# Patient Record
Sex: Female | Born: 1935 | Race: White | Hispanic: No | Smoking: Former smoker
Health system: Southern US, Community
[De-identification: ages and names within clinical notes are randomized; demographics above are authoritative.]

## PROBLEM LIST (undated history)

## (undated) DIAGNOSIS — C801 Malignant (primary) neoplasm, unspecified: Secondary | ICD-10-CM

## (undated) DIAGNOSIS — M1990 Unspecified osteoarthritis, unspecified site: Secondary | ICD-10-CM

## (undated) DIAGNOSIS — K219 Gastro-esophageal reflux disease without esophagitis: Secondary | ICD-10-CM

## (undated) DIAGNOSIS — J449 Chronic obstructive pulmonary disease, unspecified: Secondary | ICD-10-CM

## (undated) DIAGNOSIS — T4145XA Adverse effect of unspecified anesthetic, initial encounter: Secondary | ICD-10-CM

## (undated) DIAGNOSIS — D649 Anemia, unspecified: Secondary | ICD-10-CM

## (undated) DIAGNOSIS — N390 Urinary tract infection, site not specified: Secondary | ICD-10-CM

## (undated) DIAGNOSIS — R51 Headache: Secondary | ICD-10-CM

## (undated) DIAGNOSIS — G709 Myoneural disorder, unspecified: Secondary | ICD-10-CM

## (undated) DIAGNOSIS — IMO0001 Reserved for inherently not codable concepts without codable children: Secondary | ICD-10-CM

## (undated) DIAGNOSIS — F329 Major depressive disorder, single episode, unspecified: Secondary | ICD-10-CM

## (undated) DIAGNOSIS — E049 Nontoxic goiter, unspecified: Secondary | ICD-10-CM

## (undated) DIAGNOSIS — K529 Noninfective gastroenteritis and colitis, unspecified: Secondary | ICD-10-CM

## (undated) DIAGNOSIS — G629 Polyneuropathy, unspecified: Secondary | ICD-10-CM

## (undated) DIAGNOSIS — F32A Depression, unspecified: Secondary | ICD-10-CM

## (undated) DIAGNOSIS — Z9289 Personal history of other medical treatment: Secondary | ICD-10-CM

## (undated) DIAGNOSIS — I10 Essential (primary) hypertension: Secondary | ICD-10-CM

## (undated) DIAGNOSIS — T8859XA Other complications of anesthesia, initial encounter: Secondary | ICD-10-CM

## (undated) HISTORY — PX: JOINT REPLACEMENT: SHX530

## (undated) HISTORY — PX: OTHER SURGICAL HISTORY: SHX169

## (undated) HISTORY — DX: Malignant (primary) neoplasm, unspecified: C80.1

## (undated) HISTORY — PX: APPENDECTOMY: SHX54

## (undated) HISTORY — PX: BLADDER SURGERY: SHX569

## (undated) HISTORY — PX: ABDOMINAL HYSTERECTOMY: SHX81

## (undated) HISTORY — PX: TOTAL KNEE ARTHROPLASTY: SHX125

---

## 1999-01-31 ENCOUNTER — Encounter: Payer: Self-pay | Admitting: Urology

## 1999-01-31 ENCOUNTER — Encounter: Admission: RE | Admit: 1999-01-31 | Discharge: 1999-01-31 | Payer: Self-pay | Admitting: Urology

## 1999-02-04 ENCOUNTER — Encounter: Admission: RE | Admit: 1999-02-04 | Discharge: 1999-02-04 | Payer: Self-pay | Admitting: Urology

## 1999-02-04 ENCOUNTER — Encounter: Payer: Self-pay | Admitting: Urology

## 1999-04-14 ENCOUNTER — Encounter: Payer: Self-pay | Admitting: Urology

## 1999-04-14 ENCOUNTER — Emergency Department (HOSPITAL_COMMUNITY): Admission: EM | Admit: 1999-04-14 | Discharge: 1999-04-14 | Payer: Self-pay | Admitting: Internal Medicine

## 1999-04-14 ENCOUNTER — Encounter: Payer: Self-pay | Admitting: Internal Medicine

## 1999-04-16 ENCOUNTER — Ambulatory Visit (HOSPITAL_COMMUNITY): Admission: RE | Admit: 1999-04-16 | Discharge: 1999-04-16 | Payer: Self-pay | Admitting: Urology

## 1999-04-16 ENCOUNTER — Encounter: Payer: Self-pay | Admitting: Urology

## 1999-04-18 ENCOUNTER — Encounter (INDEPENDENT_AMBULATORY_CARE_PROVIDER_SITE_OTHER): Payer: Self-pay

## 1999-04-18 ENCOUNTER — Observation Stay (HOSPITAL_COMMUNITY): Admission: RE | Admit: 1999-04-18 | Discharge: 1999-04-20 | Payer: Self-pay | Admitting: Urology

## 1999-05-02 ENCOUNTER — Encounter: Payer: Self-pay | Admitting: Urology

## 1999-05-02 ENCOUNTER — Encounter: Admission: RE | Admit: 1999-05-02 | Discharge: 1999-05-02 | Payer: Self-pay | Admitting: Urology

## 2000-03-13 ENCOUNTER — Ambulatory Visit (HOSPITAL_BASED_OUTPATIENT_CLINIC_OR_DEPARTMENT_OTHER): Admission: RE | Admit: 2000-03-13 | Discharge: 2000-03-13 | Payer: Self-pay | Admitting: Plastic Surgery

## 2000-06-12 ENCOUNTER — Encounter (INDEPENDENT_AMBULATORY_CARE_PROVIDER_SITE_OTHER): Payer: Self-pay | Admitting: *Deleted

## 2000-06-12 ENCOUNTER — Ambulatory Visit (HOSPITAL_BASED_OUTPATIENT_CLINIC_OR_DEPARTMENT_OTHER): Admission: RE | Admit: 2000-06-12 | Discharge: 2000-06-12 | Payer: Self-pay | Admitting: Plastic Surgery

## 2000-11-02 ENCOUNTER — Encounter: Payer: Self-pay | Admitting: Internal Medicine

## 2000-11-02 ENCOUNTER — Encounter: Admission: RE | Admit: 2000-11-02 | Discharge: 2000-11-02 | Payer: Self-pay | Admitting: Internal Medicine

## 2000-11-15 ENCOUNTER — Ambulatory Visit (HOSPITAL_COMMUNITY): Admission: RE | Admit: 2000-11-15 | Discharge: 2000-11-15 | Payer: Self-pay | Admitting: Gastroenterology

## 2000-11-15 ENCOUNTER — Encounter: Payer: Self-pay | Admitting: Gastroenterology

## 2001-03-03 ENCOUNTER — Inpatient Hospital Stay (HOSPITAL_COMMUNITY): Admission: EM | Admit: 2001-03-03 | Discharge: 2001-03-08 | Payer: Self-pay | Admitting: Emergency Medicine

## 2001-05-28 ENCOUNTER — Encounter: Payer: Self-pay | Admitting: Orthopedic Surgery

## 2001-05-28 ENCOUNTER — Encounter: Admission: RE | Admit: 2001-05-28 | Discharge: 2001-05-28 | Payer: Self-pay | Admitting: Orthopedic Surgery

## 2001-07-05 ENCOUNTER — Inpatient Hospital Stay (HOSPITAL_COMMUNITY): Admission: EM | Admit: 2001-07-05 | Discharge: 2001-07-09 | Payer: Self-pay

## 2001-07-22 ENCOUNTER — Encounter: Admission: RE | Admit: 2001-07-22 | Discharge: 2001-07-22 | Payer: Self-pay | Admitting: General Surgery

## 2001-07-22 ENCOUNTER — Encounter: Payer: Self-pay | Admitting: General Surgery

## 2001-07-22 ENCOUNTER — Encounter: Payer: Self-pay | Admitting: Internal Medicine

## 2001-07-22 ENCOUNTER — Encounter: Admission: RE | Admit: 2001-07-22 | Discharge: 2001-07-22 | Payer: Self-pay | Admitting: Internal Medicine

## 2001-08-09 ENCOUNTER — Encounter: Payer: Self-pay | Admitting: Orthopedic Surgery

## 2001-08-09 ENCOUNTER — Encounter: Admission: RE | Admit: 2001-08-09 | Discharge: 2001-08-09 | Payer: Self-pay | Admitting: Orthopedic Surgery

## 2001-11-05 ENCOUNTER — Ambulatory Visit (HOSPITAL_COMMUNITY): Admission: RE | Admit: 2001-11-05 | Discharge: 2001-11-05 | Payer: Self-pay | Admitting: Internal Medicine

## 2001-11-05 ENCOUNTER — Encounter: Payer: Self-pay | Admitting: Internal Medicine

## 2001-11-22 ENCOUNTER — Encounter: Payer: Self-pay | Admitting: Orthopedic Surgery

## 2001-11-22 ENCOUNTER — Encounter: Admission: RE | Admit: 2001-11-22 | Discharge: 2001-11-22 | Payer: Self-pay | Admitting: Orthopedic Surgery

## 2002-02-14 ENCOUNTER — Encounter: Admission: RE | Admit: 2002-02-14 | Discharge: 2002-02-14 | Payer: Self-pay | Admitting: Orthopedic Surgery

## 2002-02-14 ENCOUNTER — Encounter: Payer: Self-pay | Admitting: Orthopedic Surgery

## 2002-03-10 ENCOUNTER — Encounter: Payer: Self-pay | Admitting: Gastroenterology

## 2002-03-10 ENCOUNTER — Encounter: Admission: RE | Admit: 2002-03-10 | Discharge: 2002-03-10 | Payer: Self-pay | Admitting: Gastroenterology

## 2002-03-12 ENCOUNTER — Ambulatory Visit (HOSPITAL_COMMUNITY): Admission: RE | Admit: 2002-03-12 | Discharge: 2002-03-12 | Payer: Self-pay | Admitting: Gastroenterology

## 2002-03-14 ENCOUNTER — Encounter: Payer: Self-pay | Admitting: Gastroenterology

## 2002-03-14 ENCOUNTER — Encounter: Admission: RE | Admit: 2002-03-14 | Discharge: 2002-03-14 | Payer: Self-pay | Admitting: Gastroenterology

## 2002-04-01 ENCOUNTER — Encounter: Payer: Self-pay | Admitting: Orthopedic Surgery

## 2002-04-01 ENCOUNTER — Encounter: Admission: RE | Admit: 2002-04-01 | Discharge: 2002-04-01 | Payer: Self-pay | Admitting: Orthopedic Surgery

## 2002-04-08 ENCOUNTER — Encounter (INDEPENDENT_AMBULATORY_CARE_PROVIDER_SITE_OTHER): Payer: Self-pay | Admitting: *Deleted

## 2002-04-08 ENCOUNTER — Inpatient Hospital Stay (HOSPITAL_COMMUNITY): Admission: RE | Admit: 2002-04-08 | Discharge: 2002-04-18 | Payer: Self-pay | Admitting: General Surgery

## 2002-07-11 ENCOUNTER — Encounter: Payer: Self-pay | Admitting: Orthopedic Surgery

## 2002-07-11 ENCOUNTER — Encounter: Admission: RE | Admit: 2002-07-11 | Discharge: 2002-07-11 | Payer: Self-pay | Admitting: Orthopedic Surgery

## 2002-07-24 ENCOUNTER — Other Ambulatory Visit: Admission: RE | Admit: 2002-07-24 | Discharge: 2002-07-24 | Payer: Self-pay | Admitting: Obstetrics and Gynecology

## 2002-10-15 ENCOUNTER — Encounter: Admission: RE | Admit: 2002-10-15 | Discharge: 2002-10-15 | Payer: Self-pay | Admitting: Orthopedic Surgery

## 2002-10-15 ENCOUNTER — Encounter: Payer: Self-pay | Admitting: Orthopedic Surgery

## 2003-02-10 ENCOUNTER — Encounter: Admission: RE | Admit: 2003-02-10 | Discharge: 2003-02-10 | Payer: Self-pay | Admitting: Internal Medicine

## 2003-02-25 ENCOUNTER — Inpatient Hospital Stay (HOSPITAL_COMMUNITY): Admission: RE | Admit: 2003-02-25 | Discharge: 2003-02-28 | Payer: Self-pay | Admitting: Orthopedic Surgery

## 2003-02-28 ENCOUNTER — Inpatient Hospital Stay (HOSPITAL_COMMUNITY)
Admission: RE | Admit: 2003-02-28 | Discharge: 2003-03-06 | Payer: Self-pay | Admitting: Physical Medicine & Rehabilitation

## 2003-08-12 ENCOUNTER — Encounter: Admission: RE | Admit: 2003-08-12 | Discharge: 2003-08-12 | Payer: Self-pay | Admitting: Internal Medicine

## 2003-09-04 ENCOUNTER — Other Ambulatory Visit: Admission: RE | Admit: 2003-09-04 | Discharge: 2003-09-04 | Payer: Self-pay | Admitting: Obstetrics and Gynecology

## 2003-09-29 ENCOUNTER — Ambulatory Visit (HOSPITAL_COMMUNITY): Admission: RE | Admit: 2003-09-29 | Discharge: 2003-09-29 | Payer: Self-pay | Admitting: Plastic Surgery

## 2003-09-29 ENCOUNTER — Encounter (INDEPENDENT_AMBULATORY_CARE_PROVIDER_SITE_OTHER): Payer: Self-pay | Admitting: Plastic Surgery

## 2003-09-29 ENCOUNTER — Ambulatory Visit (HOSPITAL_BASED_OUTPATIENT_CLINIC_OR_DEPARTMENT_OTHER): Admission: RE | Admit: 2003-09-29 | Discharge: 2003-09-29 | Payer: Self-pay | Admitting: Plastic Surgery

## 2003-10-16 ENCOUNTER — Encounter: Admission: RE | Admit: 2003-10-16 | Discharge: 2003-10-16 | Payer: Self-pay | Admitting: Internal Medicine

## 2003-11-11 ENCOUNTER — Ambulatory Visit: Payer: Self-pay | Admitting: Orthopedic Surgery

## 2003-11-11 ENCOUNTER — Inpatient Hospital Stay (HOSPITAL_COMMUNITY): Admission: RE | Admit: 2003-11-11 | Discharge: 2003-11-14 | Payer: Self-pay | Admitting: Orthopedic Surgery

## 2003-11-14 ENCOUNTER — Inpatient Hospital Stay (HOSPITAL_COMMUNITY): Admission: RE | Admit: 2003-11-14 | Discharge: 2003-11-19 | Payer: Self-pay | Admitting: Orthopedic Surgery

## 2003-11-14 ENCOUNTER — Ambulatory Visit: Payer: Self-pay | Admitting: Physical Medicine & Rehabilitation

## 2004-02-28 DIAGNOSIS — Z9289 Personal history of other medical treatment: Secondary | ICD-10-CM

## 2004-02-28 HISTORY — DX: Personal history of other medical treatment: Z92.89

## 2004-02-28 HISTORY — PX: COLON SURGERY: SHX602

## 2004-03-07 ENCOUNTER — Encounter: Admission: RE | Admit: 2004-03-07 | Discharge: 2004-03-07 | Payer: Self-pay | Admitting: Obstetrics and Gynecology

## 2004-09-14 ENCOUNTER — Other Ambulatory Visit: Admission: RE | Admit: 2004-09-14 | Discharge: 2004-09-14 | Payer: Self-pay | Admitting: Obstetrics and Gynecology

## 2005-03-31 IMAGING — CT CT PELVIS W/ CM
1 of 3 series · 14 of 32 positions shown, 19 images · IV contrast (g+ & 100 mlomni 300)
Comparison: none

CLINICAL DATA: Elevated LFTs.  History of total colectomy and hysterectomy.  New diagnosis of melanoma. 
 CT ABDOMEN WITH CONTRAST 
 Multidetector helical scans through the abdomen were performed after oral and IV contrast media were given.  100 cc of Omnipaque 300 were given as the contrast media.  
 The lung bases are clear.  The liver enhances normally with no focal abnormality, and no ductal dilatation is seen.  No calcified gallstones are noted. The pancreas are normal in size, and the peripancreatic fat planes are normal.  The adrenal glands and spleen appear normal.  The kidneys enhance normally, with the right kidney being more ptotic in position.  On delayed images, the pelvocaliceal systems are normal and the proximal ureters are normal in caliber.  Abdominal aorta is normal and no adenopathy is seen.
 IMPRESSION
 Negative CT of the abdomen.  
 CT PELVIS WITH CONTRAST 
 Scans were continued through the pelvis after oral and IV contrast media were given. The urinary bladder is decompressed and unremarkable.  Linear artifacts across the pelvis are created by the left total hip replacement obscuring detail.  The patient has previously   undergone hysterectomy and no adnexal lesion is seen.  No fluid is noted within the pelvis.  There are degenerative changes in the lower lumbar spine. 
 Negative CT of the pelvis.  No acute abnormality.  Left total hip replacement does create linear artifacts across the pelvis.   The patient has undergone prior colectomy.

[Series 2: — · axial · 0.70mm/px · z∈[-327,+13]mm · 14 of 78 slices shown, 19 images]
[im 5/78  soft-tissue]
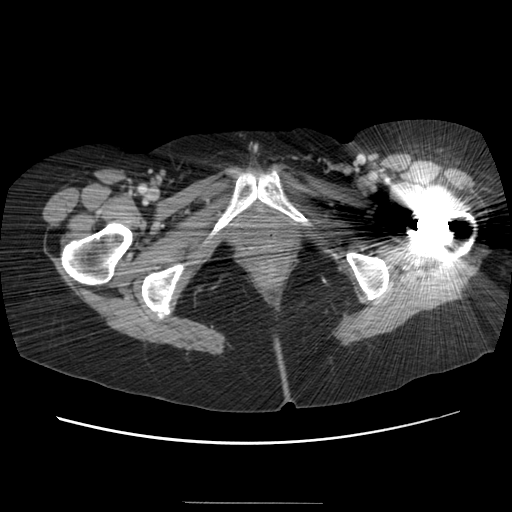
[im 5/78  bone]
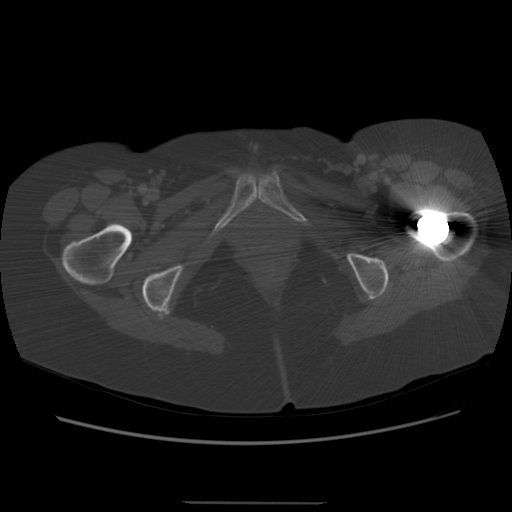
[im 13/78  soft-tissue]
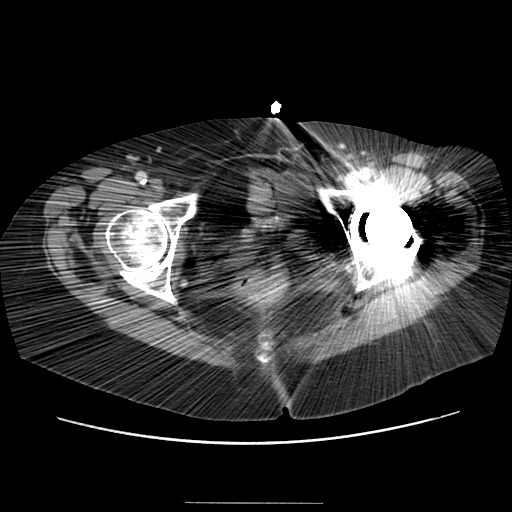
[im 17/78  soft-tissue]
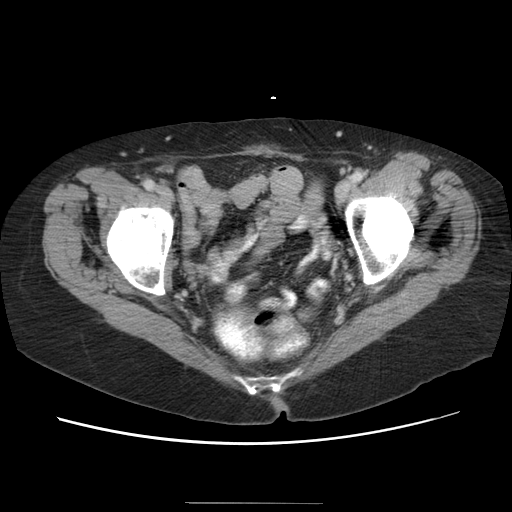
[im 21/78  soft-tissue]
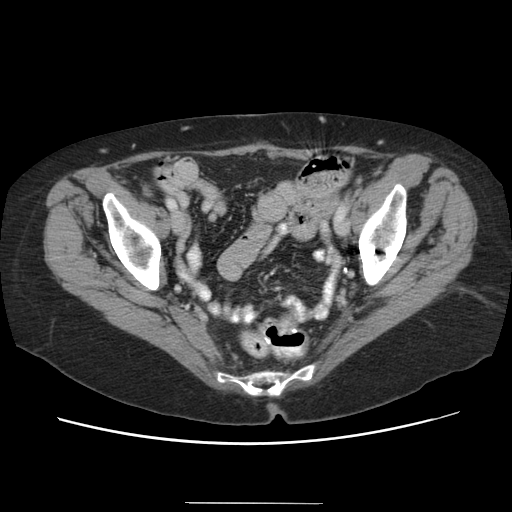
[im 29/78  soft-tissue]
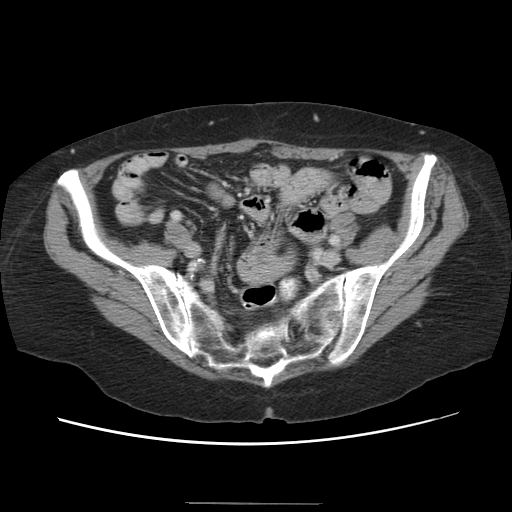
[im 33/78  soft-tissue]
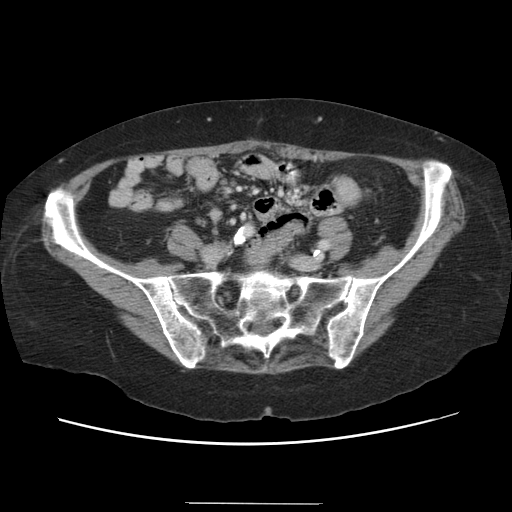
[im 41/78  soft-tissue]
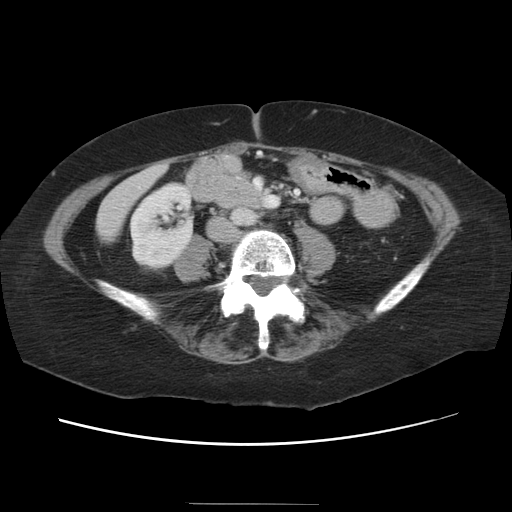
[im 45/78  soft-tissue]
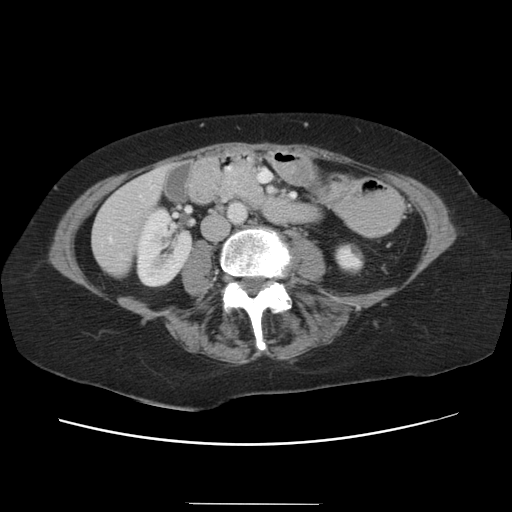
[im 49/78  soft-tissue]
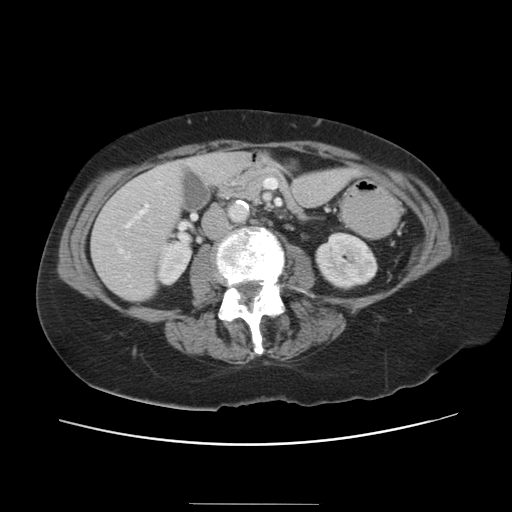
[im 49/78  bone]
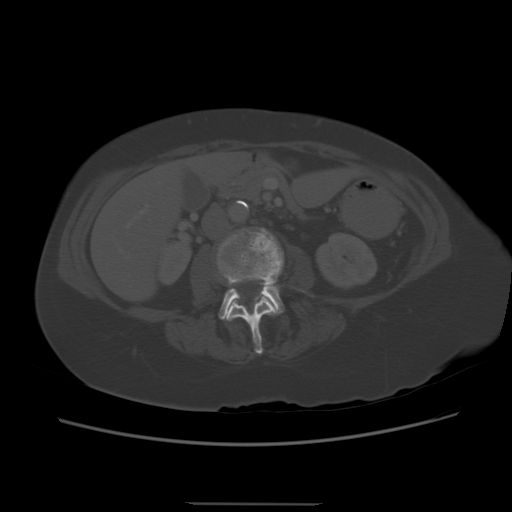
[im 57/78  soft-tissue]
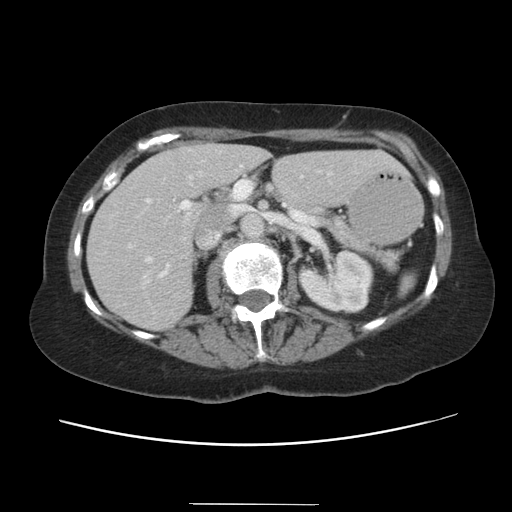
[im 61/78  soft-tissue]
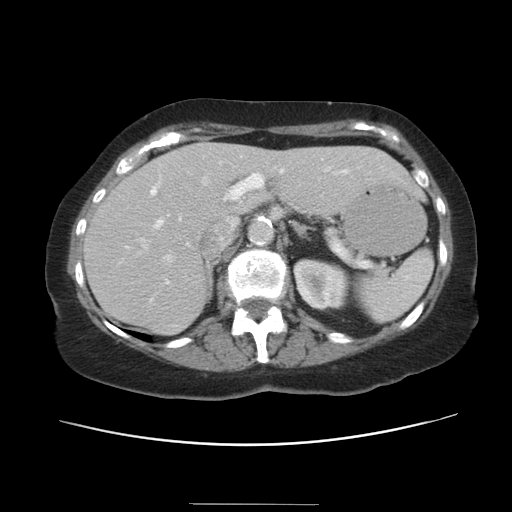
[im 61/78  lung]
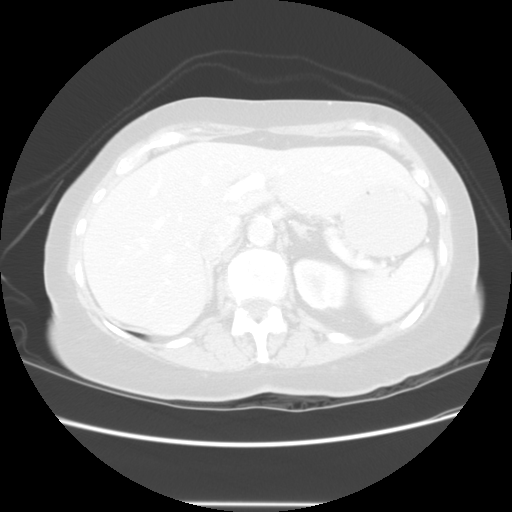
[im 65/78  soft-tissue]
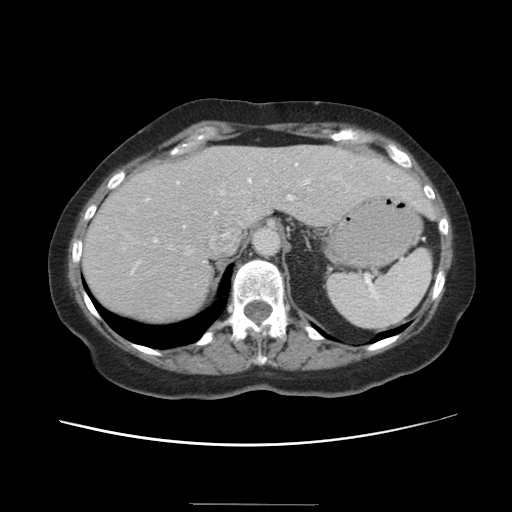
[im 65/78  lung]
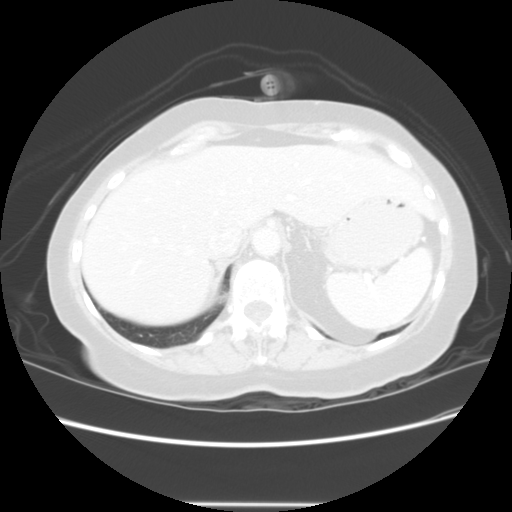
[im 69/78  lung]
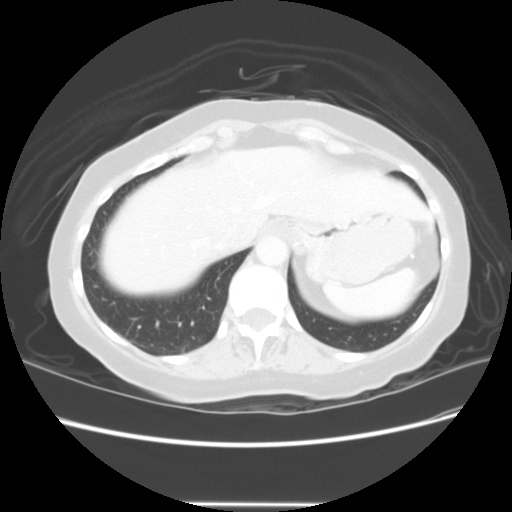
[im 73/78  soft-tissue]
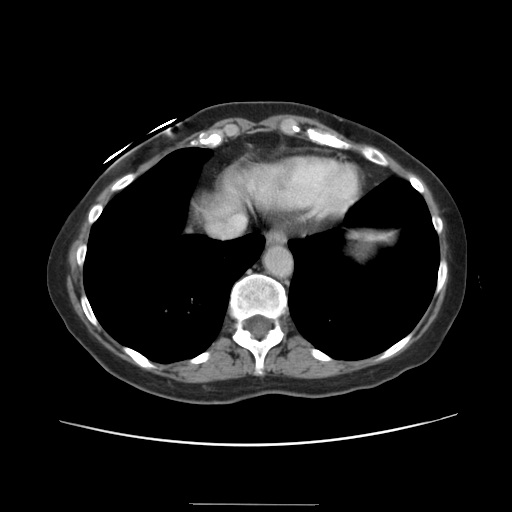
[im 73/78  lung]
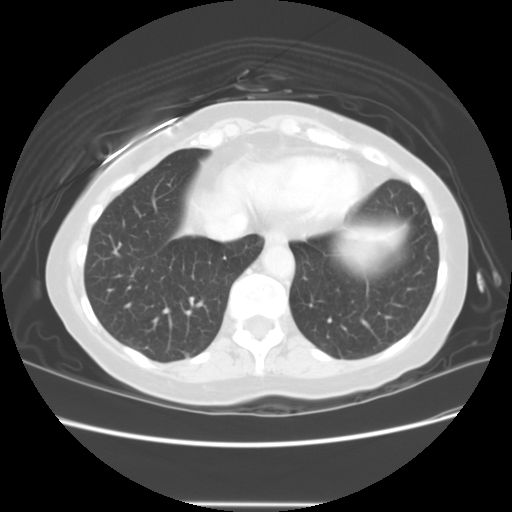

[14 of 32 positions shown; findings below may reference images not displayed]

## 2005-04-17 ENCOUNTER — Encounter: Admission: RE | Admit: 2005-04-17 | Discharge: 2005-04-17 | Payer: Self-pay | Admitting: Obstetrics and Gynecology

## 2005-04-24 ENCOUNTER — Inpatient Hospital Stay (HOSPITAL_COMMUNITY): Admission: RE | Admit: 2005-04-24 | Discharge: 2005-04-27 | Payer: Self-pay | Admitting: Orthopedic Surgery

## 2005-04-25 ENCOUNTER — Ambulatory Visit: Payer: Self-pay | Admitting: Physical Medicine & Rehabilitation

## 2005-04-27 ENCOUNTER — Ambulatory Visit: Payer: Self-pay | Admitting: Physical Medicine & Rehabilitation

## 2005-04-27 ENCOUNTER — Inpatient Hospital Stay
Admission: RE | Admit: 2005-04-27 | Discharge: 2005-05-05 | Payer: Self-pay | Admitting: Physical Medicine & Rehabilitation

## 2006-04-18 ENCOUNTER — Encounter: Admission: RE | Admit: 2006-04-18 | Discharge: 2006-04-18 | Payer: Self-pay | Admitting: Obstetrics and Gynecology

## 2006-05-02 ENCOUNTER — Encounter: Admission: RE | Admit: 2006-05-02 | Discharge: 2006-05-02 | Payer: Self-pay | Admitting: Obstetrics and Gynecology

## 2006-09-19 ENCOUNTER — Other Ambulatory Visit: Admission: RE | Admit: 2006-09-19 | Discharge: 2006-09-19 | Payer: Self-pay | Admitting: Obstetrics and Gynecology

## 2006-12-05 ENCOUNTER — Encounter: Admission: RE | Admit: 2006-12-05 | Discharge: 2006-12-05 | Payer: Self-pay | Admitting: Allergy and Immunology

## 2007-04-18 ENCOUNTER — Ambulatory Visit: Payer: Self-pay | Admitting: Psychiatry

## 2007-05-01 ENCOUNTER — Ambulatory Visit: Payer: Self-pay | Admitting: Psychiatry

## 2007-05-09 ENCOUNTER — Ambulatory Visit: Payer: Self-pay | Admitting: Psychiatry

## 2007-05-14 ENCOUNTER — Ambulatory Visit: Payer: Self-pay | Admitting: Psychiatry

## 2007-06-03 ENCOUNTER — Ambulatory Visit: Payer: Self-pay | Admitting: Psychiatry

## 2007-06-10 ENCOUNTER — Ambulatory Visit: Payer: Self-pay | Admitting: Psychiatry

## 2007-06-12 ENCOUNTER — Encounter: Admission: RE | Admit: 2007-06-12 | Discharge: 2007-06-12 | Payer: Self-pay | Admitting: Obstetrics and Gynecology

## 2007-06-24 ENCOUNTER — Ambulatory Visit: Payer: Self-pay | Admitting: Psychiatry

## 2007-07-09 ENCOUNTER — Ambulatory Visit: Payer: Self-pay | Admitting: Psychiatry

## 2007-08-01 ENCOUNTER — Ambulatory Visit: Payer: Self-pay | Admitting: Psychiatry

## 2007-10-02 ENCOUNTER — Ambulatory Visit: Payer: Self-pay | Admitting: Psychiatry

## 2007-10-07 ENCOUNTER — Ambulatory Visit (HOSPITAL_COMMUNITY): Admission: RE | Admit: 2007-10-07 | Discharge: 2007-10-07 | Payer: Self-pay | Admitting: Dermatology

## 2007-10-07 ENCOUNTER — Ambulatory Visit (HOSPITAL_COMMUNITY): Admission: RE | Admit: 2007-10-07 | Discharge: 2007-10-07 | Payer: Self-pay | Admitting: Obstetrics and Gynecology

## 2008-06-12 ENCOUNTER — Encounter: Admission: RE | Admit: 2008-06-12 | Discharge: 2008-06-12 | Payer: Self-pay | Admitting: Obstetrics and Gynecology

## 2008-09-24 ENCOUNTER — Other Ambulatory Visit: Admission: RE | Admit: 2008-09-24 | Discharge: 2008-09-24 | Payer: Self-pay | Admitting: Obstetrics and Gynecology

## 2009-03-31 ENCOUNTER — Encounter: Admission: RE | Admit: 2009-03-31 | Discharge: 2009-03-31 | Payer: Self-pay | Admitting: Internal Medicine

## 2009-04-07 ENCOUNTER — Other Ambulatory Visit: Admission: RE | Admit: 2009-04-07 | Discharge: 2009-04-07 | Payer: Self-pay | Admitting: Interventional Radiology

## 2009-04-07 ENCOUNTER — Encounter: Admission: RE | Admit: 2009-04-07 | Discharge: 2009-04-07 | Payer: Self-pay | Admitting: Internal Medicine

## 2009-05-06 ENCOUNTER — Encounter (HOSPITAL_COMMUNITY): Admission: RE | Admit: 2009-05-06 | Discharge: 2009-06-29 | Payer: Self-pay | Admitting: Internal Medicine

## 2009-06-02 ENCOUNTER — Encounter: Admission: RE | Admit: 2009-06-02 | Discharge: 2009-06-02 | Payer: Self-pay | Admitting: Obstetrics and Gynecology

## 2009-06-07 ENCOUNTER — Inpatient Hospital Stay (HOSPITAL_COMMUNITY): Admission: RE | Admit: 2009-06-07 | Discharge: 2009-06-10 | Payer: Self-pay | Admitting: Orthopedic Surgery

## 2010-03-20 ENCOUNTER — Encounter: Payer: Self-pay | Admitting: Obstetrics and Gynecology

## 2010-04-11 ENCOUNTER — Other Ambulatory Visit: Payer: Self-pay | Admitting: Dermatology

## 2010-05-03 ENCOUNTER — Other Ambulatory Visit: Payer: Self-pay | Admitting: Internal Medicine

## 2010-05-03 DIAGNOSIS — Z1239 Encounter for other screening for malignant neoplasm of breast: Secondary | ICD-10-CM

## 2010-05-18 LAB — PROTIME-INR
INR: 1.06 (ref 0.00–1.49)
Prothrombin Time: 13.7 seconds (ref 11.6–15.2)
Prothrombin Time: 17.8 seconds — ABNORMAL HIGH (ref 11.6–15.2)

## 2010-05-18 LAB — BASIC METABOLIC PANEL
BUN: 12 mg/dL (ref 6–23)
CO2: 27 mEq/L (ref 19–32)
CO2: 30 mEq/L (ref 19–32)
Calcium: 8.6 mg/dL (ref 8.4–10.5)
Chloride: 106 mEq/L (ref 96–112)
Chloride: 107 mEq/L (ref 96–112)
Creatinine, Ser: 0.77 mg/dL (ref 0.4–1.2)
Creatinine, Ser: 0.85 mg/dL (ref 0.4–1.2)
GFR calc Af Amer: >60 mL/min (ref >60)
GFR calc non Af Amer: >60 mL/min (ref >60)
GFR calc non Af Amer: >60 mL/min (ref >60)
Glucose, Bld: 113 mg/dL — ABNORMAL HIGH (ref 70–99)
Potassium: 4.2 mEq/L (ref 3.5–5.1)
Sodium: 140 mEq/L (ref 135–145)

## 2010-05-18 LAB — CBC
HCT: 31.5 % — ABNORMAL LOW (ref 36.0–46.0)
Hemoglobin: 10.5 g/dL — ABNORMAL LOW (ref 12.0–15.0)
MCHC: 33.5 g/dL (ref 30.0–36.0)
MCHC: 33.8 g/dL (ref 30.0–36.0)
MCV: 93.9 fL (ref 78.0–100.0)
MCV: 94.4 fL (ref 78.0–100.0)
Platelets: 242 10*3/uL (ref 150–400)
RBC: 4.3 MIL/uL (ref 3.87–5.11)
RDW: 13.5 % (ref 11.5–15.5)
RDW: 13.8 % (ref 11.5–15.5)
RDW: 14.2 % (ref 11.5–15.5)
WBC: 8.2 10*3/uL (ref 4.0–10.5)

## 2010-05-18 LAB — URINE CULTURE: Colony Count: 15000

## 2010-05-18 LAB — COMPREHENSIVE METABOLIC PANEL
AST: 20 U/L (ref 0–37)
BUN: 20 mg/dL (ref 6–23)
CO2: 29 mEq/L (ref 19–32)
Chloride: 103 mEq/L (ref 96–112)
Creatinine, Ser: 1.17 mg/dL (ref 0.4–1.2)
GFR calc non Af Amer: 45 mL/min — ABNORMAL LOW (ref >60)
Glucose, Bld: 100 mg/dL — ABNORMAL HIGH (ref 70–99)
Potassium: 4 mEq/L (ref 3.5–5.1)
Sodium: 143 mEq/L (ref 135–145)
Total Protein: 7.8 g/dL (ref 6.0–8.3)

## 2010-05-18 LAB — URINALYSIS, ROUTINE W REFLEX MICROSCOPIC
Bilirubin Urine: NEGATIVE
Bilirubin Urine: NEGATIVE
Glucose, UA: NEGATIVE mg/dL
Ketones, ur: NEGATIVE mg/dL
Nitrite: NEGATIVE
Specific Gravity, Urine: 1.017 (ref 1.005–1.030)
Urobilinogen, UA: 0.2 mg/dL (ref 0.0–1.0)
pH: 7 (ref 5.0–8.0)

## 2010-05-18 LAB — URINE MICROSCOPIC-ADD ON

## 2010-06-02 ENCOUNTER — Other Ambulatory Visit: Payer: Self-pay | Admitting: Dermatology

## 2010-06-06 ENCOUNTER — Ambulatory Visit
Admission: RE | Admit: 2010-06-06 | Discharge: 2010-06-06 | Disposition: A | Discharge status: Home / Self Care | Payer: Medicare Other | Source: Ambulatory Visit | Attending: Internal Medicine | Admitting: Internal Medicine

## 2010-06-06 DIAGNOSIS — Z1239 Encounter for other screening for malignant neoplasm of breast: Secondary | ICD-10-CM

## 2010-07-15 NOTE — Discharge Summary (Signed)
[name], [name]                             ACCOUNT NO.:  [id number]   MEDICAL RECORD NO.:  [id number]                   PATIENT TYPE:  IPS   LOCATION:  4142                                 FACILITY:  [facility]   PHYSICIAN:  Ranelle Oyster, M.D.             DATE OF BIRTH:  04/01/1930   DATE OF ADMISSION:  02/28/2003  DATE OF DISCHARGE:  03/06/2003                                 DISCHARGE SUMMARY   DISCHARGE DIAGNOSIS:  1. Left total hip replacement secondary to osteoarthritis.  2. Deep vein thrombosis prophylaxis.  3. __________  total cholesterol.  4. Hypokalemia, resolved.  5. __________ .  6. Hypertension.  7. __________   HISTORY OF PRESENT ILLNESS:  The patient is a 77 year old white female with  past medical history of hypertension, admitted on December 29 for left total  hip replacement secondary to OA, which was nonresponsive to conservative  care.  A total hip replacement was performed by Dr. Ollen Gross, and the  patient __________  for DVT prophylaxis.  PT report at this time indicates  that the patient is ambulating 50% partial weightbearing, four steps,  moderate assist for transfers and bed mobility moderate assist.  Hospital  course with anemia and hypokalemia.  The patient was transferred to the  [facility] rehab department on February 28, 2003.   PAST MEDICAL HISTORY:  As above.  Diverticulosis, diverticulitis, __________  , cystocele.   PAST SURGICAL HISTORY:  Hysterectomy, appendectomy, tonsillectomy, bladder  surgery.   Medications on admission were:  1. Lomotil 2.5.  2. __________  240 mg daily.  3. Calcium.   ALLERGIES:  SULFA and SEPTRA.   PRIMARY CARE PHYSICIAN:  Ike Bene, M.D.   SOCIAL HISTORY:  The patient lives alone in a 1-1/2 story home, which is a  basement, in [place], [place].  Independent prior to admission.  She denies any alcohol.  Quit tobacco 30 years ago.  She divorced, a retired  Runner, broadcasting/film/video.  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  There are 13  steps in house.   FAMILY HISTORY:  Noncontributory.   REVIEW OF SYSTEMS:  Significant for diarrhea, reflux, and incontinence.   HOSPITAL COURSE:  [name] was admitted to the [facility] rehab  department on February 28, 2003, for comprehensive inpatient rehabilitation  and received more than three hours of therapy daily.  Overall [name]  progressed great during her six-day stay in rehab.  She was discharged at a  modified independent level, able to ambulate greater than 100 feet.  The  patient was able to sustain her partial weightbearing.  Surgical incision  healed very well, demonstrated no signs of infection.  The patient remained  on Coumadin as well as Lovenox until INR was therapeutic with DVT  prophylaxis.  No significant bleeding complications noted.  Blood pressure  remained controlled with Cardizem.  Hospital  course significant for muscle  spasms, pain management, hypokalemia.  Initially the patient was on 20 mEq  p.o. daily for hypokalemia.  Potassium level did begin to normalize, and  Cardura was discontinued.  Initially we had problems with patient's pain  medications.  First she was on Vicodin as well as Darvocet and Ultram.  Adjustments are being made accordingly.  Initially the patient was placed on  Ultram only at night.  She preferred to have it at __________ , and she  continued to take Darvocet-N 100 q.6-8h. p.r.n. as well.  The patient had  been having __________  at home by having diarrhea.  She was taking Lomotil  one tablet t.i.d. prior to breakfast, lunch, and dinner, and diarrhea did  improve.  Due to diarrhea, the patient continued to have rectal irritation.  She received Tucks Pad as needed for irritation.  Finally on January 4,  75,  due to still complications of pain management, Darvocet was  discontinued and she was placed back on oxycodone.  The patient's also  Robaxin was discontinued and she received  Flexeril 5 mg t.i.d.  Finally pain  was under better control with oxycodone and she received K-pad in the  posterior of the left thigh as needed for pain.  Once pain was under  control, the patient progressed very well in rehab.  She was discharged at a  modified independent level.  There were no other major issues that occurred  while the patient was in rehab.   Latest labs:  The latest hemoglobin was 10.8, hematocrit 32.4, white blood  cell count 7.0, platelet count 340.  Latest INR was 2.4.  Sodium 138,  potassium 4.3, chloride 103, CO2 29, glucose 101, BUN 19, creatinine 0.7.  AST 24, ALT 23.  She had a urine culture performed on March 01, 2003.  The  urine grew 40,000 colonies Staph species.  At the time of discharge the  incision showed no signs of infection.  All vitals were stable.  PT report  indicated the patient able to ambulate 150 feet modified independent with  rolling walker, able to transfer sit to stand modified independent, bed  mobility modified independent, able to perform all ADLs at the modified  independent level.  All in all, she made all goals.  She was discharged from  rehab at a modified independent level.   DISCHARGE MEDICATIONS:  1. Resume Lomotil.  2. __________  240 mg in the a.m.  3. Flexeril 5 mg three times daily as needed.  4. Coumadin one-half p.o. in the evening until January 29.  5. Oxycodone 5-10 mg one to two tablets every four to six hours as needed.  6. Multivitamin daily.  7. No aspirin, ibuprofen, Aleve while on Coumadin.   PAIN MANAGEMENT:  Ultram and oxycodone.   She is partial weightbearing on left hip with safety precautions.  She is to  use walker.  No driving.  No smoking or drinking alcohol.  She is to follow  up with Dr. Lequita Halt in two weeks, call for an appointment.  Follow up with  Dr. Riley Kill as needed.  Follow up with primary care [name] in four to six  weeks to check hemoglobin.     Drucilla Schmidt, P.A.                          Ranelle Oyster, M.D.    LB/MEDQ  D:  03/30/2003  T:  03/30/2003  Job:  [id number]   cc:   Ollen Gross, M.D.  [facility]  [address]  [address]  [place]  [place] [address]  Fax: [phone]   Ike Bene, M.D.  [address]  [place]  [place] [address]  Fax: [phone]

## 2010-07-15 NOTE — Discharge Summary (Signed)
[facility]  Patient:    [name], [name] Visit Number: [id number] MRN: [id number]          Service Type: MED Location: [id number] Attending Physician:  Charna Elizabeth Dictated by:   Anselmo Rod, M.D. Admit Date:  07/04/2001 Discharge Date: 07/09/2001   CC:         Sigmund I. Patsi Sears, M.D.  Adolph Pollack, M.D.  Barbette Hair. Vaughan Basta., M.D.   Discharge Summary  DATE OF BIRTH:  12/15/1930  ADMISSION DIAGNOSES: 1. Rectal bleeding, possibly recurrent diverticular bleed. 2. Long-standing history of diverticulosis with multiple bouts of    diverticulitis. 3. History of chronic constipation. 4. Gastroesophageal reflux disease. 5. Recurrent urinary tract infection. 6. Question of fistulous disease associated with diverticulosis. 7. Arthritis. 8. Hypertension. 9. Seasonal allergies.  DISCHARGE DIAGNOSES: 1. Rectal bleeding, possibly recurrent diverticular bleed. 2. Long-standing history of diverticulosis with multiple bouts of    diverticulitis. 3. History of chronic constipation. 4. Gastroesophageal reflux disease. 5. Recurrent urinary tract infection. 6. Question of fistulous disease associated with diverticulosis. 7. Arthritis. 8. Hypertension. 9. Seasonal allergies.  HOSPITAL COURSE: The patient is a 77 year old white female who has been followed by me on an outpatient basis and was admitted to [facility] for the second time this year on Jul 05, 2001, with recurrent GI bleeding. She has a history of extensive diverticulosis diagnosed by a flexible sigmoidoscopy and a barium enema done in the recent past. In January of this year she had a hospitalization for a similar complaint. She was in her  usual state of health until Jul 03, 2001, when she passed some blood at home. She was seen in the office and she came and the bleeding had stopped, but when she went home the bleeding started again. The patient was advised  to come to the emergency room for a 24-hour admission and possible extension of her stay if her symptoms did not improve.  The patient had no fever or chills, rigors, nausea or vomiting or abdominal pain with her symptoms. She has had a problem with chronic constipation and has tried  multiple remedies, none of which have helped. She is afraid to eat more fiber because she feels this will worsen her diverticular disease and cause recurrent problems. She has also had a UTI which was treated by Dr. Patsi Sears, and fistulous disease has been suspected but has not been demonstrated by any radiological method. The patient has no other genitourinary complaints. Her appetite has been fair. Her weight has been stable.  ALLERGIES:  SULFA.  HOME MEDICATIONS 1. Vioxx. 2. Flonase. 3. Ogen. 4. Diltiazem.  SOCIAL HISTORY: She is divorced and has one grown son.  She lives alone in [place]. She moved to [place] from [place] to be with her friend. She denies the use of tobacco or illicit drugs. She drinks an occasional alcoholic beverage.  FAMILY HISTORY: Her mother died of an MI at 74. Her father died of a stroke at 41. Her maternal aunt had breast cancer. No known family history of colon cancer.  HOSPITAL COURSE:  Much against her will, the patient was admitted to the hospital and followed closely with repeat serial CBCs. She did well and did not require a blood transfusion. Protonix was started with regards to her reflux like symptoms. Her initial admission hemoglobin was 12.1 with a BUN of 25 and a creatinine of 0.8. Her hemoglobin then dropped to 10.9 and then 9.7.  The patient was seen by Dr. Claudette Head on May 10 and Jul 07, 2001, in cross cover over the weekend and did well. She was gradually started on a low residue diet.  She was discharged on Jul 09, 2001, when her hemoglobin stabilized at 10.8. The patient did not receive any blood transfusions and was discharged to  home.  DISCHARGE INSTRUCTIONS: 1. Maintain a low residue diet and avoid nuts, seeds and popcorn. 2. Return to the office if there is any history of dizziness, near syncope,    rectal bleeding or melena. 3. Use stool softeners and avoid laxatives as needed for constipation. 4. Liberalized fluid intake. 5. Outpatient follow up within the next seven days. 6. Resume home medications and avoid all nonsteroidals including aspirin. 7. Surgical evaluation with Dr. Avel Peace after discharge. Dictated by:   Anselmo Rod, M.D. Attending Physician:  Charna Elizabeth DD:  07/30/01 TD:  07/31/01 Job: [id number]

## 2010-07-15 NOTE — H&P (Signed)
[facility]  Patient:    [name], [name] Visit Number: [id number] MRN: [id number]          Service Type: EMS Location: ED Attending Physician:  Ilene Qua Dictated by:   Thora Lance, M.D. Admit Date:  03/03/2001                           History and Physical  CHIEF COMPLAINT:              Rectal bleeding.  HISTORY OF PRESENT ILLNESS:   This is a 77 year old white female with a history of hypertension, rhinitis, DJD, diverticulosis, constipation and depression who presents with lower GI bleed.  Prior to Christmas, she developed a rash and diarrhea.  She thought she had diverticulitis and treated herself with five days of Augmentin.  She had some diarrhea while in [place] on December 26 and took some Imodium.  After that, she developed constipation, which has persisted.  Her stools have been hard, pellet-like and difficult to move.  She has been straining quite a bit.  Last night, she had a large meal and, afterwards at 9:00 p.m. while sitting watching TV, she had the sudden onset of bright red blood per rectum mixed with stool.  Throughout the night, she had several large, mainly bloody bowel movements, her last being at 7:20 a.m. this morning.  After that bowel movement, she felt weak and dizzy when standing and sweaty.  She denies any fevers, chills or abdominal pain.  She has no history of any GI bleed in the past.  She did have diverticulitis in the spring of 2002, which was treated at the walk-in clinic on two occasions. She had a colonoscopy to 70 cm by Dr. Loreta Ave in September 2002 which showed extensive left diverticular disease.  As the colonoscopy could not be completed, a barium enema was done and, apparently, showed the same per the patient.  PAST MEDICAL HISTORY:         1. Hypertension, off of medications for several                                  months.                               2. ______ rhinitis and sinusitis.                        3. DJD involving the right knee, left hip,                                  fingers and CMC joints.                               4. Chronic intermittent constipation.                               5. Diverticulosis.                               6. Stress urinary incontinence.  7. History of depression.                               8. History of GERD.                               9. History of dysplastic nevus with moderate to                                  severe atypia excised on three occasions in                                  1999 and 2000.  PAST SURGICAL HISTORY:        1. Total abdominal hysterectomy with bilateral                                  salpingo-oophorectomy and appendectomy for                                  fibroid uterus in November 1977.                               2. Right knee arthroscopy in [place], [place].                               3. Dysplastic nevus with atypia removal, left                                  upper back on three occasions in 1999 and                                  2000 in [place].                               4. Stress urinary incontinence surgery in August                                  2000 by Dr. Christain Sacramento in [place].                               5. Cystourethrocele repair and pubovaginal                                  sling by Dr. Jethro Bolus in February                                  2001.  ALLERGIES:  SULFA.  CURRENT MEDICATIONS:          1. Ogen 0.625 mg q.d.                               2. Vioxx unknown dose q.d.                               3. Flonase, two sprays to each nostril q.d.                               4. Allegra 180 mg q.d. p.r.n.                               5. ______ eye drops p.r.n.                               6. Nasal saline irrigation.  FAMILY HISTORY:               Her father died at the age of 59 of a  stroke. He had hypertension and an enlarged heart.  Her mother died at the age of 53 with a history of CHF, coronary artery disease and, later in life, hypertension, osteoporosis and hypothyroidism.  No siblings.  One son with good health.  SOCIAL HISTORY:               No smoking.  Occasional alcohol.  She lives by herself in [place].  She moved from [place] in 2001.  Divorced.  One son who lives in [place].  REVIEW OF SYSTEMS:            As above.  PHYSICAL EXAMINATION:  GENERAL:                      She appears comfortable lying in the ER.  VITAL SIGNS:                  Blood pressure 123/82.  Orthostatics 146/71 with with heart rate 76 supine, 101/70 with heart rate 110 standing.  Respirations 16, temperature 97.5.  HEENT:                        Pupils are equal, round and respond to light. Extraocular movements intact.  Ears and tympanic membranes are clear. Oropharynx clear.  NECK:                         Supple.  No lymphadenopathy.  No carotid bruits. No thyromegaly.  LUNGS:                        Clear.  HEART:                        Regular rate and rhythm without murmurs, rubs, or gallops.  ABDOMEN:                      Soft and nontender.  No mass or hepatosplenomegaly.  GYN:  Deferred.  RECTAL:                       Gross blood per ER physician.  EXTREMITIES:                  No edema.  NEUROLOGIC:                   Nonfocal.  LABORATORY DATA:              Sodium 134, potassium 3.7, chloride 102, bicarbonate 28, glucose 97, BUN 24, creatinine 0.8, total bilirubin 0.4, alkaline phosphatase 64, SGOT 16, SGPT 17, total protein 6.5, albumin 3.5, calcium 8.9.  PT 12.7, PTT 28.  CBC showed white blood cell count 6.5, hemoglobin 11.4, platelet count 415.  ASSESSMENT:                   Lower gastrointestinal bleeding, likely diverticular.  She has evidence of orthostatic hypotension.  She appears to have stopped bleeding approximately  ten hours ago.  PLAN:                         Admit.  IV fluids.  Clear liquids.  Follow  hemoglobin.  Check C. difficile antigen (recent antibiotics). Dictated by:   Thora Lance, M.D. Attending Physician:  Ilene Qua DD:  03/03/01 TD:  03/03/01 Job: [id number]

## 2010-07-15 NOTE — Op Note (Signed)
[name], [name]                             ACCOUNT NO.:  [id number]   MEDICAL RECORD NO.:  [id number]                   PATIENT TYPE:  INP   LOCATION:  0012                                 FACILITY:  [facility]   PHYSICIAN:  Ollen Gross, M.D.                 DATE OF BIRTH:  1931/02/10   DATE OF PROCEDURE:  02/25/2003  DATE OF DISCHARGE:                                 OPERATIVE REPORT   PREOPERATIVE DIAGNOSIS:  Osteoarthritis, left hip.   POSTOPERATIVE DIAGNOSIS:  Osteoarthritis, left hip.   PROCEDURE:  Left total hip arthroplasty.   SURGEON:  Gus Rankin. Aluisio, M.D.   ASSISTANT:  Alexzandrew L. Julien Girt, P.A.   ANESTHESIA:  Spinal.   ESTIMATED BLOOD LOSS:  200.   DRAIN:  Hemovac x 1.   COMPLICATIONS:  None.   CONDITION:  Stable to recovery.   BRIEF CLINICAL NOTE:  [name] is a 77 year old female, who has end-stage  osteoarthritis of the left hip with pain refractory to nonoperative  management.  She presents now for a left total hip arthroplasty.   PROCEDURE IN DETAIL:  After the successful administration of spinal  anesthetic, the patient is placed in the right lateral decubitus position  with the left side up and held with the hip positioner.  The left lower  extremity is isolated from her perineum with plastic drapes and prepped and  draped in the usual sterile fashion.  Mini posterolateral incision is made  with a 10 blade through subcutaneous tissue to the level of the fascia lata  which is incised in line with the skin incision.  Sciatic nerve is palpated  and protected, and the short external rotator is isolated off the femur.  Capsulectomy is performed and the hip dislocated.  Center of femoral head is  marked and a trial prosthesis is placed such that the center of the trial  head corresponds to the center of her native femoral head.  Osteotomy line  is marked on the femoral neck and osteotomy is made with an oscillating saw.  Femur is then retracted  anteriorly and acetabular exposure obtained.   Acetaminophen retractors are placed, and then labrum and osteophytes are  removed.  Reaming starts at 47 mm, increasing increments of 2 to 51 and then  a 52 mm Pinnacle acetabular shell is placed in anatomic position and  transfixed with two dome screws.  Trial 32 mm neutral liner is placed.   The femur is prepared, first with the canal finder and then irrigation.  I  broached up to size 1.  This had a good fit, and I placed a size 2 high-  offset neck with a 32+1 head.  I reduced the hip, and there was outstanding  stability, full extension, full external rotation, 70 degrees flexion, 90  degrees internal rotation, and 40 degrees adduction.  She also had 90  degrees flexion, 70  degrees internal rotation.  I could flex her all the way  up to 120 without any instability.  The left leg is placed on top of the  right, and both legs are equal in length.  Hip is then dislocated and all  trials removed.  The permanent apex hole eliminator is placed into the  acetabular shell, and the permanent 32 mm neutral Marathon liner is placed.  A sponge is placed in the acetabulum to prevent the cement from getting in.  The cement restrictor, size 3 is the most appropriate, and that is placed at  the appropriate depth in the femoral canal.  The canal is then irrigated  with pulsatile lavage and cement mixed.  Once we were ready for  implantation, the cement is injected into the canal and pressurized.  The  size 2 Durance high-offset stem is then placed into the femoral canal, and  anteversion matches her native version.  Once the cement is full hardened,  then the permanent 32 plus 1 head is placed, and the hip is reduced with the  same stability parameters.  The wound is then copiously irrigated with  antibiotic solution, and short rotator is reattached to the femur through  drill holes.  Fascia lata is closed over a Hemovac drain with interrupted #1   Vicryl, subcu closed with interrupted 2-0 Vicryl and subcuticular running 4-  0 Monocryl.  Incision is cleaned and dried and Steri-Strips and a bulky  sterile dressing applied.  She is then awakened and transported to recovery  in stable condition.                                               Ollen Gross, M.D.    FA/MEDQ  D:  02/25/2003  T:  02/25/2003  Job:  [id number]

## 2010-07-15 NOTE — Discharge Summary (Signed)
[name], [name]                   ACCOUNT NO.:  [id number]   MEDICAL RECORD NO.:  [id number]          PATIENT TYPE:  ORB   LOCATION:  4531                         FACILITY:  [facility]   PHYSICIAN:  Ellwood Dense, M.D.   DATE OF BIRTH:  [birth year]   DATE OF ADMISSION:  04/27/2005  DATE OF DISCHARGE:  05/05/2005                                 DISCHARGE SUMMARY   DISCHARGE DIAGNOSES:  1.  Right knee osteoarthritis.  2.  Hyperkalemia, resolved off triamterene.  3.  Hypertension.  4.  Peripheral neuropathy.  5.  Chronic diarrhea secondary to colon surgery.   HISTORY OF PRESENT ILLNESS:  [name] is a 77 year old female with a  history of DJD with left total hip replacement and left total knee  replacement in the past, now with right knee pain secondary to OA and  failure of conservative therapy.  The patient elected to undergo right total  knee replacement on February 26th by Dr. Lequita Halt. Postoperatively she was  weightbearing as tolerated and on Coumadin for DVT prophylaxis. The patient  has had issues with dyspepsia post surgery and also noted to have fever with  a T-max of 101 and was started on Cipro for questionable UTI. Also noted to  have issues with hypokalemia that is being supplemented. Therapy was  initiated and the patient was noted to have impairments in self-care and  mobility. Subacute consulted for further therapy.   PAST MEDICAL HISTORY:  1.  THP and left total knee replacement in September 2005.  2.  Left total hip replacement.  3.  Cystocele repair with pubovaginal sling.  4.  Peripheral neuropathy.  5.  Diverticulosis requiring colon resection.  6.  Recent left wrist laceration.  7.  Recent UTI.  8.  Chronic UTI.   ALLERGIES:  SEPTRA, SULFA, and AUGMENTIN.   FAMILY HISTORY:  Positive for CVA, coronary artery disease, and cancer.   SOCIAL HISTORY:  The patient lives alone in a one and a half level home with  three steps at entry. Does not use any tobacco.  Uses alcohol rarely. Was  independent with walker prior to admission. Still drives.   HOSPITAL COURSE:  [name] was admitted to subacute on April 27, 2005,  for SACU level therapy to consist of PT and OT daily.  Post admission the  patient was maintained on Coumadin for DVT prophylaxis, subtherapeutic on  admission at 1.4 and subcu Lovenox added until INR on a therapeutic basis.  The patient was noted to be hypokalemic at the time of admission. This was  in part secondary to triamterene that was in place of trimethoprim. This was  discontinued and hypokalemia has been followed along. Labs done post  admission revealed a hemoglobin of 10.8, hematocrit 32.0, white blood cell  count 8.2, platelet count 359.000. Check of lytes revealed sodium of 138,  potassium 4.2, chloride 104, CO2 29, BUN 10, creatinine 0.9, and glucose of  113. UA/UC was rechecked and this was noted to be clear, urine culture  negative. Therefore, Cipro was discontinued. The patient has had issues  with  diarrhea throughout her stay. This was chronic and Lomotil was used to help  assist with this. The patient has also had problems with increased dyspepsia  and Protonix was used with improvement in patient's symptomatology. Also ID  consult was obtained and the patient was instructed on low residue diet to  help with GI symptomatology and this has helped greatly. The patient's right  knee incision has  monitored along and this was noted to have minimal edema.  The patient has healed well without any signs or symptoms of infection. The  patient's pain control was reasonable with the use of p.r.n. medications.  During her stay in subacute the patient was able to progress along to  modified independent level. She was at modified independent for ambulating  250 feet with a rolling walker, requires minimal to guard assistance for  ambulating 100 to 300 feet without assistive device. She was at close  supervision for  navigating stairs. The patient is at modified independent  level for self care. Further follow-up therapy includes home health PT/OT  with [facility] services. Home health RN has been arranged for pro  time draws. On May 05, 2005, the patient was discharged to home.   DISCHARGE MEDICATIONS:  1.  Cardizem CD 240 mg daily.  2.  Trinsicon one p.o. at lunchtime.  3.  Trimethoprim 100 mg q.h.s.  4.  Lyrica 750 mg b.i.d.  5.  Coumadin, hold for now until INR is supratherapeutic.  6.  Pepcid Complete p.o. b.i.d.  7.  Lomotil one to three with meals.  8.  Nasonex 1 squirt to each nostril at h.s.  9.  Cipro 250 mg b.i.d. times five days.   DIET:  Low residue.   ACTIVITY:  As tolerated with the use of walker. No driving for three to four  weeks. No strenuous activity for three to four weeks.   WOUND CARE:  To keep the area clean and dry; wash it with antibacterial soap  and water.   SPECIAL INSTRUCTIONS:  Simethicone 40-80 mg with meals for bloating. [facility] for PT, OT p.r.n.   FOLLOWUP:  The patient is to follow up with Dr. Lequita Halt for postoperative  check, follow up with Dr. Earl Gala for routine check, follow up with Dr. Lamar Benes as needed.      Greg Cutter, P.A.    ______________________________  Ellwood Dense, M.D.    PP/MEDQ  D:  05/15/2005  T:  05/16/2005  Job:  [id number]   cc:   Theressa Millard, M.D.  Fax: [phone]   Ollen Gross, M.D.  Fax: [phone]

## 2010-07-15 NOTE — H&P (Signed)
[name], [name]                             ACCOUNT NO.:  [id number]   MEDICAL RECORD NO.:  [id number]                   PATIENT TYPE:  INP   LOCATION:  0481                                 FACILITY:  [facility]   PHYSICIAN:  Ollen Gross, M.D.                 DATE OF BIRTH:  1931/02/20   DATE OF ADMISSION:  02/25/2003  DATE OF DISCHARGE:  02/28/2003                                HISTORY & PHYSICAL   CHIEF COMPLAINT:  Left hip pain.   HISTORY OF PRESENT ILLNESS:  The patient is a 77 year old female who  presents now for a left total hip arthroplasty.  She has been seen by Dr.  Lequita Halt for ongoing severe left hip pain.  It has gotten much worse over the  past year and past several months.  It started in the groin and radiating  down the thigh.  She has been told in the past that she would be an  excellent candidate for a total hip arthroplasty.  She is seen by Dr.  Patsi Sears, and he recommend that she come over for an opinion by Dr.  Lequita Halt.  She is seen and evaluated, and found to have severe end-stage  arthritis.  It is felt that she would be an appropriate candidate for total  hip replacement.  Risks and benefits of the procedure were discussed with  the patient, she elected to proceed with surgery.   ALLERGIES:  No known drug allergies.  Intolerant to [facility] which causes  nausea.   CURRENT MEDICATIONS:  1. Lomotil.  2. Cardizem.  3. Tramadol.  4. Trimethoprim.  5. Darvocet.  6. Multivitamin.  7. Calcium.  8. Glucosamine.  She will bring her medications and dosages to the hospital.   PAST MEDICAL HISTORY:  1. Diverticulitis resulting into a total colectomy.  2. History of bladder infections.  3. Hypertension.   PAST SURGICAL HISTORY:  1. Hysterectomy.  2. Appendectomy.  3. Bilateral knee arthroscopies.  4. Bladder surgery x2.  5. Total colectomy.   SOCIAL HISTORY:  Lives alone.  Denies use of tobacco products.  Rare intake  of alcohol.   FAMILY HISTORY:   Mother deceased at age 85 with heart disease, MI, and heart  failure.  Father deceased at age 75 with hypertension and stroke.   REVIEW OF SYSTEMS:  GENERAL:  No fever, chills, night sweats.  NEUROLOGIC:  No seizures, syncope, paralysis.  RESPIRATORY:  She has had a little bit of  post-nasal drip and congestion.  No shortness of breath.  No hemoptysis.  CARDIOVASCULAR:  No chest pain, angina, orthopnea.  GASTROINTESTINAL:  She  does have some diarrhea associated with her colectomy.  No nausea, vomiting,  no blood or mucus in the stool.  GENITOURINARY:  She does have some urinary  frequency and incontinence.  No dysuria or hematuria.  MUSCULOSKELETAL:  Pertinent to the left hip found in the  history of present illness.   PHYSICAL EXAMINATION:  VITAL SIGNS:  Pulse 74, respirations 16, blood  pressure 118/72.  GENERAL:  The patient is a 77 year old female, tall, thin-framed, alert,  oriented, and cooperative and pleasant at time of exam.  Appears to be a  good historian.  HEENT:  Normocephalic, atraumatic.  Pupils round and reactive.  Oropharynx  clear.  She does have a partial upper denture.  NECK:  Supple.  CHEST:  Clear to auscultation anterior and posterior chest walls.  HEART:  Regular rate and rhythm, no murmurs, S1 and S2 noted.  ABDOMEN:  Soft, flat, nontender, bowel sounds are present.  RECTAL:  Not done, not pertinent to present illness.  BREASTS:  Not done, not pertinent to present illness.  GENITALIA:  Not done, not pertinent to present illness.  EXTREMITIES:  Significant to that of the left hip.  She has flexion to about  190 degrees, no internal rotation, only 20 degrees of external rotation, and  20 degrees of abduction.   IMPRESSION:  1. Osteoarthritis, left hip.  2. Hypertension.  3. History of diverticulitis resulting in colectomy.  4. History of bladder infections.   PLAN:  The patient will be admitted to undergo a left total hip  arthroplasty.  Surgery will be  performed by Dr. Ollen Gross.     [name], P.A.              Ollen Gross, M.D.    ALP/MEDQ  D:  03/06/2003  T:  03/06/2003  Job:  [id number]

## 2010-07-15 NOTE — Op Note (Signed)
NAMEKARIME, SCHEUERMANN                             ACCOUNT NO.:  [id number]   MEDICAL RECORD NO.:  [id number]                   PATIENT TYPE:  AMB   LOCATION:  DSC                                  FACILITY:  [facility]   PHYSICIAN:  Consuello Bossier., M.D.         DATE OF BIRTH:  04-Mar-1930   DATE OF PROCEDURE:  09/29/2003  DATE OF DISCHARGE:                                 OPERATIVE REPORT   PREOPERATIVE DIAGNOSIS:  Malignant melanoma, level II, 0.4 mm thickness,  left upper back and shoulder area.   POSTOPERATIVE DIAGNOSIS:  Malignant melanoma, level II, 0.4 mm thickness,  left upper back and shoulder area.   OPERATION:  Closure of 7 cm defect following wide local excision of previous  biopsy site.   SURGEON:  Pleas Patricia, M.D.   ANESTHESIA:  Xylocaine 2% with 1:100,000 epinephrine.   FINDINGS:  The patient had a biopsy of her left posterior shoulder of her  back area showing the above findings for which a wide local excision with  layered closure was performed.   PROCEDURE:  The patient was brought to the operating room and marked off for  a planned elliptical excision at least 1 cm distance from the previous  biopsy site.  She was prepped with Betadine and draped sterilely.  She was  anesthetized with Xylocaine 2% with 1:100,000 epinephrine.  Following the  onset of anesthesia, full thickness excisional biopsy was performed.  Bleeding was controlled with a high cautery unit.  The wound was then closed  with interrupted subcutaneous number of 4-0 Monocryl followed by running  subcuticular number of 4-0 Monocryl with the skin wound further reinforced  with application of Steri-Strips.  A light compression dressing was then  applied.  The patient tolerated the procedure well and will return in the  near future for follow up, before with any problems.                                               Consuello Bossier., M.D.    Tery Sanfilippo  D:  09/29/2003  T:  09/29/2003  Job:   [id number]

## 2010-07-15 NOTE — Op Note (Signed)
NAME:  [name], [name]                             ACCOUNT NO.:  [id number]   MEDICAL RECORD NO.:  [id number]                   PATIENT TYPE:  AMB   LOCATION:  ENDO                                 FACILITY:  [facility]   PHYSICIAN:  Anselmo Rod, M.D.               DATE OF BIRTH:  01/10/1931   DATE OF PROCEDURE:  03/12/2002  DATE OF DISCHARGE:                                 OPERATIVE REPORT   PROCEDURE PERFORMED:  Flexible sigmoidoscopy with attempt to disimpact the  rectosigmoid colon.   ENDOSCOPIST:  Charna Elizabeth, M.D.   INSTRUMENT USED:  Olympus video colonoscope.   INDICATIONS FOR PROCEDURE:  Severe constipation with acute abdominal pain  and fecal impaction not responding to Fleet's enemas done by Dr. Merril Abbe  at the office for over three or four hours yesterday secondary to pain.  Disimpaction is planned under sedation.  The patient also has some  inflammatory changes in the left colon.  There was a palpable mass on  digital examination which was thought to be sigmoid colon on recent CT.   PREPROCEDURE PREPARATION:  Informed consent was procured from the patient.  The patient was fasted for eight hours prior to the procedure.  The risks  and benefits of the procedure were discussed with him in great detail.  The  patient understood possible risk of perforation with insufflation of air in  the colon.   DESCRIPTION OF PROCEDURE:  The patient was placed in left lateral decubitus  position and sedated with 70 mg of Demerol and 7 mg of Versed intravenously.  Once the patient was adequately sedated and maintained on low flow oxygen  and continuous cardiac monitoring, the Olympus video colonoscope was  advanced from the rectum to 30 cm.  There was no stool at all from 0 to 28  cm.  There was a large fecal impaction noted and in spite of using the snare  on several occasions to try to break up the stool, disimpaction was not  possible.  Multiple fluid washes were done gently and air  was insufflated  very slowly to decrease the risk of perforation.  The scope was then  withdrawn and retroflexion was done which revealed no acute abnormalities.  No masses or polyps were seen.  There were a few diverticula seen in the  rectosigmoid colon.  The patient tolerated the procedure well without  complication.   IMPRESSION:  1. Fecal impaction at 30 cm.  Otherwise normal-appearing mucosa up to 28 cm.  2. Few diverticula seen in rectosigmoid colon.  3. No masses or polyps present.   RECOMMENDATIONS:  1. GoLYTELY is being prescribed for the patient, 8 ounces every half hour to     see if this will help with the impaction.  If not, repeat enemas will be     considered.  2. Outpatient follow-up within the next week.  Anselmo Rod, M.D.    JNM/MEDQ  D:  03/12/2002  T:  03/12/2002  Job:  [id number]   cc:   Ike Bene, M.D.  [address]  [place]  [place] [address]  Fax: [phone]   Adolph Pollack, M.D.  [address]  [place]  [place] [address]  Fax: [phone]   Sigmund I. Patsi Sears, M.D.  [address]  [place]  [place] [address]  Fax: [phone]

## 2010-07-15 NOTE — Consult Note (Signed)
[facility]  Patient:    [name], [name] Visit Number: [id number] MRN: [id number]          Service Type: SUR Location: 4W 0480 01 Attending Physician:  Lillia Mountain Dictated by:   Sabino Gasser, M.D. Admit Date:  03/03/2001   CC:         Barbette Hair. Vaughan Basta., M.D.  Anselmo Rod, M.D.   Consultation Report  HISTORY OF PRESENT ILLNESS:  The patient is a 77 year old female who had been seen previously by Dr. Loreta Ave and underwent flexible sigmoidoscopy in late 2002 which revealed diverticulosis. A subsequent barium enema done afterwards revealed only diverticulosis as well. The patient states that she has had a few episodes of abdominal pain that have been diagnosed as diverticulitis, and she has been treated with antibiotics. She went to [place] recently for a holiday and had diarrhea, followed by constipation. She took antibiotics. She took laxatives, and these symptoms recurred. She was home then.  On this past weekend, Saturday night, she noticed the onset of bloody diarrhea. She got light-headed and dizzy on Sunday and came to the emergency room and was admitted. She denies fever, chills, nausea, vomiting, abdominal pain. She does give a history of a lot of indigestion and heartburn. She has never had rectal bleeding in the past, but she is noted to have the diverticular disease as noted previously in September of last year.  PAST MEDICAL HISTORY: 1. Hypertension, and had been on medications for a long time but recently that    has been stopped, and the blood pressure has been maintained without it. 2. Rhinitis. 3. Osteoarthritis. 4. Constipation alternating with diarrhea that has not been diagnosed    as irritable bowel syndrome. 5. GERD as noted. 6. Depression. 7. Dysplastic nevi. 8. Total abdominal hysterectomy. 9. Two bladder tacks.  ALLERGIES:  SULFA.  MEDICATIONS: 1. Generic ogen. 2. Vioxx. 3. Flonase. 4. Allergy  medications. 5. In the last week, she had started on aspirin. Usually she would    take Tylenol at bedtime but ran out and took aspirin instead.  After being admitted to the hospital, she had stopped bleeding. Diet had been advanced, and then she had recurrence of rectal bleeding again last night and into the morning where it was noted that her hematocrit this morning was 18, and she has been given 3 units of blood. The last bowel movement was bloody, and it was at 11 p.m. yesterday, approximately 20 hours ago. At this time, she is resting in bed, somewhat agitated about her liquid diet but appears in no physical distress. She has just received her 3rd unit of blood.  PHYSICAL EXAMINATION:  VITAL SIGNS:  Temperature 97.5, pulse 77, blood pressure 137/81, and these have been quite stable.  HEENT:  Unremarkable. No evidence of jaundice. Thyroid is not enlarged. No bruits are heard.  LUNGS:  Clear.  HEART:  Regular rhythm without murmur, rub or gallop.  ABDOMEN:  Normal bowel sounds.  Nontender.  LABORATORY AND ACCESSORY DATA:  As noted, recent laboratories show hematocrit of 18 earlier today. Clostridium difficile toxin was negative. Yesterday before the bleeding, her hematocrit had been 29. Pro time and PTT done in the last days have both been normal, and a CMP panel other than a BUN of 24, was completely normal.  IMPRESSION:  This appears to be probable lower gastrointestinal bleed, most likely the etiology is diffuse diverticulosis seen in the colon on sigmoidoscopy and barium enema. At this point,  she has not bled for nearly a full day. She is tolerating clear liquids. Would follow her H&H as planned and avoid nonsteroidal anti-inflammatory drugs at this time as they can cause some bleeding from diverticulosis, and would not advance her diet at this point. We will wait until tomorrow at which point we can make some decisions about that, depending on whether she does or does  not bleed and drop a blood count overnight. I would advance her eventually, if possible, to a full liquid diet, and then a low residue diet and leave her on that for a few weeks before advancing her diet subsequently. If she bleeds again acutely, consideration for a bleeding scan during the acute phase to see if localization of bleeding can be fine tuned more than currently. Dictated by:   Sabino Gasser, M.D. Attending Physician:  Lillia Mountain DD:  03/05/01 TD:  03/06/01 Job: [id number]

## 2010-07-15 NOTE — Discharge Summary (Signed)
[facility]  Patient:    [name] Visit Number: [id number] MRN: [id number]          Service Type: SUR Location: 4W 0480 01 Attending Physician:  Lillia Mountain Dictated by:   Barbette Hair Vaughan Basta., M.D. Admit Date:  03/03/2001 Discharge Date: 03/08/2001                             Discharge Summary  DISCHARGE DIAGNOSES: 1. Diverticular hemorrhage. 2. Anemia. 3. Orthostatic hypotension.  HISTORY OF PRESENT ILLNESS:  [name] was admitted after having bright red blood per rectum.  It was felt that this was most likely related to well documented diverticulosis.  She had a colonoscopy done less than five months prior to admission to the hospital.  She had marked diverticulosis without evidence of colonic polyps or malignancy.  The patient was observed, and within 24 hours actually did fairly well.  Her initial CBC showed a hemoglobin of 11.4, within 24 hours it dropped to 9.1.  She had no evidence of persistent bleeding.  Repeat hemoglobin was 9.9.  The decision was made to allow her to eat, and she did so.  Unfortunately, within 12 hours she started having diverticular bleeding again.  Hemoglobin dropped to 6.1.  The patient did not become orthostatic or hypotensive again at that point.  She was initially orthostatic on admission.  She was able to maintain her blood pressure and heart rate.  The decision was made to type and cross and transfuse her 3 units of packed red blood cells.  Repeat hemoglobin that day after the bleeding was 12.5, probably representing a laboratory error.  It should have been around 10, and a repeat after that point in time was about 10.2.  The patient maintained a normal hemoglobin around 10 for the next 48 hours in hospitalization without evidence of recurrent GI bleeding.  She was started on a clear diet, and slowly advanced to a low residue diet.  She was discharged on March 08, 2001, on a low residue diet.   There was also a consultation made with Dr. Sabino Gasser, who agreed with management.  No significant change in treatment.  The patient was to follow up with Dr. Ike Bene after discharge.  DISCHARGE MEDICATIONS: 1. Ogin 0.625 mg p.o. q.d. 2. Flonase 2 sprays to each nostril q.d. 3. Allegra 180 mg p.o. q.d. 4. Pantinil eye drops p.r.n.  She was to hold Vioxx at the time of discharge.  CONDITION ON DISCHARGE:  Improved and good.  FOLLOWUP:  Dr. Ike Bene in one week. Dictated by:   Barbette Hair Vaughan Basta., M.D. Attending Physician:  Lillia Mountain DD:  04/02/01 TD:  04/03/01 Job: [id number]

## 2010-07-15 NOTE — Discharge Summary (Signed)
NAMEEllisa, [name]                   ACCOUNT NO.:  [id number]   MEDICAL RECORD NO.:  [id number]          PATIENT TYPE:  INP   LOCATION:  0484                         FACILITY:  [facility]   PHYSICIAN:  Ollen Gross, M.D.    DATE OF BIRTH:  [birth year]   DATE OF ADMISSION:  11/11/2003  DATE OF DISCHARGE:  11/14/2003                                 DISCHARGE SUMMARY   ADMISSION DIAGNOSES:  1.  Osteoarthritis of the left knee.  2.  History of depression.  3.  Hypertension.  4.  Occasional reflux.  5.  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  6.  History of urinary stress incontinence.  7.  History of urinary tract infections.  8.  Recently diagnosed malignant melanoma.  9.  Osteoarthritis.  10. Postmenopausal.   DISCHARGE DIAGNOSES:  1.  Osteoarthritis left knee with valgus deformity, status post left total      knee arthroplasty.  2.  History of depression.  3.  Hypertension.  4.  Occasional reflux.  5.  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  6.  History of urinary stress incontinence.  7.  History of urinary tract infections.  8.  Recently diagnosed malignant melanoma.  9.  Osteoarthritis.  10. Postmenopausal.   PROCEDURE:  November 11, 2003, left total knee arthroplasty.  Surgeon Dr.  Ollen Gross and assistant [name], P.A.C.  Spinal  anesthesia.  Blood loss minimal.  Hemovac drain x 1.  Tourniquet time 39  minutes at 300 mmHg.   BRIEF HISTORY:  [name] is a 77 year old female with a recent successful hip  replacement.  He comes in secondary to continued pain and severe arthritis  of the left knee.  The pain has been intractable and does not improve with  conservative measures.  Now presents for a total knee arthroplasty.   CONSULTATIONS:  Rehabilitation services.   LABORATORY DATA:  A CBC on admission showed a hemoglobin 13.5, hematocrit  40.8, white cell count 6.4, differential within normal limits.  Postoperative H&H of 13.9 and 41.3.  Last H&H  is 10.1 and 30.6.  PT and PTT  preoperative were 11.9 and 26 respectively.  INR is 0.8.  Serum pro time  fallen with last known PT/INR 17.7 and 1.7.  Chemistry panel on admission  showed a mildly elevated CO2 34, elevated glucose of 130, elevated liver  enzymes.  AST of 105, ALT of 180, and ALP of 126.  Serum BMETs were  followed.  Electrolytes remained within normal limits.  Did have a follow-up  chemistry panel postoperatively to recheck her liver enzymes.  The AST came  down to a normal of 25, AST came down to a normal level of 24, ALT down to a  normal level of 25, ALP down to a normal level of 106.  Preoperative UA  showed a small leukocyte esterase, 0 to 2 white, few bacteria.  Follow-up UA  is negative.  Blood type O positive.   EKG dated October 12, 2003 with normal sinus rhythm with left axis deviation.  No significant change.  A septal infarct, age undetermined.  No significant  change since last tracing performed by Dr. Antionette Char.   HOSPITAL COURSE:  The patient admitted to [facility], taken to the  OR, and underwent the above procedures.  No complications and the patient  tolerated the procedure well.  Taken to the recovery room and then to the  orthopedic floor with continued postoperative care.  Vital signs were  followed.   The patient underwent a rehabilitation consult postoperatively and felt that  she possibly could be a good candidate for possible inpatient stay versus  home.  Had a rough night on the first on the first night of surgery.  The  Hemovac drain, which was placed at the time of surgery, was pulled on day  one.   Started to get up out of bed.  Did sit up in the bed for a long time on day  one.  By day two, she was feeling a little bit better.  She had a fair  amount of pain on the first day of surgery.  Dressing was changed on day two  and the incision was healing well.  No signs of infection.  Foley came out  on the morning of day two.    By day three, she was feeling a little bit better and started to get up with  physical therapy.  She is slowly progressing. It was noted that a bed became  available in the rehabilitation services.   Later on the day on November 14, 2003, the patient was slowly progressing  with physical therapy and was felt to be a good candidate with  rehabilitation services at that time.   PLAN:  1.  The patient was transferred to rehabilitation services.  2.  Discharge diagnoses, please see above.  3.  Discharge medications:  Continue current medications in the hospital as      per [facility].  4.  Diet:  Low sodium diet.  GI diet.  5.  Follow up two weeks from surgery.  Follow from discharge from the      rehabilitation unit.  6.  Activity:  Weightbearing as tolerated.  Total knee protocol.  PT and OT      for continued gait training, ambulation, and ADLs.   DISPOSITION:  To [name].   CONDITION ON DISCHARGE:  Improved.     Alex   ALP/MEDQ  D:  12/15/2003  T:  12/15/2003  Job:  [id number]   cc:   Ollen Gross, M.D.  [facility]  [address]  [address]  [place]  [place] [address]  Fax: [phone]   Ike Bene, M.D.  [address]  [place]  [place] [address]  Fax: [phone]   [place]. Yetta Barre, M.D.  [address] [place]  [place] [address]  Fax: [phone]   Consuello Bossier., M.D.  Fax: [phone]   Erick Colace, M.D.  [address] [place]  [place] [address]  Fax: [phone]

## 2010-07-15 NOTE — H&P (Signed)
NAMEJonella, [name]                   ACCOUNT NO.:  [id number]   MEDICAL RECORD NO.:  [id number]          PATIENT TYPE:  INP   LOCATION:  NA                           FACILITY:  [facility]   PHYSICIAN:  Ollen Gross, M.D.    DATE OF BIRTH:  1930-04-24   DATE OF ADMISSION:  04/24/2005  DATE OF DISCHARGE:                                HISTORY & PHYSICAL   DESCRIPTION OF OFFICE VISIT/HISTORY AND PHYSICAL EXAMINATION:  April 18, 2005.   DATE OF ADMISSION:  April 24, 2005.   CHIEF COMPLAINT:  Right knee pain.   HISTORY OF PRESENT ILLNESS:  The patient is a 77 year old female, well known  to Dr. Ollen Gross, having previously undergone a left total knee  arthroplasty but now presents for continued right knee pain.  This has  progressed to the point where she would like to have something done about  it.  The risks and benefits have been discussed and the patient has  subsequently been admitted to the hospital for total knee replacement.   ALLERGIES:  No known drug allergies. INTOLERANCES:  She has documented  intolerances to CIPRO and SEPTRA which causes nausea.  She also had DILAUDID  in a hospital previous record but she does not recall any specific reaction  with the Dilaudid.   CURRENT MEDICATIONS:  1.  Diltiazem-ER 240 mg tablet daily.  2.  Lonox 2.5 mg one p.o. q.i.d. PRN diarrhea.  3.  Trimethoprim 100 mg p.o. daily.  4.  Tylenol Arthritis PRN.  5.  Pepcid Complete over the counter but she has also used Protonix in the      hospital.   PAST MEDICAL HISTORY:  Diverticulosis, resulting in a total colectomy,  history of bladder infections, intermittent urinary incontinence,  depression, intermittent reflux, persistent diarrhea following total  colectomy, malignant melanoma X1, osteoarthritis, postmenopausal.   PAST SURGICAL HISTORY:  Recent excision of melena, total hysterectomy and  appendectomy in 1997, surgery to correct stress urinary incontinence in 1999  and again  in 2000.  Bilateral knee arthroscopies.  Total colectomy secondary  to diverticulitis.  Left total hip replacement in 2004.  Left total knee  replacement in 2005.   SOCIAL HISTORY:  Divorced, retired Runner, broadcasting/film/video, nonsmoker, occasional intake of  wine, one son.   FAMILY HISTORY:  Mother deceased at age 57 with heart disease, myocardial  infarction and heart failure.  Father deceased age 52 with hypertension and  stroke.   REVIEW OF SYSTEMS:  GENERAL:  No fever, chills, night sweats.  NEUROLOGICAL:  No seizure, syncope or paralysis.  RESPIRATORY:  Occasional difficulty when  allergies.  No shortness of breath or productive cough.  CARDIOVASCULAR:  No  chest pain, angina or orthopnea. GASTROINTESTINAL:  Does have diarrhea  secondary to the total colectomy.  No blood or mucous in the stools.  GENITOURINARY:  She does have some frequency, nocturia and urgency.  History  of stress incontinence.  MUSCULOSKELETAL:  Right knee as above.   PHYSICAL EXAMINATION:  VITAL SIGNS:  Pulse 80, respirations 14, blood  pressure 132/84.  GENERAL APPEARANCE:  77 year old white female, well-nourished, well-  developed, in no acute distress, mildly anxious.  Appears to be a good  historian.  HEENT:  Normocephalic, atraumatic.  Pupils equal, round and reactive.  Extraocular movements intact. Oropharynx clear.  Partial dentures.  NECK:  Supple.  No carotid bruits.  CHEST:  She does have a slight expiratory wheeze in the right base.  This  clears with coughing, otherwise no other adventitious sounds.  Lungs are  otherwise clear.  HEART:  Regular rate and rhythm with no murmurs.  S1 and S2 noted.  ABDOMEN:  Soft, nontender, flat. Bowel sounds present.  RECTAL, BREASTS, GENITALIA:  Not done, not pertinent to present illness.  EXTREMITIES:  Right knee shows pain with palpation of anterior medial and  lateral joint lines, marked crepitus noted on passive range of motion, no  effusion or instability.    IMPRESSION:  1.  Osteoarthritis right knee.  2.  Postmenopausal.  3.  History of malignant melanoma.  4.  Persistent diarrhea following total colectomy.  5.  Intermittent reflux.  6.  History of depression.  7.  Intermittent urinary incontinence.  8.  Hypertension.  9.  History of bladder infection.  10. Past history of diverticulitis resulting in total colectomy.   PLAN:  The patient will be admitted to [facility] to undergo a  right total knee arthroplasty.  She wants to look into rehabilitation again.  She went to rehabilitation with her previous knee surgery.      Alexzandrew L. Julien Girt, P.A.      Ollen Gross, M.D.  Electronically Signed    ALP/MEDQ  D:  04/23/2005  T:  04/24/2005  Job:  [id number]   cc:   [place]. Yetta Barre, M.D.  Fax: [phone]   Consuello Bossier., M.D.  Fax: [phone]   Theressa Millard, M.D.  Fax: [phone]

## 2010-07-15 NOTE — H&P (Signed)
[name], [name]                   ACCOUNT NO.:  [id number]   MEDICAL RECORD NO.:  [id number]          PATIENT TYPE:  ORB   LOCATION:  4531                         FACILITY:  [facility]   PHYSICIAN:  Ellwood Dense, M.D.   DATE OF BIRTH:  02/15/1931   DATE OF ADMISSION:  04/27/2005  DATE OF DISCHARGE:                                HISTORY & PHYSICAL   ORTHOPEDIST:  Ollen Gross, M.D.   PRIMARY CARE PHYSICIAN:  Theressa Millard, M.D.   UROLOGISTLynelle Smoke I. Patsi Sears, M.D.   HISTORY OF PRESENT ILLNESS:  Ms. Lybrand is a 77 year old female with a  history of degenerative joint disease with prior left total hip replacement  and left total knee replacement.   The patient did well after the prior surgeries and has failed conservative  treatment for a right knee pain.   The patient underwent a right total knee replacement on April 24, 2005 by  Dr. Lequita Halt.  She was allowed postoperative weightbearing as tolerated.  The  CPM was started for passive range of motion, and Coumadin was started for  DVT prophylaxis.  She has had some fever with temperature maximum of 101,  and she was started on Cipro for a probable urinary tract infection.  She  had been given triamterene inadvertently in place of her home trimethoprim.  She experienced some frequency secondary to the diuretic, and has been  restarted back on her home trimethoprim.   The patient was evaluated by the rehabilitation physicians and felt to be an  appropriate candidate for inpatient rehabilitation.  She was moved to the  subacute unit for that purpose today.   REVIEW OF SYSTEMS:  1.  Positive for frequency.  2.  Chronic diarrhea.  3.  Reflux.  4.  Depression.  5.  Anxiety.  6.  Bilateral feet burning sensation.  7.  Wound of the right knee.   PAST MEDICAL HISTORY:  1.  Prior total abdominal hysterectomy and bilateral salpingo-oophorectomy.  2.  Left total knee replacement in September of 2005.  3.  Left total hip  replacement.  4.  Cystocele repair with pubovaginal sling.  5.  Peripheral neuropathy.  6.  Diverticulosis with colon resection.  7.  Recent left wrist laceration.  8.  Recent urinary tract infection with history of chronic urinary tract      infections.   FAMILY HISTORY:  Positive for stroke, coronary artery disease and cancer.   SOCIAL HISTORY:  The patient lives alone in a 1-1/2 level home with 3 steps  to enter.  She has a chair lift.  She does not use tobacco and reports rare  alcohol usage.   FUNCTIONAL HISTORY PRIOR TO ADMISSION:  Independent using a walker.   ALLERGIES:  1.  SULFA.  2.  SEPTRA.  3.  AUGMENTIN.   MEDICATIONS PRIOR TO ADMISSION:  1.  Cardizem 240 mg daily.  2.  Trimethoprim 100 mg q.h.s.  3.  __________ 2.5 mg 2-5 times per day.  4.  Lyrica 75 mg b.i.d.  5.  Cipro 500 mg b.i.d.   RECENT LABORATORIES:  Hemoglobin 12.8, hematocrit 38.6, platelet count of  394,000 and white count of 5.5.  Recent hemoglobin was 9.6 and hematocrit  28.7.  Recent sodium was 142, potassium 4.5, chloride 106, CO2 of 30, BUN  20, and creatinine 0.9 with glucose of 92.   HISTORY AND PHYSICAL EXAMINATION:  GENERAL:  Well-appearing fit adult female  laying in bed in no acute discomfort.  HEENT:  Normocephalic and atraumatic.  CARDIOVASCULAR:  Regular rate and rhythm.  S1 and S2 without murmurs.  ABDOMEN:  Soft, nontender with positive bowel sounds.  Nontender to  auscultation bilaterally.  NEUROLOGIC:  Alert and oriented x3.  Cranial nerves II-XII are intact.  Bilateral upper extremity exam showed 4+ to 5-/5 strength throughout.  Bulk  and tone were normal.  Reflexes were 2+ and symmetrical.  Sensation was  intact to light touch in bilateral upper extremities.  Right lower extremity  exam showed a well-healed right knee wound with Steri-strips in place.  No  significant drainage present in the right knee wound.  She has 4-/5 strength  in the right lower extremity and 4+/5  strength in the left lower extremity.  Left hip and left knee wounds showed excellent healing from prior surgeries.   IMPRESSION:  1.  Status post right total knee replacement, postoperative day #4,      weightbearing as tolerated, performed by Dr. Lequita Halt.  2.  The patient has deficits in ADL's, transfers and ambulation secondary to      the right total knee replacement.   PLAN:  1.  Admit to the rehabilitation unit on subacute for daily OT and PT      therapies to advance ADL's, transfers and ambulation.  2.  Twenty-four nursing for medication administration, monitoring of vitals      and wound dressing changes as needed.  3.  Check admission labs including CBC and CMET on Friday, April 28, 2005.  4.  Continue subcutaneous Lovenox 30 mg q.12h. until INR consistently      greater than 2.0.  5.  Continue Pepcid 20 mg b.i.d.  6.  Continue monitoring hypertension on Cardizem 240 mg daily.  7.  Continue Lyrica 75 mg b.i.d. for neuropathy.  8.  Continue Trinsicon one tablet daily for postoperative anemia.  9.  Resume trimethoprim 100 mg q.h.s. for chronic urinary tract infections.  10. Continue Cipro 500 mg b.i.d. for recent urinary tract infection.  11. Dressing changes b.i.d. to the left wrist and clean with normal saline,      then Kerlix wrap.  12. Continue oxycodone 5 mg 1-2 tablets p.o. q.4-6h. p.r.n. for pain.  13. Fleets enema p.r.n. daily for constipation.   PROGNOSIS:  Good.   ESTIMATED LENGTH OF STAY:  Approximately 10 days.   GOALS:  Modified independent, ADL's, transfers and ambulation.           ______________________________  Ellwood Dense, M.D.     DC/MEDQ  D:  04/27/2005  T:  04/27/2005  Job:  [id number]

## 2010-07-15 NOTE — Discharge Summary (Signed)
[name], [name]                             ACCOUNT NO.:  [id number]   MEDICAL RECORD NO.:  [id number]                   PATIENT TYPE:  INP   LOCATION:  0481                                 FACILITY:  [facility]   PHYSICIAN:  Ollen Gross, M.D.                 DATE OF BIRTH:  1931/01/20   DATE OF ADMISSION:  02/25/2003  DATE OF DISCHARGE:  02/28/2003                                 DISCHARGE SUMMARY   ADMISSION DIAGNOSES:  1. Osteoarthritis, left hip.  2. Hypertension.  3. History of diverticulitis resulting in colectomy.  4. History of bladder infections.   DISCHARGE DIAGNOSES:  1. Osteoarthritis left hip status post left total hip arthroplasty.  2. Postoperative hypokalemia improved.  3. Hypertension.  4. History of diverticulitis resulting in colectomy.  5. History of bladder infections.   PROCEDURE:  The patient was taken to the OR on February 25, 2003 and  underwent a left total hip arthroplasty.  Surgeon, Ollen Gross, M.D.;  assistant, Alexzandrew L. Julien Girt, P.A.  Anesthesia spinal, blood loss 200  mL, Hemovac drain x1.   CONSULTATIONS:  Rehabilitation services.   BRIEF HISTORY:  The patient is a 77 year old female with end-stage  osteoarthritis of the left hip, pain has been refractory to nonoperative  management. She now presents for a left total hip arthroplasty.   LABORATORY DATA:  CBC on admission showed a hemoglobin of 12.8, hematocrit  of 37.7, white cell count of 5.9, red cell count 4.10, differential within  normal limits.  Postoperative H&H 10.8 and 32.5, lasted noted H&H 10.3 and  30.6.  PT and PTT preop 12.5 and 32 respectively, INR 0.9, serial pro times  followed.  Last noted PT/INR 17.7 and 1.7.  Chem panel on admission elevated  BUN on admission of 25, glucose 121, serial BMETs are followed.  BUN dropped  back down to normal at 10.  Glucose stayed around 132 and ended up at 124.  Potassium did drop from 4.0 to 3.1 back up to 3.9.  Urinalysis, trace  leukocyte esterase, few epithelial cells, 7-10 white cells, 0-2 red cells  with hyalin cast, trace ketones.  Blood group type O positive.   EKG dated April 04, 2002 normal sinus rhythm, left axis deviation, minimal  voltage criteria for LVH.  Septal infarct age undetermined, no significant  change since last tracing confirmed by Nicki Guadalajara, M.D.  Portal hip and  pelvis films postop anatomic alignment status post left total hip  arthroplasty without acute complicating features.   HOSPITAL COURSE:  The patient was taken to the operating room and  underwent  the above stated procedure without operative complications on the date of  admission, tolerated the procedure well and later transferred to the  recovery room and then to the orthopedic floor to continue postoperative  care.  Vital signs were followed.  Hemovac drain placed at time of surgery.  The  patient was given 24 hours of postop IV antibiotics in the form of  Ancef, placed on Coumadin protocol PCA and p.o. analgesics were used for  pain control.  PT and OT consulted. Also consulted rehab postop for possible  inpatient care.  She was placed partial weight bearing 50% to the left lower  extremity.  On day one, Hemovac drain was pulled. She did have a little bit  of pain.  She started to get up out of bed with physical therapy. She was  evaluated by rehab services who felt she would be appropriate for inpatient  or SACU  stay.  They followed along during the hospital course.  By day two,  she is doing a little bit better, getting it more with therapy.  Her  potassium had dropped down to 3.1, she was placed on potassium supplements,  responded well.  The potassium came back up to 3.9.  Dressing change  initiated on postoperative day two and the incision was healing well. It was  noted that on the following morning, a bed became available while on the  rehab unit. The patient was doing well, been seen in rounds by Dr. Lequita Halt  and  it was decided she would be transferred to rehab.   PLAN:  1. Patient transferred to rehab February 28, 2003.  2. Discharge diagnoses please see above.  3. Discharge medications:  Percocet for pain, Robaxin for spasms. Continue     current medications as per the [facility] will be sent over with the patient.  4. Diet as tolerated.  5. Activity:  She is 50% partial weight bearing to the left lower extremity.     Continue with gait training ambulation as per PT and OT for ADL's.     Followup two weeks from surgery.   DISPOSITION:  [facility].   CONDITION ON DISCHARGE:  Improved.     Alexzandrew L. Julien Girt, P.A.              Ollen Gross, M.D.    ALP/MEDQ  D:  03/26/2003  T:  03/26/2003  Job:  [id number]

## 2010-07-15 NOTE — Discharge Summary (Signed)
NAMEMATTHEW, PAIS                             ACCOUNT NO.:  [id number]   MEDICAL RECORD NO.:  [id number]                   PATIENT TYPE:  INP   LOCATION:  0379                                 FACILITY:  [facility]   PHYSICIAN:  Adolph Pollack, M.D.            DATE OF BIRTH:  12-Dec-1930   DATE OF ADMISSION:  04/08/2002  DATE OF DISCHARGE:  04/18/2002                                 DISCHARGE SUMMARY   PRINCIPAL DISCHARGE DIAGNOSIS:  Diverticular bleeding with diverticulitis.   SECONDARY DIAGNOSES:  1. Hypertension.  2. Hypokalemia.  3. Postoperative ileus.   PROCEDURE:  Total abdominal colectomy.   REASON FOR ADMISSION:  The patient is a 77 year old female who has had  complicated diverticular disease, including diverticular bleeds as well as  diverticulitis.  She initially deferred an operation back in the spring of  2003, but because of continued complications, is now admitted electively for  the above procedure.   HOSPITAL COURSE:  She underwent the above procedure.  Postoperatively, she  had a slow, but progressive course.  She was given p.r.n. labetalol for  hypertension and put on a Catapres patch.  She had some hypokalemia and a  postoperative ileus.  She had some peri-incisional ecchymosis and somewhat  of an intermittent fever.  Fever workup was done, but no obvious source was  found.  I had Dr. Merril Abbe see her because we had trouble with her  hypertension, and he was able to keep her on her Diltiazem and started her  on an ACE inhibitor which seemed to control her blood pressure well.  She  did have some diarrhea, but this also decreased, and once this did the fever  improved.  C. diff was negative.  By her tenth postoperative day, she had  some lose bowel movements, but they were more manageable.  She felt much  better.  The wound was clean.  Hypertension was under good control, and she  was ready for discharge.   DISPOSITION:  Discharged to home on 04/18/02, in  satisfactory condition.   DISCHARGE MEDICATIONS:  1. She had some pain pills at home which she preferred to take.  2. Altace for hypertension.  3. Diltiazem for hypertension.    ACTIVITY:  She is given strict activity restrictions.   FOLLOWUP:  She will see me back in the office in two weeks.                                               Adolph Pollack, M.D.    Kari Baars  D:  04/28/2002  T:  04/28/2002  Job:  [id number]   cc:   Anselmo Rod, M.D.  [address]  [address]  [place]  [place] [address]  Fax: [phone]  Ike Bene, M.D.  [address]  [place]  [place] [address]  Fax: [phone]

## 2010-07-15 NOTE — Discharge Summary (Signed)
NAMEShauniece, [name]                   ACCOUNT NO.:  [id number]   MEDICAL RECORD NO.:  [id number]          PATIENT TYPE:  ORB   LOCATION:  4531                         FACILITY:  [facility]   PHYSICIAN:  Ollen Gross, M.D.    DATE OF BIRTH:  29-Jun-1930   DATE OF ADMISSION:  04/27/2005  DATE OF DISCHARGE:  05/05/2005                                 DISCHARGE SUMMARY   ADMISSION DIAGNOSES:  1.  Osteoarthritis, right knee.  2.  Postmenopausal.  3.  History of malignant melanoma.  4.  Persistent diarrhea following total colectomy.  5.  Intermittent reflux.  6.  History of depression.  7.  Intermittent urinary incontinence.  8.  Hypertension.  9.  History of bladder infection.  10. Past history of diverticulosis and diverticulitis, resulting in total      colectomy.   DISCHARGE DIAGNOSES:  1.  Osteoarthritis, right knee, with valgus deformity.  2.  Postoperative blood loss anemia, did not require transfusion.  3.  Postoperative hyponatremia, improved.  4.  Postoperative hypokalemia.  5.  Postmenopausal.  6.  History of malignant melanoma.  7.  Persistent diarrhea following total colectomy.  8.  Intermittent reflux.  9.  History of depression.  10. Intermittent urinary incontinence.  11. Hypertension.  12. History of bladder infection.  13. Past history of diverticulosis and diverticulitis, resulting in total      colectomy.   PROCEDURE:  On April 24, 2005, right total knee.   SURGEON:  Ollen Gross, M.D.   ASSISTANT:  Alexzandrew L. Perkins, P.A.-C.   ANESTHESIA:  Spinal.   TOURNIQUET TIME:  Thirty-three minutes.   CONSULTS:  Rehab services.   BRIEF HISTORY:  [name] is a 77 year old female with end-stage arthritis  of her right knee with valgus deformity, previous accessible left total  knee.  Now presents for right total knee.   LABORATORY DATA:  Preop CBC:  Hemoglobin 12.8, hematocrit 38.6, white cell  count 5.5.  Postop hemoglobin 10.6 with an elevated white  count of 11.4,  drifted down to 9.6.  White count came back down to 10.1.  Last noted H&H,  hemoglobin is back up to 9.9, crit of 29.5.  White count down to a normal  level of 7.8.  PT/PTT preop 12.6 and 31 respectively with an INR of 0.9.  Serial pro times are followed.  Last noted PT/INR 20 and 1.7.  Chem panel on  admission all within normal limits with the exception of a low albumin of  3.4.  Serial BMETs are followed.  Sodium dropped from 142 to 133, back up to  142.  Potassium drifted from 4.5 to 3.8, went down to 3.4, stabilized at  3.4.  Glucose went up from 97 to 124, back down to 111.  Calcium dropped  from 8.9 to 8, back up to 8.3.  Preop UA:  Trace hemoglobin, moderate  leukocyte esterase, 11-20 white cells, 0-2 red cells, rare bacteria.  Treated preoperatively.  Blood group type O+.   EKG on April 19, 2005:  Normal sinus rhythm.  Possible left atrial  enlargement.  Left axis deviation.  Left ventricular hypertrophy.  When  compared, no significant changes.  Last tracing confirmed by Dr. Verdis Prime.   Two view chest, April 19, 2005:  No active disease.   HOSPITAL COURSE:  Patient was admitted to [facility], tolerated  the procedure well, and was later transferred to the recovery room and then  to the orthopedic floor.  Patient started on PCA and p.o. analgesics for  pain control following surgery.  A rehab consult was called postoperatively.  Had a rough night following surgery, was not able to get much sleep.  Changed the pain medications.  Held Colace due to history of diarrhea.  Also  Lonox.  Hemovac drain placed at the time of surgery was pulled.  She had a  wrist wound that had been receiving dressing changes on an outpatient basis.  We resumed this while she was in the hospital.  Encouraged her to use ice  packs.  She was seen by rehab services.  Felt to be appropriate for  inpatient [facility] stay.  By day #2, she was doing better.  She did have some   nausea on day #1, which had improved and had started sleeping a little bit  better.  The dressing was changed, incision looked good.  Started getting up  with physical therapy, ambulating approximately 35 feet.  PCA and fluids  were discontinued.  Hemoglobin was 9.6.  Continued with therapy.  It was  noted on the following day of April 27, 2005 that the patient was doing well.  Potassium had dropped, unfortunately, down to 3.4.  She was given K-Dur, and  she was later transferred over to [facility] later that day.  Would have  them recheck the electrolytes while in rehab.   DISCHARGE PLAN:  1.  Patient was discharged to [facility].  2.  Discharge diagnoses:  Please see above.  3.  Discharge meds:  Continue current medications as per the [facility], which will      be sent over with the patient.  4.  Diet:  Resume previous home diet.  5.  Activity:  Weightbearing as tolerated.  PT/OT for gait training      ambulation, ADLs, total knee protocol.  May start showering.  Daily      dressing changes.  6.  Follow up in the office with Dr. Despina Hick in two weeks or following      discharge from [facility] rehab unit.   DISPOSITION:  [facility].   CONDITION ON DISCHARGE:  Improving.      Alexzandrew L. Julien Girt, P.A.      Ollen Gross, M.D.  Electronically Signed    ALP/MEDQ  D:  05/18/2005  T:  05/19/2005  Job:  [id number]   cc:   Rehab Services   [place]. Yetta Barre, M.D.  Fax: [phone]   Consuello Bossier., M.D.  Fax: [phone]   Theressa Millard, M.D.  Fax: [phone]

## 2010-07-15 NOTE — Procedures (Signed)
[place]  Patient:    [name], [name] Visit Number: [id number] MRN: [id number]          Service Type: END Location: ENDO Attending Physician:  Charna Elizabeth Dictated by:   Anselmo Rod, M.D. Proc. Date: 11/16/00 Admit Date:  11/15/2000   CC:         Barbette Hair. Vaughan Basta., M.D.   Procedure Report  DATE OF BIRTH:  01-Feb-1931  REFERRING PHYSICIAN:  Barbette Hair. Vaughan Basta., M.D.  PROCEDURE PERFORMED:  Colonoscopy changed to flexible sigmoidoscopy.  ENDOSCOPIST:  Anselmo Rod, M.D.  INSTRUMENT USED:  Olympus video colonoscope.  INDICATIONS FOR PROCEDURE:  Screening colonoscopy planned in 77 year old white female patient has previous history of diverticular disease and has some left lower quadrant pain.  PREPROCEDURE PREPARATION:  Informed consent was procured from the patient. The patient was fasted for eight hours prior to the procedure and prepped with a bottle of magnesium citrate and a gallon of NuLytely the night prior to the procedure.  PREPROCEDURE PHYSICAL:  The patient had stable vital signs.  Neck supple. Chest clear to auscultation.  S1, S2 regular.  Abdomen soft with normal abdominal bowel sounds.  DESCRIPTION OF PROCEDURE:  The patient was placed in the left lateral decubitus position and sedated with 50 mg of Demerol and 5 mg of Versed intravenously.  Once the patient was adequately sedated and maintained on low-flow oxygen and continuous cardiac monitoring, the Olympus video colonoscope was advanced from the rectum to 70 cm with extreme difficulty. The patient had a very atonic colon and therefore the scope could not be advanced beyond 70 cm.  There was extensive left-sided diverticular disease. The patient tolerated the procedure well without complications.  The procedure was aborted at 70 cm.  IMPRESSION:  Incomplete colonoscopy.  Procedure aborted at 70 cm.  RECOMMENDATIONS:  Proceed with air contrast  barium enema to complete the GI evaluation.Dictated by:   Anselmo Rod, M.D. Attending Physician:  Charna Elizabeth DD:  11/16/00 TD:  11/16/00 Job: [id number]

## 2010-07-15 NOTE — Op Note (Signed)
NAMEMildred, Tuccillo Devonne                   ACCOUNT NO.:  [id number]   MEDICAL RECORD NO.:  [id number]          PATIENT TYPE:  INP   LOCATION:  X001                         FACILITY:  [facility]   PHYSICIAN:  Ollen Gross, M.D.    DATE OF BIRTH:  02-07-1931   DATE OF PROCEDURE:  04/24/2005  DATE OF DISCHARGE:                                 OPERATIVE REPORT   PREOPERATIVE DIAGNOSIS:  Osteoarthritis right knee with valgus deformity.   POSTOPERATIVE DIAGNOSIS:  Osteoarthritis right knee with valgus deformity.   PROCEDURE:  Right total knee arthroplasty.   SURGEON:  Ollen Gross, M.D.   ASSISTANT:  Alexzandrew L. Julien Girt, P.A.   ANESTHESIA:  Spinal.   ESTIMATED BLOOD LOSS:  Minimal.   DRAINS:  Hemovac x1.   TOURNIQUET TIME:  33 minutes at 300 mmHg.   COMPLICATIONS:  None.   CONDITION.:  Stable to recovery.   CLINICAL NOTE:  [name] is a 77 year old female with end-stage  osteoarthritis right knee with valgus deformity. She has had a previous  successful left total knee arthroplasty and presents now for right total  knee arthroplasty.   PROCEDURE IN DETAIL:  After successful administration of spinal anesthetic,  a tourniquet was placed high on the right thigh and right lower extremity  prepped and draped in the usual sterile fashion. Extremity is wrapped in  Esmarch, knee flexed, tourniquet inflated to 300 mmHg. A midline incision is  made with a  10 blade through the subcutaneous tissue to the level of the  extensor mechanism. Given her significant valgus deformity, we made a  lateral parapatellar arthrotomy. The soft tissue over the proximal lateral  tibia is then subperiosteally elevated around the joint line with the knife.  The patella is then everted medially, knee flexed 90 degrees, ACL and PCL  removed. A drill was then used to create a starting hole in the distal femur  and canal was thoroughly irrigated. A 5 degree right valgus alignment guide  was placed and we  positioned it to remove 10 mm off the distal femur. Distal  femoral resection is made with an oscillating saw. A sizing block is placed  and size 3 is the most appropriate. Rotation is marked off the epicondylar  axis.  A size 3 cutting block is placed and then the anterior, posterior and  chamfer cuts made.   The tibia is subluxed forward and the menisci removed. The extramedullary  tibial alignment guide is placed referencing proximally at the medial aspect  of the tibial tubercle and distally along the second metatarsal axis and  tibial crest. The block is pinned to remove about 4 mm off the deficient  lateral side. Tibial resection is made with an oscillating saw. A size 2.5  is the most appropriate tibial component and then the proximal tibia is  prepared with the modular drill and keel punch for a 2.5. Femoral  preparation is completed with the intercondylar cut for size 3.   A size 2.5 mobile bearing tibial trial with size 3 posterior stabilized  femoral trial and a 10 mm posterior  stabilized rotating platform insert  trial are placed. With the 10, full extension is achieved with excellent  varus and valgus balance throughout full range of motion. The patella was  then everted, thickness measured to be 23 mm. Freehand resection is taken to  13 mm, 35 template placed, lug holes were drilled, trial patella was placed  and it tracks normally. The osteophyte is removed off the posterior femur  with the trial in place. All trials are removed and the cut bone surfaces  are prepared with pulsatile lavage. The cement is mixed and once ready for  implantation the size 2.5 mobile bearing tibial tray, size 3 posterior  stabilized femur and 35 patella are cemented into place and patella is held  with a clamp. Trial 10 mm insert is placed, knee held in full extension and  all extruded cement removed. Once the cement is fully hardened then the  permanent 10 mm posterior stabilized rotating  platform insert is placed into  the tibial tray. The wound is copiously irrigated with saline solution and  the tourniquet released with a total time of 33 minutes. Minor bleeding is  stopped with cautery. The lateral arthrotomy is closed over a Hemovac drain  with interrupted #1 PDS. We left the area from the superior to inferior pole  of the patella open. The subcu is then closed with interrupted 2-0 Vicryl  and subcuticular with running 4-0 Monocryl. Incision is cleaned and dried  and Steri-Strips and a bulky sterile dressing applied. The drain is hooked  to suction and then she is placed into a knee immobilizer, awakened,  transported to recovery in stable condition.      Ollen Gross, M.D.  Electronically Signed     FA/MEDQ  D:  04/24/2005  T:  04/25/2005  Job:  [id number]

## 2010-07-15 NOTE — Op Note (Signed)
[name], [name]                             ACCOUNT NO.:  [id number]   MEDICAL RECORD NO.:  [id number]                   PATIENT TYPE:  INP   LOCATION:  0004                                 FACILITY:  [facility]   PHYSICIAN:  Ollen Gross, M.D.                 DATE OF BIRTH:  1930/11/26   DATE OF PROCEDURE:  11/11/2003  DATE OF DISCHARGE:                                 OPERATIVE REPORT   PREOPERATIVE DIAGNOSIS:  Osteoarthritis, left knee, with valgus deformity.   POSTOPERATIVE DIAGNOSIS:  Osteoarthritis, left knee, with valgus deformity.   PROCEDURE:  Left total knee arthroplasty.   SURGEON:  Ollen Gross, M.D.   ASSISTANT:  Alexzandrew L. Julien Girt, P.A.   ANESTHESIA:  Spinal.   ESTIMATED BLOOD LOSS:  Minimal.   DRAINS:  Hemovac x 1 .   TOURNIQUET TIME:  39 minutes at 300 mmHg.   COMPLICATIONS:  None.   CONDITION:  Stable to the recovery room.   BRIEF CLINICAL NOTE:  Ms. [name] is a 77 year old female who had a recent  successful hip replacement and comes in today secondary to severe  osteoarthritis of her left knee.  She has had intractable pain and no  improvement with injections.  She presents now for left total knee  arthroplasty.   PROCEDURE IN DETAIL:  After successful administration of spinal anesthetic,  a tourniquet was placed high on the left thigh and the left lower extremity  prepped and draped in the usual sterile fashion.  The extremity was wrapped  in Esmarch, knee flexed, and tourniquet inflated to 300 mmHg.  A standard  midline incision was made with a 10 blade through the subcutaneous tissue to  the level of the extensor mechanism.  Given her valgus deformity, we used a  lateral parapatellar approach.  The extensor mechanism was incised laterally  with a fresh blade and then the soft tissue over the proximal lateral tibia  subperiosteally elevated out along the joint margin.  The patella was then  everted medially and the ACL and PCL removed.  A drill  was used to create a  starting hole in the distal femur and the canal was irrigated.  A 5 degree  left valgus alignment guide is placed and the block is pinned to remove 10  mm off the distal femur.  Distal femoral resection is made with an  oscillating saw.  A sizing block is placed and a size 3 is the most  appropriate.  Rotation is marked off the epicondylar axis.  The anterior and  posterior cutting block is placed and cut subsequently made for a size 3.   The tibia was subluxed forward and the menisci were removed.  The  extramedullary tibial alignment guide was placed referencing proximally at  the medial aspect of the tibial tubercle and distally along the second  metatarsal axis of the tibial crest.  The block  is pinned to remove  approximately 4 mm off the more deficient lateral side.  Tibial resection is  made with an oscillating saw.  A size 3 is the most appropriate tibial  component.  The proximal tibia is then prepared with the modular drill and  keel punch for a size 3.  Femoral preparation is then completed with the  intercondylar and chamfer cuts.   A size 3 posterior stabilized femoral trial, size 3 mobile bearing tibial  trial, and a 10 mm posterior stabilized rotating platform placed.  With a  10, she hyperextends a little and has a little bit of varus valgus play, we  went up to 12.5 which allowed for full extension with excellent varus and  valgus stability throughout full range of motion.  The patella was then  everted, thickness measured to be 23 mm, free hand resection taken to 13 mm,  38 template is placed, lug holes were drilled, trial patella was placed, and  it tracks normally.  Osteophytes were removed from the posterior femur with  the trial in place.  All trials were removed and the cut bone surfaces are  prepared with pulsatile lavage.  The cement was mixed and once ready for  implantation, size 3 mobile bearing tibial tray, size 3 posterior stabilized   femur, and 38 patella were cemented in place, the patella held with a clamp.  A trial 12.5 mm insert is placed with the knee in full extension, all excess  cement is removed.  Once the cement was fully hardened, a permanent 12.5 mm  posterior stabilized rotating platform insert is placed to the tibial tray.  The wound is copiously irrigated with antibiotic solution and then the  extensor mechanism is closed laterally with interrupted #1 PDS suture.  This  was left open from the superior to the inferior pole of the patella to serve  as a mini-lateral release.  Flexion against gravity is 135 degrees and the  patella tracked perfectly.  The subcu was closed with interrupted 2-0  Vicryl, the subcuticular with running 4-0 Monocryl.  The incision was  cleaned and dried, Steri-Strips and a bulky, sterile dressing was applied.  She was then awakened and transferred to the recovery room in stable  condition.                                               Ollen Gross, M.D.    FA/MEDQ  D:  11/11/2003  T:  11/11/2003  Job:  [id number]

## 2010-07-15 NOTE — Op Note (Signed)
[name], [name]                             ACCOUNT NO.:  [id number]   MEDICAL RECORD NO.:  [id number]                   PATIENT TYPE:  INP   LOCATION:  0003                                 FACILITY:  [facility]   PHYSICIAN:  Adolph Pollack, M.D.            DATE OF BIRTH:  [birth year]   DATE OF PROCEDURE:  04/08/2002  DATE OF DISCHARGE:                                 OPERATIVE REPORT   PREOPERATIVE DIAGNOSIS:  Complicated diverticular disease with history of  lower gastrointestinal bleeding secondary to diverticular disease (exact  site unknown) and diverticulitis.   POSTOPERATIVE DIAGNOSIS:  Complicated diverticular disease with history of  lower gastrointestinal bleeding secondary to diverticular disease (exact  site unknown) and diverticulitis.   PROCEDURE:  Total abdominal colectomy.   SURGEON:  Adolph Pollack, M.D.   ASSISTANT:  Sheppard Plumber. Earlene Plater, M.D.   ANESTHESIA:  General.   INDICATION:  [name] is a 77 year old female, who has had complicated  diverticular disease.  She has had lower GI bleeds which were felt to be  diverticular in origin, but the exact site is not known.  She also has a  history of diverticulitis and some high fecal impaction.  I suggested to her  in the past when I had seen her that she undergo a subtotal to total  abdominal colectomy.  She was hesitant at that time but has continued to  have complications of diverticular disease and now presents for the above  operation.  The procedure and the risks were explained to her  preoperatively.   TECHNIQUE:  She was seen in the holding room, then brought to the operating  room, placed supine on the operating table, and a general anesthetic was  administered.  Her abdomen was sterilely prepped and draped.  Foley catheter  had been placed in the bladder.  She was placed in lithotomy position before  prepping and draping.  A midline incision was made, incising and old  inferior midline incision  and carrying it up to the epigastric region.  The  subcutaneous tissue and peritoneum were divided.  The peritoneal cavity was  then entered.  There were no significant adhesions between the omentum and  anterior abdominal wall.  The liver appeared normal.  The gallbladder  appeared normal without stones.  The surface of the stomach appeared normal.  The small bowel appeared to be normal.  Examining the colon, appeared to be  some scattered diverticular disease.  It was most concentrated in the  sigmoid region.   I isolated the distal ileum and mobilized it by freeing up adhesions.  I  then divided the ileum just proximal to the ileocecal valve with the GIA  stapler.  I then mobilized the colon by incising its lateral attachments  with the cautery.  I dissected the omentum free from the transverse colon  and mobilized the splenic flexure all the way  down to the distal sigmoid  colon.  I then began dividing mesenteric vessels between Kelly clamps all  the way around the right, transverse, and descending colon, down to the mid  sigmoid colon.  I identified both ureters.  I identified them prior to this  and again identified the left ureter and kept the plane of dissection  anterior to it.  I mobilized the sigmoid colon all the way down to the  rectosigmoid junction where diverticular disease was evident.  I then  divided the colon at the rectosigmoid junction and divided the mesentery and  ligated between the clamps.  All vessels were ligated.  The specimen was  handed off the field.   The area was irrigated and bleeding points controlled with the cautery.  Once hemostasis was adequate, I then brought the ileal stump into view and  made sure it was laying properly with respect to its mesentery.  On the  antimesenteric side, I made an incision, and I put a pursestring suture of 2-  0 Prolene around the opening.  I had placed a size 28 EEA anvil into the  terminal ileum and then closed it up  by tightening up and tying down the  pursestring suture.   The handle of the EEA stapler was introduced from below through the rectum  and brought up to the rectal stump.  I then performed an end-rectum-to-side  ileum circular stapled anastomosis.  Most of the anastomosis I guided to  come out the anterior rectal wall.  Once this was performed, we had two  complete donuts, although it appeared to be thin on the anterior side of the  rectum.  I occluded the ileum proximal to the anastomosis and then flooded  it with air and placed it under water, and no leak was noted.  However, on  examining, the anterior wall appeared to be a little thin, and I reinforced  this with interrupted 3-0 silk sutures in a Lembert type fashion.  Next,  gown and gloves were removed.  The area was copiously irrigated.  No further  bleeding was noted.  The small intestine was allowed to fall back into the  abdomen.  At this time, sponge, needle, and instrument counts were reported  to be correct.  All irrigation fluid was then evacuated.  I then placed the  omentum over the small intestine.  The fascia was closed with a running #1  PDS suture.  The subcutaneous tissue was irrigated, and the skin was closed  with staples.   She tolerated the procedure well without any apparent complications and was  taken to the recovery room in satisfactory condition.                                               Adolph Pollack, M.D.    Kari Baars  D:  04/08/2002  T:  04/08/2002  Job:  [id number]   cc:   Anselmo Rod, M.D.  [address]  [address]  [place]  [place] [address]  Fax: [phone]   Ike Bene, M.D.  [address]  [place]  [place] [address]  Fax: [phone]

## 2010-07-15 NOTE — H&P (Signed)
[name], [name]                             ACCOUNT NO.:  [id number]   MEDICAL RECORD NO.:  [id number]                   PATIENT TYPE:  INP   LOCATION:  NA                                   FACILITY:  [facility]   PHYSICIAN:  [name], M.D.                 DATE OF BIRTH:  [birth year]   DATE OF ADMISSION:  10/19/2003  DATE OF DISCHARGE:                                HISTORY & PHYSICAL   CHIEF COMPLAINT:  Left knee pain.   HISTORY OF PRESENT ILLNESS:  The patient is a 77 year old female well known  to Dr. Ollen [name] having previously undergone a left total hip  replacement arthroplasty and has done quite well with that.  She has a known  history of knee pain and arthritis.  Both knees are found to have  significant arthritic changes and she continues to have progressive pain.  She has been treated conservatively in the with nonoperative measures and  also a series of injections which have only provided temporary relief.  She  is at a point now where the continued pain is interfering with her life  style and it felt she would benefit from undergoing a knee replacement.  Risks and benefits of the knee replacement have been discussed with the  patient and she elects to proceed with surgery.  She has planned to undergo  a left total knee arthroplasty per Dr. Lequita [name].   ALLERGIES:  No known drug allergies.   INTOLERANCE:  SEPTRA causes nausea.   CURRENT MEDICATIONS:  1. Diltiazem 250 mg ER 1 tablet p.o. daily.  2. Lovenox 2.5 mg 2 tablets 1/2 hour before each meal.  3. Trimethoprim 100 mg 1 tablet at h.s.  4. Tramadol 50 mg 1 or 2 q.6-8h p.r.n. pain.  5. Amitriptyline 25 mg 1 tablet at h.s.  6. Tylenol P.M. 1 at h.s. p.r.n. sleep.  7. Pepcid Complete over-the-counter p.r.n. reflux (she has also used     Protonix in the hospital in the past).   PAST MEDICAL HISTORY:  1. Diverticulitis resulting in a total colectomy.  2. History of bladder infections.  3. Hypertension.  4.  Intermittent urinary incontinence.  5. Depression.  6. Intermittent reflux.  7. Persistent diarrhea following total colectomy.  8. Most recently diagnosed with a malignant melanoma.  9. Osteoarthritis.  10.      Post menopausal.   PAST SURGICAL HISTORY:  1. Recent excision of melanoma approximately 2 weeks ago.  2. Total hysterectomy and appendectomy in 1997.  3. Surgery to correct stress urinary incontinence in 1999 and again in 2000.  4. Bilateral knee arthroscopies.  5. Total colectomy secondary to diverticulitis.  6. Left total hip replacement in 2004.   SOCIAL HISTORY:  Divorced, retired Runner, broadcasting/film/video. Nonsmoker. Occasional intake of  wine.  Has 1 son who lives in [place].   FAMILY HISTORY:  Mother deceased at age 83  with heart disease, MI and heart  failure.  Father deceased at age 62 with hypertension and stroke.   REVIEW OF SYSTEMS:  GENERAL:  No fever, chills, night sweats.  NEURO:  Gives  a history of depression, no seizures, syncope, paralysis.  RESPIRATORY:  No  shortness of breath, productive cough or hemoptysis.  CARDIOVASCULAR:  History of hypertension but no chest pain, angina, orthopnea.  GI:  She does  have some occasional reflux, she is taking over-the-counter Pepcid Complete.  She also has current persistent diarrhea which is mostly postprandial  diarrhea secondary to her total colectomy.  GU:  Occasional urinary  incontinence with some occasional urinary frequency and urgency.  She also  has some occasional nocturia.  MUSCULOSKELETAL:  Pertinent that of the knee  found in the history of present illness.   PHYSICAL EXAMINATION:  VITAL SIGNS:  Pulse 88, respirations 12, blood  pressure 142/82.  GENERAL:  A 77 year old white female, well-developed, well-nourished, in no  acute distress but somewhat mildly anxious.  Prior to her surgery.  She  appears to be a good historian.  HEENT:  Normocephalic, atraumatic.  Pupils are equal, round and reactive.  Pharynx clear.   She does have partial upper dentures.  Extraocular movements  are intact.  NECK:  Supple, no carotid bruits.  CHEST:  Clear anterior and posterior chest walls, no rhonchi, rales or  wheezing.  HEART:  Regular rate and rhythm, no murmurs S1, S2 is noted.  ABDOMEN:  Soft, flat, nontender, bowel sounds are present.  RECTAL/BREAST, GENITALIA:  Not done.  Not pertinent to present illness.  EXTREMITIES:  The left knee shows marked crepitus on passive range of  motion.  No obvious effusion is noted on exam.  She does have a valgus deformity  malalignment, range of motion of 5 to 120 degrees.   LABORATORY DATA:  It was noted preoperatively that she did have some  elevated liver enzymes, AST level of 105, ALT of 180, alkaline phosphatase  elevated at 126.  These labs will be sent to her medical doctor.  (Dictation  done at time pending recommendations from patient's physician).   IMPRESSION:  1. Osteoarthritis left knee.  2. History of depression.  3. Hypertension.  4. Occasional reflux.  5. 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  6. History of urinary stress incontinence.  7. History of urinary tract infections.  8. Recently diagnosed malignant melanoma.  9. Osteoarthritis.  10.      Post menopausal.   PLAN:  The patient will be admitted to [facility] to under a left  total knee replacement arthroplasty.  Surgery will be performed by Dr. Ollen [name].     [name], P.A.              [name], M.D.    ALP/MEDQ  D:  10/14/2003  T:  10/15/2003  Job:  [id number]   cc:   [name], M.D.  [address]  [place]  [place] [address]  Fax: [phone]   [name], M.D.  [address] [place]  [place] [address]  Fax: [phone]   [name]., M.D.  [address]  [address]  [place]  [place] [address]  Fax: [phone]

## 2010-07-15 NOTE — Discharge Summary (Signed)
NAMEZEENAT, JEANBAPTISTE                   ACCOUNT NO.:  [id number]   MEDICAL RECORD NO.:  [id number]          PATIENT TYPE:  IPS   LOCATION:  4007                         FACILITY:  [facility]   PHYSICIAN:  Ranelle Oyster, M.D.DATE OF BIRTH:  [birth year]   DATE OF ADMISSION:  11/14/2003  DATE OF DISCHARGE:  11/19/2003                                 DISCHARGE SUMMARY   DISCHARGE DIAGNOSES:  1.  Left total knee replacement secondary to degenerative joint disease,      November 11, 2002.  2.  Pain management.  3.  Coumadin for deep venous thrombosis prophylaxis.  4.  Hypertension.  5.  Anemia.  6.  History of a left total hip arthroplasty in 2004.  7.  Diverticulitis.   HISTORY OF PRESENT ILLNESS:  [age]-old white female, history of a  left total hip arthroplasty in 2004 of which she received inpatient rehab  services for, now admitted to [facility], November 11, 2003, with  end-stage changes of the left knee and no relief with conservative care,  underwent a left total knee arthroplasty, November 11, 2003, per Dr. Ollen Gross, placed on Coumadin for deep venous thrombosis prophylaxis,  weightbearing as tolerated.  Hospital course uneventful.  She was admitted  for a comprehensive rehab program.   PAST MEDICAL HISTORY:  See discharge diagnoses.   ALLERGIES:  SULFA, AUGMENTIN and DILAUDID.   HABITS:  Occasional alcohol, nonsmoker.   MEDICATIONS PRIOR TO ADMISSION:  Flonase, Clarinex and Cardizem.   PAST MEDICAL HISTORY:  See discharge diagnoses.   SOCIAL HISTORY:  Divorced, lives alone in [place], retired, 1-level home,  3 steps to entry, no local family, good support of friends.   HOSPITAL COURSE:  Patient with progressive gains while on rehab services  with therapies initiated on a b.i.d. basis.  The following issues were  followed during patient's rehab course:  Pertaining to Ms. Ruffino's left  total knee arthroplasty, surgical site healing nicely,  Steri-Strips were in  place, she was ambulating with a walker, supervision, CPM machine at 90  degrees.  Home health therapies had been arranged.  She would follow up with  Dr. Lequita Halt in 1 week.  She remained on oxycodone as needed for pain.  Postoperative anemia stable with latest hemoglobin 10.5, hematocrit 30.6, on  iron supplement.  Blood pressure remained controlled with Cardizem.  She had  no headache or dizziness throughout her rehab course.  She did have a  history of diverticulitis; she was taking Lomotil before each meal; this was  also done before her hospital admission.  She has no other bowel or bladder  disturbances.   Latest labs showed an INR of 2.8 on November 18, 2003; hemoglobin 10.5,  hematocrit 30.6; sodium 137, potassium 3.7, BUN 12, creatinine 0.7.   DISCHARGE MEDICATIONS:  Discharge medications at time of dictation included:  1.  Coumadin 3 mg daily to be completed December 11, 2003.  2.  Cardizem CD 240 mg daily.  3.  Trinsicon twice daily.  4.  Nasonex 2 sprays at bedtime.  5.  Trimpex 100  mg at bedtime.  6.  Protonix 40 mg daily.  7.  Lomotil before each meal.  8.  Oxycodone as needed -- pain.   ACTIVITY:  Activity as tolerated.   DIET:  Regular.   WOUND CARE:  Cleanse incision daily with warm water and soap.   SPECIAL INSTRUCTIONS:  No driving.  Home health nurse per [facility] to check a prothrombin time on November 20, 2003.   FOLLOWUP:  Follow up with Dr. Lequita Halt in 1 week; Dr. Ike Bene,  medical management.       DA/MEDQ  D:  11/19/2003  T:  11/20/2003  Job:  [id number]   cc:   Ike Bene, M.D.  [address]  [place]  [place] [address]  Fax: [phone]   Ollen Gross, M.D.  [facility]  [address]  [address]  [place]  [place] [address]  Fax: [phone]

## 2010-11-21 IMAGING — CR DG HIP 1V PORT*R*
1 series · 1 of 1 positions shown · non-contrast
Comparison: Preop films of 06/02/2009

CLINICAL DATA: Postop right hip replacement

PORTABLE RIGHT HIP - 1 VIEW

[view not recorded]
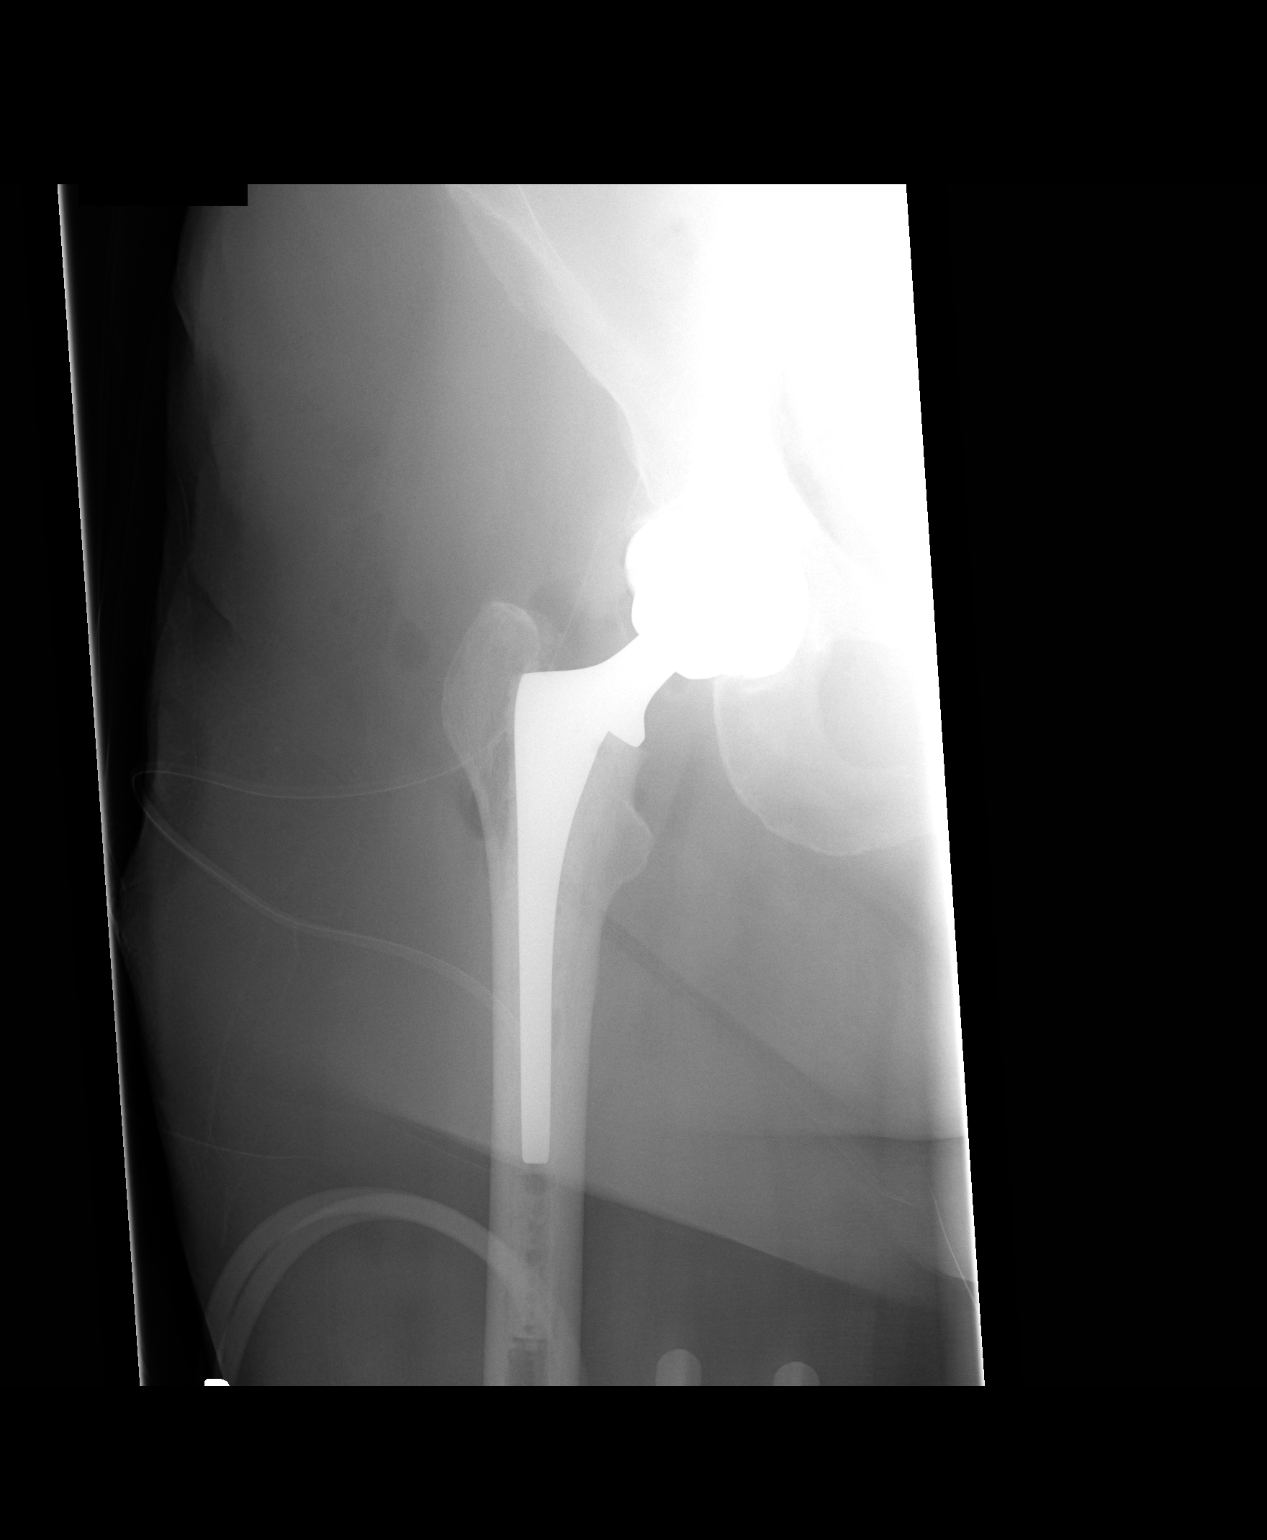

[1 of 1 positions shown; findings below may reference images not displayed]

FINDINGS: A portable view shows the femoral and acetabular
components of the right hip replacement in good position.  No acute
bony abnormality is seen.
IMPRESSION: Components of right total hip replacement in good position on
portable view obtained.

## 2010-11-21 IMAGING — CR DG PORTABLE PELVIS
1 series · 1 of 1 positions shown · non-contrast
Comparison: None

CLINICAL DATA: Right hip replacement

PORTABLE PELVIS

[view not recorded]
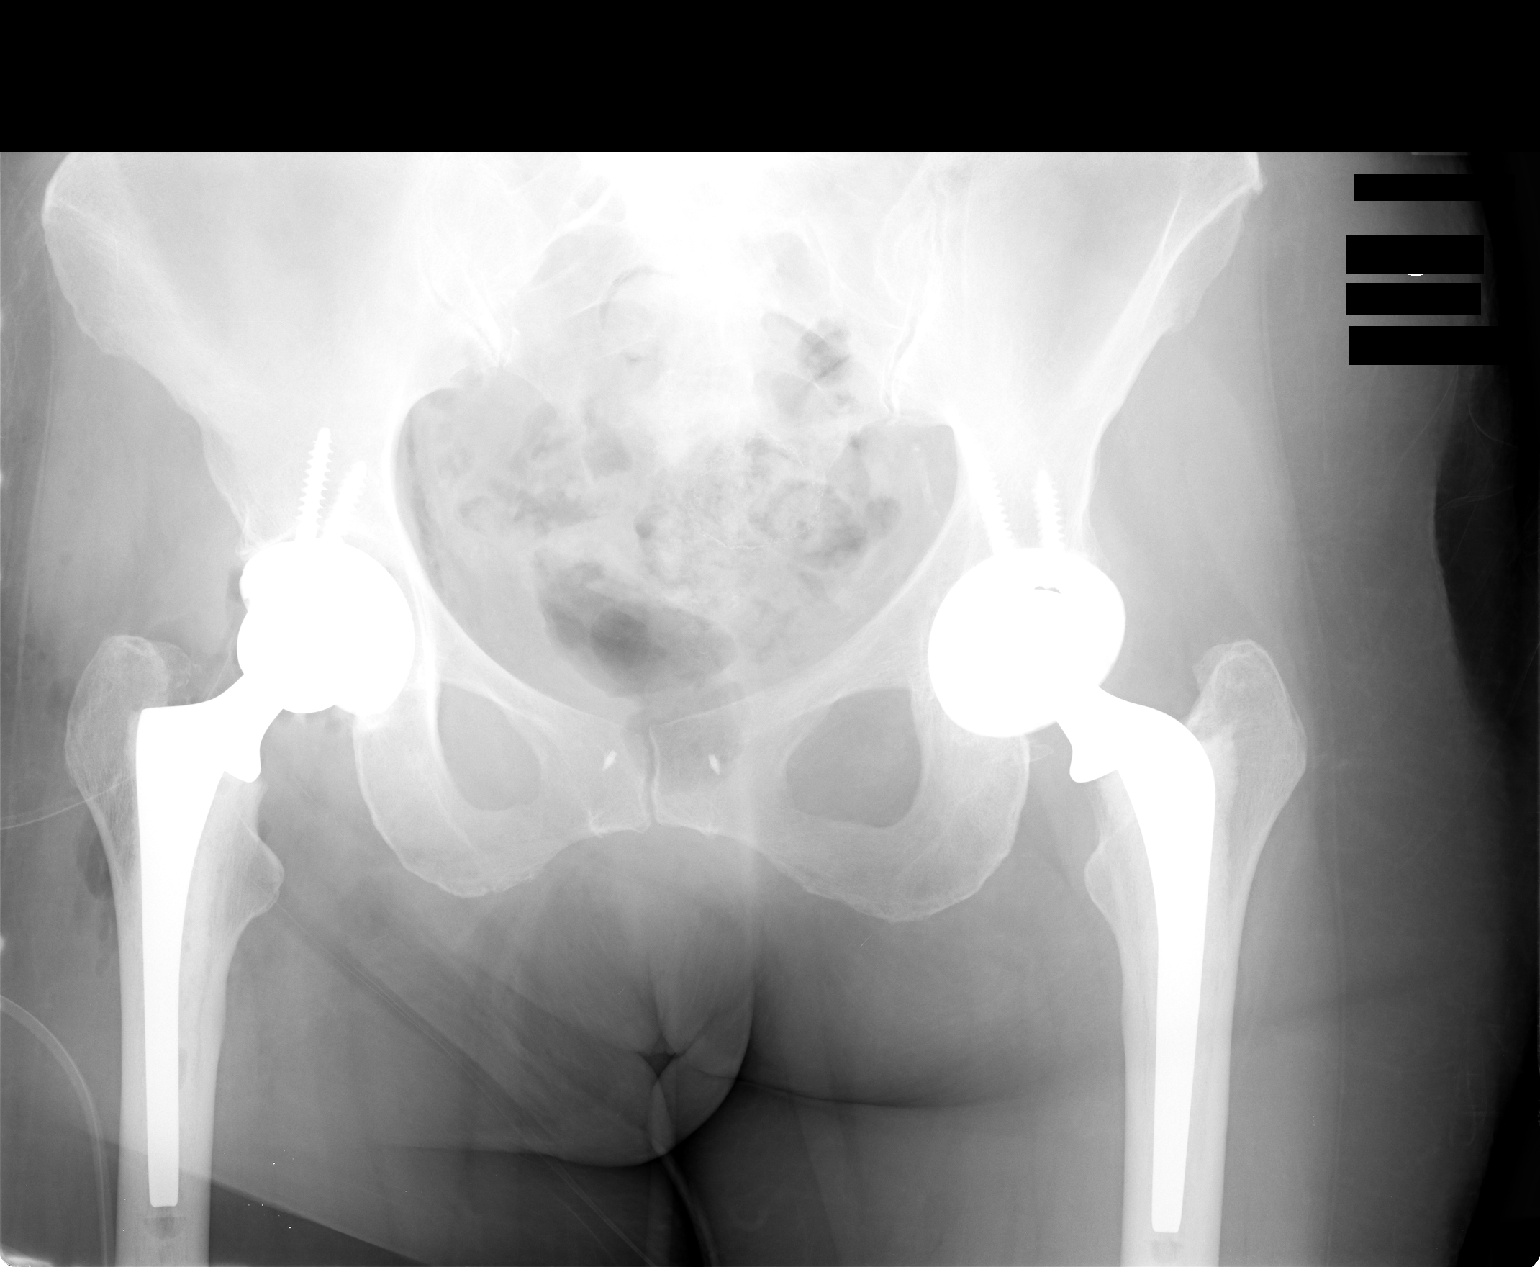

[1 of 1 positions shown; findings below may reference images not displayed]

FINDINGS: Total hip replacement is performed on the right.
Acetabular and femoral components appear grossly well positioned.
Soft tissue drain in place.  No apparent complication.  Previous
left hip replacement.  Anchors in the region of the symphysis.
IMPRESSION: Good appearance following total hip replacement on the right.

## 2011-04-19 ENCOUNTER — Ambulatory Visit (HOSPITAL_COMMUNITY)
Admission: RE | Admit: 2011-04-19 | Discharge: 2011-04-19 | Disposition: A | Discharge status: Home / Self Care | Payer: Medicare Other | Source: Ambulatory Visit | Attending: Gastroenterology | Admitting: Gastroenterology

## 2011-04-19 ENCOUNTER — Encounter (HOSPITAL_COMMUNITY): Payer: Self-pay | Admitting: *Deleted

## 2011-04-19 ENCOUNTER — Ambulatory Visit (HOSPITAL_COMMUNITY): Payer: Medicare Other | Admitting: Anesthesiology

## 2011-04-19 ENCOUNTER — Encounter (HOSPITAL_COMMUNITY): Payer: Self-pay | Admitting: Anesthesiology

## 2011-04-19 ENCOUNTER — Encounter (HOSPITAL_COMMUNITY)
Admission: RE | Disposition: A | Discharge status: Home / Self Care | Payer: Self-pay | Source: Ambulatory Visit | Attending: Gastroenterology

## 2011-04-19 DIAGNOSIS — K2970 Gastritis, unspecified, without bleeding: Secondary | ICD-10-CM | POA: Insufficient documentation

## 2011-04-19 DIAGNOSIS — K449 Diaphragmatic hernia without obstruction or gangrene: Secondary | ICD-10-CM | POA: Insufficient documentation

## 2011-04-19 DIAGNOSIS — R1310 Dysphagia, unspecified: Secondary | ICD-10-CM | POA: Insufficient documentation

## 2011-04-19 DIAGNOSIS — K222 Esophageal obstruction: Secondary | ICD-10-CM | POA: Insufficient documentation

## 2011-04-19 HISTORY — DX: Urinary tract infection, site not specified: N39.0

## 2011-04-19 HISTORY — DX: Major depressive disorder, single episode, unspecified: F32.9

## 2011-04-19 HISTORY — DX: Unspecified osteoarthritis, unspecified site: M19.90

## 2011-04-19 HISTORY — DX: Nontoxic goiter, unspecified: E04.9

## 2011-04-19 HISTORY — PX: ESOPHAGOGASTRODUODENOSCOPY: SHX5428

## 2011-04-19 HISTORY — PX: BALLOON DILATION: SHX5330

## 2011-04-19 HISTORY — DX: Headache: R51

## 2011-04-19 HISTORY — DX: Polyneuropathy, unspecified: G62.9

## 2011-04-19 HISTORY — DX: Noninfective gastroenteritis and colitis, unspecified: K52.9

## 2011-04-19 HISTORY — DX: Essential (primary) hypertension: I10

## 2011-04-19 HISTORY — DX: Depression, unspecified: F32.A

## 2011-04-19 SURGERY — EGD (ESOPHAGOGASTRODUODENOSCOPY)
Anesthesia: Monitor Anesthesia Care

## 2011-04-19 MED ORDER — OMEPRAZOLE 20 MG PO CPDR: 20.0000 mg | DELAYED_RELEASE_CAPSULE | Freq: Every day | ORAL | Status: DC

## 2011-04-19 MED ORDER — PROPOFOL 10 MG/ML IV EMUL
INTRAVENOUS | Status: DC | PRN
  Administered 2011-04-19: 75 ug/kg/min via INTRAVENOUS

## 2011-04-19 MED ORDER — FENTANYL CITRATE 0.05 MG/ML IJ SOLN
INTRAMUSCULAR | Status: DC | PRN
  Administered 2011-04-19 (×2): 50 ug via INTRAVENOUS

## 2011-04-19 MED ORDER — LACTATED RINGERS IV SOLN: INTRAVENOUS | Status: DC

## 2011-04-19 MED ORDER — KETAMINE HCL 10 MG/ML IJ SOLN
INTRAMUSCULAR | Status: DC | PRN
  Administered 2011-04-19 (×2): 10 mg via INTRAVENOUS

## 2011-04-19 MED ORDER — MIDAZOLAM HCL 5 MG/5ML IJ SOLN
INTRAMUSCULAR | Status: DC | PRN
  Administered 2011-04-19 (×2): 1 mg via INTRAVENOUS

## 2011-04-19 MED ORDER — LACTATED RINGERS IV SOLN
INTRAVENOUS | Status: DC
  Administered 2011-04-19: 10:00:00 via INTRAVENOUS

## 2011-04-19 MED ORDER — BUTAMBEN-TETRACAINE-BENZOCAINE 2-2-14 % EX AERO
INHALATION_SPRAY | CUTANEOUS | Status: DC | PRN
  Administered 2011-04-19: 2 via TOPICAL

## 2011-04-19 MED ORDER — MEPERIDINE HCL 50 MG/ML IJ SOLN: 6.2500 mg | INTRAMUSCULAR | Status: DC | PRN

## 2011-04-19 MED ORDER — PROMETHAZINE HCL 25 MG/ML IJ SOLN: 6.2500 mg | INTRAMUSCULAR | Status: DC | PRN

## 2011-04-19 NOTE — Op Note (Signed)
[facility] [address] [place], [place]  [address]  ENDOSCOPY PROCEDURE REPORT  PATIENT:  [name], [name]  MR#:  [id number] BIRTHDATE:  03/30/1930, 80 yrs. old  GENDER:  female ENDOSCOPIST:  Willis Modena, MD Referred by:  Theressa Millard, M.D. PROCEDURE DATE:  04/19/2011 PROCEDURE:  EGD with balloon dilatation ASA CLASS:  Class II INDICATIONS:  Dysphagia MEDICATIONS:   Cetacaine spray x 2, MAC sedation, administered by CRNA  DESCRIPTION OF PROCEDURE:   After the risks benefits and alternatives of the procedure were thoroughly explained, informed consent was obtained.  The Pentax Gastroscope [id number] endoscope was introduced through the mouth and advanced to the second portion of the duodenum, without limitations.  The instrument was slowly withdrawn as the mucosa was fully examined.  <<PROCEDUREIMAGES>>  FINDINGS:  Somewhat tortuous esophagus.  Small hiatal hernia; widely patent Schatzki's ring, doubtful significance, but dilated to 16.13mm with minimal resistance with TTS balloon dilation catheter. Mild gastritis and bulbar duodenitis. Otherwise normal endoscopy to second portion of the duodenum.  ENDOSCOPIC IMPRESSION:    1.  Small hiatal hernia and widely patent Schatzki's ring, dilated to 16.59mm. 2.  Mild gastroduodenitis. 3.  No obvious source of dysphagia; suspect GERD and dysmotility playing role.  RECOMMENDATIONS:      1.  Watch for potential complications of procedure. 2.  Start Prilosec OTC 20 mg once-a-day. 3.  Follow clinical response to dilatation. 4.  Office visit 2-3 weeks.  If dysphagia persists, consider barium esophagram.  ______________________________ Willis Modena  CC:  n. eSIGNEDWillis Modena at 04/19/2011 10:46 AM  Cyndia Bent, [phone]

## 2011-04-19 NOTE — Discharge Instructions (Signed)
Endoscopy  Care After  Please read the instructions outlined below and refer to this sheet in the next few weeks. These discharge instructions provide you with general information on caring for yourself after you leave the hospital. Your doctor may also give you specific instructions. While your treatment has been planned according to the most current medical practices available, unavoidable complications occasionally occur. If you have any problems or questions after discharge, please call your doctor. HOME CARE INSTRUCTIONS Activity  You may resume your regular activity but move at a slower pace for the next 24 hours.   Take frequent rest periods for the next 24 hours.   Walking will help expel (get rid of) the air and reduce the bloated feeling in your abdomen.   No driving for 24 hours (because of the anesthesia (medicine) used during the test).   You may shower.   Do not sign any important legal documents or operate any machinery for 24 hours (because of the anesthesia used during the test).  Nutrition  Drink plenty of fluids.   You may resume your normal diet.   Begin with a light meal and progress to your normal diet.   Avoid alcoholic beverages for 24 hours or as instructed by your caregiver.  Medications You may resume your normal medications unless your caregiver tells you otherwise. What you can expect today  You may experience abdominal discomfort such as a feeling of fullness or "gas" pains.   You may experience a sore throat for 2 to 3 days. This is normal. Gargling with salt water may help this.  Follow-up Your doctor will discuss the results of your test with you. SEEK IMMEDIATE MEDICAL CARE IF:  You have excessive nausea (feeling sick to your stomach) and/or vomiting.   You have severe abdominal pain and distention (swelling).   You have trouble swallowing.   You have a temperature over 100 F (37.8 C).   You have rectal bleeding or vomiting of blood.      Esophageal Dilation  The esophagus is the long, narrow tube which carries food and liquid from the mouth to the stomach. Esophageal dilatation is the technique used to stretch a blocked or narrowed portion of the esophagus. This procedure is used when a part of the esophagus has become so narrow that it becomes difficult, painful or even impossible to swallow. This is generally an uncomplicated form of treatment. When this is not successful, chest surgery may be required. This is a much more extensive form of treatment with a longer recovery time. CAUSES  Some of the more common causes of blockage or strictures of the esophagus are:  Narrowing from longstanding inflammation (soreness and redness) of the lower esophagus. This comes from the constant exposure of the lower esophagus to the acid which bubbles up from the stomach. Over time this causes scarring and narrowing of the lower esophagus.   Hiatal hernia in which a small part of the stomach bulges (herniates) up through the diaphragm. This can cause a gradual narrowing of the end of the esophagus.   Schatzki's Ring is a narrow ring of benign (non-cancerous) fibrous tissue which constricts the lower esophagus. The reason for this is not known.   Scleroderma is a connective tissue disorder that affects the esophagus and makes swallowing difficult.   Achalasia is an absence of nerves to the lower esophagus and to the esophageal sphincter. This is the circular muscle between the stomach and esophagus that relaxes to allow food into  the stomach. After swallowing, it contracts to keep food in the stomach. This absence of nerves may be congenital (present since birth). This can cause irregular spasms of the lower esophageal muscle. This spasm does not open up to allow food and fluid through. The result is a persistent blockage with subsequent slow trickling of the esophageal contents into the stomach.   Strictures may develop from swallowing  materials which damage the esophagus. Some examples are strong acids or alkalis such as lye.   Growths such as benign (non-cancerous) and malignant (cancerous) tumors can block the esophagus.   Heredity (present since birth) causes.  DIAGNOSIS  Your caregiver often suspects this problem by taking a medical history. They will also do a physical exam. They can then prove their suspicions using X-rays and endoscopy. Endoscopy is an exam in which a tube like a small flexible telescope is used to look at your esophagus.  TREATMENT There are different stretching (dilating) techniques which can be used. Simple bougie dilatation may be done in the office. This usually takes only a couple minutes. A numbing (anesthetic) spray of the throat is used. Endoscopy, when done, is done in an endoscopy suite, under mild sedation. When fluoroscopy is used, the procedure is performed in X-ray. Other techniques require a little longer time. Recovery is usually quick. There is no waiting time to begin eating and drinking to test success of the treatment. Following are some of the methods used. Narrowing of the esophagus is treated by making it bigger. Commonly this is a mechanical problem which can be treated with stretching. This can be done in different ways. Your caregiver will discuss these with you. Some of the means used are:  A series of graduated (increasing thickness) flexible dilators can be used. These are weighted tubes passed through the esophagus into the stomach. The tubes used become progressively larger until the desired stretched size is reached. Graduated dilators are a simple and quick way of opening the esophagus. No visualization is required.   Another method is the use of endoscopy to place a flexible wire across the stricture. The endoscope is removed and the wire left in place. A dilator with a hole through it from end to end is guided down the esophagus and across the stricture. One or more of  these dilators are passed over the wire. At the end of the exam, the wire is removed. This type of treatment may be performed in the X-ray department under fluoroscopy. An advantage of this procedure is the examiner is visualizing the end opening in the esophagus.   Stretching of the esophagus may be done using balloons. Deflated balloons are placed through the endoscope and across the stricture. This type of balloon dilatation is often done at the time of endoscopy or fluoroscopy. Flexible endoscopy allows the examiner to directly view the stricture. A balloon is inserted in the deflated form into the area of narrowing. It is then inflated with air to a certain pressure that is pre-set for a given circumference. When inflated, it becomes sausage shaped, stretched, and makes the stricture larger.   Achalasia requires a longer larger balloon-type dilator. This is frequently done under X-ray control. In this situation, the spastic muscle fibers in the lower esophagus are stretched.  All of the above procedures make the passage of food and water into the stomach easier. They also make it easier for stomach contents to reflux back into the esophagus. Special medications may be used following the procedure to  help prevent further stricturing. Proton-pump inhibitor medications are good at decreasing the amount of acid in the stomach juice. When stomach juice refluxes into the esophagus, the juice is no longer as acidic and is less likely to burn or scar the esophagus. RISKS AND COMPLICATIONS Esophageal dilatation is usually performed effectively and without problems. Some complications that can occur are:  A small amount of bleeding almost always happens where the stretching takes place. If this is too excessive it may require more aggressive treatment.   An uncommon complication is perforation (making a hole) of the esophagus. The esophagus is thin. It is easy to make a hole in it. If this happens, an  operation may be necessary to repair this.   A small, undetected perforation could lead to an infection in the chest. This can be very serious.  HOME CARE INSTRUCTIONS   If you received sedation for your procedure, do not drive, make important decisions, or perform any activities requiring your full coordination. Do not drink alcohol, take sedatives, or use any mind altering chemicals unless instructed by your caregiver.   You may use throat lozenges or warm salt water gargles if you have throat discomfort   You can begin eating and drinking normally on return home unless instructed otherwise. Do not purposely try to force large chunks of food down to test the benefits of your procedure.   Mild discomfort can be eased with sips of ice water.   Medications for discomfort may or may not be needed.  SEEK IMMEDIATE MEDICAL CARE IF:   You begin vomiting up blood.   You develop black tarry stools   You develop chills or an unexplained temperature of over 101 F (38.3 C)   You develop chest or abdominal pain.   You develop shortness of breath or feel lightheaded or faint.   Your swallowing is becoming more painful, difficult, or you are unable to swallow.  MAKE SURE YOU:   Understand these instructions.   Will watch your condition.   Will get help right away if you are not doing well or get worse.

## 2011-04-19 NOTE — H&P (Signed)
Patient interval history reviewed.  Patient examined again.  There has been no change from documented H/P dated 03/28/2011 (scanned into chart from our office) except as documented above.  Patient with solid dysphagia and presents for endoscopy +/- dilatation for further evaluation.  Risks (bleeding, infection, bowel perforation that could require surgery, sedation-related changes in cardiopulmonary systems), benefits (identification and possible treatment of source of symptoms, exclusion of certain causes of symptoms), and alternatives (watchful waiting, radiographic imaging studies, empiric medical treatment) of upper endoscopy (EGD) were explained to patient in detail and she wishes to proceed.

## 2011-04-19 NOTE — Brief Op Note (Signed)
Please see [facility] note dated 04/19/11.   

## 2011-04-19 NOTE — Anesthesia Postprocedure Evaluation (Signed)
  Anesthesia Post-op Note  Patient: [name]  Procedure(s) Performed: Procedure(s) (LRB): ESOPHAGOGASTRODUODENOSCOPY (EGD) (N/A) BALLOON DILATION (N/A)  Patient Location: PACU  Anesthesia Type: MAC  Level of Consciousness: awake and alert   Airway and Oxygen Therapy: Patient Spontanous Breathing  Post-op Pain: mild  Post-op Assessment: Post-op Vital signs reviewed, Patient's Cardiovascular Status Stable, Respiratory Function Stable, Patent Airway and No signs of Nausea or vomiting  Post-op Vital Signs: stable  Complications: No apparent anesthesia complications

## 2011-04-19 NOTE — Transfer of Care (Signed)
Immediate Anesthesia Transfer of Care Note  Patient: [name]  Procedure(s) Performed: Procedure(s) (LRB): ESOPHAGOGASTRODUODENOSCOPY (EGD) (N/A) BALLOON DILATION (N/A)  Patient Location: Endoscopy Unit  Anesthesia Type: MAC  Level of Consciousness: awake, sedated and patient cooperative  Airway & Oxygen Therapy: Patient Spontanous Breathing and Patient connected to nasal cannula oxygen  Post-op Assessment: Report given to PACU RN and Post -op Vital signs reviewed and stable  Post vital signs: Reviewed and stable  Complications: No apparent anesthesia complications

## 2011-04-19 NOTE — Anesthesia Preprocedure Evaluation (Addendum)
Anesthesia Evaluation  Patient identified by MRN, date of birth, ID band Patient awake    Reviewed: Allergy & Precautions, H&P , NPO status , Patient's Chart, lab work & pertinent test results  Airway Mallampati: II TM Distance: >3 FB Neck ROM: full    Dental No notable dental hx.    Pulmonary neg pulmonary ROS,  clear to auscultation  Pulmonary exam normal       Cardiovascular Exercise Tolerance: Good hypertension, neg cardio ROS regular Normal    Neuro/Psych  Headaches, PSYCHIATRIC DISORDERS Depression  Neuromuscular disease Negative Neurological ROS  Negative Psych ROS   GI/Hepatic negative GI ROS, Neg liver ROS,   Endo/Other  Negative Endocrine ROS  Renal/GU negative Renal ROS  Genitourinary negative   Musculoskeletal   Abdominal   Peds  Hematology negative hematology ROS (+)   Anesthesia Other Findings   Reproductive/Obstetrics negative OB ROS                           Anesthesia Physical Anesthesia Plan  ASA: II  Anesthesia Plan: MAC   Post-op Pain Management:    Induction:   Airway Management Planned:   Additional Equipment:   Intra-op Plan:   Post-operative Plan:   Informed Consent: I have reviewed the patients History and Physical, chart, labs and discussed the procedure including the risks, benefits and alternatives for the proposed anesthesia with the patient or authorized representative who has indicated his/her understanding and acceptance.   Dental Advisory Given  Plan Discussed with: CRNA  Anesthesia Plan Comments:         Anesthesia Quick Evaluation

## 2011-04-20 ENCOUNTER — Encounter (HOSPITAL_COMMUNITY): Payer: Self-pay | Admitting: Gastroenterology

## 2011-05-29 ENCOUNTER — Other Ambulatory Visit: Payer: Self-pay | Admitting: Internal Medicine

## 2011-05-29 DIAGNOSIS — Z1231 Encounter for screening mammogram for malignant neoplasm of breast: Secondary | ICD-10-CM

## 2011-06-08 ENCOUNTER — Other Ambulatory Visit: Payer: Self-pay | Admitting: Internal Medicine

## 2011-06-08 DIAGNOSIS — N289 Disorder of kidney and ureter, unspecified: Secondary | ICD-10-CM

## 2011-06-12 ENCOUNTER — Ambulatory Visit
Admission: RE | Admit: 2011-06-12 | Discharge: 2011-06-12 | Disposition: A | Discharge status: Home / Self Care | Payer: Medicare Other | Source: Ambulatory Visit | Attending: Internal Medicine | Admitting: Internal Medicine

## 2011-06-12 DIAGNOSIS — N289 Disorder of kidney and ureter, unspecified: Secondary | ICD-10-CM

## 2011-06-14 ENCOUNTER — Ambulatory Visit
Admission: RE | Admit: 2011-06-14 | Discharge: 2011-06-14 | Disposition: A | Discharge status: Home / Self Care | Payer: Medicare Other | Source: Ambulatory Visit | Attending: Internal Medicine | Admitting: Internal Medicine

## 2011-06-14 DIAGNOSIS — Z1231 Encounter for screening mammogram for malignant neoplasm of breast: Secondary | ICD-10-CM

## 2011-11-29 ENCOUNTER — Ambulatory Visit (INDEPENDENT_AMBULATORY_CARE_PROVIDER_SITE_OTHER): Payer: Medicare Other

## 2011-11-29 DIAGNOSIS — Z23 Encounter for immunization: Secondary | ICD-10-CM

## 2011-12-13 ENCOUNTER — Ambulatory Visit: Payer: Medicare Other | Attending: Orthopedic Surgery | Admitting: Physical Therapy

## 2011-12-13 DIAGNOSIS — M2550 Pain in unspecified joint: Secondary | ICD-10-CM | POA: Insufficient documentation

## 2011-12-13 DIAGNOSIS — IMO0001 Reserved for inherently not codable concepts without codable children: Secondary | ICD-10-CM | POA: Insufficient documentation

## 2011-12-13 DIAGNOSIS — Z96649 Presence of unspecified artificial hip joint: Secondary | ICD-10-CM | POA: Insufficient documentation

## 2011-12-13 DIAGNOSIS — R5381 Other malaise: Secondary | ICD-10-CM | POA: Insufficient documentation

## 2011-12-13 DIAGNOSIS — Z96659 Presence of unspecified artificial knee joint: Secondary | ICD-10-CM | POA: Insufficient documentation

## 2011-12-13 DIAGNOSIS — M2560 Stiffness of unspecified joint, not elsewhere classified: Secondary | ICD-10-CM | POA: Insufficient documentation

## 2011-12-18 ENCOUNTER — Encounter: Payer: Medicare Other | Admitting: Physical Therapy

## 2011-12-18 ENCOUNTER — Ambulatory Visit: Payer: Medicare Other

## 2011-12-20 ENCOUNTER — Ambulatory Visit: Payer: Medicare Other

## 2011-12-21 ENCOUNTER — Ambulatory Visit (INDEPENDENT_AMBULATORY_CARE_PROVIDER_SITE_OTHER): Payer: Self-pay | Admitting: General Surgery

## 2011-12-22 ENCOUNTER — Ambulatory Visit (INDEPENDENT_AMBULATORY_CARE_PROVIDER_SITE_OTHER): Payer: Medicare Other | Admitting: General Surgery

## 2011-12-22 ENCOUNTER — Encounter (INDEPENDENT_AMBULATORY_CARE_PROVIDER_SITE_OTHER): Payer: Self-pay | Admitting: General Surgery

## 2011-12-22 VITALS — BP 130/82 | HR 96 | Temp 97.2°F | Resp 18 | Ht 65.25 in | Wt 133.0 lb

## 2011-12-22 DIAGNOSIS — R197 Diarrhea, unspecified: Secondary | ICD-10-CM

## 2011-12-22 DIAGNOSIS — K529 Noninfective gastroenteritis and colitis, unspecified: Secondary | ICD-10-CM | POA: Insufficient documentation

## 2011-12-22 MED ORDER — COLESEVELAM HCL 625 MG PO TABS: 1875.0000 mg | ORAL_TABLET | Freq: Two times a day (BID) | ORAL | Status: DC

## 2011-12-22 NOTE — Progress Notes (Signed)
No chief complaint on file.   HISTORY:  [name] is a 78 y.o. female who presents to clinic with chronic diarrhea.  This has been present since her total abd colectomy in 2004.  It was well-controlled with tincture of opium but she is having trouble getting that now.  She has recently had some trouble with acute and chronic UTI's.  Her urologist referred her to me for evaluation of this diarrhea.  She states that she uses Loperamide mostly to control her diarrhea, but she had to limit her diet by size and frequency to keep from having multiple episodes of diarrhea and cramping.  She denies any skin breakdown or anal pain.  She occasionally has some fecal leakage.    Past Medical History  Diagnosis Date  . Hypertension   . Chronic diarrhea   . Arthritis   . Osteoporosis   . Goiter   . Depression   . Neuropathy   . UTI (lower urinary tract infection)   . Headache     sinus  . Cancer     melanoma       Past Surgical History  Procedure Date  . Abdominal hysterectomy   . Total knee arthroplasty   . Bladder surgery   . Colon surgery   . Appendectomy   . Hip replacement-bilateral   . Esophagogastroduodenoscopy 04/19/2011    Procedure: ESOPHAGOGASTRODUODENOSCOPY (EGD);  Surgeon: Freddy Jaksch, MD;  Location: [facility];  Service: Endoscopy;  Laterality: N/A;  mac   . Balloon dilation 04/19/2011    Procedure: BALLOON DILATION;  Surgeon: Freddy Jaksch, MD;  Location: [facility];  Service: Endoscopy;  Laterality: N/A;  . Joint replacement       Current Outpatient Prescriptions  Medication Sig Dispense Refill  . acetaminophen (TYLENOL) 500 MG tablet Take 500 mg by mouth every 6 (six) hours as needed.      . calcium carbonate (OS-CAL) 600 MG TABS Take 600 mg by mouth daily.      . cholecalciferol (VITAMIN D) 1000 UNITS tablet Take 2,000 Units by mouth daily.      . diphenoxylate-atropine (LOMOTIL) 2.5-0.025 MG per tablet Take 1 tablet by mouth 4 (four) times daily as needed.       . gabapentin (NEURONTIN) 100 MG capsule Take 100 mg by mouth as needed.      Marland Kitchen glycopyrrolate (ROBINUL) 1 MG tablet Take 1 mg by mouth 3 (three) times daily.      Marland Kitchen HYDROcodone-acetaminophen (VICODIN) 5-500 MG per tablet Take 1 tablet by mouth every 6 (six) hours as needed.      . hydroxychloroquine (PLAQUENIL) 200 MG tablet Take 400 mg by mouth at bedtime.      . lidocaine (LIDODERM) 5 % Place 1 patch onto the skin as needed. Remove & Discard patch within 12 hours or as directed by MD      . Multiple Vitamins-Minerals (MULTIVITAMIN WITH MINERALS) tablet Take 1 tablet by mouth daily.      . nitrofurantoin (MACRODANTIN) 100 MG capsule Take 100 mg by mouth as needed.      Marland Kitchen omeprazole (PRILOSEC) 20 MG capsule Take 1 capsule (20 mg total) by mouth daily.      . valsartan-hydrochlorothiazide (DIOVAN-HCT) 80-12.5 MG per tablet Take 1 tablet by mouth daily.      . colesevelam (WELCHOL) 625 MG tablet Take 3 tablets (1,875 mg total) by mouth 2 (two) times daily with a meal.  30 tablet  2     Allergies  Allergen Reactions  . Amoxicillin-Pot Clavulanate     diarrhea  . Hydromorphone   . Lisinopril     fatigue  . Septra (Bactrim)       No family history on file.    History   Social History  . Marital Status: Divorced    Spouse Name: N/A    Number of Children: N/A  . Years of Education: N/A   Social History Main Topics  . Smoking status: Former Smoker    Quit date: 02/28/1968  . Smokeless tobacco: None  . Alcohol Use: Yes  . Drug Use: No  . Sexually Active: None   Other Topics Concern  . None   Social History Narrative  . None       REVIEW OF SYSTEMS -  Review of Systems - General ROS: negative for - chills, fever or weight gain Hematological and Lymphatic ROS: negative for - bleeding problems, blood clots or bruising Gastrointestinal ROS: no abdominal pain, change in bowel habits, or black or bloody stools Genito-Urinary ROS: positive for - dysuria negative for -  hematuria  EXAM: Filed Vitals:   12/22/11 1333  BP: 130/82  Pulse: 96  Temp: 97.2 F (36.2 C)  Resp: 18    General appearance: alert, cooperative and no distress Resp: clear to auscultation bilaterally Cardio: regular rate and rhythm GI: soft, non-tender; bowel sounds normal; no masses,  no organomegaly Well healed midline abd scar, no hernias palpated  ASSESSMENT AND PLAN: [name] is a 78 y.o. F who presents for chronic diarrhea since her total abdominal colectomy almost 10 years ago.  She is also having frequent UTI's, and her diarrhea is thought to be contributing.  We discussed multiple methods for her to control this better.  This included starting Welchol, Iron supplements and soluble fiber supplements.  She is going to try these at home for 1 week at a time and then she will see me back in 6 weeks to see how things are doing.      Vanita Panda, MD Colon and Rectal Surgery / General Surgery [facility]      Visit Diagnoses: 1. Chronic diarrhea     Primary Care Physician: Darnelle Bos, MD

## 2011-12-22 NOTE — Patient Instructions (Signed)
GETTING TO GOOD BOWEL HEALTH. Irregular bowel habits such as diarrhea can lead to many problems over time.  Having one soft, formed bowel movement a day is the most important way to prevent further problems.  The anorectal canal is designed to handle stretching and feces to safely manage our ability to get rid of solid waste (feces, poop, stool) out of our body.  BUT, diarrhea can be a burning fire to this very sensitive area of our body, causing inflamed hemorrhoids, anal fissures, increasing risk is perirectal abscesses, abdominal pain and bloating.     The goal: ONE SOFT BOWEL MOVEMENT A DAY!  To have soft, regular bowel movements:    Drink plenty of water a day.   o Take plenty of fiber.    Bulking Agents -- This type of water-retaining fiber generally is easily obtained each day by one of the following:  o Psyllium bran -- The psyllium plant is remarkable because its ground seeds can retain so much water. This product is available as Metamucil, Konsyl, Effersyllium, Per Diem Fiber, or the less expensive generic preparation in drug and health food stores. Although labeled a laxative, it really is not a laxative.  o Methylcellulose -- This is another fiber derived from wood which also retains water. It is available as Citrucel. o Flax Seed - a less gassy fiber than psyllium   Controlling diarrhea o Avoid dairy products (especially milk & ice cream) for a short time.  The intestines often can lose the ability to digest lactose when stressed. o Avoid foods that cause gassiness or bloating.    Every person has some sensitivity to other foods, so listen to our body and avoid those foods that trigger problems for you. o Adding fiber (Citrucel, Metamucil, psyllium, Miralax) gradually can help thicken stools by absorbing excess fluid and retrain the intestines to act more normally.  Slowly increase the dose over a few weeks.  Too much fiber too soon can backfire and cause cramping & bloating. o Probiotics  (such as active yogurt, Align, etc) may help repopulate the intestines and colon with normal bacteria and calm down a sensitive digestive tract.  Most studies show it to be of mild help, though, and such products can be costly. o Medicines:   Bismuth subsalicylate (ex. Kayopectate, Pepto Bismol) every 30 minutes for up to 6 doses can help control diarrhea.  Avoid if pregnant.   Loperamide (Immodium) can slow down diarrhea.  Start with two tablets (4mg  total) first and then try one tablet every 6 hours.  Avoid if you are having fevers or severe pain.  If you are not better or start feeling worse, stop all medicines and call your doctor for advice   [name] is a prescription medication that can help as well.  I will give you a trial prescription of this as well   You may also try Iron supplements.  These are naturally constipating and can help diarrhea quite a bit o Call your doctor if you are getting worse or not better.    Start by adding a new medication or supplement for one week at a time.  If things are better, you can continue.  If your diarrhea is worse or about the same, try a new medication the next week.

## 2011-12-25 ENCOUNTER — Encounter: Payer: Medicare Other | Admitting: Physical Therapy

## 2011-12-25 ENCOUNTER — Ambulatory Visit: Payer: Medicare Other | Admitting: Physical Therapy

## 2011-12-27 ENCOUNTER — Ambulatory Visit: Payer: Medicare Other | Admitting: Physical Therapy

## 2012-01-01 ENCOUNTER — Ambulatory Visit: Payer: Medicare Other | Admitting: Physical Therapy

## 2012-01-02 ENCOUNTER — Ambulatory Visit: Payer: Medicare Other | Attending: Orthopedic Surgery | Admitting: Physical Therapy

## 2012-01-02 DIAGNOSIS — Z96659 Presence of unspecified artificial knee joint: Secondary | ICD-10-CM | POA: Insufficient documentation

## 2012-01-02 DIAGNOSIS — IMO0001 Reserved for inherently not codable concepts without codable children: Secondary | ICD-10-CM | POA: Insufficient documentation

## 2012-01-02 DIAGNOSIS — M2560 Stiffness of unspecified joint, not elsewhere classified: Secondary | ICD-10-CM | POA: Insufficient documentation

## 2012-01-02 DIAGNOSIS — M2550 Pain in unspecified joint: Secondary | ICD-10-CM | POA: Insufficient documentation

## 2012-01-02 DIAGNOSIS — Z96649 Presence of unspecified artificial hip joint: Secondary | ICD-10-CM | POA: Insufficient documentation

## 2012-01-02 DIAGNOSIS — R5381 Other malaise: Secondary | ICD-10-CM | POA: Insufficient documentation

## 2012-01-03 ENCOUNTER — Ambulatory Visit: Payer: Medicare Other | Admitting: Physical Therapy

## 2012-01-08 ENCOUNTER — Encounter: Payer: Medicare Other | Admitting: Physical Therapy

## 2012-01-09 ENCOUNTER — Ambulatory Visit: Payer: Medicare Other | Admitting: Physical Therapy

## 2012-01-30 ENCOUNTER — Encounter (INDEPENDENT_AMBULATORY_CARE_PROVIDER_SITE_OTHER): Payer: Self-pay | Admitting: General Surgery

## 2012-01-30 ENCOUNTER — Ambulatory Visit (INDEPENDENT_AMBULATORY_CARE_PROVIDER_SITE_OTHER): Payer: Medicare Other | Admitting: General Surgery

## 2012-01-30 VITALS — BP 120/70 | HR 78 | Temp 96.0°F | Resp 18 | Wt 130.0 lb

## 2012-01-30 DIAGNOSIS — K529 Noninfective gastroenteritis and colitis, unspecified: Secondary | ICD-10-CM

## 2012-01-30 DIAGNOSIS — R197 Diarrhea, unspecified: Secondary | ICD-10-CM

## 2012-01-30 NOTE — Progress Notes (Signed)
[name] is a 78 y.o. female who is here for a follow up visit regarding her chronic diarrhea.  She is doing better.  She even had a little constipation a few weeks ago.  She is only taking welchol.  She has not tried the iron or the fiber.    Objective: Filed Vitals:   01/30/12 1221  BP: 120/70  Pulse: 78  Temp: 96 F (35.6 C)  Resp: 18    General appearance: alert and cooperative GI: soft, non-tender; bowel sounds normal; no masses,  no organomegaly   Assessment and Plan: Continue the welchol.  Use atropine at night as needed for cramping.  Use fiber and iron as needed.  Return to office if these options stop working.    Vanita Panda, MD [facility], [place] [phone]

## 2012-01-30 NOTE — Patient Instructions (Signed)
Continue welchol with your other medications.  Ok to use iron and fiber as needed for diarrhea.  Return to the office if these do no control your symptoms

## 2012-02-07 ENCOUNTER — Other Ambulatory Visit: Payer: Self-pay | Admitting: Gastroenterology

## 2012-02-07 DIAGNOSIS — R1310 Dysphagia, unspecified: Secondary | ICD-10-CM

## 2012-02-19 ENCOUNTER — Other Ambulatory Visit: Payer: Medicare Other

## 2012-03-01 ENCOUNTER — Ambulatory Visit
Admission: RE | Admit: 2012-03-01 | Discharge: 2012-03-01 | Disposition: A | Discharge status: Home / Self Care | Payer: Medicare Other | Source: Ambulatory Visit | Attending: Gastroenterology | Admitting: Gastroenterology

## 2012-03-01 DIAGNOSIS — R1310 Dysphagia, unspecified: Secondary | ICD-10-CM

## 2012-03-05 ENCOUNTER — Other Ambulatory Visit: Payer: Self-pay | Admitting: Gastroenterology

## 2012-03-05 DIAGNOSIS — R1310 Dysphagia, unspecified: Secondary | ICD-10-CM

## 2012-03-05 DIAGNOSIS — E041 Nontoxic single thyroid nodule: Secondary | ICD-10-CM

## 2012-03-12 ENCOUNTER — Ambulatory Visit
Admission: RE | Admit: 2012-03-12 | Discharge: 2012-03-12 | Disposition: A | Discharge status: Home / Self Care | Payer: Medicare Other | Source: Ambulatory Visit | Attending: Gastroenterology | Admitting: Gastroenterology

## 2012-03-12 DIAGNOSIS — R1310 Dysphagia, unspecified: Secondary | ICD-10-CM

## 2012-03-12 DIAGNOSIS — E041 Nontoxic single thyroid nodule: Secondary | ICD-10-CM

## 2012-05-29 ENCOUNTER — Other Ambulatory Visit: Payer: Self-pay

## 2012-05-29 DIAGNOSIS — Z1231 Encounter for screening mammogram for malignant neoplasm of breast: Secondary | ICD-10-CM

## 2012-05-31 ENCOUNTER — Other Ambulatory Visit (HOSPITAL_COMMUNITY): Payer: Self-pay | Admitting: Urology

## 2012-05-31 DIAGNOSIS — K8020 Calculus of gallbladder without cholecystitis without obstruction: Secondary | ICD-10-CM

## 2012-06-10 ENCOUNTER — Ambulatory Visit (HOSPITAL_COMMUNITY)
Admission: RE | Admit: 2012-06-10 | Discharge: 2012-06-10 | Disposition: A | Discharge status: Home / Self Care | Payer: Medicare Other | Source: Ambulatory Visit | Attending: Urology | Admitting: Urology

## 2012-06-10 DIAGNOSIS — R109 Unspecified abdominal pain: Secondary | ICD-10-CM | POA: Insufficient documentation

## 2012-06-10 DIAGNOSIS — K8020 Calculus of gallbladder without cholecystitis without obstruction: Secondary | ICD-10-CM | POA: Insufficient documentation

## 2012-07-10 ENCOUNTER — Ambulatory Visit: Payer: Medicare Other

## 2012-07-16 ENCOUNTER — Ambulatory Visit
Admission: RE | Admit: 2012-07-16 | Discharge: 2012-07-16 | Disposition: A | Discharge status: Home / Self Care | Payer: Medicare Other | Source: Ambulatory Visit

## 2012-07-16 DIAGNOSIS — Z1231 Encounter for screening mammogram for malignant neoplasm of breast: Secondary | ICD-10-CM

## 2012-08-06 ENCOUNTER — Encounter (INDEPENDENT_AMBULATORY_CARE_PROVIDER_SITE_OTHER): Payer: Medicare Other | Admitting: General Surgery

## 2012-09-06 ENCOUNTER — Emergency Department (HOSPITAL_COMMUNITY)
Admission: EM | Admit: 2012-09-06 | Discharge: 2012-09-06 | Disposition: A | Discharge status: Home / Self Care | Payer: No Typology Code available for payment source | Attending: Emergency Medicine | Admitting: Emergency Medicine

## 2012-09-06 ENCOUNTER — Emergency Department (HOSPITAL_COMMUNITY): Payer: No Typology Code available for payment source

## 2012-09-06 ENCOUNTER — Encounter (HOSPITAL_COMMUNITY): Payer: Self-pay | Admitting: Nurse Practitioner

## 2012-09-06 DIAGNOSIS — M1990 Unspecified osteoarthritis, unspecified site: Secondary | ICD-10-CM

## 2012-09-06 DIAGNOSIS — Z79899 Other long term (current) drug therapy: Secondary | ICD-10-CM | POA: Insufficient documentation

## 2012-09-06 DIAGNOSIS — S6990XA Unspecified injury of unspecified wrist, hand and finger(s), initial encounter: Secondary | ICD-10-CM | POA: Insufficient documentation

## 2012-09-06 DIAGNOSIS — F3289 Other specified depressive episodes: Secondary | ICD-10-CM | POA: Insufficient documentation

## 2012-09-06 DIAGNOSIS — K529 Noninfective gastroenteritis and colitis, unspecified: Secondary | ICD-10-CM

## 2012-09-06 DIAGNOSIS — G589 Mononeuropathy, unspecified: Secondary | ICD-10-CM | POA: Insufficient documentation

## 2012-09-06 DIAGNOSIS — F329 Major depressive disorder, single episode, unspecified: Secondary | ICD-10-CM | POA: Insufficient documentation

## 2012-09-06 DIAGNOSIS — R197 Diarrhea, unspecified: Secondary | ICD-10-CM | POA: Insufficient documentation

## 2012-09-06 DIAGNOSIS — Z8639 Personal history of other endocrine, nutritional and metabolic disease: Secondary | ICD-10-CM | POA: Insufficient documentation

## 2012-09-06 DIAGNOSIS — M79641 Pain in right hand: Secondary | ICD-10-CM

## 2012-09-06 DIAGNOSIS — Z87891 Personal history of nicotine dependence: Secondary | ICD-10-CM | POA: Insufficient documentation

## 2012-09-06 DIAGNOSIS — Z87440 Personal history of urinary (tract) infections: Secondary | ICD-10-CM | POA: Insufficient documentation

## 2012-09-06 DIAGNOSIS — G629 Polyneuropathy, unspecified: Secondary | ICD-10-CM

## 2012-09-06 DIAGNOSIS — IMO0002 Reserved for concepts with insufficient information to code with codable children: Secondary | ICD-10-CM | POA: Insufficient documentation

## 2012-09-06 DIAGNOSIS — Z862 Personal history of diseases of the blood and blood-forming organs and certain disorders involving the immune mechanism: Secondary | ICD-10-CM | POA: Insufficient documentation

## 2012-09-06 DIAGNOSIS — I10 Essential (primary) hypertension: Secondary | ICD-10-CM

## 2012-09-06 DIAGNOSIS — M069 Rheumatoid arthritis, unspecified: Secondary | ICD-10-CM | POA: Insufficient documentation

## 2012-09-06 DIAGNOSIS — M19049 Primary osteoarthritis, unspecified hand: Secondary | ICD-10-CM | POA: Insufficient documentation

## 2012-09-06 DIAGNOSIS — Y9389 Activity, other specified: Secondary | ICD-10-CM | POA: Insufficient documentation

## 2012-09-06 DIAGNOSIS — Z85828 Personal history of other malignant neoplasm of skin: Secondary | ICD-10-CM | POA: Insufficient documentation

## 2012-09-06 DIAGNOSIS — Y92410 Unspecified street and highway as the place of occurrence of the external cause: Secondary | ICD-10-CM | POA: Insufficient documentation
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
## 2012-09-06 NOTE — ED Notes (Signed)
Discharge papers not ready 

## 2012-09-06 NOTE — ED Notes (Signed)
Patient transported to X-ray 

## 2012-09-06 NOTE — ED Notes (Signed)
Placed Tegaderm on hematoma on left hand index and ring finger.
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
## 2013-06-08 ENCOUNTER — Encounter: Payer: Self-pay | Admitting: *Deleted

## 2013-06-24 ENCOUNTER — Other Ambulatory Visit: Payer: Self-pay | Admitting: Internal Medicine

## 2013-06-24 ENCOUNTER — Ambulatory Visit
Admission: RE | Admit: 2013-06-24 | Discharge: 2013-06-24 | Disposition: A | Discharge status: Home / Self Care | Payer: Medicare Other | Source: Ambulatory Visit | Attending: Internal Medicine | Admitting: Internal Medicine

## 2013-06-24 ENCOUNTER — Encounter (INDEPENDENT_AMBULATORY_CARE_PROVIDER_SITE_OTHER): Payer: Self-pay

## 2013-06-24 DIAGNOSIS — M7989 Other specified soft tissue disorders: Secondary | ICD-10-CM

## 2013-07-25 ENCOUNTER — Other Ambulatory Visit: Payer: Self-pay

## 2013-07-25 DIAGNOSIS — Z1231 Encounter for screening mammogram for malignant neoplasm of breast: Secondary | ICD-10-CM

## 2013-07-29 ENCOUNTER — Ambulatory Visit: Payer: Medicare Other | Attending: Internal Medicine | Admitting: Physical Therapy

## 2013-07-29 DIAGNOSIS — Z9181 History of falling: Secondary | ICD-10-CM | POA: Diagnosis not present

## 2013-07-29 DIAGNOSIS — R269 Unspecified abnormalities of gait and mobility: Secondary | ICD-10-CM | POA: Diagnosis not present

## 2013-07-29 DIAGNOSIS — M6281 Muscle weakness (generalized): Secondary | ICD-10-CM | POA: Insufficient documentation

## 2013-07-29 DIAGNOSIS — IMO0001 Reserved for inherently not codable concepts without codable children: Secondary | ICD-10-CM | POA: Insufficient documentation

## 2013-08-05 ENCOUNTER — Ambulatory Visit: Payer: Medicare Other | Admitting: Physical Therapy

## 2013-08-05 DIAGNOSIS — IMO0001 Reserved for inherently not codable concepts without codable children: Secondary | ICD-10-CM | POA: Diagnosis not present

## 2013-08-06 ENCOUNTER — Ambulatory Visit
Admission: RE | Admit: 2013-08-06 | Discharge: 2013-08-06 | Disposition: A | Discharge status: Home / Self Care | Payer: Medicare Other | Source: Ambulatory Visit

## 2013-08-06 ENCOUNTER — Encounter (INDEPENDENT_AMBULATORY_CARE_PROVIDER_SITE_OTHER): Payer: Self-pay

## 2013-08-06 DIAGNOSIS — Z1231 Encounter for screening mammogram for malignant neoplasm of breast: Secondary | ICD-10-CM

## 2013-08-08 ENCOUNTER — Ambulatory Visit: Payer: Medicare Other | Admitting: Physical Therapy

## 2013-08-08 DIAGNOSIS — IMO0001 Reserved for inherently not codable concepts without codable children: Secondary | ICD-10-CM | POA: Diagnosis not present

## 2013-08-12 ENCOUNTER — Ambulatory Visit: Payer: Medicare Other | Admitting: Physical Therapy

## 2013-08-12 DIAGNOSIS — IMO0001 Reserved for inherently not codable concepts without codable children: Secondary | ICD-10-CM | POA: Diagnosis not present

## 2013-08-15 ENCOUNTER — Ambulatory Visit: Payer: Medicare Other | Admitting: Physical Therapy

## 2013-08-15 DIAGNOSIS — IMO0001 Reserved for inherently not codable concepts without codable children: Secondary | ICD-10-CM | POA: Diagnosis not present

## 2013-08-19 ENCOUNTER — Ambulatory Visit: Payer: Medicare Other | Admitting: Physical Therapy

## 2013-08-19 DIAGNOSIS — IMO0001 Reserved for inherently not codable concepts without codable children: Secondary | ICD-10-CM | POA: Diagnosis not present

## 2013-08-22 ENCOUNTER — Ambulatory Visit: Payer: Medicare Other | Admitting: Physical Therapy

## 2013-08-26 ENCOUNTER — Ambulatory Visit: Payer: Medicare Other | Admitting: Physical Therapy

## 2013-08-27 ENCOUNTER — Ambulatory Visit: Payer: Medicare Other | Admitting: Physical Therapy

## 2013-09-26 ENCOUNTER — Other Ambulatory Visit: Payer: Self-pay | Admitting: Gastroenterology

## 2013-09-26 DIAGNOSIS — R4702 Dysphasia: Secondary | ICD-10-CM

## 2013-09-26 DIAGNOSIS — K449 Diaphragmatic hernia without obstruction or gangrene: Secondary | ICD-10-CM

## 2013-09-30 ENCOUNTER — Ambulatory Visit
Admission: RE | Admit: 2013-09-30 | Discharge: 2013-09-30 | Disposition: A | Discharge status: Home / Self Care | Payer: Medicare Other | Source: Ambulatory Visit | Attending: Gastroenterology | Admitting: Gastroenterology

## 2013-09-30 ENCOUNTER — Other Ambulatory Visit: Payer: Self-pay | Admitting: Gastroenterology

## 2013-09-30 DIAGNOSIS — K449 Diaphragmatic hernia without obstruction or gangrene: Secondary | ICD-10-CM

## 2013-09-30 DIAGNOSIS — R4702 Dysphasia: Secondary | ICD-10-CM

## 2013-10-08 ENCOUNTER — Other Ambulatory Visit: Payer: Self-pay | Admitting: Dermatology

## 2013-10-14 ENCOUNTER — Other Ambulatory Visit: Payer: Self-pay | Admitting: Gastroenterology

## 2013-11-14 ENCOUNTER — Other Ambulatory Visit: Payer: Self-pay | Admitting: Gastroenterology

## 2013-11-14 DIAGNOSIS — R634 Abnormal weight loss: Secondary | ICD-10-CM

## 2013-11-14 DIAGNOSIS — R197 Diarrhea, unspecified: Secondary | ICD-10-CM

## 2013-11-19 ENCOUNTER — Other Ambulatory Visit: Payer: Medicare Other

## 2013-11-25 ENCOUNTER — Ambulatory Visit
Admission: RE | Admit: 2013-11-25 | Discharge: 2013-11-25 | Disposition: A | Discharge status: Home / Self Care | Payer: Medicare Other | Source: Ambulatory Visit | Attending: Gastroenterology | Admitting: Gastroenterology

## 2013-11-25 DIAGNOSIS — R634 Abnormal weight loss: Secondary | ICD-10-CM

## 2013-11-25 DIAGNOSIS — R197 Diarrhea, unspecified: Secondary | ICD-10-CM

## 2013-12-18 ENCOUNTER — Other Ambulatory Visit (HOSPITAL_COMMUNITY): Payer: Self-pay | Admitting: Rheumatology

## 2013-12-18 ENCOUNTER — Ambulatory Visit (HOSPITAL_COMMUNITY)
Admission: RE | Admit: 2013-12-18 | Discharge: 2013-12-18 | Disposition: A | Discharge status: Home / Self Care | Payer: Medicare Other | Source: Ambulatory Visit | Attending: Rheumatology | Admitting: Rheumatology

## 2013-12-18 DIAGNOSIS — J449 Chronic obstructive pulmonary disease, unspecified: Secondary | ICD-10-CM | POA: Diagnosis not present

## 2013-12-18 DIAGNOSIS — I10 Essential (primary) hypertension: Secondary | ICD-10-CM | POA: Insufficient documentation

## 2013-12-18 DIAGNOSIS — R0602 Shortness of breath: Secondary | ICD-10-CM | POA: Diagnosis present

## 2014-01-13 ENCOUNTER — Ambulatory Visit (INDEPENDENT_AMBULATORY_CARE_PROVIDER_SITE_OTHER): Payer: Medicare Other | Admitting: Family Medicine

## 2014-01-13 ENCOUNTER — Ambulatory Visit: Payer: Medicare Other

## 2014-01-13 DIAGNOSIS — Z23 Encounter for immunization: Secondary | ICD-10-CM

## 2014-07-29 ENCOUNTER — Other Ambulatory Visit: Payer: Self-pay

## 2014-07-29 DIAGNOSIS — Z1231 Encounter for screening mammogram for malignant neoplasm of breast: Secondary | ICD-10-CM

## 2014-08-14 ENCOUNTER — Ambulatory Visit
Admission: RE | Admit: 2014-08-14 | Discharge: 2014-08-14 | Disposition: A | Discharge status: Home / Self Care | Payer: Medicare Other | Source: Ambulatory Visit

## 2014-08-14 DIAGNOSIS — Z1231 Encounter for screening mammogram for malignant neoplasm of breast: Secondary | ICD-10-CM

## 2014-08-18 ENCOUNTER — Encounter (HOSPITAL_COMMUNITY)
Admission: RE | Admit: 2014-08-18 | Discharge: 2014-08-18 | Disposition: A | Discharge status: Home / Self Care | Payer: Medicare Other | Source: Ambulatory Visit | Attending: Orthopedic Surgery | Admitting: Orthopedic Surgery

## 2014-08-18 ENCOUNTER — Encounter (HOSPITAL_COMMUNITY): Payer: Self-pay

## 2014-08-18 DIAGNOSIS — I10 Essential (primary) hypertension: Secondary | ICD-10-CM | POA: Diagnosis not present

## 2014-08-18 DIAGNOSIS — Z01810 Encounter for preprocedural cardiovascular examination: Secondary | ICD-10-CM | POA: Insufficient documentation

## 2014-08-18 DIAGNOSIS — M129 Arthropathy, unspecified: Secondary | ICD-10-CM | POA: Insufficient documentation

## 2014-08-18 DIAGNOSIS — Z01812 Encounter for preprocedural laboratory examination: Secondary | ICD-10-CM | POA: Insufficient documentation

## 2014-08-18 DIAGNOSIS — M75102 Unspecified rotator cuff tear or rupture of left shoulder, not specified as traumatic: Secondary | ICD-10-CM | POA: Diagnosis not present

## 2014-08-18 DIAGNOSIS — Z85820 Personal history of malignant melanoma of skin: Secondary | ICD-10-CM | POA: Insufficient documentation

## 2014-08-18 DIAGNOSIS — K219 Gastro-esophageal reflux disease without esophagitis: Secondary | ICD-10-CM | POA: Insufficient documentation

## 2014-08-18 DIAGNOSIS — Z01818 Encounter for other preprocedural examination: Secondary | ICD-10-CM | POA: Insufficient documentation

## 2014-08-18 DIAGNOSIS — Z87891 Personal history of nicotine dependence: Secondary | ICD-10-CM | POA: Diagnosis not present

## 2014-08-18 DIAGNOSIS — J449 Chronic obstructive pulmonary disease, unspecified: Secondary | ICD-10-CM | POA: Diagnosis not present

## 2014-08-18 HISTORY — DX: Chronic obstructive pulmonary disease, unspecified: J44.9

## 2014-08-18 HISTORY — DX: Myoneural disorder, unspecified: G70.9

## 2014-08-18 HISTORY — DX: Other complications of anesthesia, initial encounter: T88.59XA

## 2014-08-18 HISTORY — DX: Personal history of other medical treatment: Z92.89

## 2014-08-18 HISTORY — DX: Anemia, unspecified: D64.9

## 2014-08-18 HISTORY — DX: Reserved for inherently not codable concepts without codable children: IMO0001

## 2014-08-18 HISTORY — DX: Gastro-esophageal reflux disease without esophagitis: K21.9

## 2014-08-18 HISTORY — DX: Adverse effect of unspecified anesthetic, initial encounter: T41.45XA

## 2014-08-18 LAB — COMPREHENSIVE METABOLIC PANEL
ALK PHOS: 76 U/L (ref 38–126)
ALT: 14 U/L (ref 14–54)
AST: 22 U/L (ref 15–41)
Albumin: 3.8 g/dL (ref 3.5–5.0)
Anion gap: 10 (ref 5–15)
BUN: 34 mg/dL — ABNORMAL HIGH (ref 6–20)
CALCIUM: 9.7 mg/dL (ref 8.9–10.3)
CO2: 30 mmol/L (ref 22–32)
Chloride: 99 mmol/L — ABNORMAL LOW (ref 101–111)
Creatinine, Ser: 1.23 mg/dL — ABNORMAL HIGH (ref 0.44–1.00)
GFR calc non Af Amer: 40 mL/min — ABNORMAL LOW (ref >60)
GFR, EST AFRICAN AMERICAN: 46 mL/min — AB (ref >60)
GLUCOSE: 107 mg/dL — AB (ref 65–99)
POTASSIUM: 4.2 mmol/L (ref 3.5–5.1)
Sodium: 139 mmol/L (ref 135–145)
Total Bilirubin: 0.6 mg/dL (ref 0.3–1.2)
Total Protein: 6.8 g/dL (ref 6.5–8.1)

## 2014-08-18 LAB — CBC WITH DIFFERENTIAL/PLATELET
BASOS PCT: 1 % (ref 0–1)
Basophils Absolute: 0 10*3/uL (ref 0.0–0.1)
EOS ABS: 0.1 10*3/uL (ref 0.0–0.7)
Eosinophils Relative: 2 % (ref 0–5)
HEMATOCRIT: 39 % (ref 36.0–46.0)
Hemoglobin: 12.9 g/dL (ref 12.0–15.0)
Lymphocytes Relative: 22 % (ref 12–46)
Lymphs Abs: 1.2 10*3/uL (ref 0.7–4.0)
MCH: 31.5 pg (ref 26.0–34.0)
MCHC: 33.1 g/dL (ref 30.0–36.0)
MCV: 95.4 fL (ref 78.0–100.0)
MONOS PCT: 8 % (ref 3–12)
Monocytes Absolute: 0.5 10*3/uL (ref 0.1–1.0)
NEUTROS ABS: 3.9 10*3/uL (ref 1.7–7.7)
Neutrophils Relative %: 67 % (ref 43–77)
Platelets: 222 10*3/uL (ref 150–400)
RBC: 4.09 MIL/uL (ref 3.87–5.11)
RDW: 13.5 % (ref 11.5–15.5)
WBC: 5.7 10*3/uL (ref 4.0–10.5)

## 2014-08-18 LAB — SURGICAL PCR SCREEN
MRSA, PCR: NEGATIVE
Staphylococcus aureus: NEGATIVE

## 2014-08-18 LAB — PROTIME-INR
INR: 1.02 (ref 0.00–1.49)
Prothrombin Time: 13.6 seconds (ref 11.6–15.2)

## 2014-08-18 LAB — APTT: aPTT: 27 seconds (ref 24–37)

## 2014-08-18 NOTE — Progress Notes (Signed)
Pt. Thinks she had stress test at one time, unsure where or when, she remarks that she might have had it in [place] prior to moving here in 2002.  Pt. Needing repeat of instructions a couple of times, states " this is a lot of information".  Pt. Reports that she will not be able to wash her back due to limited movement, reassured pt. That she should do the best she can. Pt. PCP is Dr. Felipa Eth & she is having difficulty getting the amount of Lomotil that she needs.  She states that she will go to her GI Doc- [facility] & he will order it.  Pt. Reports that she will pick up Rx for UTI today, that its waiting at pharmacy.

## 2014-08-18 NOTE — Pre-Procedure Instructions (Signed)
[name]  08/18/2014      [facility] - [place], [place] - [address] [place] [address] Phone: [phone] Fax: [phone]    Your procedure is scheduled on Thursday   June 30/2016   Report to [facility] Admitting at 5:30 A.M.   Call this number if you have problems the morning of surgery:   [phone]   Remember:  Do not eat food or drink liquids after midnight.  On Wednesday   Take these medicines the morning of surgery with A SIP OF WATER   NONE   Do not wear jewelry, make-up or nail polish.   Do not wear lotions, powders, or perfumes.     Do not shave 48 hours prior to surgery.    Do not bring valuables to the hospital.   [facility] is not responsible for any belongings or valuables.   Contacts, dentures or bridgework may not be worn into surgery.  Leave your suitcase in the car.  After surgery it may be brought to your room.  For patients admitted to the hospital, discharge time will be determined by your treatment team.  Patients discharged the day of surgery will not be allowed to drive home.   Name and phone number of your driver:   Emergency contact only- [place]   Special instructions:  Special Instructions: [facility] - Preparing for Surgery  Before surgery, you can play an important role.  Because skin is not sterile, your skin needs to be as free of germs as possible.  You can reduce the number of germs on you skin by washing with CHG (chlorahexidine gluconate) soap before surgery.  CHG is an antiseptic cleaner which kills germs and bonds with the skin to continue killing germs even after washing.  Please DO NOT use if you have an allergy to CHG or antibacterial soaps.  If your skin becomes reddened/irritated stop using the CHG and inform your nurse when you arrive at Short Stay.  Do not shave (including legs and underarms) for at least 48 hours prior to the first  CHG shower.  You may shave your face.  Please follow these instructions carefully:   1.  Shower with CHG Soap the night before surgery and the  morning of Surgery.  2.  If you choose to wash your hair, wash your hair first as usual with your  normal shampoo.  3.  After you shampoo, rinse your hair and body thoroughly to remove the  Shampoo.  4.  Use CHG as you would any other liquid soap.  You can apply chg directly to the skin and wash gently with scrungie or a clean washcloth.  5.  Apply the CHG Soap to your body ONLY FROM THE NECK DOWN.    Do not use on open wounds or open sores.  Avoid contact with your eyes, ears, mouth and genitals (private parts).  Wash genitals (private parts)   with your normal soap.  6.  Wash thoroughly, paying special attention to the area where your surgery will be performed.  7.  Thoroughly rinse your body with warm water from the neck down.  8.  DO NOT shower/wash with your normal soap after using and rinsing off   the CHG Soap.  9.  Pat yourself dry with a clean towel.            10.  Wear clean pajamas.  11.  Place clean sheets on your bed the night of your first shower and do not sleep with pets.  Day of Surgery  Do not apply any lotions/deodorants the morning of surgery.  Please wear clean clothes to the hospital/surgery center.  Please read over the following fact sheets that you were given. Pain Booklet, Coughing and Deep Breathing, MRSA Information and Surgical Site Infection Prevention

## 2014-08-19 NOTE — Progress Notes (Signed)
Anesthesia Chart Review:  Pt is 81 year old female scheduled for L reverse shoulder arthroplasty on 08/27/2014 with Dr. Onnie Graham.   PMH includes: HTN, COPD, anemia, melanoma, GERD, goiter. Currently has UTI, tells PAT RN she is to start tx for it 08/18/14. Former smoker. BMI 19.  Preoperative labs reviewed.    Chest x-ray 12/19/13 reviewed. COPD. No superimposed process demonstrated.   EKG 08/18/2014: NSR. Possible Left atrial enlargement. Left axis deviation. Left ventricular hypertrophy. Anteroseptal infarct, age undetermined. No significant change since last tracing 09/06/2012 per Dr. Percival Spanish.   If no changes, I anticipate pt can proceed with surgery as scheduled.   Willeen Cass, FNP-BC [facility]/Anesthesiology Phone: [phone] 08/19/2014 1:11 PM

## 2014-08-26 MED ORDER — LACTATED RINGERS IV SOLN
INTRAVENOUS | Status: DC
  Administered 2014-08-27 (×2): via INTRAVENOUS

## 2014-08-26 MED ORDER — CEFAZOLIN SODIUM-DEXTROSE 2-3 GM-% IV SOLR
2.0000 g | INTRAVENOUS | Status: AC
  Administered 2014-08-27: 2 g via INTRAVENOUS
  Filled 2014-08-26: qty 50

## 2014-08-27 ENCOUNTER — Inpatient Hospital Stay (HOSPITAL_COMMUNITY): Payer: Medicare Other | Admitting: Emergency Medicine

## 2014-08-27 ENCOUNTER — Encounter (HOSPITAL_COMMUNITY)
Admission: RE | Disposition: A | Discharge status: Skilled Nursing Facility | Payer: Self-pay | Source: Ambulatory Visit | Attending: Orthopedic Surgery

## 2014-08-27 ENCOUNTER — Encounter (HOSPITAL_COMMUNITY): Payer: Self-pay | Admitting: *Deleted

## 2014-08-27 ENCOUNTER — Inpatient Hospital Stay (HOSPITAL_COMMUNITY)
Admission: RE | Admit: 2014-08-27 | Discharge: 2014-08-29 | DRG: 483 | Disposition: A | Discharge status: Skilled Nursing Facility | Payer: Medicare Other | Source: Ambulatory Visit | Attending: Orthopedic Surgery | Admitting: Orthopedic Surgery

## 2014-08-27 ENCOUNTER — Inpatient Hospital Stay (HOSPITAL_COMMUNITY): Payer: Medicare Other | Admitting: Certified Registered Nurse Anesthetist

## 2014-08-27 DIAGNOSIS — Z96643 Presence of artificial hip joint, bilateral: Secondary | ICD-10-CM | POA: Diagnosis present

## 2014-08-27 DIAGNOSIS — Z87891 Personal history of nicotine dependence: Secondary | ICD-10-CM

## 2014-08-27 DIAGNOSIS — Z90710 Acquired absence of both cervix and uterus: Secondary | ICD-10-CM | POA: Diagnosis not present

## 2014-08-27 DIAGNOSIS — Z888 Allergy status to other drugs, medicaments and biological substances status: Secondary | ICD-10-CM

## 2014-08-27 DIAGNOSIS — Z79899 Other long term (current) drug therapy: Secondary | ICD-10-CM | POA: Diagnosis not present

## 2014-08-27 DIAGNOSIS — M129 Arthropathy, unspecified: Secondary | ICD-10-CM | POA: Diagnosis present

## 2014-08-27 DIAGNOSIS — Z885 Allergy status to narcotic agent status: Secondary | ICD-10-CM

## 2014-08-27 DIAGNOSIS — Z96653 Presence of artificial knee joint, bilateral: Secondary | ICD-10-CM | POA: Diagnosis present

## 2014-08-27 DIAGNOSIS — Z881 Allergy status to other antibiotic agents status: Secondary | ICD-10-CM | POA: Diagnosis not present

## 2014-08-27 DIAGNOSIS — M75102 Unspecified rotator cuff tear or rupture of left shoulder, not specified as traumatic: Secondary | ICD-10-CM | POA: Diagnosis present

## 2014-08-27 DIAGNOSIS — I10 Essential (primary) hypertension: Secondary | ICD-10-CM | POA: Diagnosis present

## 2014-08-27 DIAGNOSIS — Z880 Allergy status to penicillin: Secondary | ICD-10-CM | POA: Diagnosis not present

## 2014-08-27 DIAGNOSIS — J449 Chronic obstructive pulmonary disease, unspecified: Secondary | ICD-10-CM | POA: Diagnosis present

## 2014-08-27 DIAGNOSIS — Z96619 Presence of unspecified artificial shoulder joint: Secondary | ICD-10-CM

## 2014-08-27 HISTORY — PX: REVERSE SHOULDER ARTHROPLASTY: SHX5054

## 2014-08-27 SURGERY — ARTHROPLASTY, SHOULDER, TOTAL, REVERSE
Anesthesia: Regional | Site: Shoulder | Laterality: Left

## 2014-08-27 MED ORDER — IRBESARTAN 75 MG PO TABS
75.0000 mg | ORAL_TABLET | Freq: Every day | ORAL | Status: DC
Start: 2014-08-27 — End: 2014-08-29
  Administered 2014-08-28 – 2014-08-29 (×2): 75 mg via ORAL
  Filled 2014-08-27 (×3): qty 1

## 2014-08-27 MED ORDER — CHLORHEXIDINE GLUCONATE 4 % EX LIQD: 60.0000 mL | Freq: Once | CUTANEOUS | Status: DC

## 2014-08-27 MED ORDER — GLYCOPYRROLATE 1 MG PO TABS
1.0000 mg | ORAL_TABLET | Freq: Three times a day (TID) | ORAL | Status: DC
  Administered 2014-08-28 – 2014-08-29 (×5): 1 mg via ORAL
  Filled 2014-08-27 (×10): qty 1

## 2014-08-27 MED ORDER — MIDAZOLAM HCL 2 MG/2ML IJ SOLN
INTRAMUSCULAR | Status: AC
  Filled 2014-08-27: qty 2

## 2014-08-27 MED ORDER — KETOTIFEN FUMARATE 0.025 % OP SOLN
1.0000 [drp] | Freq: Every day | OPHTHALMIC | Status: DC
  Filled 2014-08-27: qty 5

## 2014-08-27 MED ORDER — POLYETHYLENE GLYCOL 3350 17 G PO PACK: 17.0000 g | PACK | Freq: Every day | ORAL | Status: DC | PRN

## 2014-08-27 MED ORDER — ACETAMINOPHEN 325 MG PO TABS: 325.0000 mg | ORAL_TABLET | ORAL | Status: DC | PRN

## 2014-08-27 MED ORDER — LOPERAMIDE HCL 2 MG PO CAPS
2.0000 mg | ORAL_CAPSULE | ORAL | Status: DC | PRN
Start: 2014-08-27 — End: 2014-08-29
  Filled 2014-08-27: qty 2

## 2014-08-27 MED ORDER — FAMOTIDINE-CA CARB-MAG HYDROX 10-800-165 MG PO CHEW: 1.0000 | CHEWABLE_TABLET | Freq: Every day | ORAL | Status: DC | PRN

## 2014-08-27 MED ORDER — SUCCINYLCHOLINE CHLORIDE 20 MG/ML IJ SOLN
INTRAMUSCULAR | Status: AC
  Filled 2014-08-27: qty 1

## 2014-08-27 MED ORDER — PHENOL 1.4 % MT LIQD: 1.0000 | OROMUCOSAL | Status: DC | PRN

## 2014-08-27 MED ORDER — METOCLOPRAMIDE HCL 5 MG/ML IJ SOLN: 5.0000 mg | Freq: Three times a day (TID) | INTRAMUSCULAR | Status: DC | PRN

## 2014-08-27 MED ORDER — ONDANSETRON HCL 4 MG/2ML IJ SOLN
INTRAMUSCULAR | Status: DC | PRN
  Administered 2014-08-27 (×2): 4 mg via INTRAVENOUS

## 2014-08-27 MED ORDER — DEXTROSE 5 % IV SOLN
10.0000 mg | INTRAVENOUS | Status: DC | PRN
  Administered 2014-08-27: 50 ug/min via INTRAVENOUS

## 2014-08-27 MED ORDER — SALINE SPRAY 0.65 % NA SOLN
1.0000 | NASAL | Status: DC | PRN
  Filled 2014-08-27: qty 44

## 2014-08-27 MED ORDER — ONDANSETRON HCL 4 MG/2ML IJ SOLN
INTRAMUSCULAR | Status: AC
  Filled 2014-08-27: qty 2

## 2014-08-27 MED ORDER — FENTANYL CITRATE (PF) 100 MCG/2ML IJ SOLN: 25.0000 ug | INTRAMUSCULAR | Status: DC | PRN

## 2014-08-27 MED ORDER — FENTANYL CITRATE (PF) 100 MCG/2ML IJ SOLN
INTRAMUSCULAR | Status: DC | PRN
  Administered 2014-08-27: 50 ug via INTRAVENOUS
  Administered 2014-08-27: 100 ug via INTRAVENOUS

## 2014-08-27 MED ORDER — PHENYLEPHRINE HCL 10 MG/ML IJ SOLN
INTRAMUSCULAR | Status: DC | PRN
  Administered 2014-08-27: 40 ug via INTRAVENOUS
  Administered 2014-08-27: 80 ug via INTRAVENOUS
  Administered 2014-08-27 (×2): 40 ug via INTRAVENOUS
  Administered 2014-08-27 (×2): 80 ug via INTRAVENOUS
  Administered 2014-08-27: 40 ug via INTRAVENOUS

## 2014-08-27 MED ORDER — WHITE PETROLATUM GEL
Status: AC
  Administered 2014-08-27: 0.2
  Filled 2014-08-27: qty 1

## 2014-08-27 MED ORDER — PHENYLEPHRINE HCL 10 MG/ML IJ SOLN: 10.0000 mg | INTRAVENOUS | Status: DC | PRN

## 2014-08-27 MED ORDER — ROCURONIUM BROMIDE 50 MG/5ML IV SOLN
INTRAVENOUS | Status: AC
  Filled 2014-08-27: qty 1

## 2014-08-27 MED ORDER — PROPOFOL 10 MG/ML IV BOLUS
INTRAVENOUS | Status: DC | PRN
  Administered 2014-08-27: 60 mg via INTRAVENOUS
  Administered 2014-08-27: 40 mg via INTRAVENOUS
  Administered 2014-08-27: 50 mg via INTRAVENOUS

## 2014-08-27 MED ORDER — CEFAZOLIN SODIUM-DEXTROSE 2-3 GM-% IV SOLR
2.0000 g | Freq: Four times a day (QID) | INTRAVENOUS | Status: AC
  Administered 2014-08-27 – 2014-08-28 (×3): 2 g via INTRAVENOUS
  Filled 2014-08-27 (×3): qty 50

## 2014-08-27 MED ORDER — DIAZEPAM 5 MG PO TABS
2.5000 mg | ORAL_TABLET | Freq: Four times a day (QID) | ORAL | Status: DC | PRN
  Administered 2014-08-28 (×3): 5 mg via ORAL
  Filled 2014-08-27 (×3): qty 1

## 2014-08-27 MED ORDER — ACETAMINOPHEN 325 MG PO TABS: 650.0000 mg | ORAL_TABLET | Freq: Four times a day (QID) | ORAL | Status: DC | PRN

## 2014-08-27 MED ORDER — MENTHOL 3 MG MT LOZG: 1.0000 | LOZENGE | OROMUCOSAL | Status: DC | PRN

## 2014-08-27 MED ORDER — NEOSTIGMINE METHYLSULFATE 10 MG/10ML IV SOLN
INTRAVENOUS | Status: DC | PRN
  Administered 2014-08-27: 4 mg via INTRAVENOUS

## 2014-08-27 MED ORDER — ALUM HYDROXIDE-MAG TRISILICATE 80-20 MG PO CHEW
1.0000 | CHEWABLE_TABLET | Freq: Every day | ORAL | Status: DC | PRN
  Filled 2014-08-27: qty 1

## 2014-08-27 MED ORDER — LIDOCAINE HCL (CARDIAC) 20 MG/ML IV SOLN
INTRAVENOUS | Status: DC | PRN
  Administered 2014-08-27: 40 mg via INTRAVENOUS

## 2014-08-27 MED ORDER — SODIUM CHLORIDE 0.9 % IR SOLN
Status: DC | PRN
  Administered 2014-08-27: 1000 mL

## 2014-08-27 MED ORDER — GLYCOPYRROLATE 0.2 MG/ML IJ SOLN
INTRAMUSCULAR | Status: AC
  Filled 2014-08-27: qty 3

## 2014-08-27 MED ORDER — BISACODYL 5 MG PO TBEC: 5.0000 mg | DELAYED_RELEASE_TABLET | Freq: Every day | ORAL | Status: DC | PRN

## 2014-08-27 MED ORDER — MAGNESIUM CITRATE PO SOLN: 1.0000 | Freq: Once | ORAL | Status: AC | PRN

## 2014-08-27 MED ORDER — CALCIUM CARBONATE ANTACID 500 MG PO CHEW: 1.0000 | CHEWABLE_TABLET | Freq: Every day | ORAL | Status: DC | PRN

## 2014-08-27 MED ORDER — MORPHINE SULFATE 2 MG/ML IJ SOLN
1.0000 mg | INTRAMUSCULAR | Status: DC | PRN
  Administered 2014-08-27 – 2014-08-28 (×2): 2 mg via INTRAVENOUS
  Filled 2014-08-27 (×2): qty 1

## 2014-08-27 MED ORDER — ALUM & MAG HYDROXIDE-SIMETH 200-200-20 MG/5ML PO SUSP: 30.0000 mL | ORAL | Status: DC | PRN

## 2014-08-27 MED ORDER — ONDANSETRON HCL 4 MG/2ML IJ SOLN: 4.0000 mg | Freq: Four times a day (QID) | INTRAMUSCULAR | Status: DC | PRN

## 2014-08-27 MED ORDER — OXYCODONE HCL 5 MG PO TABS
5.0000 mg | ORAL_TABLET | ORAL | Status: DC | PRN
  Administered 2014-08-27 – 2014-08-28 (×2): 10 mg via ORAL
  Administered 2014-08-28 (×2): 5 mg via ORAL
  Administered 2014-08-29 (×3): 10 mg via ORAL
  Filled 2014-08-27 (×4): qty 2
  Filled 2014-08-27: qty 1
  Filled 2014-08-27: qty 2
  Filled 2014-08-27: qty 1

## 2014-08-27 MED ORDER — DEXTROSE 5 % IV SOLN: 10.0000 mg | INTRAVENOUS | Status: DC | PRN

## 2014-08-27 MED ORDER — LACTATED RINGERS IV SOLN: INTRAVENOUS | Status: DC

## 2014-08-27 MED ORDER — GLYCOPYRROLATE 0.2 MG/ML IJ SOLN
INTRAMUSCULAR | Status: DC | PRN
  Administered 2014-08-27: 0.6 mg via INTRAVENOUS

## 2014-08-27 MED ORDER — ACETAMINOPHEN 160 MG/5ML PO SOLN
325.0000 mg | ORAL | Status: DC | PRN
  Filled 2014-08-27: qty 20.3

## 2014-08-27 MED ORDER — PROPOFOL 10 MG/ML IV BOLUS
INTRAVENOUS | Status: AC
  Filled 2014-08-27: qty 20

## 2014-08-27 MED ORDER — ONDANSETRON HCL 4 MG PO TABS
4.0000 mg | ORAL_TABLET | Freq: Four times a day (QID) | ORAL | Status: DC | PRN
  Administered 2014-08-27: 4 mg via ORAL
  Filled 2014-08-27: qty 1

## 2014-08-27 MED ORDER — ROCURONIUM BROMIDE 100 MG/10ML IV SOLN
INTRAVENOUS | Status: DC | PRN
  Administered 2014-08-27: 10 mg via INTRAVENOUS
  Administered 2014-08-27: 30 mg via INTRAVENOUS

## 2014-08-27 MED ORDER — NEOSTIGMINE METHYLSULFATE 10 MG/10ML IV SOLN
INTRAVENOUS | Status: AC
  Filled 2014-08-27: qty 1

## 2014-08-27 MED ORDER — PHENYLEPHRINE 40 MCG/ML (10ML) SYRINGE FOR IV PUSH (FOR BLOOD PRESSURE SUPPORT)
PREFILLED_SYRINGE | INTRAVENOUS | Status: AC
  Filled 2014-08-27: qty 10

## 2014-08-27 MED ORDER — DOCUSATE SODIUM 100 MG PO CAPS
100.0000 mg | ORAL_CAPSULE | Freq: Two times a day (BID) | ORAL | Status: DC
  Administered 2014-08-28 – 2014-08-29 (×3): 100 mg via ORAL
  Filled 2014-08-27 (×4): qty 1

## 2014-08-27 MED ORDER — BUPIVACAINE-EPINEPHRINE (PF) 0.5% -1:200000 IJ SOLN
INTRAMUSCULAR | Status: DC | PRN
  Administered 2014-08-27: 20 mL via PERINEURAL

## 2014-08-27 MED ORDER — METOCLOPRAMIDE HCL 5 MG PO TABS: 5.0000 mg | ORAL_TABLET | Freq: Three times a day (TID) | ORAL | Status: DC | PRN

## 2014-08-27 MED ORDER — TRAMADOL HCL 50 MG PO TABS
50.0000 mg | ORAL_TABLET | Freq: Four times a day (QID) | ORAL | Status: DC | PRN
  Administered 2014-08-27: 50 mg via ORAL
  Filled 2014-08-27: qty 1

## 2014-08-27 MED ORDER — FENTANYL CITRATE (PF) 250 MCG/5ML IJ SOLN
INTRAMUSCULAR | Status: AC
  Filled 2014-08-27: qty 5

## 2014-08-27 MED ORDER — PAREGORIC 2 MG/5ML PO TINC
5.0000 mL | Freq: Three times a day (TID) | ORAL | Status: DC
  Administered 2014-08-28 – 2014-08-29 (×4): 5 mL via ORAL
  Filled 2014-08-27 (×4): qty 5

## 2014-08-27 MED ORDER — ACETAMINOPHEN 650 MG RE SUPP: 650.0000 mg | Freq: Four times a day (QID) | RECTAL | Status: DC | PRN

## 2014-08-27 MED ORDER — FAMOTIDINE 20 MG PO TABS: 10.0000 mg | ORAL_TABLET | Freq: Every day | ORAL | Status: DC | PRN

## 2014-08-27 SURGICAL SUPPLY — 79 items
BIT DRILL 170X2.5X (BIT) IMPLANT
BIT DRL 170X2.5X (BIT)
BLADE SAW SGTL 83.5X18.5 (BLADE) ×2 IMPLANT
BOWL SMART MIX CTS (DISPOSABLE) ×2 IMPLANT
BRUSH FEMORAL CANAL (MISCELLANEOUS) IMPLANT
CAPT SHLDR REVTOTAL 1 ×2 IMPLANT
CEMENT BONE DEPUY (Cement) ×2 IMPLANT
COVER SURGICAL LIGHT HANDLE (MISCELLANEOUS) ×2 IMPLANT
DERMABOND ADHESIVE PROPEN (GAUZE/BANDAGES/DRESSINGS) ×1
DERMABOND ADVANCED (GAUZE/BANDAGES/DRESSINGS) ×1
DERMABOND ADVANCED .7 DNX12 (GAUZE/BANDAGES/DRESSINGS) ×1 IMPLANT
DERMABOND ADVANCED .7 DNX6 (GAUZE/BANDAGES/DRESSINGS) ×1 IMPLANT
DRAPE ORTHO SPLIT 77X108 STRL (DRAPES) ×2
DRAPE SURG 17X11 SM STRL (DRAPES) ×2 IMPLANT
DRAPE SURG ORHT 6 SPLT 77X108 (DRAPES) ×2 IMPLANT
DRAPE U-SHAPE 47X51 STRL (DRAPES) ×2 IMPLANT
DRILL 2.5 (BIT)
DRILL BIT 7/64X5 (BIT) ×2 IMPLANT
DRSG AQUACEL AG ADV 3.5X10 (GAUZE/BANDAGES/DRESSINGS) ×2 IMPLANT
DRSG MEPILEX BORDER 4X8 (GAUZE/BANDAGES/DRESSINGS) IMPLANT
DURAPREP 26ML APPLICATOR (WOUND CARE) ×2 IMPLANT
ELECT BLADE 4.0 EZ CLEAN MEGAD (MISCELLANEOUS) ×2
ELECT CAUTERY BLADE 6.4 (BLADE) ×2 IMPLANT
ELECT REM PT RETURN 9FT ADLT (ELECTROSURGICAL) ×2
ELECTRODE BLDE 4.0 EZ CLN MEGD (MISCELLANEOUS) ×1 IMPLANT
ELECTRODE REM PT RTRN 9FT ADLT (ELECTROSURGICAL) ×1 IMPLANT
FACESHIELD WRAPAROUND (MASK) ×4 IMPLANT
GLOVE BIO SURGEON STRL SZ7.5 (GLOVE) ×2 IMPLANT
GLOVE BIO SURGEON STRL SZ8 (GLOVE) ×2 IMPLANT
GLOVE BIOGEL PI IND STRL 6.5 (GLOVE) ×2 IMPLANT
GLOVE BIOGEL PI INDICATOR 6.5 (GLOVE) ×2
GLOVE EUDERMIC 7 POWDERFREE (GLOVE) ×2 IMPLANT
GLOVE SS BIOGEL STRL SZ 7.5 (GLOVE) ×2 IMPLANT
GLOVE SUPERSENSE BIOGEL SZ 7.5 (GLOVE) ×2
GLOVE SURG SS PI 6.5 STRL IVOR (GLOVE) ×4 IMPLANT
GOWN STRL REUS W/ TWL LRG LVL3 (GOWN DISPOSABLE) ×2 IMPLANT
GOWN STRL REUS W/ TWL XL LVL3 (GOWN DISPOSABLE) ×2 IMPLANT
GOWN STRL REUS W/TWL LRG LVL3 (GOWN DISPOSABLE) ×2
GOWN STRL REUS W/TWL XL LVL3 (GOWN DISPOSABLE) ×2
HANDPIECE INTERPULSE COAX TIP (DISPOSABLE)
KIT BASIN OR (CUSTOM PROCEDURE TRAY) ×2 IMPLANT
KIT ROOM TURNOVER OR (KITS) ×2 IMPLANT
MANIFOLD NEPTUNE II (INSTRUMENTS) ×2 IMPLANT
NDL SUT 6 .5 CRC .975X.05 MAYO (NEEDLE) ×1 IMPLANT
NEEDLE HYPO 25GX1X1/2 BEV (NEEDLE) ×2 IMPLANT
NEEDLE MAYO TAPER (NEEDLE) ×1
NS IRRIG 1000ML POUR BTL (IV SOLUTION) ×2 IMPLANT
PACK SHOULDER (CUSTOM PROCEDURE TRAY) ×2 IMPLANT
PAD ARMBOARD 7.5X6 YLW CONV (MISCELLANEOUS) ×4 IMPLANT
PASSER SUT SWANSON 36MM LOOP (INSTRUMENTS) IMPLANT
PIN GUIDE 1.2 (PIN) IMPLANT
PIN GUIDE GLENOPHERE 1.5MX300M (PIN) IMPLANT
PIN METAGLENE 2.5 (PIN) IMPLANT
PRESSURIZER FEMORAL UNIV (MISCELLANEOUS) IMPLANT
RESTRAINT HEAD UNIVERSAL NS (MISCELLANEOUS) ×2 IMPLANT
SET HNDPC FAN SPRY TIP SCT (DISPOSABLE) IMPLANT
SLING ARM FOAM STRAP LRG (SOFTGOODS) IMPLANT
SPONGE GAUZE 4X4 12PLY STER LF (GAUZE/BANDAGES/DRESSINGS) ×2 IMPLANT
SPONGE LAP 18X18 X RAY DECT (DISPOSABLE) ×4 IMPLANT
SPONGE LAP 4X18 X RAY DECT (DISPOSABLE) ×2 IMPLANT
SUCTION FRAZIER TIP 10 FR DISP (SUCTIONS) ×2 IMPLANT
SUT BONE WAX W31G (SUTURE) IMPLANT
SUT FIBERWIRE #2 38 T-5 BLUE (SUTURE) ×6
SUT MNCRL AB 3-0 PS2 18 (SUTURE) ×2 IMPLANT
SUT VIC AB 1 CT1 27 (SUTURE) ×1
SUT VIC AB 1 CT1 27XBRD ANBCTR (SUTURE) ×1 IMPLANT
SUT VIC AB 2-0 CT1 27 (SUTURE) ×1
SUT VIC AB 2-0 CT1 TAPERPNT 27 (SUTURE) ×1 IMPLANT
SUT VIC AB 2-0 SH 27 (SUTURE)
SUT VIC AB 2-0 SH 27X BRD (SUTURE) IMPLANT
SUTURE FIBERWR #2 38 T-5 BLUE (SUTURE) ×3 IMPLANT
SYR 30ML SLIP (SYRINGE) ×2 IMPLANT
SYR CONTROL 10ML LL (SYRINGE) IMPLANT
TAPE PAPER 3X10 WHT MICROPORE (GAUZE/BANDAGES/DRESSINGS) ×2 IMPLANT
TOWEL OR 17X24 6PK STRL BLUE (TOWEL DISPOSABLE) ×2 IMPLANT
TOWEL OR 17X26 10 PK STRL BLUE (TOWEL DISPOSABLE) ×2 IMPLANT
TOWER CARTRIDGE SMART MIX (DISPOSABLE) IMPLANT
TRAY FOLEY CATH 16FRSI W/METER (SET/KITS/TRAYS/PACK) IMPLANT
WATER STERILE IRR 1000ML POUR (IV SOLUTION) ×2 IMPLANT

## 2014-08-27 NOTE — Anesthesia Preprocedure Evaluation (Signed)
Anesthesia Evaluation  Patient identified by MRN, date of birth, ID band Patient awake    Reviewed: Allergy & Precautions, NPO status , Patient's Chart, lab work & pertinent test results  History of Anesthesia Complications (+) history of anesthetic complications  Airway Mallampati: III  TM Distance: <3 FB Neck ROM: Full    Dental  (+) Missing, Partial Upper   Pulmonary neg shortness of breath, neg sleep apnea, COPDneg recent URI, former smoker,  breath sounds clear to auscultation        Cardiovascular hypertension, Pt. on medications - angina- Past MI and - CHF Rhythm:Regular     Neuro/Psych  Headaches, PSYCHIATRIC DISORDERS Depression  Neuromuscular disease    GI/Hepatic Neg liver ROS, GERD-  ,  Endo/Other  negative endocrine ROS  Renal/GU Renal InsufficiencyRenal disease     Musculoskeletal  (+) Arthritis -,   Abdominal   Peds  Hematology negative hematology ROS (+)   Anesthesia Other Findings   Reproductive/Obstetrics                             Anesthesia Physical Anesthesia Plan  ASA: III  Anesthesia Plan: Regional and General   Post-op Pain Management:    Induction: Intravenous  Airway Management Planned: Oral ETT  Additional Equipment: None  Intra-op Plan:   Post-operative Plan: Extubation in OR  Informed Consent: I have reviewed the patients History and Physical, chart, labs and discussed the procedure including the risks, benefits and alternatives for the proposed anesthesia with the patient or authorized representative who has indicated his/her understanding and acceptance.   Dental advisory given  Plan Discussed with: CRNA and Surgeon  Anesthesia Plan Comments:         Anesthesia Quick Evaluation

## 2014-08-27 NOTE — Evaluation (Signed)
Physical Therapy Evaluation Patient Details Name: [name] MRN: [id number] DOB: October 05, 1930 Today's Date: 08/27/2014   History of Present Illness  Patient is a 81 y/o female s/p reverse L TSA. PMH includes HTN, neuropathy, COPD, depression.  Clinical Impression  Patient presents with post surgical deficits LUE and balance deficits impacting mobility. PTA, pt independent and lives home alone. Pt may benefit from use of SPC for balance during ambulation. Education provided on edema management and ROM exercises. Would benefit from ST SNF to maximize independence and mobility prior to return home.   Follow Up Recommendations SNF    Equipment Recommendations  Other (comment) (defer to next venue.)    Recommendations for Other Services       Precautions / Restrictions Precautions Precautions: Shoulder;Fall Shoulder Interventions: Shoulder sling/immobilizer;Off for dressing/bathing/exercises Precaution Booklet Issued: No Required Braces or Orthoses: Sling Restrictions Weight Bearing Restrictions: Yes LUE Weight Bearing: Non weight bearing      Mobility  Bed Mobility Overal bed mobility: Needs Assistance Bed Mobility: Sit to Supine       Sit to supine: Min assist   General bed mobility comments: Min A to return to supine.   Transfers Overall transfer level: Needs assistance Equipment used: None Transfers: Sit to/from Stand           General transfer comment: Mod A to boost from chair x1 with cues for hand placement, stood from toilet x1 using grab bar.  Ambulation/Gait Ambulation/Gait assistance: Min assist Ambulation Distance (Feet): 20 Feet (x2 bouts) Assistive device: None Gait Pattern/deviations: Step-to pattern;Decreased stride length;Decreased step length - right;Decreased step length - left   Gait velocity interpretation: Below normal speed for age/gender General Gait Details: Pt with slow, unsteady gait. Min A for balance.   Stairs             Wheelchair Mobility    Modified Rankin (Stroke Patients Only)       Balance Overall balance assessment: Needs assistance Sitting-balance support: Feet supported;Single extremity supported Sitting balance-Leahy Scale: Fair     Standing balance support: During functional activity Standing balance-Leahy Scale: Poor Standing balance comment: Min A for balance during static and dynamic tasks. Able to perform pericare with assist.                             Pertinent Vitals/Pain Pain Assessment: Faces Faces Pain Scale: Hurts a little bit Pain Location: LUE Pain Descriptors / Indicators: Sore Pain Intervention(s): Monitored during session;Repositioned    Home Living Family/patient expects to be discharged to:: Skilled nursing facility                      Prior Function Level of Independence: Independent         Comments: Pt lives alone, drives. Does painting class.     Hand Dominance   Dominant Hand: Right    Extremity/Trunk Assessment   Upper Extremity Assessment: Defer to OT evaluation;LUE deficits/detail       LUE Deficits / Details: Not able to mobilize digits/wrist yet.   Lower Extremity Assessment: Generalized weakness         Communication   Communication: No difficulties  Cognition Arousal/Alertness: Awake/alert Behavior During Therapy: WFL for tasks assessed/performed Overall Cognitive Status: Within Functional Limits for tasks assessed                      General Comments General comments (skin integrity, edema, etc.):  Instructed pt to perform AROM digits/wrist once able and instructed pt on positioning with pillows.     Exercises        Assessment/Plan    PT Assessment Patient needs continued PT services  PT Diagnosis Difficulty walking;Generalized weakness   PT Problem List Decreased strength;Decreased activity tolerance;Decreased balance;Decreased mobility  PT Treatment Interventions Balance  training;Gait training;DME instruction;Functional mobility training;Therapeutic activities;Therapeutic exercise;Patient/family education   PT Goals (Current goals can be found in the Care Plan section) Acute Rehab PT Goals Patient Stated Goal: to get back to my painting class PT Goal Formulation: With patient Time For Goal Achievement: 09/10/14 Potential to Achieve Goals: Good    Frequency Min 3X/week   Barriers to discharge Decreased caregiver support Pt lives alone    Co-evaluation               End of Session Equipment Utilized During Treatment: Gait belt;Other (comment) (sling) Activity Tolerance: Patient tolerated treatment well Patient left: in bed;with call bell/phone within reach;with bed alarm set Nurse Communication: Mobility status         Time: 1440-1510 PT Time Calculation (min) (ACUTE ONLY): 30 min   Charges:   PT Evaluation $Initial PT Evaluation Tier I: 1 Procedure PT Treatments $Gait Training: 8-22 mins   PT G Codes:        Lilly Gasser A Matilde Pottenger 08/27/2014, 3:19 PM  Wray Kearns, [place], DPT [phone]

## 2014-08-27 NOTE — Progress Notes (Signed)
Report to S Shaw RN as primary caregiver 

## 2014-08-27 NOTE — H&P (Signed)
[name]    Chief Complaint: left rotator cuff tear arthropathy HPI: The patient is a 81 y.o. female with end stage left shoulder rotator cuff tear arthropathy  Past Medical History  Diagnosis Date  . Hypertension   . Chronic diarrhea   . Arthritis   . Osteoporosis   . Goiter   . Neuropathy   . UTI (lower urinary tract infection)     start Tx today /w probiotic- 08/18/2014   . Headache(784.0)     sinus  . Cancer     melanoma  . Complication of anesthesia     hallucinations in the past, last surgery was OK anesth.   . Shortness of breath dyspnea   . COPD (chronic obstructive pulmonary disease)   . GERD (gastroesophageal reflux disease)   . Neuromuscular disorder     peripheral neuropathy - both feet   . Anemia   . History of blood transfusion 2006    due to colon bleeding   . 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    Past Surgical History  Procedure Laterality Date  . Abdominal hysterectomy    . Total knee arthroplasty Bilateral   . Bladder surgery    . Appendectomy    . Hip replacement-bilateral    . Esophagogastroduodenoscopy  04/19/2011    Procedure: ESOPHAGOGASTRODUODENOSCOPY (EGD);  Surgeon: Landry Dyke, MD;  Location: [facility];  Service: Endoscopy;  Laterality: N/A;  mac   . Balloon dilation  04/19/2011    Procedure: BALLOON DILATION;  Surgeon: Landry Dyke, MD;  Location: [facility];  Service: Endoscopy;  Laterality: N/A;  . Joint replacement    . Colon surgery  2006    Dr. Zella Richer- due to diverticulitis    Family History  Problem Relation Age of Onset  . Heart attack Mother   . Stroke Father     Social History:  reports that she quit smoking about 46 years ago. She does not have any smokeless tobacco history on file. She reports that she drinks alcohol. She reports that she does not use illicit drugs.  Allergies:  Allergies  Allergen Reactions  . Amoxicillin-Pot Clavulanate Diarrhea    diarrhea  .  Hydromorphone     Pt does not remember  . Lisinopril     fatigue  . Nitrofuran Derivatives Diarrhea  . Septra [Bactrim]     Pt does remember    Medications Prior to Admission  Medication Sig Dispense Refill  . acetaminophen (TYLENOL) 500 MG tablet Take 1,000 mg by mouth every 8 (eight) hours as needed for mild pain.     Marland Kitchen alum hydroxide-mag trisilicate (GAVISCON) 56-31 MG CHEW Chew 1 tablet by mouth daily as needed (reflux).    . calcium carbonate (OS-CAL) 600 MG TABS Take 600 mg by mouth daily.    . calcium carbonate (TUMS - DOSED IN MG ELEMENTAL CALCIUM) 500 MG chewable tablet Chew 1 tablet by mouth daily as needed for heartburn (reflux.).    Marland Kitchen diphenhydramine-acetaminophen (TYLENOL PM) 25-500 MG TABS Take 1 tablet by mouth at bedtime as needed (pain and sleep.).    Marland Kitchen diphenoxylate-atropine (LOMOTIL) 2.5-0.025 MG per tablet Take 4 tablets by mouth 3 (three) times daily before meals.     Marland Kitchen glycopyrrolate (ROBINUL) 1 MG tablet Take 1 mg by mouth 3 (three) times daily before meals.     Marland Kitchen ketotifen (THERA TEARS ALLERGY) 0.025 % ophthalmic solution Place 1 drop into both  eyes at bedtime.    Marland Kitchen loperamide (IMODIUM) 2 MG capsule Take 2-4 mg by mouth as needed for diarrhea or loose stools.    . Menthol, Topical Analgesic, (ZIMS MAX-FREEZE) 3.7 % GEL Apply 1 application topically daily as needed (pain).    . Menthol-Methyl Salicylate (SALONPAS) 1-75 % GEL Apply 1 application topically daily as needed (pain).    . Multiple Vitamins-Minerals (MULTIVITAMIN WITH MINERALS) tablet Take 1 tablet by mouth daily.    Marland Kitchen OVER THE COUNTER MEDICATION Apply 1 application topically at bedtime and may repeat dose one time if needed. "PhysAssist" Foot Pain cream.    . paregoric 2 MG/5ML solution Take 5 mLs by mouth 3 (three) times daily before meals.    . sodium chloride (OCEAN) 0.65 % SOLN nasal spray Place 1 spray into both nostrils as needed for congestion.    . valsartan (DIOVAN) 80 MG tablet Take 80 mg by mouth  at bedtime.     . famotidine-calcium carbonate-magnesium hydroxide (PEPCID COMPLETE) 10-800-165 MG CHEW Chew 1 tablet by mouth daily as needed (reflux.).    Marland Kitchen lidocaine (LIDODERM) 5 % Place 1 patch onto the skin as needed. Remove & Discard patch within 12 hours or as directed by MD       Physical Exam: left shoulder with painful and restricted motion as noted at recent office visits  Vitals  Temp:  [98.1 F (36.7 C)] 98.1 F (36.7 C) (06/30 0601) Pulse Rate:  [80] 80 (06/30 0601) Resp:  [20] 20 (06/30 0601) BP: (166)/(76) 166/76 mmHg (06/30 0601) SpO2:  [100 %] 100 % (06/30 0601) Weight:  [52.617 kg (116 lb)] 52.617 kg (116 lb) (06/30 0601)  Assessment/Plan  Impression: left rotator cuff tear arthropathy  Plan of Action: Procedure(s): REVERSE LEFT SHOULDER ARTHROPLASTY  Jaiveer Panas M 08/27/2014, 7:25 AM Contact # [phone]

## 2014-08-27 NOTE — Transfer of Care (Signed)
Immediate Anesthesia Transfer of Care Note  Patient: [name]  Procedure(s) Performed: Procedure(s): REVERSE LEFT SHOULDER ARTHROPLASTY (Left)  Patient Location: PACU  Anesthesia Type:General  Level of Consciousness: awake, patient cooperative and responds to stimulation  Airway & Oxygen Therapy: Patient Spontanous Breathing and Patient connected to face mask oxygen  Post-op Assessment: Report given to RN, Post -op Vital signs reviewed and stable and Patient moving all extremities X 4  Post vital signs: Reviewed and stable  Last Vitals:  Filed Vitals:   08/27/14 0601  BP: 166/76  Pulse: 80  Temp: 36.7 C  Resp: 20    Complications: No apparent anesthesia complications

## 2014-08-27 NOTE — Progress Notes (Signed)
Utilization review completed.  

## 2014-08-27 NOTE — Op Note (Signed)
08/27/2014  10:20 AM  PATIENT:   [name]  81 y.o. female  PRE-OPERATIVE DIAGNOSIS:  left shoulder rotator cuff tear arthropathy  POST-OPERATIVE DIAGNOSIS:  same  PROCEDURE:  L RSA #10-epi 1 cemented stem. +3 poly, 38 eccentric glenosphere  SURGEON:  Chakita Mcgraw, Metta Clines. M.D.  ASSISTANTS: Shuford pac   ANESTHESIA:   GET + ISB  EBL: 250  SPECIMEN:  none  Drains: none   PATIENT DISPOSITION:  PACU - hemodynamically stable.    PLAN OF CARE: Admit to inpatient   Dictation# [id number]   Contact # [phone]

## 2014-08-27 NOTE — Progress Notes (Signed)
Care of pt assumed by MA Kyrstal Monterrosa RN 

## 2014-08-27 NOTE — Anesthesia Procedure Notes (Addendum)
Procedure Name: Intubation Date/Time: 08/27/2014 7:53 AM Performed by: Gershon Mussel, VEDWATTIE Pre-anesthesia Checklist: Patient identified, Patient being monitored, Timeout performed, Emergency Drugs available and Suction available Patient Re-evaluated:Patient Re-evaluated prior to inductionOxygen Delivery Method: Circle System Utilized Preoxygenation: Pre-oxygenation with 100% oxygen Intubation Type: IV induction Ventilation: Mask ventilation without difficulty Laryngoscope Size: Miller and 2 Grade View: Grade III Tube type: Oral Tube size: 7.0 mm Number of attempts: 2 Airway Equipment and Method: Bougie stylet Placement Confirmation: ETT inserted through vocal cords under direct vision,  positive ETCO2 and breath sounds checked- equal and bilateral Secured at: 21 cm Tube secured with: Tape Dental Injury: Teeth and Oropharynx as per pre-operative assessment  Difficulty Due To: Difficult Airway- due to anterior larynx   Anesthesia Regional Block:  Interscalene brachial plexus block  Pre-Anesthetic Checklist: ,, timeout performed, Correct Patient, Correct Site, Correct Laterality, Correct Procedure, Correct Position, site marked, Risks and benefits discussed,  Surgical consent,  Pre-op evaluation,  At surgeon's request and post-op pain management  Laterality: Upper and Left  Prep: chloraprep       Needles:  Injection technique: Single-shot  Needle Type: Echogenic Stimulator Needle          Additional Needles:  Procedures: ultrasound guided (picture in chart) and nerve stimulator Interscalene brachial plexus block  Nerve Stimulator or Paresthesia:  Response: deltoid, 0.5 mA,   Additional Responses:   Narrative:  Injection made incrementally with aspirations every 5 mL.  Performed by: Personally  Anesthesiologist: Cacie Gaskins, CHRIS  Additional Notes: H+P and labs reviewed, risks and benefits discussed with patient, procedure tolerated well without complications

## 2014-08-28 ENCOUNTER — Encounter (HOSPITAL_COMMUNITY): Payer: Self-pay | Admitting: Orthopedic Surgery

## 2014-08-28 MED ORDER — OXYCODONE-ACETAMINOPHEN 5-325 MG PO TABS: 1.0000 | ORAL_TABLET | ORAL | Status: DC | PRN

## 2014-08-28 MED ORDER — DIAZEPAM 5 MG PO TABS: 2.5000 mg | ORAL_TABLET | Freq: Four times a day (QID) | ORAL | Status: DC | PRN

## 2014-08-28 NOTE — Clinical Social Work Placement (Signed)
   CLINICAL SOCIAL WORK PLACEMENT  NOTE  Date:  08/28/2014  Patient Details  Name: [name] MRN: [id number] Date of Birth: 1930/12/13  Clinical Social Work is seeking post-discharge placement for this patient at the [place] level of care (*CSW will initial, date and re-position this form in  chart as items are completed):  Yes   Patient/family provided with [facility] Work Department's list of facilities offering this level of care within the geographic area requested by the patient (or if unable, by the patient's family).  Yes   Patient/family informed of their freedom to choose among providers that offer the needed level of care, that participate in Medicare, Medicaid or managed care program needed by the patient, have an available bed and are willing to accept the patient.  Yes   Patient/family informed of [facility]'s ownership interest in [facility] and [facility], as well as of the fact that they are under no obligation to receive care at these facilities.  PASRR submitted to EDS on  (n/a)     PASRR number received on  (n/a)     Existing PASRR number confirmed on 08/28/14     FL2 transmitted to all facilities in geographic area requested by pt/family on 08/28/14     FL2 transmitted to all facilities within larger geographic area on  (n/a)     Patient informed that his/her managed care company has contracts with or will negotiate with certain facilities, including the following:   (yes, [place])     Yes   Patient/family informed of bed offers received.  Patient chooses bed at [facility]     Physician recommends and patient chooses bed at  (n/a)    Patient to be transferred to [facility] on 08/29/14.  Patient to be transferred to facility by [facility]     Patient family notified on   of transfer.  Name of family member notified:        PHYSICIAN       Additional Comment:     _______________________________________________ Caroline Sauger, LCSW 08/28/2014, 3:34 PM [phone]

## 2014-08-28 NOTE — Clinical Social Work Note (Signed)
Clinical Social Work Assessment  Patient Details  Name: [name] MRN: [id number] Date of Birth: [birth year]  Date of referral:  08/28/14               Reason for consult:  Facility Placement, Discharge Planning                Permission sought to share information with:  Chartered certified accountant granted to share information::  Yes, Verbal Permission Granted  Name::     n/a  Agency::  [facility]  Relationship::  n/a  Contact Information:  n/a  Housing/Transportation Living arrangements for the past 2 months:  Single Family Home Source of Information:  Patient Patient Interpreter Needed:  None Criminal Activity/Legal Involvement Pertinent to Current Situation/Hospitalization:  No - Comment as needed Significant Relationships:  Friend Lives with:  Self Do you feel safe going back to the place where you live?  No (High fall risk.) Need for family participation in patient care:  No (Coment)  Care giving concerns:  Patient expressed no concerns at this time.   Social Worker assessment / plan:  CSW received referral for possible SNF placement at time of discharge. CSW met with patient to discuss discharge plan. Per patient, patient will discharge to [facility] once medically stable. CSW confirmed discharge plan with [facility] admissions liaison. Patient to discharge to [facility] on 7/2.  Employment status:  Retired Science writer) PT Recommendations:  [place] / Referral to community resources:  [place]  Patient/Family's Response to care:  Patient understanding and agreeable to CSW plan of care.  Patient/Family's Understanding of and Emotional Response to Diagnosis, Current Treatment, and Prognosis:  Patient understanding and agreeable to CSW plan of care.  Emotional Assessment Appearance:  Appears stated age Attitude/Demeanor/Rapport:  Other (Pleasant) Affect (typically  observed):  Appropriate, Quiet, Pleasant Orientation:  Oriented to Self, Oriented to  Time, Oriented to Place, Oriented to Situation Alcohol / Substance use:  Not Applicable Psych involvement (Current and /or in the community):  No (Comment) (Not appropriate on this admission.)  Discharge Needs  Concerns to be addressed:  No discharge needs identified Readmission within the last 30 days:  No Current discharge risk:  None Barriers to Discharge:  No Barriers Identified   Rachael Sauger, LCSW 08/28/2014, 3:32 PM [phone]

## 2014-08-28 NOTE — Op Note (Signed)
[name], [name]                   ACCOUNT NO.:  [id number]  MEDICAL RECORD NO.:  [id number]  LOCATION:  5N26C                        FACILITY:  [place]  PHYSICIAN:  Metta Clines. Arabia Nylund, M.D.  DATE OF BIRTH:  [birth year]  DATE OF PROCEDURE:  08/27/2014 DATE OF DISCHARGE:                              OPERATIVE REPORT   PREOPERATIVE DIAGNOSIS:  End-stage left shoulder rotator cuff tear arthropathy.  POSTOPERATIVE DIAGNOSIS:  End-stage left shoulder rotator cuff tear arthropathy.  PROCEDURE:  Left reverse shoulder arthroplasty utilizing a cemented size 10/1 epi DePuy stem; a +3 polyethylene insert, and a 38 eccentric glenosphere.  SURGEON:  Metta Clines. Bee Hammerschmidt, M.D.  Terrence DupontOlivia Mackie A. Shuford, PA-C  ANESTHESIA:  General endotracheal as well as an interscalene block.  ESTIMATED BLOOD LOSS:  150 mL.  DRAINS:  None.  HISTORY:  [name] is an 81 year old female who has had chronic and progressive increasing left shoulder pain related to end-stage rotator cuff tear arthropathy.  She is brought to the operating room at this time for planned left reverse shoulder arthroplasty.  Preoperatively, I counseled [name] regarding treatment options as well as potential risks versus benefits thereof.  Possible complications were reviewed including bleeding, infection, neurovascular injury, persistent pain, loss of motion, anesthetic complication, failure of the implant, possible need for additional surgery.  She understands and accepts and agrees with our planned procedure.  DESCRIPTION OF PROCEDURE:  After undergoing routine preop evaluation, the patient received prophylactic antibiotics and an interscalene block was established in the holding area by the Anesthesia Department. Placed supine on the operating table, underwent smooth induction of a general endotracheal anesthesia.  Placed in beach-chair position and appropriate padded and protected.  Left shoulder girdle region was sterilely  prepped and draped in standard fashion.  Time-out was called. An anterior deltopectoral approach was made at the left shoulder through a 10 cm anterior incision.  Dissection carried down through the skin and subcu tissue identifying the deltopectoral interval which was developed from proximal to the most distal taking the cephalic vein laterally with the deltoid.  Of note, her anatomy was markedly abnormal due to the proximal and anterior migration of the humeral head and the severe underlying deformity.  I carefully divided adhesions, divided the upper centimeter of the pec major to enhance exposure.  We were able to divide adhesions beneath the deltoid and then mobilized the conjoined tendon and retracted this medially.  We then divided the remnant of the subscapularis away from its attachment on the inferior aspect of the lesser tuberosity and tagged the free margin with a pair of #2 FiberWire.  The superior and posterior rotator cuff were markedly absent.  We will deliver the humeral head through the wound at this point after dividing adhesions beneath the humeral head.  Changes consistent with severe superior migration and erosion against the undersurface of the acromion were noted.  At this time, I used a rongeur to open up the apex of the humeral head, I then performed hand reaming of the canal up to size 12.  We then made our humeral head resection at approximately 0 degrees retroversion utilizing an oscillating saw.  We  made a relatively deeper cut than  typically due to the severe proximal migration of the humerus.  Metal cap was placed over the cut surface of the humeral metaphysis.  We then exposed the glenoid.  There were severe acetabulization of the glenoid.  I released the capsule circumferentially and identified prominent peripheral osteophytes which were removed circumferentially and there was some erosion towards the base of the coracoid as well.  Once I removed all the  peripheral osteophytes, I then identified the appropriate starting point and placed the center of the glenosphere over the inferior margin of the glenoid. A center guide pin was placed.  We reamed the glenoid to a stable subchondral bony bed.  Used the peripheral reamer to remove any of peripheral bony impingement.  The central drill hole was placed and the glenoid base plate was then impacted.  The inferior, superior, and posterior screws were all 18 mm in length and the anterior was 42 with good bony purchase.  These were all tightened with good bony fixation and the anterior locking screw was then terminally locked.  We then placed the 38 eccentric glenosphere over the base plate and at this point, we tightened impacted and retightened with good fixation.  At this point, we then returned our attention to the humerus where it became clear that the dimensions of the humeral shaft did not appropriately accommodate the size 12 stem proximally, and so we downsized to a size 10 and then and reamed the metaphyseal region of the proximal humerus.  Due to overall bony contours and alignment, we felt as though there was not adequate bone stock in the metaphysis to allow to acceptually use our press-fit construct,  so we opted to use a cemented construct and we did get successful alignment and soft tissue balance with a size 10 stem.  At this point, we elected to use the size 10 at the 1 implant which had AJ poor coating proximally, and so we use cement distally around the stem.  we mixed cement in the bowl, hand packed the canal, and placed around the distal portion of the humeral stem and seated this down to the appropriate level with care taken to avoid any cement proximally in the metaphyseal region.  Cement was seated into the appropriate depth.  Cement was hardened and the excess cement was meticulously removed.  This point we then performed final reductions and the +3 poly showed excellent  soft tissue balance good mobility of the shoulder.  The final +3 poly was then impacted into the humeral stem, final reduction was performed.  Again the overall shoulder mobility and stability was much to our satisfaction.  The joint was then copiously irrigated and hemostasis was obtained.  The deltopectoral interval was closed with a series of interrupted figure-of-eight #1 Vicryl sutures,  2-0 Vicryl used for subcu layer and intracuticular 3-0 Monocryl for the skin followed by Dermabond, and an Aquacel dressing. Left arm was placed into a sling.  The patient was then awakened, extubated, taken recovery room in stable condition.  Jenetta Loges, PA-C, was used as an Environmental consultant throughout this case, essential for help with positioning the patient, positioning the extremity, management of the retractors, implantation of the prosthesis, wound closure, and intraoperative decision making.     Metta Clines. Deandre Brannan, M.D.     KMS/MEDQ  D:  08/27/2014  T:  08/28/2014  Job:  [id number]

## 2014-08-28 NOTE — Anesthesia Postprocedure Evaluation (Signed)
  Anesthesia Post-op Note  Patient: [name]  Procedure(s) Performed: Procedure(s): REVERSE LEFT SHOULDER ARTHROPLASTY (Left)  Patient Location: PACU  Anesthesia Type:General and Regional  Level of Consciousness: awake  Airway and Oxygen Therapy: Patient Spontanous Breathing  Post-op Pain: none  Post-op Assessment: Post-op Vital signs reviewed, Patient's Cardiovascular Status Stable, Respiratory Function Stable, Patent Airway, No signs of Nausea or vomiting and Pain level controlled              Post-op Vital Signs: Reviewed and stable  Last Vitals:  Filed Vitals:   08/28/14 0549  BP: 154/75  Pulse: 85  Temp: 36.9 C  Resp: 16    Complications: No apparent anesthesia complications, baseline HTN post op

## 2014-08-28 NOTE — Progress Notes (Signed)
[name]  MRN: [id number] DOB/Age: [birth year] 81 y.o. Physician: Rada Hay Procedure: Procedure(s) (LRB): REVERSE LEFT SHOULDER ARTHROPLASTY (Left)     Subjective: Had a lot of pain overnight. Sitting up eating lunch at moment  Vital Signs Temp:  [97.8 F (36.6 C)-100.4 F (38 C)] 98.5 F (36.9 C) (07/01 0549) Pulse Rate:  [73-91] 85 (07/01 0549) Resp:  [14-16] 16 (07/01 0549) BP: (141-156)/(70-82) 154/75 mmHg (07/01 0549) SpO2:  [93 %-100 %] 96 % (07/01 0549)  Lab Results No results for input(s): WBC, HGB, HCT, PLT in the last 72 hours. BMET No results for input(s): NA, K, CL, CO2, GLUCOSE, BUN, CREATININE, CALCIUM in the last 72 hours. INR  Date Value Ref Range Status  08/18/2014 1.02 0.00 - 1.49 Final     Exam Left shoulder dressing dry NVI        Plan DC tomorrow to [place], rx etc and DC summary already done  [facility] for Dr.Kevin Supple 08/28/2014, 1:31 PM Contact # [phone]

## 2014-08-28 NOTE — Progress Notes (Signed)
Occupational Therapy Evaluation Patient Details Name: [name] MRN: [id number] DOB: May 14, 1930 Today's Date: 08/28/2014    History of Present Illness Patient is a 81 y/o female s/p reverse L TSA. PMH includes HTN, neuropathy, COPD, depression.   Clinical Impression   PTA,pt lived alone and was mod I with ADL and mobility. Began ROM and ADL retraining per protocol/orders. Pt will need to D?C to SNF for rehab to facilitate safe D/C home. Will follow acutely to address established goals.     Follow Up Recommendations  SNF;Supervision/Assistance - 24 hour    Equipment Recommendations  Other (comment) (TBA at SNF)    Recommendations for Other Services       Precautions / Restrictions Precautions Precautions: Shoulder;Fall Type of Shoulder Precautions: see orders- A/PROM FF 0-90; A/PROM Abd 0-60; able to use LUE for IR/ER for ADL only , no resisteive IR/ER per Olivia Mackie; AROM elbow flex/ext/sup/pron; AROM hand. no pushing pulling lifting Shoulder Interventions: Shoulder sling/immobilizer;Off for dressing/bathing/exercises (may be removed in sitting when movement is controlled) Precaution Booklet Issued: Yes (comment) Required Braces or Orthoses: Sling Restrictions Weight Bearing Restrictions: Yes LUE Weight Bearing: Non weight bearing      Mobility Bed Mobility Overal bed mobility: Needs Assistance Bed Mobility: Supine to Sit     Supine to sit: Min assist        Transfers Overall transfer level: Needs assistance   Transfers: Sit to/from Stand Sit to Stand: Min assist              Balance Overall balance assessment: Needs assistance   Sitting balance-Leahy Scale: Fair       Standing balance-Leahy Scale: Poor                              ADL Overall ADL's : Needs assistance/impaired Eating/Feeding: Set up   Grooming: Moderate assistance   Upper Body Bathing: Maximal assistance   Lower Body Bathing: Moderate assistance;Sit to/from stand    Upper Body Dressing : Maximal assistance;Sitting Upper Body Dressing Details (indicate cue type and reason): began educating pt on UB dressing and donning sling Lower Body Dressing: Moderate assistance;Sit to/from stand   Toilet Transfer: Minimal assistance;Ambulation   Toileting- Clothing Manipulation and Hygiene: Moderate assistance;Sit to/from stand       Functional mobility during ADLs: Minimal assistance General ADL Comments: Educated pt on shoulder precautions and protocol. Written handouts given and reviewed.      Vision  wears glasses   Perception     Praxis      Pertinent Vitals/Pain Pain Assessment: 0-10 Pain Score: 5  Pain Location: LUE Pain Descriptors / Indicators: Aching;Discomfort Pain Intervention(s): Limited activity within patient's tolerance;Monitored during session;Repositioned;Ice applied     Hand Dominance Right   Extremity/Trunk Assessment Upper Extremity Assessment Upper Extremity Assessment: RUE deficits/detail;LUE deficits/detail RUE Deficits / Details: apparent R RTC insufficiency; @ 20-30 degrees active shoulder movement. c/o pain. elbow/wrist hand ROM WFL for ADL. weak grip strength at baseline RUE Coordination: decreased gross motor LUE Deficits / Details: limited due to apin. elbow wrist [facility] for ADL. Pain with full elbow extension. See shoulder ROM limitations above in precautions LUE Coordination: decreased fine motor;decreased gross motor   Lower Extremity Assessment Lower Extremity Assessment: Generalized weakness   Cervical / Trunk Assessment Cervical / Trunk Assessment: Kyphotic   Communication     Cognition Arousal/Alertness: Awake/alert Behavior During Therapy: WFL for tasks assessed/performed Overall Cognitive Status: Within Functional Limits for  tasks assessed                     General Comments       Exercises Exercises: Shoulder     Shoulder Instructions Shoulder Instructions Donning/doffing shirt  without moving shoulder: Maximal assistance Method for sponge bathing under operated UE: Maximal assistance Donning/doffing sling/immobilizer: Maximal assistance Correct positioning of sling/immobilizer: Maximal assistance ROM for elbow, wrist and digits of operated UE: Moderate assistance Sling wearing schedule (on at all times/off for ADL's): Maximal assistance Proper positioning of operated UE when showering: Maximal assistance Positioning of UE while sleeping: Minimal assistance    Home Living Family/patient expects to be discharged to:: Skilled nursing facility Living Arrangements: Alone                                      Prior Functioning/Environment Level of Independence: Independent        Comments: Pt lives alone.States IADL tasks were very difficult for her PTA    OT Diagnosis: Generalized weakness;Acute pain   OT Problem List: Decreased strength;Decreased range of motion;Decreased activity tolerance;Impaired balance (sitting and/or standing);Decreased knowledge of use of DME or AE;Decreased knowledge of precautions;Impaired UE functional use;Pain   OT Treatment/Interventions: Self-care/ADL training;Therapeutic exercise;DME and/or AE instruction;Therapeutic activities;Patient/family education;Balance training    OT Goals(Current goals can be found in the care plan section) Acute Rehab OT Goals Patient Stated Goal: to be able to use my arm OT Goal Formulation: With patient Time For Goal Achievement: 09/11/14 Potential to Achieve Goals: Good  OT Frequency: Min 3X/week   Barriers to D/C: Decreased caregiver support          Co-evaluation              End of Session Nurse Communication: Mobility status;Precautions;Weight bearing status  Activity Tolerance: Patient tolerated treatment well Patient left: in chair;with call bell/phone within reach   Time: 1015-1051 OT Time Calculation (min): 36 min Charges:  OT General Charges $OT Visit: 1  Procedure OT Evaluation $Initial OT Evaluation Tier I: 1 Procedure OT Treatments $Self Care/Home Management : 8-22 mins G-Codes:    Farhan Jean,HILLARY 08/30/14, 12:06 PM   Maurie Boettcher, OTR/L  [phone] 08-30-14

## 2014-08-28 NOTE — Discharge Summary (Signed)
PATIENT ID:      [name]  MRN:     [id number] DOB/AGE:    06-20-1930 / 81 y.o.     DISCHARGE SUMMARY  ADMISSION DATE:    08/27/2014 DISCHARGE DATE:    ADMISSION DIAGNOSIS: left rotator cuff tear arthropathy Past Medical History  Diagnosis Date  . Hypertension   . Chronic diarrhea   . Arthritis   . Osteoporosis   . Goiter   . Neuropathy   . UTI (lower urinary tract infection)     start Tx today /w probiotic- 08/18/2014   . Headache(784.0)     sinus  . Cancer     melanoma  . Complication of anesthesia     hallucinations in the past, last surgery was OK anesth.   . Shortness of breath dyspnea   . COPD (chronic obstructive pulmonary disease)   . GERD (gastroesophageal reflux disease)   . Neuromuscular disorder     peripheral neuropathy - both feet   . Anemia   . History of blood transfusion 2006    due to colon bleeding   . 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    DISCHARGE DIAGNOSIS:   Active Problems:   S/p reverse total shoulder arthroplasty   PROCEDURE: Procedure(s): REVERSE LEFT SHOULDER ARTHROPLASTY on 08/27/2014  CONSULTS:     HISTORY:  See H&P in chart.  HOSPITAL COURSE:  [name] is a 81 y.o. admitted on 08/27/2014 with a chief complaint of severe bilateral shoulder pain left greater than right, and found to have a diagnosis of left rotator cuff tear arthropathy.  They were brought to the operating room on 08/27/2014 and underwent Procedure(s): REVERSE LEFT SHOULDER ARTHROPLASTY.    They were given perioperative antibiotics: Anti-infectives    Start     Dose/Rate Route Frequency Ordered Stop   08/27/14 1400  ceFAZolin (ANCEF) IVPB 2 g/50 mL premix     2 g 100 mL/hr over 30 Minutes Intravenous Every 6 hours 08/27/14 1338 08/28/14 0249   08/27/14 0630  ceFAZolin (ANCEF) IVPB 2 g/50 mL premix     2 g 100 mL/hr over 30 Minutes Intravenous To [facility] 08/26/14 1240 08/27/14 0755    .  Patient underwent the above named  procedure and tolerated it well. The following day they were hemodynamically stable and pain was controlled on oral analgesics. They were neurovascularly intact to the operative extremity. PT/OT was ordered and worked with patient per protocol. They were medically and orthopaedically stable for discharge on day 2. Due to patients age and support system a bed in skilled at [facility] had been arranged for her recovery.   DIAGNOSTIC STUDIES:  RECENT RADIOGRAPHIC STUDIES :  Mm Digital Screening Bilateral  08/14/2014   CLINICAL DATA:  Screening.  EXAM: DIGITAL SCREENING BILATERAL MAMMOGRAM WITH CAD  COMPARISON:  Previous exam(s).  ACR Breast Density Category c: The breast tissue is heterogeneously dense, which may obscure small masses.  FINDINGS: There are no findings suspicious for malignancy. Images were processed with CAD.  IMPRESSION: No mammographic evidence of malignancy. A result letter of this screening mammogram will be mailed directly to the patient.  RECOMMENDATION: Screening mammogram in one year. (Code:SM-B-01Y)  BI-RADS CATEGORY  1: Negative.   Electronically Signed   By: Lajean Manes M.D.   On: 08/14/2014 15:57    RECENT VITAL SIGNS:  Patient Vitals for the past 24 hrs:  BP Temp Temp src Pulse Resp  SpO2  08/28/14 0549 (!) 154/75 mmHg 98.5 F (36.9 C) Oral 85 16 96 %  08/28/14 0032 (!) 141/82 mmHg 99 F (37.2 C) Oral 83 14 96 %  08/27/14 2023 (!) 143/73 mmHg (!) 100.4 F (38 C) Oral 91 16 100 %  08/27/14 1600 (!) 156/70 mmHg 97.8 F (36.6 C) Oral 73 14 93 %  .  RECENT EKG RESULTS:    Orders placed or performed during the hospital encounter of 08/18/14  . EKG 12 lead  . EKG 12 lead    DISCHARGE INSTRUCTIONS:    DISCHARGE MEDICATIONS:     Medication List    TAKE these medications        acetaminophen 500 MG tablet  Commonly known as:  TYLENOL  Take 1,000 mg by mouth every 8 (eight) hours as needed for mild pain.     alum hydroxide-mag trisilicate 48-54 MG Chew chewable  tablet  Commonly known as:  GAVISCON  Chew 1 tablet by mouth daily as needed (reflux).     calcium carbonate 500 MG chewable tablet  Commonly known as:  TUMS - dosed in mg elemental calcium  Chew 1 tablet by mouth daily as needed for heartburn (reflux.).     calcium carbonate 600 MG Tabs tablet  Commonly known as:  OS-CAL  Take 600 mg by mouth daily.     diazepam 5 MG tablet  Commonly known as:  VALIUM  Take 0.5-1 tablets (2.5-5 mg total) by mouth every 6 (six) hours as needed for muscle spasms or sedation.     diphenhydramine-acetaminophen 25-500 MG Tabs  Commonly known as:  TYLENOL PM  Take 1 tablet by mouth at bedtime as needed (pain and sleep.).     diphenoxylate-atropine 2.5-0.025 MG per tablet  Commonly known as:  LOMOTIL  Take 4 tablets by mouth 3 (three) times daily before meals.     famotidine-calcium carbonate-magnesium hydroxide 10-800-165 MG Chew chewable tablet  Commonly known as:  PEPCID COMPLETE  Chew 1 tablet by mouth daily as needed (reflux.).     glycopyrrolate 1 MG tablet  Commonly known as:  ROBINUL  Take 1 mg by mouth 3 (three) times daily before meals.     lidocaine 5 %  Commonly known as:  LIDODERM  Place 1 patch onto the skin as needed. Remove & Discard patch within 12 hours or as directed by MD     loperamide 2 MG capsule  Commonly known as:  IMODIUM  Take 2-4 mg by mouth as needed for diarrhea or loose stools.     multivitamin with minerals tablet  Take 1 tablet by mouth daily.     OVER THE COUNTER MEDICATION  Apply 1 application topically at bedtime and may repeat dose one time if needed. "PhysAssist" Foot Pain cream.     oxyCODONE-acetaminophen 5-325 MG per tablet  Commonly known as:  PERCOCET  Take 1-2 tablets by mouth every 4 (four) hours as needed.     paregoric 2 MG/5ML solution  Take 5 mLs by mouth 3 (three) times daily before meals.     SALONPAS 7-15 % Gel  Apply 1 application topically daily as needed (pain).     sodium  chloride 0.65 % Soln nasal spray  Commonly known as:  OCEAN  Place 1 spray into both nostrils as needed for congestion.     THERA TEARS ALLERGY 0.025 % ophthalmic solution  Generic drug:  ketotifen  Place 1 drop into both eyes at bedtime.  valsartan 80 MG tablet  Commonly known as:  DIOVAN  Take 80 mg by mouth at bedtime.     ZIMS MAX-FREEZE 3.7 % Gel  Generic drug:  Menthol (Topical Analgesic)  Apply 1 application topically daily as needed (pain).        OT Protocol for Reverse Total Shoulder    FOLLOW UP VISIT:       Follow-up Information    Follow up with Marin Shutter, MD.   Specialty:  Orthopedic Surgery   Why:  call to be seen in 5-7 days   Contact information:   [address] [place] [address] [place] [address] [phone]      patient needs to be seen in 5-7 days, please arrange transport and call for appt  DISCHARGE TO: [facility]  DISPOSITION: Good  DISCHARGE CONDITION:  Festus Barren for Dr. Justice Britain 08/28/2014, 1:27 PM

## 2014-08-28 NOTE — Discharge Instructions (Signed)
° °  Metta Clines. Supple, M.D., F.A.A.O.S. Orthopaedic Surgery Specializing in Arthroscopic and Reconstructive Surgery of the Shoulder and Knee [phone] [address] [place], [place] [address] - Fax [phone]   POST-OP REVERSE SHOULDER REPLACEMENT  INSTRUCTIONS  1. Call the office at [phone] to schedule your first post-op appointment 5-7 days from the date of your surgery.  2. The bandage over your incision is waterproof. You may begin showering with this dressing on. You may leave this dressing on until first follow up appointment within 2 weeks. If you would like to remove it you may do so after the 5th day. Go slow and tug at the borders gently to break the bond the dressing has with the skin. The steri strips may come off with the dressing. At this point if there is no drainage it is okay to go without a bandage or you may cover it with a light guaze and tape. Leave the steri-strips in place over your incision. You can expect drainage that is bloody or yellow in nature that should gradually decrease from day of surgery. Change your dressing daily until drainage is completely resolved, then you may feel free to go without a bandage. You can also expect significant bruising around your shoulder that will drift down your arm and into your chest wall. This is very normal and should resolve over several days.   3. Wear your sling/immobilizer at all times except to perform the exercises below or to occasionally let your arm dangle by your side to stretch your elbow. You also need to sleep in your sling immobilizer until instructed otherwise.  4. Range of motion to your elbow, wrist, and hand are encouraged 3-5 times daily. Exercise to your hand and fingers helps to reduce swelling you may experience.  5. Utilize ice to the shoulder 3-5 times minimum a day and additionally if you are experiencing pain.  6. Prescriptions for a pain medication and a muscle relaxant are provided for  you. It is recommended that if you are experiencing pain that you pain medication alone is not controlling, add the muscle relaxant along with the pain medication which can give additional pain relief. The first 1-2 days is generally the most severe of your pain and then should gradually decrease. As your pain lessens it is recommended that you decrease your use of the pain medications to an "as needed basis'" only and to always comply with the recommended dosages of the pain medications.  7. Pain medications can produce constipation along with their use. If you experience this, the use of an over the counter stool softener or laxative daily is recommended.   8. For most patients, if insurance allows, home health services to include therapy has been arranged.  9. For additional questions or concerns, please do not hesitate to call the office. If after hours there is an answering service to forward your concerns to the physician on call.  POST-OP EXERCISES  Pendulum Exercises  Perform pendulum exercises while standing and bending at the waist. Support your uninvolved arm on a table or chair and allow your operated arm to hang freely. Make sure to do these exercises passively - not using you shoulder muscles.  Repeat 20 times. Do 3 sessions per day.    I have had our office fax our protocol to your facility at [facility]

## 2014-08-29 NOTE — Progress Notes (Signed)
Patient ID: [name], female   DOB: December 08, 1930, 81 y.o.   MRN: [id number] Subjective: 2 Days Post-Op Procedure(s) (LRB): REVERSE LEFT SHOULDER ARTHROPLASTY (Left)    Patient reports pain as moderate.  Still hurting quite a bit.  "May hold off getting other shoulder replaced if this painful".  Up eating breakfast this am however  Objective:   VITALS:   Filed Vitals:   08/29/14 0516  BP: 134/77  Pulse: 93  Temp: 98.8 F (37.1 C)  Resp: 17    Neurovascular intact Incision: dressing C/D/I  LABS No results for input(s): HGB, HCT, WBC, PLT in the last 72 hours.  No results for input(s): NA, K, BUN, CREATININE, GLUCOSE in the last 72 hours.  No results for input(s): LABPT, INR in the last 72 hours.   Assessment/Plan: 2 Days Post-Op Procedure(s) (LRB): REVERSE LEFT SHOULDER ARTHROPLASTY (Left)   Discharge to SNF today RTC to see Supple in a week Pain Rx on chart

## 2014-08-29 NOTE — Progress Notes (Signed)
CSW contact nurse and facility for d/c planning.    CSW will give packet to nurse for discharge. Family friend will be transporting Pt and nurse will give Pt for transporting.    CSW provided contact number to facility and nurse for any additional information or assistance needed.    [facility]  2S, 56M,3S, 5N, 6N [phone]

## 2014-08-29 NOTE — Progress Notes (Signed)
Occupational Therapy Treatment Patient Details Name: [name] MRN: [id number] DOB: 05-30-1930 Today's Date: 08/29/2014    History of present illness Patient is an 81 y.o. female s/p reverse L TSA. PMH includes HTN, neuropathy, COPD, depression.   OT comments  Performed UE exercises in supine. Pt planning to d/c to SNF today.  Follow Up Recommendations  SNF;Supervision/Assistance - 24 hour    Equipment Recommendations  Other (comment) (TBD at SNF)    Recommendations for Other Services      Precautions / Restrictions Precautions Precautions: Shoulder;Fall Type of Shoulder Precautions: see orders- A/PROM FF 0-90; A/PROM Abd 0-60; able to use LUE for IR/ER for ADL only , no resistive IR/ER per Olivia Mackie; AROM elbow flexion/extension/supination/pronation; AROM hand. no pushing pulling lifting Shoulder Interventions: Shoulder sling/immobilizer;Off for dressing/bathing/exercises (if sitting in controlled environment, okay to come out) Precaution Booklet Issued:  (one in room) Precaution Comments: reviewed precautions Required Braces or Orthoses: Sling Restrictions Weight Bearing Restrictions: Yes LUE Weight Bearing: Non weight bearing       Mobility      Transfers                 General transfer comment: not assessed        ADL Overall ADL's : Needs assistance/impaired                                       General ADL Comments: see shoulder section      Vision                     Perception     Praxis      Cognition  Awake/Alert Behavior During Therapy: WFL for tasks assessed/performed Overall Cognitive Status: Within Functional Limits for tasks assessed                       Extremity/Trunk Assessment               Exercises Other Exercises Other Exercises: All exercises performed in supine position. Pt performed AROM left wrist flexion/extension (OT supported arm), AROM digit composite flexion/extension, AAROM  elbow flexion/extension Other Exercises: Pt performed left forearm supination/pronation, shoulder AAROM FF (approximately 15 reps), and AAROM shoulder abduction (approximately 15 reps)    Shoulder Instructions Shoulder Instructions Donning/doffing shirt without moving shoulder:  (educated on technique) Donning/doffing sling/immobilizer:  (educated and OT performed) Correct positioning of sling/immobilizer:  (educated and OT demonstrated) ROM for elbow, wrist and digits of operated UE: Moderate assistance Positioning of UE while sleeping:  (adjusted positioning of pillow)     General Comments      Pertinent Vitals/ Pain       Pain Assessment: 0-10 Pain Score: 4  Pain Location: left shoulder Pain Descriptors / Indicators: Aching;Heaviness Pain Intervention(s): Monitored during session;Repositioned;Limited activity within patient's tolerance  Home Living                                          Prior Functioning/Environment              Frequency Min 3X/week     Progress Toward Goals  OT Goals(current goals can now be found in the care plan section)  Progress towards OT goals: Progressing toward goals  Acute Rehab OT Goals Patient Stated  Goal: not stated OT Goal Formulation: With patient Time For Goal Achievement: 09/11/14 Potential to Achieve Goals: Good ADL Goals Pt Will Perform Upper Body Bathing: with min assist;sitting Pt/caregiver will Perform Home Exercise Program: Left upper extremity;With written HEP provided;With minimal assist (FF 0-90; Abduction 0-60 A/PROM) Additional ADL Goal #1: Pt will verbalize understanding of protocol, including allowable use LUE and ROM of FF 0-90; abduction 0-60 Additional ADL Goal #2: Pt will donn/doff sling with min A  Plan Discharge plan remains appropriate    Co-evaluation                 End of Session Equipment Utilized During Treatment: Other (comment) (sling)   Activity Tolerance Patient  tolerated treatment well   Patient Left in bed;with call bell/phone within reach   Nurse Communication          Time: 1110-1130 OT Time Calculation (min): 20 min  Charges: OT General Charges $OT Visit: 1 Procedure OT Treatments $Therapeutic Exercise: 8-22 mins  Benito Mccreedy OTR/L [phone] 08/29/2014, 12:11 PM

## 2014-08-29 NOTE — Progress Notes (Signed)
Report given to [place] at  [facility] at 1433. Patient discharged to [facility]. Left unit at 1513 via wheelchair; accompanied by NT and a friend (Jan) of patient. Patient in stable condition at time of discharge. Vitals within norm. Patient denied pain/discomfort. Oxy IR 10 mg, however, given to patient per her request. "My shoulder hurts when I move it."  Discharge pocket given to friend (Jan). Writer instructed Jan to hand packet over to [place] at [facility].

## 2014-09-01 ENCOUNTER — Encounter: Payer: Self-pay | Admitting: Adult Health

## 2014-09-01 ENCOUNTER — Non-Acute Institutional Stay (SKILLED_NURSING_FACILITY): Payer: Medicare Other | Admitting: Adult Health

## 2014-09-01 DIAGNOSIS — K529 Noninfective gastroenteritis and colitis, unspecified: Secondary | ICD-10-CM

## 2014-09-01 DIAGNOSIS — I10 Essential (primary) hypertension: Secondary | ICD-10-CM | POA: Diagnosis not present

## 2014-09-01 DIAGNOSIS — Z96612 Presence of left artificial shoulder joint: Secondary | ICD-10-CM | POA: Diagnosis not present

## 2014-09-01 DIAGNOSIS — M12812 Other specific arthropathies, not elsewhere classified, left shoulder: Secondary | ICD-10-CM

## 2014-09-01 DIAGNOSIS — G629 Polyneuropathy, unspecified: Secondary | ICD-10-CM

## 2014-09-01 DIAGNOSIS — M75102 Unspecified rotator cuff tear or rupture of left shoulder, not specified as traumatic: Principal | ICD-10-CM

## 2014-09-01 DIAGNOSIS — K219 Gastro-esophageal reflux disease without esophagitis: Secondary | ICD-10-CM | POA: Diagnosis not present

## 2014-09-01 DIAGNOSIS — M12512 Traumatic arthropathy, left shoulder: Secondary | ICD-10-CM | POA: Diagnosis not present

## 2014-09-01 NOTE — Progress Notes (Signed)
Patient ID: [name], female   DOB: 20-Aug-1930, 81 y.o.   MRN: [id number]   09/01/2014  Facility:  Nursing Home Location:  [place] Room Number: 103-P LEVEL OF CARE:  SNF (31)   Chief Complaint  Patient presents with  . Hospitalization Follow-up    Left rotator cuff tear arthropathy S/P reverse left shoulder arthroplasty, hypertension, chronic diarrhea, GERD and neuropathy    HISTORY OF PRESENT ILLNESS:  This is an 81 year old female who has been admitted to [facility] on 08/29/14 from [facility]. She has PMH of hypertension, chronic diarrhea, arthritis, osteoporosis, goiter, neuropathy, melanoma, COPD, GERD, peripheral neuropathy, anemia and depression. She has left rotator cuff tear arthropathy and had reverse left shoulder arthroplasty on 08/27/14.  She complains of moderate pain on left shoulder.  She has been admitted for a short-term rehabilitation.  PAST MEDICAL HISTORY:  Past Medical History  Diagnosis Date  . Hypertension   . Chronic diarrhea   . Arthritis   . Osteoporosis   . Goiter   . Neuropathy   . UTI (lower urinary tract infection)     start Tx today /w probiotic- 08/18/2014   . Headache(784.0)     sinus  . Cancer     melanoma  . Complication of anesthesia     hallucinations in the past, last surgery was OK anesth.   . Shortness of breath dyspnea   . COPD (chronic obstructive pulmonary disease)   . GERD (gastroesophageal reflux disease)   . Neuromuscular disorder     peripheral neuropathy - both feet   . Anemia   . History of blood transfusion 2006    due to colon bleeding   . 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    CURRENT MEDICATIONS: Reviewed per MAR/see medication list  Allergies  Allergen Reactions  . Amoxicillin-Pot Clavulanate Diarrhea    diarrhea  . Hydromorphone     Pt does not remember  . Lisinopril     fatigue  . Nitrofuran Derivatives Diarrhea  . Septra  [Bactrim]     Pt does remember     REVIEW OF SYSTEMS:  GENERAL: no change in appetite, no fatigue, no weight changes, no fever, chills or weakness RESPIRATORY: no cough, SOB, DOE, wheezing, hemoptysis CARDIAC: no chest pain, edema or palpitations GI: no abdominal pain, diarrhea, constipation, heart burn, nausea or vomiting  PHYSICAL EXAMINATION  GENERAL: no acute distress, normal body habitus SKIN:  Left shoulder surgical site is covered with aquacel dressing, no erythema, dry EYES: conjunctivae normal, sclerae normal, normal eye lids NECK: supple, trachea midline, no neck masses, no thyroid tenderness, no thyromegaly LYMPHATICS: no LAN in the neck, no supraclavicular LAN RESPIRATORY: breathing is even & unlabored, BS CTAB CARDIAC: RRR, no murmur,no extra heart sounds, LUE edema 2+ GI: abdomen soft, normal BS, no masses, no tenderness, no hepatomegaly, no splenomegaly EXTREMITIES: able to move RUE and BLE; limited movement of LUE due to surgery; LUE on sling PSYCHIATRIC: the patient is alert & oriented to person, affect & behavior appropriate  LABS/RADIOLOGY: Labs reviewed: Basic Metabolic Panel:  Recent Labs  08/18/14 1554  NA 139  K 4.2  CL 99*  CO2 30  GLUCOSE 107*  BUN 34*  CREATININE 1.23*  CALCIUM 9.7   Liver Function Tests:  Recent Labs  08/18/14 1554  AST 22  ALT 14  ALKPHOS 76  BILITOT 0.6  PROT 6.8  ALBUMIN 3.8    CBC:  Recent Labs  08/18/14 1554  WBC 5.7  NEUTROABS 3.9  HGB 12.9  HCT 39.0  MCV 95.4  PLT 222    Mm Digital Screening Bilateral  08/14/2014   CLINICAL DATA:  Screening.  EXAM: DIGITAL SCREENING BILATERAL MAMMOGRAM WITH CAD  COMPARISON:  Previous exam(s).  ACR Breast Density Category c: The breast tissue is heterogeneously dense, which may obscure small masses.  FINDINGS: There are no findings suspicious for malignancy. Images were processed with CAD.  IMPRESSION: No mammographic evidence of malignancy. A result letter of this  screening mammogram will be mailed directly to the patient.  RECOMMENDATION: Screening mammogram in one year. (Code:SM-B-01Y)  BI-RADS CATEGORY  1: Negative.   Electronically Signed   By: Lajean Manes M.D.   On: 08/14/2014 15:57    ASSESSMENT/PLAN:  Left rotator cuff tear arthropathy S/P reverse left shoulder arthroplasty - for rehabilitation; follow-up with Dr. Justice Britain, orthopedic surgeon, in 5-7 days; continue Percocet 5/325 mg 1-2 tabs by mouth every 4 hours when necessary and start oxycodone 5 mg take 1/2 tab = 0.5 mg by mouth every 6 a.m., 2 PM and 10 PM for pain; Valium 5 mg 1/2 tab- 1 tab by mouth every 6 hours when necessary for muscle spasm  Hypertension - well controlled; continue Diovan 80 mg 1 tab by mouth daily at bedtime  Chronic diarrhea - continue Lomotil to 2.5-0.025 mg take 4 tabs by mouth 3 times a day before meals and paregoric 2 mg/5 mL by mouth 3 times a day before meals  GERD - continue Pepcid complete 10-8 100-165 mg 1 tab by mouth daily when necessary and Robinul 1 mg by mouth 3 times a day before meals  Neuropathy - continue lidocaine 5% 1 patch transdermally daily when necessary and Salonpas 8-76% gel apply 1 application topically daily when necessary   Goals of care:  Short-term rehabilitation   Labs/test ordered:  CBC and BMP   Spent 50 minutes in patient care.    [facility], NP [employer] [phone]

## 2014-09-02 ENCOUNTER — Non-Acute Institutional Stay (SKILLED_NURSING_FACILITY): Payer: Medicare Other | Admitting: Internal Medicine

## 2014-09-02 DIAGNOSIS — I10 Essential (primary) hypertension: Secondary | ICD-10-CM

## 2014-09-02 DIAGNOSIS — K529 Noninfective gastroenteritis and colitis, unspecified: Secondary | ICD-10-CM | POA: Diagnosis not present

## 2014-09-02 DIAGNOSIS — M75102 Unspecified rotator cuff tear or rupture of left shoulder, not specified as traumatic: Secondary | ICD-10-CM | POA: Diagnosis not present

## 2014-09-02 DIAGNOSIS — K219 Gastro-esophageal reflux disease without esophagitis: Secondary | ICD-10-CM

## 2014-09-02 DIAGNOSIS — G629 Polyneuropathy, unspecified: Secondary | ICD-10-CM

## 2014-09-02 LAB — BASIC METABOLIC PANEL
BUN: 25 mg/dL — AB (ref 4–21)
CREATININE: 0.9 mg/dL (ref 0.5–1.1)
Glucose: 91 mg/dL
Potassium: 4.5 mmol/L (ref 3.4–5.3)
SODIUM: 138 mmol/L (ref 137–147)

## 2014-09-02 LAB — CBC AND DIFFERENTIAL
HCT: 28 % — AB (ref 36–46)
HEMOGLOBIN: 8.9 g/dL — AB (ref 12.0–16.0)
Platelets: 250 10*3/uL (ref 150–399)
WBC: 6 10*3/mL

## 2014-09-02 NOTE — Progress Notes (Signed)
Patient ID: [name], female   DOB: 1931/01/10, 81 y.o.   MRN: [id number]      [facility]  PCP: Mathews Argyle, MD  Code Status: full code  Allergies  Allergen Reactions  . Amoxicillin-Pot Clavulanate Diarrhea    diarrhea  . Hydromorphone     Pt does not remember  . Lisinopril     fatigue  . Nitrofuran Derivatives Diarrhea  . Septra [Bactrim]     Pt does remember    Chief Complaint  Patient presents with  . New Admit To SNF     HPI:  81 year old patient is here for short term rehabilitation post hospital admission from 08/27/14- 08/29/14 with left rotator cuff tear arthropathy. She underwent left shoulder arthroplasty. She is seen in her room today. Her pain is not controlled with current regimen. She also would not like a breakfast tray in the morning as she does not take breakfast at home. She would like a protein shake of boost or ensure or equivalent with banana instead. No other concerns.  Review of Systems:  Constitutional: Negative for fever, chills, diaphoresis.  HENT: Negative for headache, congestion, nasal discharge Eyes: Negative for eye pain, blurred vision, double vision and discharge.  Respiratory: Negative for cough, shortness of breath and wheezing.   Cardiovascular: Negative for chest pain, palpitations, leg swelling.  Gastrointestinal: Negative for heartburn, nausea, vomiting, abdominal pain. Bowel movement last night Genitourinary: Negative for dysuria Musculoskeletal: Negative for back pain, falls Skin: Negative for itching, rash.  Neurological: Negative for dizziness, tingling, focal weakness Psychiatric/Behavioral: Negative for depression   Past Medical History  Diagnosis Date  . Hypertension   . Chronic diarrhea   . Arthritis   . Osteoporosis   . Goiter   . Neuropathy   . UTI (lower urinary tract infection)     start Tx today /w probiotic- 08/18/2014   . Headache(784.0)     sinus  . Cancer    melanoma  . Complication of anesthesia     hallucinations in the past, last surgery was OK anesth.   . Shortness of breath dyspnea   . COPD (chronic obstructive pulmonary disease)   . GERD (gastroesophageal reflux disease)   . Neuromuscular disorder     peripheral neuropathy - both feet   . Anemia   . History of blood transfusion 2006    due to colon bleeding   . 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   Past Surgical History  Procedure Laterality Date  . Abdominal hysterectomy    . Total knee arthroplasty Bilateral   . Bladder surgery    . Appendectomy    . Hip replacement-bilateral    . Esophagogastroduodenoscopy  04/19/2011    Procedure: ESOPHAGOGASTRODUODENOSCOPY (EGD);  Surgeon: Landry Dyke, MD;  Location: [facility];  Service: Endoscopy;  Laterality: N/A;  mac   . Balloon dilation  04/19/2011    Procedure: BALLOON DILATION;  Surgeon: Landry Dyke, MD;  Location: [facility];  Service: Endoscopy;  Laterality: N/A;  . Joint replacement    . Colon surgery  2006    Dr. Zella Richer- due to diverticulitis  . Reverse shoulder arthroplasty Left 08/27/2014    Procedure: REVERSE LEFT SHOULDER ARTHROPLASTY;  Surgeon: Justice Britain, MD;  Location: [facility];  Service: Orthopedics;  Laterality: Left;   Social History:   reports that she quit smoking about 46 years ago. She does not have  any smokeless tobacco history on file. She reports that she drinks alcohol. She reports that she does not use illicit drugs.  Family History  Problem Relation Age of Onset  . Heart attack Mother   . Stroke Father     Medications: Patient's Medications  New Prescriptions   No medications on file  Previous Medications   ACETAMINOPHEN (TYLENOL) 500 MG TABLET    Take 1,000 mg by mouth every 8 (eight) hours as needed for mild pain.    ALUM HYDROXIDE-MAG TRISILICATE (GAVISCON) 91-63 MG CHEW    Chew 1 tablet by mouth daily as needed (reflux).   CALCIUM CARBONATE (OS-CAL) 600  MG TABS    Take 600 mg by mouth daily.   CALCIUM CARBONATE (TUMS - DOSED IN MG ELEMENTAL CALCIUM) 500 MG CHEWABLE TABLET    Chew 1 tablet by mouth daily as needed for heartburn (reflux.).   DIAZEPAM (VALIUM) 5 MG TABLET    Take 0.5-1 tablets (2.5-5 mg total) by mouth every 6 (six) hours as needed for muscle spasms or sedation.   DIPHENHYDRAMINE-ACETAMINOPHEN (TYLENOL PM) 25-500 MG TABS    Take 1 tablet by mouth at bedtime as needed (pain and sleep.).   DIPHENOXYLATE-ATROPINE (LOMOTIL) 2.5-0.025 MG PER TABLET    Take 4 tablets by mouth 3 (three) times daily before meals.    FAMOTIDINE-CALCIUM CARBONATE-MAGNESIUM HYDROXIDE (PEPCID COMPLETE) 10-800-165 MG CHEW    Chew 1 tablet by mouth daily as needed (reflux.).   GLYCOPYRROLATE (ROBINUL) 1 MG TABLET    Take 1 mg by mouth 3 (three) times daily before meals.    KETOTIFEN (THERA TEARS ALLERGY) 0.025 % OPHTHALMIC SOLUTION    Place 1 drop into both eyes at bedtime.   LIDOCAINE (LIDODERM) 5 %    Place 1 patch onto the skin as needed. Remove & Discard patch within 12 hours or as directed by MD   LOPERAMIDE (IMODIUM) 2 MG CAPSULE    Take 2-4 mg by mouth as needed for diarrhea or loose stools.   MENTHOL, TOPICAL ANALGESIC, (ZIMS MAX-FREEZE) 3.7 % GEL    Apply 1 application topically daily as needed (pain).   MENTHOL-METHYL SALICYLATE (SALONPAS) 8-46 % GEL    Apply 1 application topically daily as needed (pain).   MULTIPLE VITAMINS-MINERALS (MULTIVITAMIN WITH MINERALS) TABLET    Take 1 tablet by mouth daily.   OVER THE COUNTER MEDICATION    Apply 1 application topically at bedtime and may repeat dose one time if needed. "PhysAssist" Foot Pain cream.   OXYCODONE-ACETAMINOPHEN (PERCOCET) 5-325 MG PER TABLET    Take 1-2 tablets by mouth every 4 (four) hours as needed.   PAREGORIC 2 MG/5ML SOLUTION    Take 5 mLs by mouth 3 (three) times daily before meals.   SODIUM CHLORIDE (OCEAN) 0.65 % SOLN NASAL SPRAY    Place 1 spray into both nostrils as needed for congestion.    VALSARTAN (DIOVAN) 80 MG TABLET    Take 80 mg by mouth at bedtime.   Modified Medications   No medications on file  Discontinued Medications   No medications on file     Physical Exam: Filed Vitals:   09/02/14 1726  BP: 158/90  Pulse: 88  Temp: 99 F (37.2 C)  Resp: 18  Height: 5\' 6"  (1.676 m)  Weight: 122 lb 9.6 oz (55.611 kg)  SpO2: 96%    General- elderly female, thin built, in no acute distress Head- normocephalic, atraumatic Throat- moist mucus membrane Eyes- PERRLA, EOMI, no pallor, no icterus, no  discharge, normal conjunctiva, normal sclera Neck- no cervical lymphadenopathy Cardiovascular- normal s1,s2, no murmurs, palpable dorsalis pedis and radial pulses, no leg edema Respiratory- bilateral clear to auscultation, no wheeze, no rhonchi, no crackles, no use of accessory muscles Abdomen- bowel sounds present, soft, non tender Musculoskeletal- left shoulder ROM limited, left arm trace edema, able to move all other 3 extremities Neurological- no focal deficit Skin- warm and dry, good capillary refill in left arm, able to move her digits, right shoulder has aquacel dressing Psychiatry- alert and oriented to person, place and time, normal mood and affect    Labs reviewed: Basic Metabolic Panel:  Recent Labs  08/18/14 1554  NA 139  K 4.2  CL 99*  CO2 30  GLUCOSE 107*  BUN 34*  CREATININE 1.23*  CALCIUM 9.7   Liver Function Tests:  Recent Labs  08/18/14 1554  AST 22  ALT 14  ALKPHOS 76  BILITOT 0.6  PROT 6.8  ALBUMIN 3.8   No results for input(s): LIPASE, AMYLASE in the last 8760 hours. No results for input(s): AMMONIA in the last 8760 hours. CBC:  Recent Labs  08/18/14 1554  WBC 5.7  NEUTROABS 3.9  HGB 12.9  HCT 39.0  MCV 95.4  PLT 222    Assessment/Plan   Left rotator cuff tear arthropathy  S/P reverse left shoulder arthroplasty. Will have her work with physical therapy and occupational therapy team to help with gait training and  muscle strengthening exercises.fall precautions. Skin care. Encourage to be out of bed. Has follow up with orthopedics. Continue valium 2.5-5 mg q6h prn muscle spasm. started on oxycodone 2.5 mg tid yesterday and is also on Percocet 5/325 mg 1-2 tabs by mouth q4h prn pain. Change oxycodone to 5 mg 6 am and 10 pm and continue 2.5 mg in afternoon and reassess. Continue to wear sling  Hypertension Elevated bp, her pain could be contributing to it. Denies any chest pain or dyspnea. continue Diovan 80 mg daily and check bp q shift for now and reassess  gerd Stable, continue pepcid daily prn   Chronic diarrhea Stable, continue Lomotil and paregoric with robinul tid for now  Neuropathy continue lidocaine 5% patch prn with salonpas   Goals of care:  Short-term rehabilitation   Labs/tests ordered  Family/ staff Communication: reviewed care plan with patient and nursing supervisor    Blanchie Serve, MD  [facility] [phone] (Monday-Friday 8 am - 5 pm) [phone] (afterhours)

## 2014-09-14 ENCOUNTER — Non-Acute Institutional Stay (SKILLED_NURSING_FACILITY): Payer: Medicare Other | Admitting: Internal Medicine

## 2014-09-14 ENCOUNTER — Encounter: Payer: Self-pay | Admitting: Internal Medicine

## 2014-09-14 DIAGNOSIS — N3000 Acute cystitis without hematuria: Secondary | ICD-10-CM

## 2014-09-14 DIAGNOSIS — I10 Essential (primary) hypertension: Secondary | ICD-10-CM

## 2014-09-14 NOTE — Progress Notes (Signed)
Patient ID: [name], female   DOB: 1930-04-07, 81 y.o.   MRN: [id number]    [place]  Code Status: Full Code   Allergies  Allergen Reactions  . Hydromorphone     Pt does not remember  . Lisinopril     fatigue  . Nitrofuran Derivatives Diarrhea  . Septra [Bactrim]     Pt does remember   Chief Complaint  Patient presents with  . Acute Visit    Elevated blood pressure and urinary concerns   HPI:  80 year old patient is seen with acute concerns. Her BP reading have been elevated. SBP 150-169 and DBP 85-99. She also complaints of cloudy appearing urine with strong odor for past few days with dysuria. While reviewing her allergy list, mentions that she is not allergic to penicillin. No other concerns. She is here for STR  Post left shoulder arthroplasty for left rotator cuff tear arthropathy.   Review of Systems:  Constitutional: Negative for fever, chills, diaphoresis.  HENT: Negative for headache, congestion, nasal discharge Eyes: Negative for eye pain, blurred vision, double vision and discharge.  Respiratory: Negative for cough, shortness of breath and wheezing.   Cardiovascular: Negative for chest pain, palpitations, leg swelling.  Gastrointestinal: Negative for heartburn, nausea, vomiting, abdominal pain.  GU: negative for hematuria and flank pain Psychiatric/Behavioral: Negative for depression  Past Medical History  Diagnosis Date  . Hypertension   . Chronic diarrhea   . Arthritis   . Osteoporosis   . Goiter   . Neuropathy   . UTI (lower urinary tract infection)     start Tx today /w probiotic- 08/18/2014   . Headache(784.0)     sinus  . Cancer     melanoma  . Complication of anesthesia     hallucinations in the past, last surgery was OK anesth.   . Shortness of breath dyspnea   . COPD (chronic obstructive pulmonary disease)   . GERD (gastroesophageal reflux disease)   . Neuromuscular disorder     peripheral neuropathy - both feet   .  Anemia   . History of blood transfusion 2006    due to colon bleeding   . 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    Current Outpatient Prescriptions on File Prior to Visit  Medication Sig Dispense Refill  . alum hydroxide-mag trisilicate (GAVISCON) 10-07 MG CHEW Chew 1 tablet by mouth daily as needed (reflux).    . calcium carbonate (OS-CAL) 600 MG TABS Take 600 mg by mouth daily.    . calcium carbonate (TUMS - DOSED IN MG ELEMENTAL CALCIUM) 500 MG chewable tablet Chew 1 tablet by mouth daily as needed for heartburn (reflux.).    Marland Kitchen diphenhydramine-acetaminophen (TYLENOL PM) 25-500 MG TABS Take 1 tablet by mouth at bedtime as needed (pain and sleep.).    Marland Kitchen diphenoxylate-atropine (LOMOTIL) 2.5-0.025 MG per tablet Take 4 tablets by mouth 3 (three) times daily before meals.     . famotidine-calcium carbonate-magnesium hydroxide (PEPCID COMPLETE) 10-800-165 MG CHEW Chew 1 tablet by mouth daily as needed (reflux.).    Marland Kitchen glycopyrrolate (ROBINUL) 1 MG tablet Take 1 mg by mouth 3 (three) times daily before meals.     Marland Kitchen ketotifen (THERA TEARS ALLERGY) 0.025 % ophthalmic solution Place 1 drop into both eyes at bedtime.    . lidocaine (LIDODERM) 5 % Place 1 patch onto the skin as needed. Remove & Discard patch within 12  hours or as directed by MD    . loperamide (IMODIUM) 2 MG capsule Take 2-4 mg by mouth as needed for diarrhea or loose stools.    . Menthol, Topical Analgesic, (ZIMS MAX-FREEZE) 3.7 % GEL Apply 1 application topically daily as needed (pain).    . Menthol-Methyl Salicylate (SALONPAS) 7-68 % GEL Apply 1 application topically daily as needed (pain).    . Multiple Vitamins-Minerals (MULTIVITAMIN WITH MINERALS) tablet Take 1 tablet by mouth daily.    Marland Kitchen oxyCODONE-acetaminophen (PERCOCET) 5-325 MG per tablet Take 1-2 tablets by mouth every 4 (four) hours as needed. 60 tablet 0  . paregoric 2 MG/5ML solution Take 5 mLs by mouth 3 (three) times daily before meals.    .  sodium chloride (OCEAN) 0.65 % SOLN nasal spray Place 1 spray into both nostrils as needed for congestion.    . valsartan (DIOVAN) 80 MG tablet Take 80 mg by mouth at bedtime.      No current facility-administered medications on file prior to visit.    Physical exam BP 153/83 mmHg  Pulse 77  Temp(Src) 97.2 F (36.2 C) (Oral)  Resp 16  General- elderly female, thin built, in no acute distress Head- normocephalic, atraumatic Throat- moist mucus membrane Eyes- PERRLA, EOMI, no pallor, no icterus, no discharge, normal conjunctiva, normal sclera Neck- no cervical lymphadenopathy Cardiovascular- normal s1,s2, no murmurs, palpable dorsalis pedis and radial pulses, no leg edema Respiratory- bilateral clear to auscultation, no wheeze, no rhonchi, no crackles, no use of accessory muscles Abdomen- bowel sounds present, soft, non tender Musculoskeletal- left shoulder ROM limited, left arm trace edema, able to move all other 3 extremities Neurological- no focal deficit Skin- warm and dry, good capillary refill in left arm, able to move her digits, right shoulder has aquacel dressing Psychiatry- alert and oriented to person, place and time, normal mood and affect  Assessment/plan  Uncontrolled HTN Elevated bp. Start amlodipine 5 mg daily, continue current regimen diovan and monitor bp q shift  Acute cystitis Send u/a with c/s. With her being symptomatic, start ciprofloxacin 250 mg bid for 5 days and add florastor 250 mg bid for 1 week. F/u on culture report  Of note, will remove PCN from allergy list after confirming with patient  Blanchie Serve, MD  [place] (Monday-Friday 8 am - 5 pm) [phone] (afterhours)

## 2014-09-29 ENCOUNTER — Encounter: Payer: Self-pay | Admitting: Adult Health

## 2014-09-29 ENCOUNTER — Non-Acute Institutional Stay (SKILLED_NURSING_FACILITY): Payer: Medicare Other | Admitting: Adult Health

## 2014-09-29 DIAGNOSIS — K219 Gastro-esophageal reflux disease without esophagitis: Secondary | ICD-10-CM

## 2014-09-29 DIAGNOSIS — M75102 Unspecified rotator cuff tear or rupture of left shoulder, not specified as traumatic: Principal | ICD-10-CM

## 2014-09-29 DIAGNOSIS — M12512 Traumatic arthropathy, left shoulder: Secondary | ICD-10-CM | POA: Diagnosis not present

## 2014-09-29 DIAGNOSIS — Z96612 Presence of left artificial shoulder joint: Secondary | ICD-10-CM | POA: Diagnosis not present

## 2014-09-29 DIAGNOSIS — E43 Unspecified severe protein-calorie malnutrition: Secondary | ICD-10-CM

## 2014-09-29 DIAGNOSIS — I10 Essential (primary) hypertension: Secondary | ICD-10-CM | POA: Diagnosis not present

## 2014-09-29 DIAGNOSIS — K529 Noninfective gastroenteritis and colitis, unspecified: Secondary | ICD-10-CM | POA: Diagnosis not present

## 2014-09-29 DIAGNOSIS — G629 Polyneuropathy, unspecified: Secondary | ICD-10-CM | POA: Diagnosis not present

## 2014-09-29 DIAGNOSIS — M12812 Other specific arthropathies, not elsewhere classified, left shoulder: Secondary | ICD-10-CM

## 2014-10-30 ENCOUNTER — Encounter (HOSPITAL_COMMUNITY): Payer: Self-pay | Admitting: *Deleted

## 2014-10-30 ENCOUNTER — Emergency Department (HOSPITAL_COMMUNITY)
Admission: EM | Admit: 2014-10-30 | Discharge: 2014-10-30 | Disposition: A | Discharge status: Home / Self Care | Payer: Medicare Other | Attending: Emergency Medicine | Admitting: Emergency Medicine

## 2014-10-30 DIAGNOSIS — I10 Essential (primary) hypertension: Secondary | ICD-10-CM | POA: Diagnosis not present

## 2014-10-30 DIAGNOSIS — R197 Diarrhea, unspecified: Secondary | ICD-10-CM | POA: Insufficient documentation

## 2014-10-30 DIAGNOSIS — J449 Chronic obstructive pulmonary disease, unspecified: Secondary | ICD-10-CM | POA: Insufficient documentation

## 2014-10-30 DIAGNOSIS — Z8659 Personal history of other mental and behavioral disorders: Secondary | ICD-10-CM | POA: Diagnosis not present

## 2014-10-30 DIAGNOSIS — Z85828 Personal history of other malignant neoplasm of skin: Secondary | ICD-10-CM | POA: Diagnosis not present

## 2014-10-30 DIAGNOSIS — Z9049 Acquired absence of other specified parts of digestive tract: Secondary | ICD-10-CM

## 2014-10-30 DIAGNOSIS — K529 Noninfective gastroenteritis and colitis, unspecified: Secondary | ICD-10-CM

## 2014-10-30 DIAGNOSIS — Z87891 Personal history of nicotine dependence: Secondary | ICD-10-CM | POA: Insufficient documentation

## 2014-10-30 DIAGNOSIS — Z8719 Personal history of other diseases of the digestive system: Secondary | ICD-10-CM | POA: Insufficient documentation

## 2014-10-30 DIAGNOSIS — Z79899 Other long term (current) drug therapy: Secondary | ICD-10-CM | POA: Diagnosis not present

## 2014-10-30 DIAGNOSIS — Z8669 Personal history of other diseases of the nervous system and sense organs: Secondary | ICD-10-CM | POA: Diagnosis not present

## 2014-10-30 DIAGNOSIS — Z862 Personal history of diseases of the blood and blood-forming organs and certain disorders involving the immune mechanism: Secondary | ICD-10-CM | POA: Insufficient documentation

## 2014-10-30 DIAGNOSIS — N390 Urinary tract infection, site not specified: Secondary | ICD-10-CM | POA: Insufficient documentation

## 2014-10-30 LAB — I-STAT CG4 LACTIC ACID, ED: LACTIC ACID, VENOUS: 0.46 mmol/L — AB (ref 0.5–2.0)

## 2014-10-30 LAB — COMPREHENSIVE METABOLIC PANEL
ALT: 11 U/L — AB (ref 14–54)
ANION GAP: 7 (ref 5–15)
AST: 19 U/L (ref 15–41)
Albumin: 3.7 g/dL (ref 3.5–5.0)
Alkaline Phosphatase: 92 U/L (ref 38–126)
BUN: 32 mg/dL — ABNORMAL HIGH (ref 6–20)
CO2: 29 mmol/L (ref 22–32)
CREATININE: 1.16 mg/dL — AB (ref 0.44–1.00)
Calcium: 9.3 mg/dL (ref 8.9–10.3)
Chloride: 104 mmol/L (ref 101–111)
GFR, EST AFRICAN AMERICAN: 49 mL/min — AB (ref >60)
GFR, EST NON AFRICAN AMERICAN: 43 mL/min — AB (ref >60)
Glucose, Bld: 93 mg/dL (ref 65–99)
Potassium: 4.6 mmol/L (ref 3.5–5.1)
SODIUM: 140 mmol/L (ref 135–145)
Total Bilirubin: 0.3 mg/dL (ref 0.3–1.2)
Total Protein: 7 g/dL (ref 6.5–8.1)

## 2014-10-30 LAB — CBC WITH DIFFERENTIAL/PLATELET
Basophils Absolute: 0 10*3/uL (ref 0.0–0.1)
Basophils Relative: 0 % (ref 0–1)
EOS ABS: 0.1 10*3/uL (ref 0.0–0.7)
EOS PCT: 1 % (ref 0–5)
HCT: 36.5 % (ref 36.0–46.0)
Hemoglobin: 11.8 g/dL — ABNORMAL LOW (ref 12.0–15.0)
LYMPHS ABS: 1.3 10*3/uL (ref 0.7–4.0)
LYMPHS PCT: 17 % (ref 12–46)
MCH: 30.3 pg (ref 26.0–34.0)
MCHC: 32.3 g/dL (ref 30.0–36.0)
MCV: 93.8 fL (ref 78.0–100.0)
MONOS PCT: 7 % (ref 3–12)
Monocytes Absolute: 0.6 10*3/uL (ref 0.1–1.0)
Neutro Abs: 5.8 10*3/uL (ref 1.7–7.7)
Neutrophils Relative %: 75 % (ref 43–77)
Platelets: 229 10*3/uL (ref 150–400)
RBC: 3.89 MIL/uL (ref 3.87–5.11)
RDW: 13.4 % (ref 11.5–15.5)
WBC: 7.7 10*3/uL (ref 4.0–10.5)

## 2014-10-30 LAB — URINALYSIS, ROUTINE W REFLEX MICROSCOPIC
Bilirubin Urine: NEGATIVE
Glucose, UA: NEGATIVE mg/dL
Ketones, ur: NEGATIVE mg/dL
Nitrite: POSITIVE — AB
PROTEIN: 100 mg/dL — AB
Specific Gravity, Urine: 1.021 (ref 1.005–1.030)
UROBILINOGEN UA: 0.2 mg/dL (ref 0.0–1.0)
pH: 6 (ref 5.0–8.0)

## 2014-10-30 LAB — URINE MICROSCOPIC-ADD ON

## 2014-10-30 MED ORDER — DIPHENOXYLATE-ATROPINE 2.5-0.025 MG PO TABS: 4.0000 | ORAL_TABLET | Freq: Three times a day (TID) | ORAL | Status: DC

## 2014-10-30 MED ORDER — SODIUM CHLORIDE 0.9 % IV BOLUS (SEPSIS): 1000.0000 mL | Freq: Once | INTRAVENOUS | Status: DC

## 2014-10-30 MED ORDER — FOSFOMYCIN TROMETHAMINE 3 G PO PACK
3.0000 g | PACK | Freq: Once | ORAL | Status: AC
  Administered 2014-10-30: 3 g via ORAL
  Filled 2014-10-30: qty 3

## 2014-10-30 NOTE — Discharge Instructions (Signed)
Urinary Tract Infection Urinary tract infections (UTIs) can develop anywhere along your urinary tract. Your urinary tract is your body's drainage system for removing wastes and extra water. Your urinary tract includes two kidneys, two ureters, a bladder, and a urethra. Your kidneys are a pair of bean-shaped organs. Each kidney is about the size of your fist. They are located below your ribs, one on each side of your spine. CAUSES Infections are caused by microbes, which are microscopic organisms, including fungi, viruses, and bacteria. These organisms are so small that they can only be seen through a microscope. Bacteria are the microbes that most commonly cause UTIs. SYMPTOMS  Symptoms of UTIs may vary by age and gender of the patient and by the location of the infection. Symptoms in young women typically include a frequent and intense urge to urinate and a painful, burning feeling in the bladder or urethra during urination. Older women and men are more likely to be tired, shaky, and weak and have muscle aches and abdominal pain. A fever may mean the infection is in your kidneys. Other symptoms of a kidney infection include pain in your back or sides below the ribs, nausea, and vomiting. DIAGNOSIS To diagnose a UTI, your caregiver will ask you about your symptoms. Your caregiver also will ask to provide a urine sample. The urine sample will be tested for bacteria and white blood cells. White blood cells are made by your body to help fight infection. TREATMENT  Typically, UTIs can be treated with medication. Because most UTIs are caused by a bacterial infection, they usually can be treated with the use of antibiotics. The choice of antibiotic and length of treatment depend on your symptoms and the type of bacteria causing your infection. HOME CARE INSTRUCTIONS  If you were prescribed antibiotics, take them exactly as your caregiver instructs you. Finish the medication even if you feel better after you  have only taken some of the medication.  Drink enough water and fluids to keep your urine clear or pale yellow.  Avoid caffeine, tea, and carbonated beverages. They tend to irritate your bladder.  Empty your bladder often. Avoid holding urine for long periods of time.  Empty your bladder before and after sexual intercourse.  After a bowel movement, women should cleanse from front to back. Use each tissue only once. SEEK MEDICAL CARE IF:   You have back pain.  You develop a fever.  Your symptoms do not begin to resolve within 3 days. SEEK IMMEDIATE MEDICAL CARE IF:   You have severe back pain or lower abdominal pain.  You develop chills.  You have nausea or vomiting.  You have continued burning or discomfort with urination. MAKE SURE YOU:   Understand these instructions.  Will watch your condition.  Will get help right away if you are not doing well or get worse. Document Released: 11/23/2004 Document Revised: 08/15/2011 Document Reviewed: 03/24/2011 [facility] Patient Information 2015 [place], [place]. This information is not intended to replace advice given to you by your health care provider. Make sure you discuss any questions you have with your health care provider. Chronic Diarrhea Diarrhea is frequent loose and watery bowel movements. It can cause you to feel weak and dehydrated. Dehydration can cause you to become tired and thirsty and to have a dry mouth, decreased urination, and dark yellow urine. Diarrhea is a sign of another problem, most often an infection that will not last long. In most cases, diarrhea lasts 2-3 days. Diarrhea that lasts longer  than 4 weeks is called long-lasting (chronic) diarrhea. It is important to treat your diarrhea as directed by your health care provider to lessen or prevent future episodes of diarrhea.  CAUSES  There are many causes of chronic diarrhea. The following are some possible causes:   Gastrointestinal infections caused by viruses,  bacteria, or parasites.   Food poisoning or food allergies.   Certain medicines, such as antibiotics, chemotherapy, and laxatives.   Artificial sweeteners and fructose.   Digestive disorders, such as celiac disease and inflammatory bowel diseases.   Irritable bowel syndrome.  Some disorders of the pancreas.  Disorders of the thyroid.  Reduced blood flow to the intestines.  Cancer. Sometimes the cause of chronic diarrhea is unknown. RISK FACTORS  Having a severely weakened immune system, such as from HIV or AIDS.   Taking certain types of cancer-fighting drugs (such as with chemotherapy) or other medicines.   Having had a recent organ transplant.   Having a portion of the stomach or small bowel removed.   Traveling to countries where food and water supplies are often contaminated.  SYMPTOMS  In addition to frequent, loose stools, diarrhea may cause:   Cramping.   Abdominal pain.   Nausea.   Fever.  Fatigue.  Urgent need to use the bathroom.  Loss of bowel control. DIAGNOSIS  Your health care provider must take a careful history and perform a physical exam. Tests given are based on your symptoms and history. Tests may include:   Blood or stool tests. Three or more stool samples may be examined. Stool cultures may be used to test for bacteria or parasites.   X-rays.   A procedure in which a thin tube is inserted into the mouth or rectum (endoscopy). This allows the health care provider to look inside the intestine.  TREATMENT   Treatment is aimed at correcting the cause of the diarrhea when possible.  Diarrhea caused by an infection can often be treated with antibiotic medicines.  Diarrhea not caused by an infection may require you to take long-term medicine or have surgery. Specific treatment should be discussed with your health care provider.  If the cause cannot be determined, treatment aims to relieve symptoms and prevent dehydration.  Serious health problems can occur if you do not maintain proper fluid levels. Treatment may include:  Taking an oral rehydration solution (ORS).  Not drinking beverages that contain caffeine (such as tea, coffee, and soft drinks).  Not drinking alcohol.  Maintaining well-balanced nutrition to help you recover faster. HOME CARE INSTRUCTIONS   Drink enough fluids to keep urine clear or pale yellow. Drink 1 cup (8 oz) of fluid for each diarrhea episode. Avoid fluids that contain simple sugars, fruit juices, whole milk products, and sodas. Hydrate with an ORS. You may purchase the ORS or prepare it at home by mixing the following ingredients together:   - tsp (1.7-3  mL) table salt.   tsp (3  mL) baking soda.   tsp (1.7 mL) salt substitute containing potassium chloride.  1 tbsp (20 mL) sugar.  4.2 c (1 L) of water.   Certain foods and beverages may increase the speed at which food moves through the gastrointestinal (GI) tract. These foods and beverages should be avoided. They include:  Caffeinated and alcoholic beverages.  High-fiber foods, such as raw fruits and vegetables, nuts, seeds, and whole grain breads and cereals.  Foods and beverages sweetened with sugar alcohols, such as xylitol, sorbitol, and mannitol.   Some foods  may be well tolerated and may help thicken stool. These include:  Starchy foods, such as rice, toast, pasta, low-sugar cereal, oatmeal, grits, baked potatoes, crackers, and bagels.  Bananas.  Applesauce.  Add probiotic-rich foods to help increase healthy bacteria in the GI tract. These include yogurt and fermented milk products.  Wash your hands well after each diarrhea episode.  Only take over-the-counter or prescription medicines as directed by your health care provider.  Take a warm bath to relieve any burning or pain from frequent diarrhea episodes. SEEK MEDICAL CARE IF:   You are not urinating as often.  Your urine is a dark color.  You  become very tired or dizzy.  You have severe pain in the abdomen or rectum.  Your have blood or pus in your stools.  Your stools look black and tarry. SEEK IMMEDIATE MEDICAL CARE IF:   You are unable to keep fluids down.  You have persistent vomiting.  You have blood in your stool.  Your stools are black and tarry.  You do not urinate in 6-8 hours, or there is only a small amount of very dark urine.  You have abdominal pain that increases or localizes.  You have weakness, dizziness, confusion, or lightheadedness.  You have a severe headache.  Your diarrhea gets worse or does not get better.  You have a fever or persistent symptoms for more than 2-3 days.  You have a fever and your symptoms suddenly get worse. MAKE SURE YOU:   Understand these instructions.  Will watch your condition.  Will get help right away if you are not doing well or get worse. Document Released: 05/06/2003 Document Revised: 02/18/2013 Document Reviewed: 08/08/2012 [facility] Patient Information 2015 [place], [place]. This information is not intended to replace advice given to you by your health care provider. Make sure you discuss any questions you have with your health care provider.

## 2014-10-30 NOTE — ED Provider Notes (Signed)
CSN: [id number]     Arrival date & time 10/30/14  1557 History   First MD Initiated Contact with Patient 10/30/14 1613     Chief Complaint  Patient presents with  . Diarrhea  . Fatigue     (Consider location/radiation/quality/duration/timing/severity/associated sxs/prior Treatment) Patient is a 81 y.o. female presenting with diarrhea. The history is provided by the patient.  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 Hypertension   . Chronic diarrhea   . Arthritis   . Osteoporosis   . Goiter   . Neuropathy   . UTI (lower urinary tract infection)     start Tx today /w probiotic- 08/18/2014   . Headache(784.0)     sinus  . Cancer     melanoma  . Complication of anesthesia     hallucinations in the past, last surgery was OK anesth.   . Shortness of breath dyspnea   . COPD (chronic obstructive pulmonary disease)   . GERD (gastroesophageal reflux disease)   . Neuromuscular disorder     peripheral neuropathy - both feet   . Anemia   . History of blood transfusion 2006    due to colon bleeding   . 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   Past Surgical History  Procedure Laterality Date  . Abdominal hysterectomy    . Total knee arthroplasty Bilateral   . Bladder surgery    . Appendectomy    . Hip replacement-bilateral    . Esophagogastroduodenoscopy  04/19/2011    Procedure: ESOPHAGOGASTRODUODENOSCOPY (EGD);  Surgeon: Landry Dyke, MD;  Location: [facility];  Service:  Endoscopy;  Laterality: N/A;  mac   . Balloon dilation  04/19/2011    Procedure: BALLOON DILATION;  Surgeon: Landry Dyke, MD;  Location: [facility];  Service: Endoscopy;  Laterality: N/A;  . Joint replacement    . Colon surgery  2006    Dr. Zella Richer- due to diverticulitis  . Reverse shoulder arthroplasty Left 08/27/2014    Procedure: REVERSE LEFT SHOULDER ARTHROPLASTY;  Surgeon: Justice Britain, MD;  Location: [facility];  Service: Orthopedics;  Laterality: Left;   Family History  Problem Relation Age of Onset  . Heart attack Mother   . Stroke Father    Social History  Substance Use Topics  . Smoking status: Former Smoker    Quit date: 02/28/1968  . Smokeless tobacco: None  . Alcohol Use: Yes     Comment: rarely   OB History    Gravida Para Term Preterm AB TAB SAB Ectopic Multiple Living            1     Review of Systems  Constitutional: Negative for fever.  Gastrointestinal: Positive for diarrhea. Negative for vomiting and abdominal pain.  All other systems reviewed and are negative.     Allergies  Hydromorphone; Lisinopril; Mirtazapine; Nitrofuran derivatives; and Septra  Home Medications   Prior to Admission medications   Medication Sig Start Date End Date Taking? Authorizing Provider  acetaminophen (  TYLENOL) 650 MG CR tablet Take 650 mg by mouth at bedtime.   Yes Historical Provider, MD  alum hydroxide-mag trisilicate (GAVISCON) 61-95 MG CHEW Chew 1 tablet by mouth daily as needed (reflux).   Yes Historical Provider, MD  antiseptic oral rinse (BIOTENE) LIQD 15 mLs by Mouth Rinse route as needed for dry mouth.   Yes Historical Provider, MD  calcium carbonate (TUMS - DOSED IN MG ELEMENTAL CALCIUM) 500 MG chewable tablet Chew 1 tablet by mouth daily as needed for heartburn (reflux.).   Yes Historical Provider, MD  calcium-vitamin D (OSCAL WITH D) 500-200 MG-UNIT per tablet Take 1 tablet by mouth daily with breakfast.   Yes Historical Provider, MD  glycopyrrolate  (ROBINUL) 1 MG tablet Take 1 mg by mouth 3 (three) times daily before meals.    Yes Historical Provider, MD  lidocaine (LIDODERM) 5 % Place 1 patch onto the skin as needed. Remove & Discard patch within 12 hours or as directed by MD   Yes Historical Provider, MD  loperamide (IMODIUM) 2 MG capsule Take 2-4 mg by mouth as needed for diarrhea or loose stools.   Yes Historical Provider, MD  Menthol, Topical Analgesic, (ZIMS MAX-FREEZE) 3.7 % GEL Apply 1 application topically daily as needed (pain).   Yes Historical Provider, MD  Multiple Vitamins-Minerals (MULTIVITAMIN WITH MINERALS) tablet Take 1 tablet by mouth daily.   Yes Historical Provider, MD  naphazoline-glycerin (CLEAR EYES) 0.012-0.2 % SOLN Place 1-2 drops into both eyes every 4 (four) hours as needed for irritation.   Yes Historical Provider, MD  OVER THE COUNTER MEDICATION Apply 1 application topically at bedtime. "PhysAssist" foot pain cream: oils/extracts of tea tree, arnica, peppermint, and menthol in a glycerin cream base.   Yes Historical Provider, MD  paregoric 2 MG/5ML solution Take 5 mLs by mouth 3 (three) times daily before meals.   Yes Historical Provider, MD  sodium chloride (OCEAN) 0.65 % SOLN nasal spray Place 1 spray into both nostrils as needed for congestion.   Yes Historical Provider, MD  valsartan (DIOVAN) 80 MG tablet Take 80 mg by mouth at bedtime.    Yes Historical Provider, MD  white petrolatum (VASELINE) GEL Apply 1 application topically as needed for lip care.   Yes Historical Provider, MD  diphenoxylate-atropine (LOMOTIL) 2.5-0.025 MG per tablet Take 4 tablets by mouth 3 (three) times daily before meals. 10/30/14   Leo Grosser, MD  oxyCODONE-acetaminophen (PERCOCET) 5-325 MG per tablet Take 1-2 tablets by mouth every 4 (four) hours as needed. Patient not taking: Reported on 10/30/2014 08/28/14   Olivia Mackie Shuford, PA-C   BP 178/88 mmHg  Pulse 97  Temp(Src) 98.1 F (36.7 C) (Oral)  Resp 16  Ht 5\' 6"  (1.676 m)  Wt 116 lb  (52.617 kg)  BMI 18.73 kg/m2  SpO2 100% Physical Exam  Constitutional: She is oriented to person, place, and time. She appears well-developed and well-nourished. No distress.  HENT:  Head: Normocephalic.  Eyes: Conjunctivae are normal.  Neck: Neck supple. No tracheal deviation present.  Cardiovascular: Normal rate, regular rhythm and normal heart sounds.   Pulmonary/Chest: Effort normal and breath sounds normal. No respiratory distress.  Abdominal: Soft. She exhibits no distension. There is no tenderness. There is no rebound and no guarding.  Neurological: She is alert and oriented to person, place, and time.  Skin: Skin is warm and dry.  Psychiatric: She has a normal mood and affect.    ED Course  Procedures (including critical care time) Labs Review Labs  Reviewed  CBC WITH DIFFERENTIAL/PLATELET - Abnormal; Notable for the following:    Hemoglobin 11.8 (*)    All other components within normal limits  COMPREHENSIVE METABOLIC PANEL - Abnormal; Notable for the following:    BUN 32 (*)    Creatinine, Ser 1.16 (*)    ALT 11 (*)    GFR calc non Af Amer 43 (*)    GFR calc Af Amer 49 (*)    All other components within normal limits  URINALYSIS, ROUTINE W REFLEX MICROSCOPIC (NOT AT [facility]) - Abnormal; Notable for the following:    APPearance TURBID (*)    Hgb urine dipstick LARGE (*)    Protein, ur 100 (*)    Nitrite POSITIVE (*)    Leukocytes, UA LARGE (*)    All other components within normal limits  URINE MICROSCOPIC-ADD ON - Abnormal; Notable for the following:    Bacteria, UA MANY (*)    All other components within normal limits  I-STAT CG4 LACTIC ACID, ED - Abnormal; Notable for the following:    Lactic Acid, Venous 0.46 (*)    All other components within normal limits  URINE CULTURE    Imaging Review No results found. I have personally reviewed and evaluated these images and lab results as part of my medical decision-making.   EKG Interpretation None      MDM    Final diagnoses:  Chronic diarrhea  S/P colectomy  Acute lower UTI    81 y.o. female presents from urology clinic where she went with UTI symptoms. Pt complaining of chronic diarrheal symptoms since being admitted to hospital remotely and there was concern for possible c. Diff. Pt unable to produce stool during ED stay. Labs reassuring except signs of acute lower UTI with symptoms of frequency. Provided prescription to try different pharmacy. Diarrhea has been chronic. Has not been able to fill her lomotil lately d/t pharmacy not carrying. Pt unlikely to have c.diff colitis as she does not have a colon, no abdominal pain, no fever, no elevation in WBC count or other concerning features currently. Has f/u with gastroenterology in 4 days. I recommended she attend this appointment regarding her chronic diarrhea and weight loss. No acute indication for imaging. Pt has history of ESBL species on prior urine cultures so was provided single dose fosfomycin to cover. Culture sent for speciation. Plan to follow up with PCP as needed and return precautions discussed for worsening or new concerning symptoms.     Leo Grosser, MD 10/31/14 [phone]

## 2014-10-30 NOTE — ED Notes (Signed)
Pt sent here from Urology. Was being seen for UTI, symptoms (burning, urgency) for past week. Pt reports shoulder replacement surgery 08-27-14 with 2 rehab stays in assisted living and skilled nursing facilities. Received abx during these stays. While at urology clinic, she had diarrhea and the clinic felt it would be best for her to come to ED for evaluation. She reports diarrhea x1 month, approx 1-3 episodes a day.

## 2014-11-02 LAB — URINE CULTURE: Culture: >=100000

## 2014-11-03 ENCOUNTER — Telehealth (HOSPITAL_BASED_OUTPATIENT_CLINIC_OR_DEPARTMENT_OTHER): Payer: Self-pay | Admitting: Emergency Medicine

## 2014-11-03 NOTE — Telephone Encounter (Signed)
Post ED Visit - Positive Culture Follow-up  Culture report reviewed by antimicrobial stewardship pharmacist:  []  Heide Guile, Pharm.D., BCPS []  Alycia Rossetti, Pharm.D., BCPS []  [place], [place].D., BCPS, AAHIVP []  Legrand Como, Pharm.D., BCPS, AAHIVP [x]  [employer], Pharm.D. []  Milus Glazier, Pharm.D.  Positive urine culture E. Coli  Treated with fosfomycin, organism sensitive to the same and no further patient follow-up is required at this time.  Hazle Nordmann 11/03/2014, 10:17 AM

## 2014-11-05 ENCOUNTER — Other Ambulatory Visit: Payer: Self-pay | Admitting: Geriatric Medicine

## 2014-11-05 DIAGNOSIS — E049 Nontoxic goiter, unspecified: Secondary | ICD-10-CM

## 2014-11-10 ENCOUNTER — Ambulatory Visit
Admission: RE | Admit: 2014-11-10 | Discharge: 2014-11-10 | Disposition: A | Discharge status: Home / Self Care | Payer: Medicare Other | Source: Ambulatory Visit | Attending: Geriatric Medicine | Admitting: Geriatric Medicine

## 2014-11-10 DIAGNOSIS — E049 Nontoxic goiter, unspecified: Secondary | ICD-10-CM

## 2014-12-13 NOTE — Progress Notes (Signed)
Patient ID: [name], female   DOB: 1931-01-26, 81 y.o.   MRN: [id number]   09/29/14  Facility:  [facility] Home Location:  [place] Room Number: 103-P LEVEL OF CARE:  SNF (31)   Chief Complaint  Patient presents with  . Discharge Note    Left rotator cuff tear arthropathy S/P reverse left shoulder arthroplasty, hypertension, chronic diarrhea, GERD and neuropathy    HISTORY OF PRESENT ILLNESS:  This is an 81 year old female who is for discharge to ALF with Home health PT and OT. She has been admitted to [facility] on 08/29/14 from [facility]. She has PMH of hypertension, chronic diarrhea, arthritis, osteoporosis, goiter, neuropathy, melanoma, COPD, GERD, peripheral neuropathy, anemia and depression. She has left rotator cuff tear arthropathy and had reverse left shoulder arthroplasty on 08/27/14.  Patient was admitted to this facility for short-term rehabilitation after the patient's recent hospitalization.  Patient has completed SNF rehabilitation and therapy has cleared the patient for discharge.   PAST MEDICAL HISTORY:  Past Medical History  Diagnosis Date  . Hypertension   . Chronic diarrhea   . Arthritis   . Osteoporosis   . Goiter   . Neuropathy   . UTI (lower urinary tract infection)     start Tx today /w probiotic- 08/18/2014   . Headache(784.0)     sinus  . Cancer     melanoma  . Complication of anesthesia     hallucinations in the past, last surgery was OK anesth.   . Shortness of breath dyspnea   . COPD (chronic obstructive pulmonary disease)   . GERD (gastroesophageal reflux disease)   . Neuromuscular disorder     peripheral neuropathy - both feet   . Anemia   . History of blood transfusion 2006    due to colon bleeding   . 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    CURRENT MEDICATIONS: Reviewed per MAR/see medication list  Allergies  Allergen Reactions  . Hydromorphone     Pt does not  remember  . Lisinopril     fatigue  . Mirtazapine     Pt doesn't remember.    . Nitrofuran Derivatives Diarrhea  . Septra [Bactrim]     Pt doesn't remember.      REVIEW OF SYSTEMS:  GENERAL: no change in appetite, no fatigue, no weight changes, no fever, chills or weakness RESPIRATORY: no cough, SOB, DOE, wheezing, hemoptysis CARDIAC: no chest pain, edema or palpitations GI: no abdominal pain, diarrhea, constipation, heart burn, nausea or vomiting  PHYSICAL EXAMINATION  GENERAL: no acute distress, normal body habitus SKIN:  Left shoulder surgical site is dry, no erythema EYES: conjunctivae normal, sclerae normal, normal eye lids NECK: supple, trachea midline, no neck masses, no thyroid tenderness, no thyromegaly LYMPHATICS: no LAN in the neck, no supraclavicular LAN RESPIRATORY: breathing is even & unlabored, BS CTAB CARDIAC: RRR, no murmur,no extra heart sounds GI: abdomen soft, normal BS, no masses, no tenderness, no hepatomegaly, no splenomegaly EXTREMITIES: able to move RUE and BLE; limited movement of LUE due to surgery; LUE on sling PSYCHIATRIC: the patient is alert & oriented to person, affect & behavior appropriate  LABS/RADIOLOGY: Labs reviewed: 09/14/14  stool culture shows no salmonella, shigella, Campylobacter, nursing area, or no Escherichia coli isolated 09/10/14  WBC 6.2 hemoglobin 9.7 hematocrit 30.0 platelet 391 09/02/14  WBC 6.0 hemoglobin 8.9 hematocrit 37.5 MCV 92.6 platelet 250 sodium  139 potassium 4.5 glucose 91 BUN 25 creatinine 0.89 total bilirubin 0.4 alkaline phosphatase 61 SGOT 13 SGPT <8 total protein 5.1 albumin 2.8 calcium 9.0 Basic Metabolic Panel:  Recent Labs  08/18/14 1554 09/02/14 10/30/14 1733  NA 139 138 140  K 4.2 4.5 4.6  CL 99*  --  104  CO2 30  --  29  GLUCOSE 107*  --  93  BUN 34* 25* 32*  CREATININE 1.23* 0.9 1.16*  CALCIUM 9.7  --  9.3   Liver Function Tests:  Recent Labs  08/18/14 1554 10/30/14 1733  AST 22 19  ALT 14  11*  ALKPHOS 76 92  BILITOT 0.6 0.3  PROT 6.8 7.0  ALBUMIN 3.8 3.7    CBC:  Recent Labs  08/18/14 1554 09/02/14 10/30/14 1733  WBC 5.7 6.0 7.7  NEUTROABS 3.9  --  5.8  HGB 12.9 8.9* 11.8*  HCT 39.0 28* 36.5  MCV 95.4  --  93.8  PLT 222 250 229    No results found.  ASSESSMENT/PLAN:  Left rotator cuff tear arthropathy S/P reverse left shoulder arthroplasty - for home health PT and OT; follow-up with Dr. Justice Britain, orthopedic surgeon ; continue Percocet 5/325 mg 1-2 tabs by mouth every 4 hours when necessary and start oxycodone 5 mg take 1/2 tab = 2.5 mg by mouth every 6 a.m., 2 PM and 10 PM for pain PRN; Valium 5 mg 1/2 tab- 1 tab by mouth every 6 hours when necessary for muscle spasm  Hypertension - well controlled; continue Diovan 80 mg 1 tab by mouth daily at bedtime and recently started on amlodipine 5 mg 1 tab by mouth daily  Chronic diarrhea - continue Lomotil to 2.5-0.025 mg take 4 tabs by mouth 3 times a day before meals and paregoric 2 mg/5 mL by mouth 3 times a day before meals  GERD - continue Pepcid complete 10-8 100-165 mg 1 tab by mouth daily when necessary and Robinul 1 mg by mouth 3 times a day before meals  Neuropathy - continue lidocaine 5% 1 patch transdermally daily when necessary and Salonpas 1-61% gel apply 1 application topically daily when necessary  Protein calorie malnutrition, severe - albumin 2.8; continue supplementation      I have filled out patient's discharge paperwork and written prescriptions.  Patient will receive home health PT and OT.  Total discharge time: Less than 30 minutes  Discharge time involved coordination of the discharge process with Education officer, museum, nursing staff and therapy department. Medical justification for home health services verified.     [facility], NP [employer] [phone]

## 2015-07-05 ENCOUNTER — Other Ambulatory Visit: Payer: Self-pay | Admitting: Geriatric Medicine

## 2015-07-05 ENCOUNTER — Ambulatory Visit
Admission: RE | Admit: 2015-07-05 | Discharge: 2015-07-05 | Disposition: A | Discharge status: Home / Self Care | Payer: Commercial Managed Care - HMO | Source: Ambulatory Visit | Attending: Geriatric Medicine | Admitting: Geriatric Medicine

## 2015-07-05 DIAGNOSIS — M79672 Pain in left foot: Secondary | ICD-10-CM

## 2015-10-22 ENCOUNTER — Other Ambulatory Visit: Payer: Self-pay | Admitting: Geriatric Medicine

## 2015-10-22 DIAGNOSIS — E042 Nontoxic multinodular goiter: Secondary | ICD-10-CM

## 2015-10-27 ENCOUNTER — Ambulatory Visit
Admission: RE | Admit: 2015-10-27 | Discharge: 2015-10-27 | Disposition: A | Discharge status: Home / Self Care | Payer: Commercial Managed Care - HMO | Source: Ambulatory Visit | Attending: Geriatric Medicine | Admitting: Geriatric Medicine

## 2015-10-27 DIAGNOSIS — E042 Nontoxic multinodular goiter: Secondary | ICD-10-CM

## 2015-10-28 ENCOUNTER — Other Ambulatory Visit: Payer: Commercial Managed Care - HMO

## 2015-11-02 ENCOUNTER — Other Ambulatory Visit: Payer: Commercial Managed Care - HMO

## 2015-11-03 ENCOUNTER — Other Ambulatory Visit: Payer: Self-pay | Admitting: Geriatric Medicine

## 2015-11-03 DIAGNOSIS — E041 Nontoxic single thyroid nodule: Secondary | ICD-10-CM

## 2015-11-05 ENCOUNTER — Other Ambulatory Visit: Payer: Self-pay | Admitting: Geriatric Medicine

## 2015-11-05 ENCOUNTER — Ambulatory Visit
Admission: RE | Admit: 2015-11-05 | Discharge: 2015-11-05 | Disposition: A | Discharge status: Home / Self Care | Payer: Commercial Managed Care - HMO | Source: Ambulatory Visit | Attending: Geriatric Medicine | Admitting: Geriatric Medicine

## 2015-11-05 DIAGNOSIS — M25552 Pain in left hip: Secondary | ICD-10-CM

## 2015-11-09 ENCOUNTER — Other Ambulatory Visit (HOSPITAL_COMMUNITY)
Admission: RE | Admit: 2015-11-09 | Discharge: 2015-11-09 | Disposition: A | Discharge status: Home / Self Care | Payer: Commercial Managed Care - HMO | Source: Ambulatory Visit | Attending: General Surgery | Admitting: General Surgery

## 2015-11-09 ENCOUNTER — Ambulatory Visit
Admission: RE | Admit: 2015-11-09 | Discharge: 2015-11-09 | Disposition: A | Discharge status: Home / Self Care | Payer: Commercial Managed Care - HMO | Source: Ambulatory Visit | Attending: Geriatric Medicine | Admitting: Geriatric Medicine

## 2015-11-09 DIAGNOSIS — E041 Nontoxic single thyroid nodule: Secondary | ICD-10-CM

## 2015-11-12 ENCOUNTER — Other Ambulatory Visit: Payer: Self-pay | Admitting: Geriatric Medicine

## 2015-11-12 DIAGNOSIS — Z1231 Encounter for screening mammogram for malignant neoplasm of breast: Secondary | ICD-10-CM

## 2015-11-17 ENCOUNTER — Other Ambulatory Visit: Payer: Self-pay | Admitting: Orthopedic Surgery

## 2015-11-17 DIAGNOSIS — M7612 Psoas tendinitis, left hip: Secondary | ICD-10-CM

## 2015-11-19 ENCOUNTER — Ambulatory Visit
Admission: RE | Admit: 2015-11-19 | Discharge: 2015-11-19 | Disposition: A | Discharge status: Home / Self Care | Payer: Commercial Managed Care - HMO | Source: Ambulatory Visit | Attending: Geriatric Medicine | Admitting: Geriatric Medicine

## 2015-11-19 DIAGNOSIS — Z1231 Encounter for screening mammogram for malignant neoplasm of breast: Secondary | ICD-10-CM

## 2015-11-24 ENCOUNTER — Other Ambulatory Visit: Payer: Commercial Managed Care - HMO

## 2015-11-30 ENCOUNTER — Ambulatory Visit
Admission: RE | Admit: 2015-11-30 | Discharge: 2015-11-30 | Disposition: A | Discharge status: Home / Self Care | Payer: Commercial Managed Care - HMO | Source: Ambulatory Visit | Attending: Orthopedic Surgery | Admitting: Orthopedic Surgery

## 2015-11-30 DIAGNOSIS — M7612 Psoas tendinitis, left hip: Secondary | ICD-10-CM

## 2015-11-30 MED ORDER — IOPAMIDOL (ISOVUE-M 200) INJECTION 41%
1.0000 mL | Freq: Once | INTRAMUSCULAR | Status: AC
  Administered 2015-11-30: 1 mL

## 2015-11-30 MED ORDER — METHYLPREDNISOLONE ACETATE 40 MG/ML INJ SUSP (RADIOLOG
120.0000 mg | Freq: Once | INTRAMUSCULAR | Status: AC
  Administered 2015-11-30: 120 mg via INTRAMUSCULAR

## 2016-01-01 DIAGNOSIS — M19079 Primary osteoarthritis, unspecified ankle and foot: Secondary | ICD-10-CM | POA: Insufficient documentation

## 2016-01-01 DIAGNOSIS — M19049 Primary osteoarthritis, unspecified hand: Secondary | ICD-10-CM | POA: Insufficient documentation

## 2016-01-01 DIAGNOSIS — M1990 Unspecified osteoarthritis, unspecified site: Secondary | ICD-10-CM | POA: Insufficient documentation

## 2016-01-02 NOTE — Progress Notes (Deleted)
*IMAGE* Office Visit Note  Patient: [name]             Date of Birth: 1930/08/29           MRN: [id number]             PCP: Mathews Argyle, MD Referring: Lajean Manes, MD Visit Date: 01/04/2016 Occupation:@GUAROCC @    Subjective:  No chief complaint on file.   History of Present Illness: RHEN MARCK is a 82 y.o. female   On last visit the patient's history of present illness was: Patient states that she has been having left foot swelling for about 10 days now.  She saw Dr. Coralyn Mark who recommended that she come to see [place] after the patient's x-ray was negative.  She states that there were no broken bones.  Patient also gives a history of peripheral neuropathy.  It was treated by Lyrica, but patient could not tolerate the medication.  Patient also states that she had a reverse left shoulder joint surgery by Dr. Onnie Graham.  Patient states that ever since the surgery was done, she is not doing well with her left shoulder joint.  She is also complaining of UTI symptoms, but right now, she is taking cephalexin for that.  She is also seeing a GI doctor who is treating her currently.  She is having a lot of diarrhea and she states it is because she does not have any colon.     On last visit the patient's impression and plan were:  1.  Left wrist joint pain with art classes.  There is popping sound with range of motion, but no hand strength. 2.  Left foot pain at the 2nd toe.  Patient's PCP advised patient to come see [place].  Currently on exam she has redness and warmth.  This is consistent with cellulitis versus gout.  Patient states that her family doctor did the uric acid test and they were suppose to send it to [place], but we do not have anything in the patient's chart at this time.  Dr. Estanislado Pandy saw the patient and she felt that this was more consistent with cellulitis and there is no tenderness on examination. 3.  Left shoulder joint pain despite reverse surgery done by Dr. Onnie Graham.  She  will follow up with Dr. Onnie Graham.  4.  OA of the hands.  5.  OA of the feet.  Ongoing discomfort. 6.  History of inflammatory arthritis, but she was given this diagnosis, according to the patient, by Dr. Lenna Gilford and was treated with an immunosuppressant, but we are not giving the patient any immunosuppressant at this time because we do not feel there is any active inflammatory arthritis going on. 7.  Advised patient to follow up with PCP. 8.  Patient has Keflex and she will start taking it and follow up with PCP. 9.  If symptoms worsen, go the ER.  10.  Dr. Estanislado Pandy wrote the following prescription, Keflex 500 mg 1 p.o. t.i.d., #10-day supply, which is 30 pills. 11.  Return to clinic in 6 months. 12.  Consultation time with patient was 30 minutes with 50% of the time spent on discussing efficacy of treatment plan.     Patient was last seen 07/12/2015. Today patient states:***   Activities of Daily Living:  Patient reports morning stiffness for 15 minutes.   Patient Denies nocturnal pain.  Difficulty dressing/grooming: Denies Difficulty climbing stairs: Reports Difficulty getting out of chair: Reports Difficulty using hands for taps, buttons,  cutlery, and/or writing: Reports   No Rheumatology ROS completed.   PMFS History:  Patient Active Problem List   Diagnosis Date Noted  . Osteoarthritis of hand 01/01/2016  . 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 Unspecified osteoarthritis, unspecified site 01/01/2016  . S/p reverse total shoulder arthroplasty 08/27/2014  . Chronic diarrhea 12/22/2011    Past Medical History:  Diagnosis Date  . Anemia   . Arthritis   . Cancer    melanoma  . Chronic diarrhea   . Complication of anesthesia    hallucinations in the past, last surgery was OK anesth.   Marland Kitchen COPD (chronic obstructive pulmonary disease)   . 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  Marland Kitchen GERD (gastroesophageal reflux disease)   . Goiter   . Headache(784.0)     sinus  . History of blood transfusion 2006   due to colon bleeding   . Hypertension   . Neuromuscular disorder    peripheral neuropathy - both feet   . Neuropathy   . Osteoporosis   . Shortness of breath dyspnea   . UTI (lower urinary tract infection)    start Tx today /w probiotic- 08/18/2014     Family History  Problem Relation Age of Onset  . Heart attack Mother   . Stroke Father    Past Surgical History:  Procedure Laterality Date  . ABDOMINAL HYSTERECTOMY    . APPENDECTOMY    . BALLOON DILATION  04/19/2011   Procedure: BALLOON DILATION;  Surgeon: Landry Dyke, MD;  Location: [facility];  Service: Endoscopy;  Laterality: N/A;  . BLADDER SURGERY    . COLON SURGERY  2006   Dr. Zella Richer- due to diverticulitis  . ESOPHAGOGASTRODUODENOSCOPY  04/19/2011   Procedure: ESOPHAGOGASTRODUODENOSCOPY (EGD);  Surgeon: Landry Dyke, MD;  Location: [facility];  Service: Endoscopy;  Laterality: N/A;  mac   . hip replacement-bilateral    . JOINT REPLACEMENT    . REVERSE SHOULDER ARTHROPLASTY Left 08/27/2014   Procedure: REVERSE LEFT SHOULDER ARTHROPLASTY;  Surgeon: Justice Britain, MD;  Location: [place];  Service: Orthopedics;  Laterality: Left;  . TOTAL KNEE ARTHROPLASTY Bilateral    Social History   Social History Narrative  . No narrative on file     Objective: Vital Signs: There were no vitals taken for this visit.   Physical Exam   Musculoskeletal Exam:  Decreased range of motion of right shoulder joint at 20 of abduction Decreased range of motion of left shoulder joint at 80 of abduction  Decreased grip strength bilaterally  For myalgias points are all absent ***  CDAI Exam: No CDAI exam completed.  No synovitis on examination  Investigation: No additional findings.   Imaging: No results found.  Speciality Comments: No specialty comments available.    Procedures:  No procedures performed Allergies: Hydromorphone; Lisinopril; Mirtazapine;  Nitrofuran derivatives; and Septra [bactrim]   Assessment / Plan: Visit Diagnoses: Primary osteoarthritis of both hands  Primary osteoarthritis of both feet  Primary osteoarthritis involving multiple joints   History of bilateral total hip replacement History of bilateral total knee replacement  Orders: No orders of the defined types were placed in this encounter.  No orders of the defined types were placed in this encounter.   Face-to-face time spent with patient was 30 minutes. 50% of time was spent in counseling and coordination of care.  Follow-Up Instructions: No Follow-up on file.

## 2016-01-04 ENCOUNTER — Ambulatory Visit: Payer: Medicare Other | Admitting: Rheumatology

## 2016-06-05 ENCOUNTER — Ambulatory Visit
Admission: RE | Admit: 2016-06-05 | Discharge: 2016-06-05 | Disposition: A | Discharge status: Home / Self Care | Payer: Medicare Other | Source: Ambulatory Visit | Attending: Nurse Practitioner | Admitting: Nurse Practitioner

## 2016-06-05 ENCOUNTER — Other Ambulatory Visit: Payer: Self-pay | Admitting: Nurse Practitioner

## 2016-06-05 DIAGNOSIS — W19XXXA Unspecified fall, initial encounter: Secondary | ICD-10-CM

## 2016-12-12 ENCOUNTER — Ambulatory Visit
Admission: RE | Admit: 2016-12-12 | Discharge: 2016-12-12 | Disposition: A | Discharge status: Home / Self Care | Payer: Medicare Other | Source: Ambulatory Visit | Attending: Nurse Practitioner | Admitting: Nurse Practitioner

## 2016-12-12 ENCOUNTER — Other Ambulatory Visit: Payer: Self-pay | Admitting: Nurse Practitioner

## 2016-12-12 DIAGNOSIS — S0993XA Unspecified injury of face, initial encounter: Secondary | ICD-10-CM

## 2016-12-13 ENCOUNTER — Other Ambulatory Visit: Payer: Self-pay | Admitting: Geriatric Medicine

## 2016-12-13 ENCOUNTER — Other Ambulatory Visit: Payer: Self-pay | Admitting: Nurse Practitioner

## 2016-12-13 DIAGNOSIS — S0990XA Unspecified injury of head, initial encounter: Secondary | ICD-10-CM

## 2016-12-14 ENCOUNTER — Ambulatory Visit
Admission: RE | Admit: 2016-12-14 | Discharge: 2016-12-14 | Disposition: A | Discharge status: Home / Self Care | Payer: Medicare Other | Source: Ambulatory Visit | Attending: Nurse Practitioner | Admitting: Nurse Practitioner

## 2016-12-14 DIAGNOSIS — S0990XA Unspecified injury of head, initial encounter: Secondary | ICD-10-CM

## 2017-04-18 ENCOUNTER — Observation Stay (HOSPITAL_COMMUNITY)
Admission: EM | Admit: 2017-04-18 | Discharge: 2017-04-20 | Disposition: A | Discharge status: Skilled Nursing Facility | Payer: Medicare Other | Attending: Internal Medicine | Admitting: Internal Medicine

## 2017-04-18 ENCOUNTER — Emergency Department (HOSPITAL_COMMUNITY): Payer: Medicare Other

## 2017-04-18 ENCOUNTER — Other Ambulatory Visit: Payer: Self-pay

## 2017-04-18 ENCOUNTER — Encounter (HOSPITAL_COMMUNITY): Payer: Self-pay | Admitting: Obstetrics and Gynecology

## 2017-04-18 DIAGNOSIS — K529 Noninfective gastroenteritis and colitis, unspecified: Secondary | ICD-10-CM | POA: Diagnosis present

## 2017-04-18 DIAGNOSIS — S73005A Unspecified dislocation of left hip, initial encounter: Secondary | ICD-10-CM | POA: Diagnosis not present

## 2017-04-18 DIAGNOSIS — N183 Chronic kidney disease, stage 3 (moderate): Secondary | ICD-10-CM | POA: Diagnosis not present

## 2017-04-18 DIAGNOSIS — M19049 Primary osteoarthritis, unspecified hand: Secondary | ICD-10-CM | POA: Diagnosis not present

## 2017-04-18 DIAGNOSIS — Y92019 Unspecified place in single-family (private) house as the place of occurrence of the external cause: Secondary | ICD-10-CM | POA: Insufficient documentation

## 2017-04-18 DIAGNOSIS — S0101XA Laceration without foreign body of scalp, initial encounter: Secondary | ICD-10-CM | POA: Insufficient documentation

## 2017-04-18 DIAGNOSIS — Z96643 Presence of artificial hip joint, bilateral: Secondary | ICD-10-CM | POA: Insufficient documentation

## 2017-04-18 DIAGNOSIS — I10 Essential (primary) hypertension: Secondary | ICD-10-CM

## 2017-04-18 DIAGNOSIS — S73004A Unspecified dislocation of right hip, initial encounter: Secondary | ICD-10-CM | POA: Insufficient documentation

## 2017-04-18 DIAGNOSIS — Z885 Allergy status to narcotic agent status: Secondary | ICD-10-CM | POA: Insufficient documentation

## 2017-04-18 DIAGNOSIS — I129 Hypertensive chronic kidney disease with stage 1 through stage 4 chronic kidney disease, or unspecified chronic kidney disease: Secondary | ICD-10-CM | POA: Insufficient documentation

## 2017-04-18 DIAGNOSIS — R778 Other specified abnormalities of plasma proteins: Secondary | ICD-10-CM

## 2017-04-18 DIAGNOSIS — Z96653 Presence of artificial knee joint, bilateral: Secondary | ICD-10-CM | POA: Diagnosis not present

## 2017-04-18 DIAGNOSIS — M19079 Primary osteoarthritis, unspecified ankle and foot: Secondary | ICD-10-CM | POA: Diagnosis present

## 2017-04-18 DIAGNOSIS — Z87891 Personal history of nicotine dependence: Secondary | ICD-10-CM | POA: Insufficient documentation

## 2017-04-18 DIAGNOSIS — R7989 Other specified abnormal findings of blood chemistry: Secondary | ICD-10-CM | POA: Diagnosis not present

## 2017-04-18 DIAGNOSIS — Y92009 Unspecified place in unspecified non-institutional (private) residence as the place of occurrence of the external cause: Secondary | ICD-10-CM

## 2017-04-18 DIAGNOSIS — R52 Pain, unspecified: Secondary | ICD-10-CM | POA: Diagnosis present

## 2017-04-18 DIAGNOSIS — Z881 Allergy status to other antibiotic agents status: Secondary | ICD-10-CM | POA: Diagnosis not present

## 2017-04-18 DIAGNOSIS — W19XXXA Unspecified fall, initial encounter: Secondary | ICD-10-CM | POA: Diagnosis not present

## 2017-04-18 DIAGNOSIS — Z888 Allergy status to other drugs, medicaments and biological substances status: Secondary | ICD-10-CM | POA: Insufficient documentation

## 2017-04-18 DIAGNOSIS — M25552 Pain in left hip: Secondary | ICD-10-CM | POA: Diagnosis present

## 2017-04-18 LAB — CBC WITH DIFFERENTIAL/PLATELET
BASOS ABS: 0 10*3/uL (ref 0.0–0.1)
BASOS PCT: 0 %
EOS ABS: 0 10*3/uL (ref 0.0–0.7)
EOS PCT: 0 %
HCT: 36.6 % (ref 36.0–46.0)
Hemoglobin: 11.7 g/dL — ABNORMAL LOW (ref 12.0–15.0)
LYMPHS PCT: 4 %
Lymphs Abs: 0.6 10*3/uL — ABNORMAL LOW (ref 0.7–4.0)
MCH: 30.3 pg (ref 26.0–34.0)
MCHC: 32 g/dL (ref 30.0–36.0)
MCV: 94.8 fL (ref 78.0–100.0)
Monocytes Absolute: 1.1 10*3/uL — ABNORMAL HIGH (ref 0.1–1.0)
Monocytes Relative: 7 %
Neutro Abs: 14.3 10*3/uL — ABNORMAL HIGH (ref 1.7–7.7)
Neutrophils Relative %: 89 %
PLATELETS: 267 10*3/uL (ref 150–400)
RBC: 3.86 MIL/uL — AB (ref 3.87–5.11)
RDW: 14.5 % (ref 11.5–15.5)
WBC: 16 10*3/uL — AB (ref 4.0–10.5)

## 2017-04-18 LAB — I-STAT TROPONIN, ED: TROPONIN I, POC: 0.02 ng/mL (ref 0.00–0.08)

## 2017-04-18 LAB — COMPREHENSIVE METABOLIC PANEL
ALK PHOS: 65 U/L (ref 38–126)
ALT: 26 U/L (ref 14–54)
ANION GAP: 11 (ref 5–15)
AST: 46 U/L — AB (ref 15–41)
Albumin: 3.7 g/dL (ref 3.5–5.0)
BILIRUBIN TOTAL: 1.1 mg/dL (ref 0.3–1.2)
BUN: 30 mg/dL — AB (ref 6–20)
CALCIUM: 9.8 mg/dL (ref 8.9–10.3)
CO2: 23 mmol/L (ref 22–32)
Chloride: 105 mmol/L (ref 101–111)
Creatinine, Ser: 1.24 mg/dL — ABNORMAL HIGH (ref 0.44–1.00)
GFR calc Af Amer: 45 mL/min — ABNORMAL LOW (ref >60)
GFR, EST NON AFRICAN AMERICAN: 39 mL/min — AB (ref >60)
Glucose, Bld: 139 mg/dL — ABNORMAL HIGH (ref 65–99)
POTASSIUM: 4.8 mmol/L (ref 3.5–5.1)
Sodium: 139 mmol/L (ref 135–145)
TOTAL PROTEIN: 6.8 g/dL (ref 6.5–8.1)

## 2017-04-18 LAB — ETHANOL

## 2017-04-18 MED ORDER — MORPHINE SULFATE (PF) 2 MG/ML IV SOLN
2.0000 mg | Freq: Once | INTRAVENOUS | Status: AC
  Administered 2017-04-18: 2 mg via INTRAVENOUS
  Filled 2017-04-18: qty 1

## 2017-04-18 MED ORDER — PROPOFOL 10 MG/ML IV BOLUS
0.5000 mg/kg | Freq: Once | INTRAVENOUS | Status: AC
  Administered 2017-04-18: 26.1 mg via INTRAVENOUS
  Filled 2017-04-18: qty 20

## 2017-04-18 MED ORDER — PROPOFOL 10 MG/ML IV BOLUS
INTRAVENOUS | Status: AC | PRN
  Administered 2017-04-18: 20 mg via INTRAVENOUS
  Administered 2017-04-18: 5 mg via INTRAVENOUS

## 2017-04-18 MED ORDER — PROPOFOL 10 MG/ML IV BOLUS
INTRAVENOUS | Status: AC | PRN
  Administered 2017-04-18 (×2): 10 mg via INTRAVENOUS

## 2017-04-18 MED ORDER — ONDANSETRON HCL 4 MG/2ML IJ SOLN
4.0000 mg | Freq: Once | INTRAMUSCULAR | Status: AC
  Administered 2017-04-18: 4 mg via INTRAVENOUS
  Filled 2017-04-18: qty 2

## 2017-04-18 NOTE — ED Notes (Signed)
RN attempted to ask pt if she could call pt's son. Pt replied she was not on "speaking terms" with her son at this time.

## 2017-04-18 NOTE — ED Notes (Signed)
Difficulty repeating BP due to PA attempting to pull hip back into place

## 2017-04-18 NOTE — Sedation Documentation (Signed)
Reduction of left hip completed

## 2017-04-18 NOTE — ED Notes (Signed)
Bed: MY11 Expected date:  Expected time:  Means of arrival:  Comments: 84 yo F  Fall, head injury, hip deformity

## 2017-04-18 NOTE — ED Provider Notes (Signed)
[facility] DEPT Provider Note   CSN: [id number] Arrival date & time: 04/18/17  2011     History   Chief Complaint Chief Complaint  Patient presents with  . Fall    HPI [name] is a 84 y.o. female.  HPI   SHEALEIGH DUNSTAN is a 84 y.o. female, with a history of COPD, depression, osteoporosis, bilateral hip replacements, and HTN, presenting to the ED with a fall that occurred sometime prior to arrival. States her hips buckled while taking off her stockings, causing her to fall in her bedroom.  Patient also states that she fell down the steps to the basement.  Unsure LOC and unsure head injury.  Pain is 10/10, sharp, bilateral hips, radiating distally, worse with movement.   Patient underwent oral surgery today by Dr. Mina Marble, dentist, to be fitted for a partial.   Per EMS, patient lives alone and is in an unsafe living environment.  She has stacks of objects throughout the house and it is very difficult to move safely through the home.  Denies chest pain, shortness of breath, fever/chills, recent illness, syncope, dizziness, headache, acute numbness, weakness, or any other complaints.  Past Medical History:  Diagnosis Date  . Anemia   . Arthritis   . Cancer ([place])    melanoma  . Chronic diarrhea   . Complication of anesthesia    hallucinations in the past, last surgery was OK anesth.   Marland Kitchen COPD (chronic obstructive pulmonary disease) ([place])   . 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  Marland Kitchen GERD (gastroesophageal reflux disease)   . Goiter   . Headache(784.0)    sinus  . History of blood transfusion 2006   due to colon bleeding   . Hypertension   . Neuromuscular disorder ([place])    peripheral neuropathy - both feet   . Neuropathy   . Osteoporosis   . Shortness of breath dyspnea   . UTI (lower urinary tract infection)    start Tx today /w probiotic- 08/18/2014     Patient Active Problem List   Diagnosis Date Noted  .  Osteoarthritis of hand 01/01/2016  . 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 Unspecified osteoarthritis, unspecified site 01/01/2016  . S/p reverse total shoulder arthroplasty 08/27/2014  . Chronic diarrhea 12/22/2011    Past Surgical History:  Procedure Laterality Date  . ABDOMINAL HYSTERECTOMY    . APPENDECTOMY    . BALLOON DILATION  04/19/2011   Procedure: BALLOON DILATION;  Surgeon: Landry Dyke, MD;  Location: [facility];  Service: Endoscopy;  Laterality: N/A;  . BLADDER SURGERY    . COLON SURGERY  2006   Dr. Zella Richer- due to diverticulitis  . ESOPHAGOGASTRODUODENOSCOPY  04/19/2011   Procedure: ESOPHAGOGASTRODUODENOSCOPY (EGD);  Surgeon: Landry Dyke, MD;  Location: [facility];  Service: Endoscopy;  Laterality: N/A;  mac   . hip replacement-bilateral    . JOINT REPLACEMENT    . REVERSE SHOULDER ARTHROPLASTY Left 08/27/2014   Procedure: REVERSE LEFT SHOULDER ARTHROPLASTY;  Surgeon: Justice Britain, MD;  Location: [facility];  Service: Orthopedics;  Laterality: Left;  . TOTAL KNEE ARTHROPLASTY Bilateral     OB History    Gravida Para Term Preterm AB Living             1   SAB TAB Ectopic Multiple Live Births  Home Medications    Prior to Admission medications   Medication Sig Start Date End Date Taking? Authorizing Provider  diphenoxylate-atropine (LOMOTIL) 2.5-0.025 MG per tablet Take 4 tablets by mouth 3 (three) times daily before meals. Patient taking differently: Take 2 tablets by mouth 3 (three) times daily before meals.  10/30/14  Yes Leo Grosser, MD  glycopyrrolate (ROBINUL) 1 MG tablet Take 1 mg by mouth 3 (three) times daily before meals.    Yes [provider]  naproxen sodium (ALEVE) 220 MG tablet Take 220 mg by mouth 2 (two) times daily as needed (pain).   Yes [provider]  oxyCODONE-acetaminophen (PERCOCET) 5-325 MG per tablet Take 1-2 tablets by mouth every 4 (four) hours as needed. Patient not taking: Reported on  10/30/2014 08/28/14   Shuford, Olivia Mackie, PA-C  paregoric 2 MG/5ML solution Take 5 mLs by mouth 3 (three) times daily before meals.    [provider]    Family History Family History  Problem Relation Age of Onset  . Heart attack Mother   . Stroke Father     Social History Social History   Tobacco Use  . Smoking status: Former Smoker    Last attempt to quit: 02/28/1968    Years since quitting: 49.1  Substance Use Topics  . Alcohol use: Yes    Comment: rarely  . Drug use: No     Allergies   Hydromorphone; Lisinopril; Mirtazapine; Nitrofuran derivatives; and Septra [bactrim]   Review of Systems Review of Systems  Constitutional: Negative for chills and fever.  Respiratory: Negative for shortness of breath.   Cardiovascular: Negative for chest pain.  Gastrointestinal: Negative for abdominal pain, diarrhea, nausea and vomiting.  Musculoskeletal: Positive for arthralgias and neck pain. Negative for back pain.  Skin: Positive for wound.  Neurological: Negative for weakness, numbness and headaches.  All other systems reviewed and are negative.    Physical Exam Updated Vital Signs BP (!) 235/138 (BP Location: Left Arm)   Pulse (!) 102   Temp 98.1 F (36.7 C) (Oral)   Resp 20   Ht _0  (1.651 m)   Wt 52.2 kg (115 lb)   SpO2 97%   BMI 19.14 kg/m   Physical Exam  Constitutional: She is oriented to person, place, and time. She appears well-developed and well-nourished. No distress.  HENT:  Head: Normocephalic.  2 cm laceration to the central occipital region with minor surrounding swelling.  Bleeding controlled. No noted facial wounds, tenderness, or swelling.  Eyes: Conjunctivae and EOM are normal. Pupils are equal, round, and reactive to light.  Neck: Neck supple.  Cardiovascular: Normal rate, regular rhythm, normal heart sounds and intact distal pulses.  Pulmonary/Chest: Effort normal and breath sounds normal. No respiratory distress.  Abdominal: Soft. There  is no tenderness. There is no guarding.  Musculoskeletal: She exhibits no edema.  Lateral deformity noted to the bilateral hips with associated tenderness.  Lymphadenopathy:    She has no cervical adenopathy.  Neurological: She is alert and oriented to person, place, and time.  No acute sensory deficits.  No noted speech deficits. No aphasia. Patient handles oral secretions without difficulty. No noted swallowing defects.  Equal grip strength bilaterally. Strength 5/5 in the upper extremities. Motor function intact in the lower extremities. Cranial nerves III-XII grossly intact.  No facial droop.   Skin: Skin is warm and dry. She is not diaphoretic.     Psychiatric: She has a normal mood and affect. Her behavior is normal.  Nursing  note and vitals reviewed.     ED Treatments / Results  Labs (all labs ordered are listed, but only abnormal results are displayed) Labs Reviewed  COMPREHENSIVE METABOLIC PANEL - Abnormal; Notable for the following components:      Result Value   Glucose, Bld 139 (*)    BUN 30 (*)    Creatinine, Ser 1.24 (*)    AST 46 (*)    GFR calc non Af Amer 39 (*)    GFR calc Af Amer 45 (*)    All other components within normal limits  CBC WITH DIFFERENTIAL/PLATELET - Abnormal; Notable for the following components:   WBC 16.0 (*)    RBC 3.86 (*)    Hemoglobin 11.7 (*)    Neutro Abs 14.3 (*)    Lymphs Abs 0.6 (*)    Monocytes Absolute 1.1 (*)    All other components within normal limits  ETHANOL  RAPID URINE DRUG SCREEN, HOSP PERFORMED  URINALYSIS, ROUTINE W REFLEX MICROSCOPIC  I-STAT TROPONIN, ED    EKG  EKG Interpretation  Date/Time:  Wednesday April 18 2017 21:43:29 EST Ventricular Rate:  101 PR Interval:    QRS Duration: 93 QT Interval:  361 QTC Calculation: 468 R Axis:   -49 Text Interpretation:  Sinus tachycardia Left anterior fascicular block Anteroseptal infarct, age indeterminate No STEMI.  Confirmed by Nanda Quinton [phone]) on  04/18/2017 10:09:50 PM       Radiology Ct Head Wo Contrast  Result Date: 04/18/2017 CLINICAL DATA:  Weakness with several falls sustaining head lacerations. EXAM: CT HEAD WITHOUT CONTRAST CT CERVICAL SPINE WITHOUT CONTRAST TECHNIQUE: Multidetector CT imaging of the head and cervical spine was performed following the standard protocol without intravenous contrast. Multiplanar CT image reconstructions of the cervical spine were also generated. COMPARISON:  12/14/2016 and 06/05/2016 CT head exams. FINDINGS: CT HEAD FINDINGS Brain: Redemonstration of extra-axial hyperdense left frontal mass compatible with a non calcified meningioma. Given slice sampling differences, this mass measures approximately 2.4 x 1.9 cm currently. Slight localized mass effect on the adjacent brain without edema. No intracranial hemorrhage, midline shift or edema. Chronic moderate small vessel ischemic disease and atrophy is otherwise noted. Midline fourth ventricle and basal cisterns. Vascular: No hyperdense vessels. Skull: No skull fracture. Stable mild scalp ossification anterior to the frontal sinus. Sinuses/Orbits: No acute finding. Other: Frontal as well as frontoparietal scalp contusions. The frontoparietal contusion also demonstrates soft tissue lacerations and an associated scalp hematoma. CT CERVICAL SPINE FINDINGS Alignment: Intact craniocervical relationship. Osteoarthritis of the atlantodental interval with joint space narrowing spurring. Maintained cervical lordosis. Skull base and vertebrae: No skull base fracture. No vertebral body fracture. Soft tissues and spinal canal: No prevertebral soft tissue swelling. No visible canal hematoma. Disc levels: Marked cervical spondylosis with degenerative disc disease, mild at C2-3 and moderate-to-marked from C3 through T1. Small posterior marginal osteophytes and uncovertebral joint osteoarthritis with uncinate spurring is noted from C3 through T1. Associated bilateral facet  arthropathy is identified. These contribute to bilateral neural foraminal encroachment greatest at C3-4 on the left. Upper chest: Apical pleuroparenchymal scarring bilaterally. Other: None IMPRESSION: 1. Frontal and frontoparietal scalp contusions. Associated scalp laceration and hematoma associated with the frontoparietal scalp contusion. No underlying skull fracture. 2. Chronic stable left frontal region meningioma with localized mass effect but no associated edema. 3. Chronic moderate small vessel ischemic disease of the brain. No acute intracranial abnormality. 4. Cervical spondylosis without acute cervical spine fracture. Electronically Signed   By: Meredith Leeds.D.  On: 04/18/2017 22:41   Ct Cervical Spine Wo Contrast  Result Date: 04/18/2017 CLINICAL DATA:  Weakness with several falls sustaining head lacerations. EXAM: CT HEAD WITHOUT CONTRAST CT CERVICAL SPINE WITHOUT CONTRAST TECHNIQUE: Multidetector CT imaging of the head and cervical spine was performed following the standard protocol without intravenous contrast. Multiplanar CT image reconstructions of the cervical spine were also generated. COMPARISON:  12/14/2016 and 06/05/2016 CT head exams. FINDINGS: CT HEAD FINDINGS Brain: Redemonstration of extra-axial hyperdense left frontal mass compatible with a non calcified meningioma. Given slice sampling differences, this mass measures approximately 2.4 x 1.9 cm currently. Slight localized mass effect on the adjacent brain without edema. No intracranial hemorrhage, midline shift or edema. Chronic moderate small vessel ischemic disease and atrophy is otherwise noted. Midline fourth ventricle and basal cisterns. Vascular: No hyperdense vessels. Skull: No skull fracture. Stable mild scalp ossification anterior to the frontal sinus. Sinuses/Orbits: No acute finding. Other: Frontal as well as frontoparietal scalp contusions. The frontoparietal contusion also demonstrates soft tissue lacerations and an  associated scalp hematoma. CT CERVICAL SPINE FINDINGS Alignment: Intact craniocervical relationship. Osteoarthritis of the atlantodental interval with joint space narrowing spurring. Maintained cervical lordosis. Skull base and vertebrae: No skull base fracture. No vertebral body fracture. Soft tissues and spinal canal: No prevertebral soft tissue swelling. No visible canal hematoma. Disc levels: Marked cervical spondylosis with degenerative disc disease, mild at C2-3 and moderate-to-marked from C3 through T1. Small posterior marginal osteophytes and uncovertebral joint osteoarthritis with uncinate spurring is noted from C3 through T1. Associated bilateral facet arthropathy is identified. These contribute to bilateral neural foraminal encroachment greatest at C3-4 on the left. Upper chest: Apical pleuroparenchymal scarring bilaterally. Other: None IMPRESSION: 1. Frontal and frontoparietal scalp contusions. Associated scalp laceration and hematoma associated with the frontoparietal scalp contusion. No underlying skull fracture. 2. Chronic stable left frontal region meningioma with localized mass effect but no associated edema. 3. Chronic moderate small vessel ischemic disease of the brain. No acute intracranial abnormality. 4. Cervical spondylosis without acute cervical spine fracture. Electronically Signed   By: Ashley Royalty M.D.   On: 04/18/2017 22:41   Dg Pelvis Portable  Result Date: 04/18/2017 CLINICAL DATA:  Bilateral hip reductions. EXAM: PORTABLE PELVIS 1-2 VIEWS COMPARISON:  04/18/2017 pre reduction radiographs. FINDINGS: Reduction of bilateral hip arthroplasties without hardware failure nor fracture. Lower lumbar degenerative disc and facet arthropathy. Bony pelvis appears intact. Surgical anchors project over the pubic symphysis. IMPRESSION: Satisfactory reduction of bilateral hip arthroplasties. No immediate post reduction complications. Electronically Signed   By: Ashley Royalty M.D.   On: 04/18/2017  23:25   Dg Chest Portable 1 View  Result Date: 04/18/2017 CLINICAL DATA:  Fall.  Fall in her bedroom, second fall downstairs. EXAM: PORTABLE CHEST 1 VIEW COMPARISON:  Radiograph 02/17/2014 FINDINGS: Chronic hyperinflation and bronchitic change. Unchanged heart size and mediastinal contours. No consolidation, pleural effusion or pneumothorax. Reverse left shoulder arthroplasty. Chronic change about the right shoulder. No acute osseous abnormalities are seen. IMPRESSION: Chronic hyperinflation and bronchial thickening, imaging findings consistent with COPD. No acute abnormality. Electronically Signed   By: Jeb Levering M.D.   On: 04/18/2017 22:08   Dg Hips Bilat With Pelvis 3-4 Views  Result Date: 04/18/2017 CLINICAL DATA:  Bilateral hip pain after fall. Fall in her bedroom, second fall downstairs. EXAM: DG HIP (WITH OR WITHOUT PELVIS) 3-4V BILAT COMPARISON:  None. FINDINGS: Bilateral hip arthroplasties with posterolateral dislocation of both femoral head components from the acetabular components. No fracture of either hip. The  pubic rami are intact. Sacroiliac joints are congruent. The bones are under mineralized. IMPRESSION: Bilateral total hip arthroplasties with bilateral posterolateral dislocation of the femoral head components. No associated fracture. Electronically Signed   By: Jeb Levering M.D.   On: 04/18/2017 21:02    Procedures Reduction of dislocation Date/Time: 04/18/2017 11:00 PM Performed by: Lorayne Bender, PA-C Authorized by: Lorayne Bender, PA-C  Consent: Verbal consent obtained. Risks and benefits: risks, benefits and alternatives were discussed Consent given by: patient Patient understanding: patient states understanding of the procedure being performed Patient consent: the patient's understanding of the procedure matches consent given Procedure consent: procedure consent matches procedure scheduled Imaging studies: imaging studies available Required items: required blood  products, implants, devices, and special equipment available Patient identity confirmed: verbally with patient, provided demographic data and arm band Time out: Immediately prior to procedure a "time out" was called to verify the correct patient, procedure, equipment, support staff and site/side marked as required. Local anesthesia used: no  Anesthesia: Local anesthesia used: no  Sedation: Patient sedated: yes Sedation type: moderate (conscious) sedation Sedatives: propofol Analgesia: morphine Vitals: Vital signs were monitored during sedation.  Patient tolerance: Patient tolerated the procedure well with no immediate complications Comments: Reduction of left hip dislocation  Reduction of dislocation Date/Time: 04/18/2017 11:20 PM Performed by: Lorayne Bender, PA-C Authorized by: Lorayne Bender, PA-C  Consent: Verbal consent obtained. Risks and benefits: risks, benefits and alternatives were discussed Consent given by: patient Patient understanding: patient states understanding of the procedure being performed Patient consent: the patient's understanding of the procedure matches consent given Procedure consent: procedure consent matches procedure scheduled Imaging studies: imaging studies available Patient identity confirmed: verbally with patient, provided demographic data and arm band Time out: Immediately prior to procedure a "time out" was called to verify the correct patient, procedure, equipment, support staff and site/side marked as required. Local anesthesia used: no  Anesthesia: Local anesthesia used: no  Sedation: Patient sedated: yes Sedation type: moderate (conscious) sedation Sedatives: propofol Analgesia: morphine Vitals: Vital signs were monitored during sedation.  Patient tolerance: Patient tolerated the procedure well with no immediate complications Comments: Reduction of right hip dislocation  .Marland KitchenLaceration Repair Date/Time: 04/18/2017 11:40 PM Performed by:  Lorayne Bender, PA-C Authorized by: Lorayne Bender, PA-C   Consent:    Consent obtained:  Verbal   Consent given by:  Patient   Risks discussed:  Infection, need for additional repair, poor cosmetic result, poor wound healing and pain Anesthesia (see MAR for exact dosages):    Anesthesia method:  None (Performed while patient still under the effects of procedural sedation) Laceration details:    Location:  Scalp   Scalp location:  Occipital   Length (cm):  2 Repair type:    Repair type:  Simple Pre-procedure details:    Preparation:  Patient was prepped and draped in usual sterile fashion and imaging obtained to evaluate for foreign bodies Exploration:    Wound exploration: wound explored through full range of motion   Treatment:    Area cleansed with:  Saline   Amount of cleaning:  Standard   Irrigation solution:  Sterile saline   Irrigation method:  Syringe Skin repair:    Repair method:  Staples   Number of staples:  5 Approximation:    Approximation:  Close Post-procedure details:    Dressing:  Open (no dressing)   Patient tolerance of procedure:  Tolerated well, no immediate complications   (including critical care time)  Medications Ordered in ED Medications  morphine 2 MG/ML injection 2 mg (not administered)  morphine 2 MG/ML injection 2 mg (2 mg Intravenous Given 04/18/17 2149)  ondansetron (ZOFRAN) injection 4 mg (4 mg Intravenous Given 04/18/17 2148)  propofol (DIPRIVAN) 10 mg/mL bolus/IV push 26.1 mg (26.1 mg Intravenous Given 04/18/17 2253)  propofol (DIPRIVAN) 10 mg/mL bolus/IV push (5 mg Intravenous Given 04/18/17 2258)  propofol (DIPRIVAN) 10 mg/mL bolus/IV push (10 mg Intravenous Given 04/18/17 2302)     Initial Impression / Assessment and Plan / ED Course  I have reviewed the triage vital signs and the nursing notes.  Pertinent labs & imaging results that were available during my care of the patient were reviewed by me and considered in my medical decision  making (see chart for details).  Clinical Course as of Apr 19 37  Thu Apr 19, 2017  0010 Spoke with Dr. Tyrell Antonio, hospitalist. Requests we contact orthopedic surgeon.   [SJ]  4128 Spoke with Dr. Veverly Fells, orthopedic surgeon with [facility]. Agrees to consult on the patient.  He will come see the patient in the morning and will also place recommendations for her care.  [SJ]    Clinical Course User Index [SJ] Myli Pae C, PA-C    Patient presents for evaluation following a fall.  Bilateral prosthetic hip dislocations, reduced at the bedside under conscious sedation without immediate complication.  Admission for pain control and orthopedic consult in the morning.   Findings and plan of care discussed with Gara Kroner, MD. Dr. Laverta Baltimore personally evaluated and examined this patient.  Vitals:   04/18/17 2259 04/18/17 2300 04/18/17 2330 04/19/17 0000  BP:  (!) 172/103 (!) 189/98 (!) 162/73  Pulse: (!) 102 97 98 94  Resp: (!) 22 (!) _0 Temp:      TempSrc:      SpO2: 98% 97% 98% 99%  Weight:      Height:         Final Clinical Impressions(s) / ED Diagnoses   Final diagnoses:  Fall, initial encounter  Bilateral hip dislocation, initial encounter [facility])    ED Discharge Orders    None       Layla Maw 04/19/17 0039    Margette Fast, MD 04/19/17 [phone]

## 2017-04-18 NOTE — ED Triage Notes (Signed)
Per EMS: Pt fell in her bedroom and attempted to get to a place to call for help and had a second fall down her staircase.  Pt reports the fall was due to weakness. Pt denies neck and back pain. Pt also denies LOC. Pt has some lacerations.  Pt has had bilateral hip replacements and her left side shows some shortening.   Pt has had 126mcgs of fentanyl.  Pt's BP was elevated.

## 2017-04-18 NOTE — ED Notes (Signed)
Patient transported to CT 

## 2017-04-18 NOTE — Sedation Documentation (Signed)
Reduction of left hip in process

## 2017-04-19 ENCOUNTER — Other Ambulatory Visit: Payer: Self-pay

## 2017-04-19 DIAGNOSIS — Z96643 Presence of artificial hip joint, bilateral: Secondary | ICD-10-CM | POA: Diagnosis not present

## 2017-04-19 DIAGNOSIS — R7989 Other specified abnormal findings of blood chemistry: Secondary | ICD-10-CM

## 2017-04-19 DIAGNOSIS — W19XXXA Unspecified fall, initial encounter: Secondary | ICD-10-CM

## 2017-04-19 DIAGNOSIS — S73004A Unspecified dislocation of right hip, initial encounter: Secondary | ICD-10-CM | POA: Diagnosis not present

## 2017-04-19 DIAGNOSIS — R52 Pain, unspecified: Secondary | ICD-10-CM

## 2017-04-19 DIAGNOSIS — D72829 Elevated white blood cell count, unspecified: Secondary | ICD-10-CM | POA: Insufficient documentation

## 2017-04-19 DIAGNOSIS — S0101XA Laceration without foreign body of scalp, initial encounter: Secondary | ICD-10-CM | POA: Diagnosis not present

## 2017-04-19 DIAGNOSIS — I10 Essential (primary) hypertension: Secondary | ICD-10-CM

## 2017-04-19 DIAGNOSIS — Y92009 Unspecified place in unspecified non-institutional (private) residence as the place of occurrence of the external cause: Secondary | ICD-10-CM

## 2017-04-19 DIAGNOSIS — R778 Other specified abnormalities of plasma proteins: Secondary | ICD-10-CM

## 2017-04-19 DIAGNOSIS — S73005A Unspecified dislocation of left hip, initial encounter: Secondary | ICD-10-CM | POA: Diagnosis not present

## 2017-04-19 LAB — URINALYSIS, ROUTINE W REFLEX MICROSCOPIC
Bilirubin Urine: NEGATIVE
Glucose, UA: 50 mg/dL — AB
Hgb urine dipstick: NEGATIVE
KETONES UR: 5 mg/dL — AB
Nitrite: NEGATIVE
PROTEIN: 30 mg/dL — AB
Specific Gravity, Urine: 1.018 (ref 1.005–1.030)
pH: 5 (ref 5.0–8.0)

## 2017-04-19 LAB — BASIC METABOLIC PANEL
Anion gap: 10 (ref 5–15)
BUN: 29 mg/dL — AB (ref 6–20)
CHLORIDE: 107 mmol/L (ref 101–111)
CO2: 23 mmol/L (ref 22–32)
Calcium: 8.8 mg/dL — ABNORMAL LOW (ref 8.9–10.3)
Creatinine, Ser: 1.14 mg/dL — ABNORMAL HIGH (ref 0.44–1.00)
GFR calc Af Amer: 50 mL/min — ABNORMAL LOW (ref >60)
GFR calc non Af Amer: 43 mL/min — ABNORMAL LOW (ref >60)
GLUCOSE: 121 mg/dL — AB (ref 65–99)
POTASSIUM: 4.2 mmol/L (ref 3.5–5.1)
Sodium: 140 mmol/L (ref 135–145)

## 2017-04-19 LAB — RAPID URINE DRUG SCREEN, HOSP PERFORMED
Amphetamines: NOT DETECTED
Barbiturates: NOT DETECTED
Benzodiazepines: NOT DETECTED
COCAINE: NOT DETECTED
OPIATES: POSITIVE — AB
TETRAHYDROCANNABINOL: NOT DETECTED

## 2017-04-19 LAB — TROPONIN I
Troponin I: 0.09 ng/mL (ref <0.03)
Troponin I: 0.31 ng/mL (ref <0.03)
Troponin I: 0.72 ng/mL (ref <0.03)
Troponin I: 0.89 ng/mL (ref <0.03)

## 2017-04-19 LAB — CBC
HEMATOCRIT: 29.3 % — AB (ref 36.0–46.0)
Hemoglobin: 9.7 g/dL — ABNORMAL LOW (ref 12.0–15.0)
MCH: 31 pg (ref 26.0–34.0)
MCHC: 33.1 g/dL (ref 30.0–36.0)
MCV: 93.6 fL (ref 78.0–100.0)
Platelets: 188 10*3/uL (ref 150–400)
RBC: 3.13 MIL/uL — ABNORMAL LOW (ref 3.87–5.11)
RDW: 14.5 % (ref 11.5–15.5)
WBC: 9 10*3/uL (ref 4.0–10.5)

## 2017-04-19 LAB — CK: CK TOTAL: 351 U/L — AB (ref 38–234)

## 2017-04-19 MED ORDER — NAPROXEN SODIUM 275 MG PO TABS
275.0000 mg | ORAL_TABLET | Freq: Two times a day (BID) | ORAL | Status: DC | PRN
  Filled 2017-04-19: qty 1

## 2017-04-19 MED ORDER — ACETAMINOPHEN 325 MG PO TABS: 650.0000 mg | ORAL_TABLET | Freq: Four times a day (QID) | ORAL | Status: DC | PRN

## 2017-04-19 MED ORDER — ALBUTEROL SULFATE (2.5 MG/3ML) 0.083% IN NEBU: 2.5000 mg | INHALATION_SOLUTION | Freq: Four times a day (QID) | RESPIRATORY_TRACT | Status: DC | PRN

## 2017-04-19 MED ORDER — DIPHENOXYLATE-ATROPINE 2.5-0.025 MG PO TABS: 2.0000 | ORAL_TABLET | Freq: Three times a day (TID) | ORAL | Status: DC | PRN

## 2017-04-19 MED ORDER — ASPIRIN 325 MG PO TABS
325.0000 mg | ORAL_TABLET | Freq: Once | ORAL | Status: AC
  Administered 2017-04-19: 325 mg via ORAL
  Filled 2017-04-19: qty 1

## 2017-04-19 MED ORDER — HYDRALAZINE HCL 20 MG/ML IJ SOLN
10.0000 mg | Freq: Four times a day (QID) | INTRAMUSCULAR | Status: DC | PRN
  Administered 2017-04-19 (×2): 10 mg via INTRAVENOUS
  Filled 2017-04-19 (×2): qty 1

## 2017-04-19 MED ORDER — FENTANYL CITRATE (PF) 100 MCG/2ML IJ SOLN
12.5000 ug | INTRAMUSCULAR | Status: DC | PRN
  Administered 2017-04-19 – 2017-04-20 (×4): 12.5 ug via INTRAVENOUS
  Filled 2017-04-19 (×4): qty 2

## 2017-04-19 MED ORDER — FAMOTIDINE 20 MG PO TABS
20.0000 mg | ORAL_TABLET | Freq: Every day | ORAL | Status: DC
  Administered 2017-04-19 – 2017-04-20 (×2): 20 mg via ORAL
  Filled 2017-04-19 (×2): qty 1

## 2017-04-19 MED ORDER — ONDANSETRON HCL 4 MG/2ML IJ SOLN: 4.0000 mg | Freq: Four times a day (QID) | INTRAMUSCULAR | Status: DC | PRN

## 2017-04-19 MED ORDER — ONDANSETRON HCL 4 MG PO TABS: 4.0000 mg | ORAL_TABLET | Freq: Four times a day (QID) | ORAL | Status: DC | PRN

## 2017-04-19 MED ORDER — HEPARIN SODIUM (PORCINE) 5000 UNIT/ML IJ SOLN
5000.0000 [IU] | Freq: Three times a day (TID) | INTRAMUSCULAR | Status: DC
  Administered 2017-04-19 – 2017-04-20 (×2): 5000 [IU] via SUBCUTANEOUS
  Filled 2017-04-19 (×2): qty 1

## 2017-04-19 MED ORDER — SODIUM CHLORIDE 0.9 % IV SOLN
INTRAVENOUS | Status: DC
  Administered 2017-04-19: 15:00:00 via INTRAVENOUS
  Administered 2017-04-19: 75 mL/h via INTRAVENOUS
  Administered 2017-04-20: 04:00:00 via INTRAVENOUS

## 2017-04-19 MED ORDER — MORPHINE SULFATE (PF) 2 MG/ML IV SOLN: 2.0000 mg | Freq: Once | INTRAVENOUS | Status: DC

## 2017-04-19 MED ORDER — ACETAMINOPHEN 650 MG RE SUPP: 650.0000 mg | Freq: Four times a day (QID) | RECTAL | Status: DC | PRN

## 2017-04-19 NOTE — Progress Notes (Signed)
Pt has bil leg immobilizers on.  Has an order for PAS hose.  May we change that order to foot pumps?

## 2017-04-19 NOTE — Consult Note (Signed)
Reason for Consult:Bilateral total hip dislocations Referring Physician: EDP  [name] is an 84 y.o. female.  HPI: s/p fall down stairs with traumatic dislocation of both of her total hip replacements ([place]us[place]). Patient unable to stand after fall.  Transported by EMS to [facility] ED.  EDP able to relocate both hips without difficulty under conscious sedation.  Past Medical History:  Diagnosis Date  . Anemia   . Arthritis   . Cancer ([place])    melanoma  . Chronic diarrhea   . Complication of anesthesia    hallucinations in the past, last surgery was OK anesth.   Marland Kitchen COPD (chronic obstructive pulmonary disease) ([place])   . 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  Marland Kitchen GERD (gastroesophageal reflux disease)   . Goiter   . Headache(784.0)    sinus  . History of blood transfusion 2006   due to colon bleeding   . Hypertension   . Neuromuscular disorder ([place])    peripheral neuropathy - both feet   . Neuropathy   . Osteoporosis   . Shortness of breath dyspnea   . UTI (lower urinary tract infection)    start Tx today /w probiotic- 08/18/2014     Past Surgical History:  Procedure Laterality Date  . ABDOMINAL HYSTERECTOMY    . APPENDECTOMY    . BALLOON DILATION  04/19/2011   Procedure: BALLOON DILATION;  Surgeon: Landry Dyke, MD;  Location: [facility];  Service: Endoscopy;  Laterality: N/A;  . BLADDER SURGERY    . COLON SURGERY  2006   Dr. Zella Richer- due to diverticulitis  . ESOPHAGOGASTRODUODENOSCOPY  04/19/2011   Procedure: ESOPHAGOGASTRODUODENOSCOPY (EGD);  Surgeon: Landry Dyke, MD;  Location: [facility];  Service: Endoscopy;  Laterality: N/A;  mac   . hip replacement-bilateral    . JOINT REPLACEMENT    . REVERSE SHOULDER ARTHROPLASTY Left 08/27/2014   Procedure: REVERSE LEFT SHOULDER ARTHROPLASTY;  Surgeon: Justice Britain, MD;  Location: [facility];  Service: Orthopedics;  Laterality: Left;  . TOTAL KNEE ARTHROPLASTY Bilateral     Family History  Problem  Relation Age of Onset  . Heart attack Mother   . Stroke Father     Social History:  reports that she quit smoking about 49 years ago. She does not have any smokeless tobacco history on file. She reports that she drinks alcohol. She reports that she does not use drugs.  Allergies:  Allergies  Allergen Reactions  . Hydromorphone     Pt does not remember  . Lisinopril     fatigue  . Mirtazapine     Pt doesn't remember.    . Nitrofuran Derivatives Diarrhea  . Septra [Bactrim]     Pt doesn't remember.     Medications: I have reviewed the patient's current medications.  Results for orders placed or performed during the hospital encounter of 04/18/17 (from the past 48 hour(s))  Comprehensive metabolic panel     Status: Abnormal   Collection Time: 04/18/17  9:44 PM  Result Value Ref Range   Sodium 139 135 - 145 mmol/L   Potassium 4.8 3.5 - 5.1 mmol/L   Chloride 105 101 - 111 mmol/L   CO2 23 22 - 32 mmol/L   Glucose, Bld 139 (H) 65 - 99 mg/dL   BUN 30 (H) 6 - 20 mg/dL   Creatinine, Ser 1.24 (H) 0.44 - 1.00 mg/dL   Calcium 9.8 8.9 - 10.3 mg/dL   Total Protein  6.8 6.5 - 8.1 g/dL   Albumin 3.7 3.5 - 5.0 g/dL   AST 46 (H) 15 - 41 U/L   ALT 26 14 - 54 U/L   Alkaline Phosphatase 65 38 - 126 U/L   Total Bilirubin 1.1 0.3 - 1.2 mg/dL   GFR calc non Af Amer 39 (L) >60 mL/min   GFR calc Af Amer 45 (L) >60 mL/min    Comment: (NOTE) The eGFR has been calculated using the CKD EPI equation. This calculation has not been validated in all clinical situations. eGFR's persistently <60 mL/min signify possible Chronic Kidney Disease.    Anion gap 11 5 - 15    Comment: Performed at [facility], [place] [address] [place] [address], [place], [place] [address]  CBC with Differential     Status: Abnormal   Collection Time: 04/18/17  9:44 PM  Result Value Ref Range   WBC 16.0 (H) 4.0 - 10.5 K/uL   RBC 3.86 (L) 3.87 - 5.11 MIL/uL   Hemoglobin 11.7 (L) 12.0 - 15.0 g/dL   HCT 36.6 36.0 - 46.0 %    MCV 94.8 78.0 - 100.0 fL   MCH 30.3 26.0 - 34.0 pg   MCHC 32.0 30.0 - 36.0 g/dL   RDW 14.5 11.5 - 15.5 %   Platelets 267 150 - 400 K/uL   Neutrophils Relative % 89 %   Neutro Abs 14.3 (H) 1.7 - 7.7 K/uL   Lymphocytes Relative 4 %   Lymphs Abs 0.6 (L) 0.7 - 4.0 K/uL   Monocytes Relative 7 %   Monocytes Absolute 1.1 (H) 0.1 - 1.0 K/uL   Eosinophils Relative 0 %   Eosinophils Absolute 0.0 0.0 - 0.7 K/uL   Basophils Relative 0 %   Basophils Absolute 0.0 0.0 - 0.1 K/uL    Comment: Performed at [facility], [place] [address] [place], [place] [address]  Ethanol     Status: None   Collection Time: 04/18/17  9:44 PM  Result Value Ref Range   Alcohol, Ethyl (B) <10 <10 mg/dL    Comment:        LOWEST DETECTABLE LIMIT FOR SERUM ALCOHOL IS 10 mg/dL FOR MEDICAL PURPOSES ONLY Performed at [facility], [place] [address], [place], [place] [address]   I-stat troponin, ED     Status: None   Collection Time: 04/18/17  9:59 PM  Result Value Ref Range   Troponin i, poc 0.02 0.00 - 0.08 ng/mL   Comment 3            Comment: Due to the release kinetics of cTnI, a negative result within the first hours of the onset of symptoms does not rule out myocardial infarction with certainty. If myocardial infarction is still suspected, repeat the test at appropriate intervals.   Troponin I (q 6hr x 3)     Status: None   Collection Time: 04/18/17 10:01 PM  Result Value Ref Range   Troponin I <0.03 <0.03 ng/mL    Comment: Performed at [facility], [place] [address], [place], [place] [address]  CK     Status: Abnormal   Collection Time: 04/18/17 10:01 PM  Result Value Ref Range   Total CK 351 (H) 38 - 234 U/L    Comment: Performed at [facility], [place] [address] [place], [place] [address]  CBC     Status: Abnormal   Collection Time: 04/19/17  7:20 AM  Result Value Ref Range   WBC  9.0 4.0 - 10.5 K/uL   RBC 3.13 (L) 3.87 - 5.11  MIL/uL   Hemoglobin 9.7 (L) 12.0 - 15.0 g/dL   HCT 29.3 (L) 36.0 - 46.0 %   MCV 93.6 78.0 - 100.0 fL   MCH 31.0 26.0 - 34.0 pg   MCHC 33.1 30.0 - 36.0 g/dL   RDW 14.5 11.5 - 15.5 %   Platelets 188 150 - 400 K/uL    Comment: Performed at [facility], [place] [address] [place], [place] [address]    Ct Head Wo Contrast  Result Date: 04/18/2017 CLINICAL DATA:  Weakness with several falls sustaining head lacerations. EXAM: CT HEAD WITHOUT CONTRAST CT CERVICAL SPINE WITHOUT CONTRAST TECHNIQUE: Multidetector CT imaging of the head and cervical spine was performed following the standard protocol without intravenous contrast. Multiplanar CT image reconstructions of the cervical spine were also generated. COMPARISON:  12/14/2016 and 06/05/2016 CT head exams. FINDINGS: CT HEAD FINDINGS Brain: Redemonstration of extra-axial hyperdense left frontal mass compatible with a non calcified meningioma. Given slice sampling differences, this mass measures approximately 2.4 x 1.9 cm currently. Slight localized mass effect on the adjacent brain without edema. No intracranial hemorrhage, midline shift or edema. Chronic moderate small vessel ischemic disease and atrophy is otherwise noted. Midline fourth ventricle and basal cisterns. Vascular: No hyperdense vessels. Skull: No skull fracture. Stable mild scalp ossification anterior to the frontal sinus. Sinuses/Orbits: No acute finding. Other: Frontal as well as frontoparietal scalp contusions. The frontoparietal contusion also demonstrates soft tissue lacerations and an associated scalp hematoma. CT CERVICAL SPINE FINDINGS Alignment: Intact craniocervical relationship. Osteoarthritis of the atlantodental interval with joint space narrowing spurring. Maintained cervical lordosis. Skull base and vertebrae: No skull base fracture. No vertebral body fracture. Soft tissues and spinal canal: No prevertebral soft tissue swelling. No visible canal hematoma. Disc  levels: Marked cervical spondylosis with degenerative disc disease, mild at C2-3 and moderate-to-marked from C3 through T1. Small posterior marginal osteophytes and uncovertebral joint osteoarthritis with uncinate spurring is noted from C3 through T1. Associated bilateral facet arthropathy is identified. These contribute to bilateral neural foraminal encroachment greatest at C3-4 on the left. Upper chest: Apical pleuroparenchymal scarring bilaterally. Other: None IMPRESSION: 1. Frontal and frontoparietal scalp contusions. Associated scalp laceration and hematoma associated with the frontoparietal scalp contusion. No underlying skull fracture. 2. Chronic stable left frontal region meningioma with localized mass effect but no associated edema. 3. Chronic moderate small vessel ischemic disease of the brain. No acute intracranial abnormality. 4. Cervical spondylosis without acute cervical spine fracture. Electronically Signed   By: Ashley Royalty M.D.   On: 04/18/2017 22:41   Ct Cervical Spine Wo Contrast  Result Date: 04/18/2017 CLINICAL DATA:  Weakness with several falls sustaining head lacerations. EXAM: CT HEAD WITHOUT CONTRAST CT CERVICAL SPINE WITHOUT CONTRAST TECHNIQUE: Multidetector CT imaging of the head and cervical spine was performed following the standard protocol without intravenous contrast. Multiplanar CT image reconstructions of the cervical spine were also generated. COMPARISON:  12/14/2016 and 06/05/2016 CT head exams. FINDINGS: CT HEAD FINDINGS Brain: Redemonstration of extra-axial hyperdense left frontal mass compatible with a non calcified meningioma. Given slice sampling differences, this mass measures approximately 2.4 x 1.9 cm currently. Slight localized mass effect on the adjacent brain without edema. No intracranial hemorrhage, midline shift or edema. Chronic moderate small vessel ischemic disease and atrophy is otherwise noted. Midline fourth ventricle and basal cisterns. Vascular: No  hyperdense vessels. Skull: No skull fracture. Stable mild scalp ossification anterior to the frontal  sinus. Sinuses/Orbits: No acute finding. Other: Frontal as well as frontoparietal scalp contusions. The frontoparietal contusion also demonstrates soft tissue lacerations and an associated scalp hematoma. CT CERVICAL SPINE FINDINGS Alignment: Intact craniocervical relationship. Osteoarthritis of the atlantodental interval with joint space narrowing spurring. Maintained cervical lordosis. Skull base and vertebrae: No skull base fracture. No vertebral body fracture. Soft tissues and spinal canal: No prevertebral soft tissue swelling. No visible canal hematoma. Disc levels: Marked cervical spondylosis with degenerative disc disease, mild at C2-3 and moderate-to-marked from C3 through T1. Small posterior marginal osteophytes and uncovertebral joint osteoarthritis with uncinate spurring is noted from C3 through T1. Associated bilateral facet arthropathy is identified. These contribute to bilateral neural foraminal encroachment greatest at C3-4 on the left. Upper chest: Apical pleuroparenchymal scarring bilaterally. Other: None IMPRESSION: 1. Frontal and frontoparietal scalp contusions. Associated scalp laceration and hematoma associated with the frontoparietal scalp contusion. No underlying skull fracture. 2. Chronic stable left frontal region meningioma with localized mass effect but no associated edema. 3. Chronic moderate small vessel ischemic disease of the brain. No acute intracranial abnormality. 4. Cervical spondylosis without acute cervical spine fracture. Electronically Signed   By: Ashley Royalty M.D.   On: 04/18/2017 22:41   Dg Pelvis Portable  Result Date: 04/18/2017 CLINICAL DATA:  Bilateral hip reductions. EXAM: PORTABLE PELVIS 1-2 VIEWS COMPARISON:  04/18/2017 pre reduction radiographs. FINDINGS: Reduction of bilateral hip arthroplasties without hardware failure nor fracture. Lower lumbar degenerative disc  and facet arthropathy. Bony pelvis appears intact. Surgical anchors project over the pubic symphysis. IMPRESSION: Satisfactory reduction of bilateral hip arthroplasties. No immediate post reduction complications. Electronically Signed   By: Ashley Royalty M.D.   On: 04/18/2017 23:25   Dg Chest Portable 1 View  Result Date: 04/18/2017 CLINICAL DATA:  Fall.  Fall in her bedroom, second fall downstairs. EXAM: PORTABLE CHEST 1 VIEW COMPARISON:  Radiograph 02/17/2014 FINDINGS: Chronic hyperinflation and bronchitic change. Unchanged heart size and mediastinal contours. No consolidation, pleural effusion or pneumothorax. Reverse left shoulder arthroplasty. Chronic change about the right shoulder. No acute osseous abnormalities are seen. IMPRESSION: Chronic hyperinflation and bronchial thickening, imaging findings consistent with COPD. No acute abnormality. Electronically Signed   By: Jeb Levering M.D.   On: 04/18/2017 22:08   Dg Hips Bilat With Pelvis 3-4 Views  Result Date: 04/18/2017 CLINICAL DATA:  Bilateral hip pain after fall. Fall in her bedroom, second fall downstairs. EXAM: DG HIP (WITH OR WITHOUT PELVIS) 3-4V BILAT COMPARISON:  None. FINDINGS: Bilateral hip arthroplasties with posterolateral dislocation of both femoral head components from the acetabular components. No fracture of either hip. The pubic rami are intact. Sacroiliac joints are congruent. The bones are under mineralized. IMPRESSION: Bilateral total hip arthroplasties with bilateral posterolateral dislocation of the femoral head components. No associated fracture. Electronically Signed   By: Jeb Levering M.D.   On: 04/18/2017 21:02    ROS Blood pressure 140/70, pulse (!) 104, temperature 98.4 F (36.9 C), temperature source Oral, resp. rate 16, height _0  (1.651 m), weight 54.8 kg (120 lb 12.8 oz), SpO2 98 %. Physical Exam  Patient resting in bed in no distress. Bilateral shoulders grossly located. Incision on left shoulder with  severe deltoid wasting. No deformity or tenderness to palpation in bilateral UEs Bilateral hips grossly located and no pain with gentle motion. Bilateral LEs NVI distally. Knee immobilizers in place  Assessment/Plan: S/p traumatic dislocation of bilateral prosthetic hips. Will notify Dr Maureen Ralphs and request his assistance with management.  Ok for [employer]  on bilateral LEs with therapy.  May remove KIs with therapy only and practicing posterior hip precautions. Thank you [employer] precautions as you are.  Presley Gora,STEVEN R 04/19/2017, 7:53 AM

## 2017-04-19 NOTE — Progress Notes (Signed)
PT Cancellation Note  Patient Details Name: SANTOSHA JIVIDEN MRN: [id number] DOB: Jun 30, 1930   Cancelled Treatment:    Reason Eval/Treat Not Completed: Medical issues which prohibited therapy(troponin trending up, .09 at 7:20 this morning. Will hold PT. )   Philomena Doheny 04/19/2017, 9:02 AM [phone]

## 2017-04-19 NOTE — Progress Notes (Signed)
Bladder scan prior to voiding was 262ml. Patient was able to void 189ml. repeat bladder scan post void was 143ml. Will continue to monitor patient for urinary retention.

## 2017-04-19 NOTE — Evaluation (Addendum)
Physical Therapy Evaluation Patient Details Name: [name] MRN: [id number] DOB: 25-Dec-1930 Today's Date: 04/19/2017   History of Present Illness  84 y.o. female with h/o s/p reverse L TSA, HTN, neuropathy, COPD, depression, B THA admitted with fall and B hip dislocations, s/p closed reductions. Posterior precautions, WBAT.  Clinical Impression  Pt admitted with above diagnosis. Pt currently with functional limitations due to the deficits listed below (see PT Problem List). Pt ambulated 8' with RW, distance limited by pain in R hip. Instructed pt in posterior precautions. Noted troponin .09 today, per RN EKG was negative, she stated PT could procede with eval.  ST-SNF recommended.  Pt will benefit from skilled PT to increase their independence and safety with mobility to allow discharge to the venue listed below.       Follow Up Recommendations SNF    Equipment Recommendations  None recommended by PT    Recommendations for Other Services       Precautions / Restrictions Precautions Precautions: Posterior Hip Precaution Booklet Issued: Yes (comment) Precaution Comments: signs hung on door, in room, issued to pt; instructed RN and NT on posterior precautions; reports h/o multiple falls in past 1 year Required Braces or Orthoses: Knee Immobilizer - Right;Knee Immobilizer - Left Knee Immobilizer - Right: Other (comment)(B KIs when in bed) Knee Immobilizer - Left: Other (comment) Restrictions Weight Bearing Restrictions: No Other Position/Activity Restrictions: WBAT      Mobility  Bed Mobility Overal bed mobility: Needs Assistance Bed Mobility: Supine to Sit     Supine to sit: Mod assist     General bed mobility comments: assist to raise trunk and pivot hips to edge of bed  Transfers Overall transfer level: Needs assistance Equipment used: Rolling walker (2 wheeled) Transfers: Sit to/from Stand Sit to Stand: Mod assist;From elevated surface         General transfer  comment: assist to power up, VCs hand placement  Ambulation/Gait Ambulation/Gait assistance: Min guard Ambulation Distance (Feet): 8 Feet Assistive device: Rolling walker (2 wheeled) Gait Pattern/deviations: Step-through pattern;Decreased stride length;Decreased step length - right;Decreased step length - left   Gait velocity interpretation: Below normal speed for age/gender General Gait Details: distance limited by R hip pain, fatigue  Stairs            Wheelchair Mobility    Modified Rankin (Stroke Patients Only)       Balance Overall balance assessment: Needs assistance;History of Falls Sitting-balance support: Feet supported;No upper extremity supported Sitting balance-Leahy Scale: Good     Standing balance support: Bilateral upper extremity supported Standing balance-Leahy Scale: Poor Standing balance comment: relies on BUE support                             Pertinent Vitals/Pain Pain Assessment: Faces Faces Pain Scale: Hurts little more Pain Location: R hip with walking Pain Descriptors / Indicators: Sore Pain Intervention(s): Limited activity within patient's tolerance;Monitored during session;Repositioned    Home Living Family/patient expects to be discharged to:: Skilled nursing facility Living Arrangements: Alone     Home Access: Stairs to enter   Entrance Stairs-Number of Steps: 2   Home Equipment: Walker - 2 wheels;Cane - single point      Prior Function Level of Independence: Independent with assistive device(s)         Comments: ambulates with SPC and furniture walks, drives; no local family     Hand Dominance  Extremity/Trunk Assessment   Upper Extremity Assessment Upper Extremity Assessment: Defer to OT evaluation    Lower Extremity Assessment Lower Extremity Assessment: Generalized weakness(knee ext -3/5 B)    Cervical / Trunk Assessment Cervical / Trunk Assessment: Normal  Communication    Communication: No difficulties  Cognition Arousal/Alertness: Awake/alert Behavior During Therapy: WFL for tasks assessed/performed Overall Cognitive Status: Within Functional Limits for tasks assessed                                        General Comments      Exercises     Assessment/Plan    PT Assessment Patient needs continued PT services  PT Problem List Decreased strength;Decreased mobility;Decreased activity tolerance;Decreased balance;Pain       PT Treatment Interventions Gait training;Functional mobility training;Therapeutic activities;Therapeutic exercise;Patient/family education    PT Goals (Current goals can be found in the Care Plan section)  Acute Rehab PT Goals Patient Stated Goal: ST-SNF PT Goal Formulation: With patient Time For Goal Achievement: 05/03/17 Potential to Achieve Goals: Good    Frequency Min 3X/week   Barriers to discharge        Co-evaluation               AM-PAC PT "6 Clicks" Daily Activity  Outcome Measure Difficulty turning over in bed (including adjusting bedclothes, sheets and blankets)?: A Lot Difficulty moving from lying on back to sitting on the side of the bed? : Unable Difficulty sitting down on and standing up from a chair with arms (e.g., wheelchair, bedside commode, etc,.)?: Unable Help needed moving to and from a bed to chair (including a wheelchair)?: A Lot Help needed walking in hospital room?: A Little Help needed climbing 3-5 steps with a railing? : Total 6 Click Score: 10    End of Session Equipment Utilized During Treatment: Gait belt Activity Tolerance: Patient limited by fatigue;Patient limited by pain Patient left: in chair;with call bell/phone within reach;with chair alarm set Nurse Communication: Mobility status PT Visit Diagnosis: Repeated falls (R29.6);Difficulty in walking, not elsewhere classified (R26.2);Pain;Muscle weakness (generalized) (M62.81) Pain - Right/Left: (both) Pain -  part of body: Hip    Time: 1129-1202 PT Time Calculation (min) (ACUTE ONLY): 33 min   Charges:   PT Evaluation $PT Eval Moderate Complexity: 1 Mod PT Treatments $Gait Training: 8-22 mins   PT G Codes:          Philomena Doheny 04/19/2017, 12:31 PM [phone]

## 2017-04-19 NOTE — ED Notes (Signed)
Pt placed on purewick catheter because she states "there would be too much pain to try and get up to pee".

## 2017-04-19 NOTE — Progress Notes (Signed)
Patient up in chair after physical therapy and stated she felt that she had voided. Got patient up to [facility] and she had been incontinent of urine in the chair. Patient had a BM and got back to bed. Bladder scanned patient to see if she was emptying her bladder. Bladder scan still showed about 449ml of urine in the bladder. Performed I&O cath per MD orders at 1500. Emptied 535ml of yellow urine from the bladder. Will continue to monitor urine output and bladder scan as needed and will place a foley for retention per MD orders if the patient retains urine again.

## 2017-04-19 NOTE — H&P (Addendum)
History and Physical    [name] ALP:[id number] DOB: 1930/04/08 DOA: 04/18/2017  PCP: Lajean Manes, MD  Patient coming from: Home   I have personally briefly reviewed patient's old medical records in [place]  Chief Complaint: fall, hip pain.   HPI: [name] is a 84 y.o. female with medical history significant of COPD, HTN not on medications, Bilateral hip replacement, lives alone, who presents after a fall. Per ED records patient report that her hips buckled while taking off her stockings, causing her to fall in the bedroom. She also report falling down step in the basement. patient appears confuse on my evaluation. She report falling down step in her basement. She was not able to stand up, was down for few hours. She had her phone in her pocket then called for help. " She fell in a different position that she usually fall "  She is complaining of bilateral hip pain. She feels uncomfortable in the bed.   ED Course: patient presents after a fall; Cr 1.2, BUN 30, AST 46, WBC 16, Hb 11, troponin 0.02, CT head; Frontal and frontoparietal scalp contusions. Associated scalp laceration and hematoma associated with the frontoparietal scalp contusion. No underlying skull fracture. Chronic stable left frontal region meningioma with localized mass effect but no associated edema. Chronic moderate small vessel ischemic disease of the brain. No acute intracranial abnormality. Cervical spondylosis without acute cervical spine fracture. Pelvis X ray; Bilateral total hip arthroplasties with bilateral posterolateral dislocation of the femoral head components. No associated fracture.  Chest x ray; Chronic hyperinflation and bronchial thickening, imaging findings consistent with COPD. No acute abnormality.  Patient bilateral hip dislocation were reduce in the ED by ED physician. She received propofol. She had 4-5 staple to the laceration in head.   Review of Systems: As per HPI otherwise 10  point review of systems negative.   Past Medical History:  Diagnosis Date  . Anemia   . Arthritis   . Cancer ([place])    melanoma  . Chronic diarrhea   . Complication of anesthesia    hallucinations in the past, last surgery was OK anesth.   Marland Kitchen COPD (chronic obstructive pulmonary disease) ([place])   . 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  Marland Kitchen GERD (gastroesophageal reflux disease)   . Goiter   . Headache(784.0)    sinus  . History of blood transfusion 2006   due to colon bleeding   . Hypertension   . Neuromuscular disorder ([place])    peripheral neuropathy - both feet   . Neuropathy   . Osteoporosis   . Shortness of breath dyspnea   . UTI (lower urinary tract infection)    start Tx today /w probiotic- 08/18/2014     Past Surgical History:  Procedure Laterality Date  . ABDOMINAL HYSTERECTOMY    . APPENDECTOMY    . BALLOON DILATION  04/19/2011   Procedure: BALLOON DILATION;  Surgeon: Landry Dyke, MD;  Location: [facility];  Service: Endoscopy;  Laterality: N/A;  . BLADDER SURGERY    . COLON SURGERY  2006   Dr. Zella Richer- due to diverticulitis  . ESOPHAGOGASTRODUODENOSCOPY  04/19/2011   Procedure: ESOPHAGOGASTRODUODENOSCOPY (EGD);  Surgeon: Landry Dyke, MD;  Location: [facility];  Service: Endoscopy;  Laterality: N/A;  mac   . hip replacement-bilateral    . JOINT REPLACEMENT    . REVERSE SHOULDER ARTHROPLASTY Left 08/27/2014   Procedure: REVERSE LEFT SHOULDER ARTHROPLASTY;  Surgeon: Justice Britain, MD;  Location: [facility];  Service: Orthopedics;  Laterality: Left;  . TOTAL KNEE ARTHROPLASTY Bilateral      reports that she quit smoking about 49 years ago. She does not have any smokeless tobacco history on file. She reports that she drinks alcohol. She reports that she does not use drugs.  Allergies  Allergen Reactions  . Hydromorphone     Pt does not remember  . Lisinopril     fatigue  . Mirtazapine     Pt doesn't remember.    . Nitrofuran  Derivatives Diarrhea  . Septra [Bactrim]     Pt doesn't remember.     Family History  Problem Relation Age of Onset  . Heart attack Mother   . Stroke Father      Prior to Admission medications   Medication Sig Start Date End Date Taking? Authorizing Provider  diphenoxylate-atropine (LOMOTIL) 2.5-0.025 MG per tablet Take 4 tablets by mouth 3 (three) times daily before meals. Patient taking differently: Take 2 tablets by mouth 3 (three) times daily before meals.  10/30/14  Yes Leo Grosser, MD  glycopyrrolate (ROBINUL) 1 MG tablet Take 1 mg by mouth 3 (three) times daily before meals.    Yes [provider]  naproxen sodium (ALEVE) 220 MG tablet Take 220 mg by mouth 2 (two) times daily as needed (pain).   Yes [provider]  oxyCODONE-acetaminophen (PERCOCET) 5-325 MG per tablet Take 1-2 tablets by mouth every 4 (four) hours as needed. Patient not taking: Reported on 10/30/2014 08/28/14   Shuford, Olivia Mackie, PA-C  paregoric 2 MG/5ML solution Take 5 mLs by mouth 3 (three) times daily before meals.    [provider]    Physical Exam: Vitals:   04/18/17 2259 04/18/17 2300 04/18/17 2330 04/19/17 0000  BP:  (!) 172/103 (!) 189/98 (!) 162/73  Pulse: (!) 102 97 98 94  Resp: (!) 22 (!) _0 Temp:      TempSrc:      SpO2: 98% 97% 98% 99%  Weight:      Height:        Constitutional: NAD, calm, comfortable, head with dry blood. 4 L oxygen  Vitals:   04/18/17 2259 04/18/17 2300 04/18/17 2330 04/19/17 0000  BP:  (!) 172/103 (!) 189/98 (!) 162/73  Pulse: (!) 102 97 98 94  Resp: (!) 22 (!) _1 Temp:      TempSrc:      SpO2: 98% 97% 98% 99%  Weight:      Height:       Eyes: PERRL, lids and conjunctivae normal ENMT: Mucous membranes are dry, dry blood in her mouth. Head; 5 staples head.  Neck: normal, supple, no masses, no thyromegaly Respiratory: clear to auscultation bilaterally, no wheezing, no crackles. Normal respiratory effort. No accessory muscle  use.  Cardiovascular: Regular rate and rhythm, no murmurs / rubs / gallops. No extremity edema. 2+ pedal pulses. No carotid bruits.  Abdomen: no tenderness, no masses palpated. No hepatosplenomegaly. Bowel sounds positive.  Musculoskeletal: no clubbing / cyanosis.  Normal muscle tone.  Skin: no rashes, lesions, ulcers. No induration Neurologic:  Confuse, able to raise left upper extremity. Has chronic pain right arm unable to raise arm. Unable to move B/L LE due to pain.      Labs on Admission: I have personally reviewed following labs and imaging studies  CBC: Recent Labs  Lab 04/18/17 2144  WBC 16.0*  NEUTROABS 14.3*  HGB  11.7*  HCT 36.6  MCV 94.8  PLT 845   Basic Metabolic Panel: Recent Labs  Lab 04/18/17 2144  NA 139  K 4.8  CL 105  CO2 23  GLUCOSE 139*  BUN 30*  CREATININE 1.24*  CALCIUM 9.8   GFR: Estimated Creatinine Clearance: 27.8 mL/min (A) (by C-G formula based on SCr of 1.24 mg/dL (H)). Liver Function Tests: Recent Labs  Lab 04/18/17 2144  AST 46*  ALT 26  ALKPHOS 65  BILITOT 1.1  PROT 6.8  ALBUMIN 3.7   No results for input(s): LIPASE, AMYLASE in the last 168 hours. No results for input(s): AMMONIA in the last 168 hours. Coagulation Profile: No results for input(s): INR, PROTIME in the last 168 hours. Cardiac Enzymes: No results for input(s): CKTOTAL, CKMB, CKMBINDEX, TROPONINI in the last 168 hours. BNP (last 3 results) No results for input(s): PROBNP in the last 8760 hours. HbA1C: No results for input(s): HGBA1C in the last 72 hours. CBG: No results for input(s): GLUCAP in the last 168 hours. Lipid Profile: No results for input(s): CHOL, HDL, LDLCALC, TRIG, CHOLHDL, LDLDIRECT in the last 72 hours. Thyroid Function Tests: No results for input(s): TSH, T4TOTAL, FREET4, T3FREE, THYROIDAB in the last 72 hours. Anemia Panel: No results for input(s): VITAMINB12, FOLATE, FERRITIN, TIBC, IRON, RETICCTPCT in the last 72 hours. Urine analysis:     Component Value Date/Time   COLORURINE YELLOW 10/30/2014 1728   APPEARANCEUR TURBID (A) 10/30/2014 1728   LABSPEC 1.021 10/30/2014 1728   PHURINE 6.0 10/30/2014 1728   GLUCOSEU NEGATIVE 10/30/2014 1728   HGBUR LARGE (A) 10/30/2014 1728   BILIRUBINUR NEGATIVE 10/30/2014 1728   KETONESUR NEGATIVE 10/30/2014 1728   PROTEINUR 100 (A) 10/30/2014 1728   UROBILINOGEN 0.2 10/30/2014 1728   NITRITE POSITIVE (A) 10/30/2014 1728   LEUKOCYTESUR LARGE (A) 10/30/2014 1728    Radiological Exams on Admission: Ct Head Wo Contrast  Result Date: 04/18/2017 CLINICAL DATA:  Weakness with several falls sustaining head lacerations. EXAM: CT HEAD WITHOUT CONTRAST CT CERVICAL SPINE WITHOUT CONTRAST TECHNIQUE: Multidetector CT imaging of the head and cervical spine was performed following the standard protocol without intravenous contrast. Multiplanar CT image reconstructions of the cervical spine were also generated. COMPARISON:  12/14/2016 and 06/05/2016 CT head exams. FINDINGS: CT HEAD FINDINGS Brain: Redemonstration of extra-axial hyperdense left frontal mass compatible with a non calcified meningioma. Given slice sampling differences, this mass measures approximately 2.4 x 1.9 cm currently. Slight localized mass effect on the adjacent brain without edema. No intracranial hemorrhage, midline shift or edema. Chronic moderate small vessel ischemic disease and atrophy is otherwise noted. Midline fourth ventricle and basal cisterns. Vascular: No hyperdense vessels. Skull: No skull fracture. Stable mild scalp ossification anterior to the frontal sinus. Sinuses/Orbits: No acute finding. Other: Frontal as well as frontoparietal scalp contusions. The frontoparietal contusion also demonstrates soft tissue lacerations and an associated scalp hematoma. CT CERVICAL SPINE FINDINGS Alignment: Intact craniocervical relationship. Osteoarthritis of the atlantodental interval with joint space narrowing spurring. Maintained cervical  lordosis. Skull base and vertebrae: No skull base fracture. No vertebral body fracture. Soft tissues and spinal canal: No prevertebral soft tissue swelling. No visible canal hematoma. Disc levels: Marked cervical spondylosis with degenerative disc disease, mild at C2-3 and moderate-to-marked from C3 through T1. Small posterior marginal osteophytes and uncovertebral joint osteoarthritis with uncinate spurring is noted from C3 through T1. Associated bilateral facet arthropathy is identified. These contribute to bilateral neural foraminal encroachment greatest at C3-4 on the left. Upper chest: Apical  pleuroparenchymal scarring bilaterally. Other: None IMPRESSION: 1. Frontal and frontoparietal scalp contusions. Associated scalp laceration and hematoma associated with the frontoparietal scalp contusion. No underlying skull fracture. 2. Chronic stable left frontal region meningioma with localized mass effect but no associated edema. 3. Chronic moderate small vessel ischemic disease of the brain. No acute intracranial abnormality. 4. Cervical spondylosis without acute cervical spine fracture. Electronically Signed   By: Ashley Royalty M.D.   On: 04/18/2017 22:41   Ct Cervical Spine Wo Contrast  Result Date: 04/18/2017 CLINICAL DATA:  Weakness with several falls sustaining head lacerations. EXAM: CT HEAD WITHOUT CONTRAST CT CERVICAL SPINE WITHOUT CONTRAST TECHNIQUE: Multidetector CT imaging of the head and cervical spine was performed following the standard protocol without intravenous contrast. Multiplanar CT image reconstructions of the cervical spine were also generated. COMPARISON:  12/14/2016 and 06/05/2016 CT head exams. FINDINGS: CT HEAD FINDINGS Brain: Redemonstration of extra-axial hyperdense left frontal mass compatible with a non calcified meningioma. Given slice sampling differences, this mass measures approximately 2.4 x 1.9 cm currently. Slight localized mass effect on the adjacent brain without edema. No  intracranial hemorrhage, midline shift or edema. Chronic moderate small vessel ischemic disease and atrophy is otherwise noted. Midline fourth ventricle and basal cisterns. Vascular: No hyperdense vessels. Skull: No skull fracture. Stable mild scalp ossification anterior to the frontal sinus. Sinuses/Orbits: No acute finding. Other: Frontal as well as frontoparietal scalp contusions. The frontoparietal contusion also demonstrates soft tissue lacerations and an associated scalp hematoma. CT CERVICAL SPINE FINDINGS Alignment: Intact craniocervical relationship. Osteoarthritis of the atlantodental interval with joint space narrowing spurring. Maintained cervical lordosis. Skull base and vertebrae: No skull base fracture. No vertebral body fracture. Soft tissues and spinal canal: No prevertebral soft tissue swelling. No visible canal hematoma. Disc levels: Marked cervical spondylosis with degenerative disc disease, mild at C2-3 and moderate-to-marked from C3 through T1. Small posterior marginal osteophytes and uncovertebral joint osteoarthritis with uncinate spurring is noted from C3 through T1. Associated bilateral facet arthropathy is identified. These contribute to bilateral neural foraminal encroachment greatest at C3-4 on the left. Upper chest: Apical pleuroparenchymal scarring bilaterally. Other: None IMPRESSION: 1. Frontal and frontoparietal scalp contusions. Associated scalp laceration and hematoma associated with the frontoparietal scalp contusion. No underlying skull fracture. 2. Chronic stable left frontal region meningioma with localized mass effect but no associated edema. 3. Chronic moderate small vessel ischemic disease of the brain. No acute intracranial abnormality. 4. Cervical spondylosis without acute cervical spine fracture. Electronically Signed   By: Ashley Royalty M.D.   On: 04/18/2017 22:41   Dg Pelvis Portable  Result Date: 04/18/2017 CLINICAL DATA:  Bilateral hip reductions. EXAM: PORTABLE  PELVIS 1-2 VIEWS COMPARISON:  04/18/2017 pre reduction radiographs. FINDINGS: Reduction of bilateral hip arthroplasties without hardware failure nor fracture. Lower lumbar degenerative disc and facet arthropathy. Bony pelvis appears intact. Surgical anchors project over the pubic symphysis. IMPRESSION: Satisfactory reduction of bilateral hip arthroplasties. No immediate post reduction complications. Electronically Signed   By: Ashley Royalty M.D.   On: 04/18/2017 23:25   Dg Chest Portable 1 View  Result Date: 04/18/2017 CLINICAL DATA:  Fall.  Fall in her bedroom, second fall downstairs. EXAM: PORTABLE CHEST 1 VIEW COMPARISON:  Radiograph 02/17/2014 FINDINGS: Chronic hyperinflation and bronchitic change. Unchanged heart size and mediastinal contours. No consolidation, pleural effusion or pneumothorax. Reverse left shoulder arthroplasty. Chronic change about the right shoulder. No acute osseous abnormalities are seen. IMPRESSION: Chronic hyperinflation and bronchial thickening, imaging findings consistent with COPD. No acute abnormality. Electronically  Signed   By: Jeb Levering M.D.   On: 04/18/2017 22:08   Dg Hips Bilat With Pelvis 3-4 Views  Result Date: 04/18/2017 CLINICAL DATA:  Bilateral hip pain after fall. Fall in her bedroom, second fall downstairs. EXAM: DG HIP (WITH OR WITHOUT PELVIS) 3-4V BILAT COMPARISON:  None. FINDINGS: Bilateral hip arthroplasties with posterolateral dislocation of both femoral head components from the acetabular components. No fracture of either hip. The pubic rami are intact. Sacroiliac joints are congruent. The bones are under mineralized. IMPRESSION: Bilateral total hip arthroplasties with bilateral posterolateral dislocation of the femoral head components. No associated fracture. Electronically Signed   By: Jeb Levering M.D.   On: 04/18/2017 21:02    EKG: sinus tachycardia, left anterior fascicular block.   Assessment/Plan Active Problems:   Chronic diarrhea    Osteoarthritis of hand   Osteoarthritis of foot   Fall at home, initial encounter   Bilateral hip dislocation ([place])   Uncontrolled pain   Hypertension   Leukocytosis  1-Fall; unclear is syncope. Monitor on telemetry. Cycle cardiac enzymes.  Will likely need placement.  Check CK level.   2-Bilateral Hips dislocation. Reduce in the ED.. patient still with pain.  IV fentanyl.  ED will consult ortho.  PT after ortho evaluation.   3-Leukocytosis; might be demargination.  Chest x ray negative for PNA.  Check UA>  Repeat labs in am.   4-History of chronic Diarrhea;  Consider resuming Paregoric for Diarrhea.  Loperamide PRN.   5-HTN; PRN hydralazine.   6-Arthritis; Naproxen. Will order pepcid.   7-Hypoxemia; on 4 L oxygen, probably  post sedation. Chest x ray negative. Titrate off oxygen as tolerated.  Nebulizer PRN>   8-head hematoma laceration   post staple.  9-CKD stage III. Last cr 1.1; monitor.      DVT prophylaxis: SCD for now.  Code Status: Full code, patient wishes.  Family Communication: no family at bedside.  Disposition Plan: will benefit for SNF, was living alone.  Consults called: ortho to be consulted by ED>  Admission status:  Telemetry, pain controlled, needs PT>    Elmarie Shiley MD [facility] Pager [phone]  If 7PM-7AM, please contact night-coverage [email or web address] Password [email or web address]  04/19/2017, 1:18 AM

## 2017-04-19 NOTE — Progress Notes (Signed)
Patient had not voided since admission to the unit at about 3am today. Bladder scan performed and showed about 500cc of urine in the bladder. Patient encouraged to attempt to urinate. She did urinate via external catheter, but only able to void 75cc. UA and UDS sent to lab. MD made aware of findings. No new orders at this time, will continue to monitor urine output.   Othella Boyer [facility] 04/19/2017 11:15 AM

## 2017-04-19 NOTE — Progress Notes (Signed)
CSW following for discharge to SNF. 

## 2017-04-19 NOTE — NC FL2 (Signed)
[place] MEDICAID FL2 LEVEL OF CARE SCREENING TOOL     IDENTIFICATION  Patient Name: [name] Birthdate: Jun 04, 1930 Sex: female Admission Date (Current Location): 04/18/2017  [facility] and [place] Number:  Herbalist and Address:  [facility],  [place] [address] [place]      Provider Number: [id number]  Attending Physician Name and Address:  Marene Lenz, MD  Relative Name and Phone Number:       Current Level of Care: Hospital Recommended Level of Care: [place] Prior Approval Number:    Date Approved/Denied:   PASRR Number: [id number]  Discharge Plan: SNF    Current Diagnoses: Patient Active Problem List   Diagnosis Date Noted  . Fall at home, initial encounter 04/19/2017  . Bilateral hip dislocation ([place]) 04/19/2017  . Uncontrolled pain 04/19/2017  . Hypertension 04/19/2017  . Leukocytosis 04/19/2017  . Elevated troponin 04/19/2017  . Osteoarthritis of hand 01/01/2016  . 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 Unspecified osteoarthritis, unspecified site 01/01/2016  . S/p reverse total shoulder arthroplasty 08/27/2014  . Chronic diarrhea 12/22/2011    Orientation RESPIRATION BLADDER Height & Weight     Self, Time, Situation, Place  Normal Incontinent, External catheter Weight: 120 lb 12.8 oz (54.8 kg) Height:  5\' 5"  (165.1 cm)  BEHAVIORAL SYMPTOMS/MOOD NEUROLOGICAL BOWEL NUTRITION STATUS      Continent Diet(full liquid diet- see DC summary for updated diet)  AMBULATORY STATUS COMMUNICATION OF NEEDS Skin   Limited Assist Verbally      scalp laceration, post suturing- can be removed in 7-10 days                   Personal Care Assistance Level of Assistance  Bathing, Feeding, Dressing Bathing Assistance: Limited assistance Feeding assistance: Independent Dressing Assistance: Limited assistance     Functional Limitations Info  Sight, Hearing, Speech Sight Info: Adequate Hearing Info:  Adequate Speech Info: Adequate    SPECIAL CARE FACTORS FREQUENCY  PT (By licensed PT), OT (By licensed OT)     PT Frequency: 5x OT Frequency: 5x            Contractures Contractures Info: Not present    Additional Factors Info  Code Status, Allergies Code Status Info: full code Allergies Info: Hydromorphone, Lisinopril, Mirtazapine, Nitrofuran Derivatives, Septra Bactrim           Current Medications (04/19/2017):  This is the current hospital active medication list Current Facility-Administered Medications  Medication Dose Route Frequency Provider Last Rate Last Dose  . 0.9 %  sodium chloride infusion   Intravenous Continuous Regalado, Belkys A, MD 75 mL/hr at 04/19/17 1509    . acetaminophen (TYLENOL) tablet 650 mg  650 mg Oral Q6H PRN Regalado, Belkys A, MD       Or  . acetaminophen (TYLENOL) suppository 650 mg  650 mg Rectal Q6H PRN Regalado, Belkys A, MD      . albuterol (PROVENTIL) (2.5 MG/3ML) 0.083% nebulizer solution 2.5 mg  2.5 mg Nebulization Q6H PRN Regalado, Belkys A, MD      . diphenoxylate-atropine (LOMOTIL) 2.5-0.025 MG per tablet 2 tablet  2 tablet Oral TID BM PRN Regalado, Belkys A, MD      . famotidine (PEPCID) tablet 20 mg  20 mg Oral Daily Regalado, Belkys A, MD   20 mg at 04/19/17 0932  . fentaNYL (SUBLIMAZE) injection 12.5 mcg  12.5 mcg Intravenous Q2H PRN Regalado, Belkys A, MD   12.5 mcg at 04/19/17  1453  . heparin injection 5,000 Units  5,000 Units Subcutaneous Q8H Adhikari Bk, Amrit, MD      . hydrALAZINE (APRESOLINE) injection 10 mg  10 mg Intravenous Q6H PRN Regalado, Belkys A, MD   10 mg at 04/19/17 0318  . naproxen sodium (ANAPROX) tablet 275 mg  275 mg Oral BID PRN Regalado, Belkys A, MD      . ondansetron (ZOFRAN) tablet 4 mg  4 mg Oral Q6H PRN Regalado, Belkys A, MD       Or  . ondansetron (ZOFRAN) injection 4 mg  4 mg Intravenous Q6H PRN Regalado, Belkys A, MD         Discharge Medications: Please see discharge summary for a list of  discharge medications.  Relevant Imaging Results:  Relevant Lab Results:   Additional Information SS# [id number]  Nila Nephew, LCSW

## 2017-04-19 NOTE — Progress Notes (Signed)
PROGRESS NOTE    [name]  HWE:[id number] DOB: 1930-04-16 DOA: 04/18/2017 PCP: Lajean Manes, MD   Brief Narrative: Patient is a 84 year old  female with past medical history significant for hypertension but currently not on medication, bilateral hip replacement who presented to the emergency department after she fell at home.  She was found to have bilateral hip dislocation. Hip dislocation was reduced in the emergency department.  Patient has been evaluated by orthopedics.  Patient also evaluated by physical therapy and recommended skilled nursing facility.  Assessment & Plan:   Active Problems:   Chronic diarrhea   Osteoarthritis of hand   Osteoarthritis of foot   Fall at home, initial encounter   Bilateral hip dislocation ([place])   Uncontrolled pain   Hypertension   Elevated troponin  Bilateral hip dislocation/fall: Reduced in the emergency department.  Continue pain management. Patient already evaluated by orthopedics. Physical therapy recommended skilled nursing facility.  Social worker consulted. Patient has bilateral prosthetic hips.  Elevated troponin: Patient denies any chest pain.  Troponin found to be elevated.  I discussed with cardiology,Dr. Johnsie Cancel.  No further intervention necessary.We will continue to monitor the trend. EKG just showed undetermined septal infarct.  No ST changes.  History of chronic diarrhea: Currently stable.  Continue with loperamide as needed   History of hypertension: She is not on any medications at home  currently.  Her blood pressure is stable.    Arthritis:On naproxen.   CKD stage III: Kidney function on baseline.  Scalp laceration: Status post suturing .Can be removed in 7-10 days at skilled nursing facility.     DVT prophylaxis:SCD Code Status: Full Family Communication: None present at the bedside Disposition Plan: Skilled nursing facility   Consultants: Orthopedics  Procedures: Bilateral hip reduction  Antimicrobials:  None  Subjective: Patient seen and examined the bedside this morning.  Remains comfortable but complains of bilateral hip pain.  Pain is getting better.  Denies any chest pain.  Objective: Vitals:   04/19/17 0330 04/19/17 0415 04/19/17 0623 04/19/17 1219  BP: (!) 180/91 (!) 150/80 140/70 (!) 149/93  Pulse: (!) 115  (!) 104 95  Resp: 16  16 20   Temp: 99.2 F (37.3 C)  98.4 F (36.9 C) 98.5 F (36.9 C)  TempSrc: Oral  Oral Oral  SpO2: 98%  98% 98%  Weight: 54.8 kg (120 lb 12.8 oz)     Height: 5\' 5"  (1.651 m)       Intake/Output Summary (Last 24 hours) at 04/19/2017 1421 Last data filed at 04/19/2017 1048 Gross per 24 hour  Intake -  Output 75 ml  Net -75 ml   Filed Weights   04/18/17 2019 04/19/17 0330  Weight: 52.2 kg (115 lb) 54.8 kg (120 lb 12.8 oz)    Examination:  General exam: Appears calm and comfortable ,thin/malnourished HEENT:PERRL,Oral mucosa moist, Ear/Nose normal on gross exam Respiratory system: Bilateral equal air entry, normal vesicular breath sounds, no wheezes or crackles  Cardiovascular system: S1 & S2 heard, RRR. No JVD, murmurs, rubs, gallops or clicks. No pedal edema. Gastrointestinal system: Abdomen is nondistended, soft and nontender. No organomegaly or masses felt. Normal bowel sounds heard. Central nervous system: Alert and oriented. No focal neurological deficits. Extremities: No edema, no clubbing ,no cyanosis, distal peripheral pulses palpable. Tenderness on bilateral hip Skin: No rashes, lesions ,no icterus ,no pallor,small abrasions/ulcerations scattered around her lower extremities, suture on the lacerated area of the scalp Psychiatry: Judgement and insight appear normal. Mood &  affect appropriate.     Data Reviewed: I have personally reviewed following labs and imaging studies  CBC: Recent Labs  Lab 04/18/17 2144 04/19/17 0720  WBC 16.0* 9.0  NEUTROABS 14.3*  --   HGB 11.7* 9.7*  HCT 36.6 29.3*  MCV 94.8 93.6  PLT 267 580    Basic Metabolic Panel: Recent Labs  Lab 04/18/17 2144 04/19/17 0720  NA 139 140  K 4.8 4.2  CL 105 107  CO2 23 23  GLUCOSE 139* 121*  BUN 30* 29*  CREATININE 1.24* 1.14*  CALCIUM 9.8 8.8*   GFR: Estimated Creatinine Clearance: 31.8 mL/min (A) (by C-G formula based on SCr of 1.14 mg/dL (H)). Liver Function Tests: Recent Labs  Lab 04/18/17 2144  AST 46*  ALT 26  ALKPHOS 65  BILITOT 1.1  PROT 6.8  ALBUMIN 3.7   No results for input(s): LIPASE, AMYLASE in the last 168 hours. No results for input(s): AMMONIA in the last 168 hours. Coagulation Profile: No results for input(s): INR, PROTIME in the last 168 hours. Cardiac Enzymes: Recent Labs  Lab 04/18/17 2201 04/19/17 0720 04/19/17 1018  CKTOTAL 351*  --   --   TROPONINI <0.03 0.09* 0.31*   BNP (last 3 results) No results for input(s): PROBNP in the last 8760 hours. HbA1C: No results for input(s): HGBA1C in the last 72 hours. CBG: No results for input(s): GLUCAP in the last 168 hours. Lipid Profile: No results for input(s): CHOL, HDL, LDLCALC, TRIG, CHOLHDL, LDLDIRECT in the last 72 hours. Thyroid Function Tests: No results for input(s): TSH, T4TOTAL, FREET4, T3FREE, THYROIDAB in the last 72 hours. Anemia Panel: No results for input(s): VITAMINB12, FOLATE, FERRITIN, TIBC, IRON, RETICCTPCT in the last 72 hours. Sepsis Labs: No results for input(s): PROCALCITON, LATICACIDVEN in the last 168 hours.  No results found for this or any previous visit (from the past 240 hour(s)).       Radiology Studies: Ct Head Wo Contrast  Result Date: 04/18/2017 CLINICAL DATA:  Weakness with several falls sustaining head lacerations. EXAM: CT HEAD WITHOUT CONTRAST CT CERVICAL SPINE WITHOUT CONTRAST TECHNIQUE: Multidetector CT imaging of the head and cervical spine was performed following the standard protocol without intravenous contrast. Multiplanar CT image reconstructions of the cervical spine were also generated.  COMPARISON:  12/14/2016 and 06/05/2016 CT head exams. FINDINGS: CT HEAD FINDINGS Brain: Redemonstration of extra-axial hyperdense left frontal mass compatible with a non calcified meningioma. Given slice sampling differences, this mass measures approximately 2.4 x 1.9 cm currently. Slight localized mass effect on the adjacent brain without edema. No intracranial hemorrhage, midline shift or edema. Chronic moderate small vessel ischemic disease and atrophy is otherwise noted. Midline fourth ventricle and basal cisterns. Vascular: No hyperdense vessels. Skull: No skull fracture. Stable mild scalp ossification anterior to the frontal sinus. Sinuses/Orbits: No acute finding. Other: Frontal as well as frontoparietal scalp contusions. The frontoparietal contusion also demonstrates soft tissue lacerations and an associated scalp hematoma. CT CERVICAL SPINE FINDINGS Alignment: Intact craniocervical relationship. Osteoarthritis of the atlantodental interval with joint space narrowing spurring. Maintained cervical lordosis. Skull base and vertebrae: No skull base fracture. No vertebral body fracture. Soft tissues and spinal canal: No prevertebral soft tissue swelling. No visible canal hematoma. Disc levels: Marked cervical spondylosis with degenerative disc disease, mild at C2-3 and moderate-to-marked from C3 through T1. Small posterior marginal osteophytes and uncovertebral joint osteoarthritis with uncinate spurring is noted from C3 through T1. Associated bilateral facet arthropathy is identified. These contribute to bilateral neural foraminal  encroachment greatest at C3-4 on the left. Upper chest: Apical pleuroparenchymal scarring bilaterally. Other: None IMPRESSION: 1. Frontal and frontoparietal scalp contusions. Associated scalp laceration and hematoma associated with the frontoparietal scalp contusion. No underlying skull fracture. 2. Chronic stable left frontal region meningioma with localized mass effect but no  associated edema. 3. Chronic moderate small vessel ischemic disease of the brain. No acute intracranial abnormality. 4. Cervical spondylosis without acute cervical spine fracture. Electronically Signed   By: Ashley Royalty M.D.   On: 04/18/2017 22:41   Ct Cervical Spine Wo Contrast  Result Date: 04/18/2017 CLINICAL DATA:  Weakness with several falls sustaining head lacerations. EXAM: CT HEAD WITHOUT CONTRAST CT CERVICAL SPINE WITHOUT CONTRAST TECHNIQUE: Multidetector CT imaging of the head and cervical spine was performed following the standard protocol without intravenous contrast. Multiplanar CT image reconstructions of the cervical spine were also generated. COMPARISON:  12/14/2016 and 06/05/2016 CT head exams. FINDINGS: CT HEAD FINDINGS Brain: Redemonstration of extra-axial hyperdense left frontal mass compatible with a non calcified meningioma. Given slice sampling differences, this mass measures approximately 2.4 x 1.9 cm currently. Slight localized mass effect on the adjacent brain without edema. No intracranial hemorrhage, midline shift or edema. Chronic moderate small vessel ischemic disease and atrophy is otherwise noted. Midline fourth ventricle and basal cisterns. Vascular: No hyperdense vessels. Skull: No skull fracture. Stable mild scalp ossification anterior to the frontal sinus. Sinuses/Orbits: No acute finding. Other: Frontal as well as frontoparietal scalp contusions. The frontoparietal contusion also demonstrates soft tissue lacerations and an associated scalp hematoma. CT CERVICAL SPINE FINDINGS Alignment: Intact craniocervical relationship. Osteoarthritis of the atlantodental interval with joint space narrowing spurring. Maintained cervical lordosis. Skull base and vertebrae: No skull base fracture. No vertebral body fracture. Soft tissues and spinal canal: No prevertebral soft tissue swelling. No visible canal hematoma. Disc levels: Marked cervical spondylosis with degenerative disc disease,  mild at C2-3 and moderate-to-marked from C3 through T1. Small posterior marginal osteophytes and uncovertebral joint osteoarthritis with uncinate spurring is noted from C3 through T1. Associated bilateral facet arthropathy is identified. These contribute to bilateral neural foraminal encroachment greatest at C3-4 on the left. Upper chest: Apical pleuroparenchymal scarring bilaterally. Other: None IMPRESSION: 1. Frontal and frontoparietal scalp contusions. Associated scalp laceration and hematoma associated with the frontoparietal scalp contusion. No underlying skull fracture. 2. Chronic stable left frontal region meningioma with localized mass effect but no associated edema. 3. Chronic moderate small vessel ischemic disease of the brain. No acute intracranial abnormality. 4. Cervical spondylosis without acute cervical spine fracture. Electronically Signed   By: Ashley Royalty M.D.   On: 04/18/2017 22:41   Dg Pelvis Portable  Result Date: 04/18/2017 CLINICAL DATA:  Bilateral hip reductions. EXAM: PORTABLE PELVIS 1-2 VIEWS COMPARISON:  04/18/2017 pre reduction radiographs. FINDINGS: Reduction of bilateral hip arthroplasties without hardware failure nor fracture. Lower lumbar degenerative disc and facet arthropathy. Bony pelvis appears intact. Surgical anchors project over the pubic symphysis. IMPRESSION: Satisfactory reduction of bilateral hip arthroplasties. No immediate post reduction complications. Electronically Signed   By: Ashley Royalty M.D.   On: 04/18/2017 23:25   Dg Chest Portable 1 View  Result Date: 04/18/2017 CLINICAL DATA:  Fall.  Fall in her bedroom, second fall downstairs. EXAM: PORTABLE CHEST 1 VIEW COMPARISON:  Radiograph 02/17/2014 FINDINGS: Chronic hyperinflation and bronchitic change. Unchanged heart size and mediastinal contours. No consolidation, pleural effusion or pneumothorax. Reverse left shoulder arthroplasty. Chronic change about the right shoulder. No acute osseous abnormalities are  seen. IMPRESSION: Chronic hyperinflation and  bronchial thickening, imaging findings consistent with COPD. No acute abnormality. Electronically Signed   By: Jeb Levering M.D.   On: 04/18/2017 22:08   Dg Hips Bilat With Pelvis 3-4 Views  Result Date: 04/18/2017 CLINICAL DATA:  Bilateral hip pain after fall. Fall in her bedroom, second fall downstairs. EXAM: DG HIP (WITH OR WITHOUT PELVIS) 3-4V BILAT COMPARISON:  None. FINDINGS: Bilateral hip arthroplasties with posterolateral dislocation of both femoral head components from the acetabular components. No fracture of either hip. The pubic rami are intact. Sacroiliac joints are congruent. The bones are under mineralized. IMPRESSION: Bilateral total hip arthroplasties with bilateral posterolateral dislocation of the femoral head components. No associated fracture. Electronically Signed   By: Jeb Levering M.D.   On: 04/18/2017 21:02        Scheduled Meds: . aspirin  325 mg Oral Once  . famotidine  20 mg Oral Daily   Continuous Infusions: . sodium chloride 75 mL/hr (04/19/17 0318)     LOS: 0 days    Time spent: 25 minutes.More than 50% of that time was spent in counseling and/or coordination of care.      Marene Lenz, MD [facility] Pager [phone]  If 7PM-7AM, please contact night-coverage [email or web address] Password [email or web address] 04/19/2017, 2:21 PM

## 2017-04-19 NOTE — Progress Notes (Signed)
CRITICAL VALUE ALERT  Critical Value:  Troponin 0.09  Date & Time Notied:  04/19/2017 8:25 AM  Provider Notified: Dr. Tawanna Solo  Orders Received/Actions taken:

## 2017-04-19 NOTE — Progress Notes (Signed)
Troponin levels have been trending up. Started at 0.09>0.31>0.72. MD made aware of all results. Patient denies and CP or SOB. EKG obtained this morning per MD orders and was unremarkable. Will continue to monitor patient.   [name] [facility]

## 2017-04-19 NOTE — ED Notes (Signed)
ED TO INPATIENT HANDOFF REPORT  Name/Age/Gender [name] 84 y.o. female  Code Status Code Status History    Date Active Date Inactive Code Status Order ID Comments User Context   08/27/2014 13:38 08/29/2014 18:16 Full Code [id number]  Jenetta Loges, PA-C Inpatient    Advance Directive Documentation     Most Recent Value  Type of Advance Directive  Healthcare Power of Attorney, Living will  Pre-existing out of facility DNR order (yellow form or pink MOST form)  No data  "MOST" Form in Place?  No data      Home/SNF/Other Skilled nursing facility  Chief Complaint Fall  Level of Care/Admitting Diagnosis ED Disposition    ED Disposition Condition Comment   Admit  Hospital Area: [facility] [[id number]]  Level of Care: Telemetry [5]  Admit to tele based on following criteria: Monitor QTC interval  Diagnosis: Uncontrolled pain [721020]  Admitting Physician: Elmarie Shiley [phone]  Attending Physician: Elmarie Shiley [phone]  Estimated length of stay: 3 - 4 days  Certification:: I certify this patient will need inpatient services for at least 2 midnights  PT Class (Do Not Modify): Inpatient [101]  PT Acc Code (Do Not Modify): Private [1]       Medical History Past Medical History:  Diagnosis Date  . Anemia   . Arthritis   . Cancer ([place])    melanoma  . Chronic diarrhea   . Complication of anesthesia    hallucinations in the past, last surgery was OK anesth.   Marland Kitchen COPD (chronic obstructive pulmonary disease) ([place])   . 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  Marland Kitchen GERD (gastroesophageal reflux disease)   . Goiter   . Headache(784.0)    sinus  . History of blood transfusion 2006   due to colon bleeding   . Hypertension   . Neuromuscular disorder ([place])    peripheral neuropathy - both feet   . Neuropathy   . Osteoporosis   . Shortness of breath dyspnea   . UTI (lower urinary tract infection)    start Tx today /w probiotic-  08/18/2014     Allergies Allergies  Allergen Reactions  . Hydromorphone     Pt does not remember  . Lisinopril     fatigue  . Mirtazapine     Pt doesn't remember.    . Nitrofuran Derivatives Diarrhea  . Septra [Bactrim]     Pt doesn't remember.     IV Location/Drains/Wounds Patient Lines/Drains/Airways Status   Active Line/Drains/Airways    Name:   Placement date:   Placement time:   Site:   Days:   Incision (Closed) 08/27/14 Shoulder Left   08/27/14    0954     966          Labs/Imaging Results for orders placed or performed during the hospital encounter of 04/18/17 (from the past 48 hour(s))  Comprehensive metabolic panel     Status: Abnormal   Collection Time: 04/18/17  9:44 PM  Result Value Ref Range   Sodium 139 135 - 145 mmol/L   Potassium 4.8 3.5 - 5.1 mmol/L   Chloride 105 101 - 111 mmol/L   CO2 23 22 - 32 mmol/L   Glucose, Bld 139 (H) 65 - 99 mg/dL   BUN 30 (H) 6 - 20 mg/dL   Creatinine, Ser 1.24 (H) 0.44 - 1.00 mg/dL   Calcium 9.8 8.9 - 10.3 mg/dL   Total  Protein 6.8 6.5 - 8.1 g/dL   Albumin 3.7 3.5 - 5.0 g/dL   AST 46 (H) 15 - 41 U/L   ALT 26 14 - 54 U/L   Alkaline Phosphatase 65 38 - 126 U/L   Total Bilirubin 1.1 0.3 - 1.2 mg/dL   GFR calc non Af Amer 39 (L) >60 mL/min   GFR calc Af Amer 45 (L) >60 mL/min    Comment: (NOTE) The eGFR has been calculated using the CKD EPI equation. This calculation has not been validated in all clinical situations. eGFR's persistently <60 mL/min signify possible Chronic Kidney Disease.    Anion gap 11 5 - 15    Comment: Performed at [facility], [place] [address] [place], [place] [address]  CBC with Differential     Status: Abnormal   Collection Time: 04/18/17  9:44 PM  Result Value Ref Range   WBC 16.0 (H) 4.0 - 10.5 K/uL   RBC 3.86 (L) 3.87 - 5.11 MIL/uL   Hemoglobin 11.7 (L) 12.0 - 15.0 g/dL   HCT 36.6 36.0 - 46.0 %   MCV 94.8 78.0 - 100.0 fL   MCH 30.3 26.0 - 34.0 pg   MCHC 32.0 30.0 -  36.0 g/dL   RDW 14.5 11.5 - 15.5 %   Platelets 267 150 - 400 K/uL   Neutrophils Relative % 89 %   Neutro Abs 14.3 (H) 1.7 - 7.7 K/uL   Lymphocytes Relative 4 %   Lymphs Abs 0.6 (L) 0.7 - 4.0 K/uL   Monocytes Relative 7 %   Monocytes Absolute 1.1 (H) 0.1 - 1.0 K/uL   Eosinophils Relative 0 %   Eosinophils Absolute 0.0 0.0 - 0.7 K/uL   Basophils Relative 0 %   Basophils Absolute 0.0 0.0 - 0.1 K/uL    Comment: Performed at [facility], [place] [address] [place], [place] [address]  Ethanol     Status: None   Collection Time: 04/18/17  9:44 PM  Result Value Ref Range   Alcohol, Ethyl (B) <10 <10 mg/dL    Comment:        LOWEST DETECTABLE LIMIT FOR SERUM ALCOHOL IS 10 mg/dL FOR MEDICAL PURPOSES ONLY Performed at [facility], [place] [address] [place], [place] [address]   I-stat troponin, ED     Status: None   Collection Time: 04/18/17  9:59 PM  Result Value Ref Range   Troponin i, poc 0.02 0.00 - 0.08 ng/mL   Comment 3            Comment: Due to the release kinetics of cTnI, a negative result within the first hours of the onset of symptoms does not rule out myocardial infarction with certainty. If myocardial infarction is still suspected, repeat the test at appropriate intervals.    Ct Head Wo Contrast  Result Date: 04/18/2017 CLINICAL DATA:  Weakness with several falls sustaining head lacerations. EXAM: CT HEAD WITHOUT CONTRAST CT CERVICAL SPINE WITHOUT CONTRAST TECHNIQUE: Multidetector CT imaging of the head and cervical spine was performed following the standard protocol without intravenous contrast. Multiplanar CT image reconstructions of the cervical spine were also generated. COMPARISON:  12/14/2016 and 06/05/2016 CT head exams. FINDINGS: CT HEAD FINDINGS Brain: Redemonstration of extra-axial hyperdense left frontal mass compatible with a non calcified meningioma. Given slice sampling differences, this mass measures approximately 2.4 x 1.9 cm  currently. Slight localized mass effect on the adjacent brain without edema. No intracranial hemorrhage, midline shift or edema. Chronic moderate  small vessel ischemic disease and atrophy is otherwise noted. Midline fourth ventricle and basal cisterns. Vascular: No hyperdense vessels. Skull: No skull fracture. Stable mild scalp ossification anterior to the frontal sinus. Sinuses/Orbits: No acute finding. Other: Frontal as well as frontoparietal scalp contusions. The frontoparietal contusion also demonstrates soft tissue lacerations and an associated scalp hematoma. CT CERVICAL SPINE FINDINGS Alignment: Intact craniocervical relationship. Osteoarthritis of the atlantodental interval with joint space narrowing spurring. Maintained cervical lordosis. Skull base and vertebrae: No skull base fracture. No vertebral body fracture. Soft tissues and spinal canal: No prevertebral soft tissue swelling. No visible canal hematoma. Disc levels: Marked cervical spondylosis with degenerative disc disease, mild at C2-3 and moderate-to-marked from C3 through T1. Small posterior marginal osteophytes and uncovertebral joint osteoarthritis with uncinate spurring is noted from C3 through T1. Associated bilateral facet arthropathy is identified. These contribute to bilateral neural foraminal encroachment greatest at C3-4 on the left. Upper chest: Apical pleuroparenchymal scarring bilaterally. Other: None IMPRESSION: 1. Frontal and frontoparietal scalp contusions. Associated scalp laceration and hematoma associated with the frontoparietal scalp contusion. No underlying skull fracture. 2. Chronic stable left frontal region meningioma with localized mass effect but no associated edema. 3. Chronic moderate small vessel ischemic disease of the brain. No acute intracranial abnormality. 4. Cervical spondylosis without acute cervical spine fracture. Electronically Signed   By: Ashley Royalty M.D.   On: 04/18/2017 22:41   Ct Cervical Spine Wo  Contrast  Result Date: 04/18/2017 CLINICAL DATA:  Weakness with several falls sustaining head lacerations. EXAM: CT HEAD WITHOUT CONTRAST CT CERVICAL SPINE WITHOUT CONTRAST TECHNIQUE: Multidetector CT imaging of the head and cervical spine was performed following the standard protocol without intravenous contrast. Multiplanar CT image reconstructions of the cervical spine were also generated. COMPARISON:  12/14/2016 and 06/05/2016 CT head exams. FINDINGS: CT HEAD FINDINGS Brain: Redemonstration of extra-axial hyperdense left frontal mass compatible with a non calcified meningioma. Given slice sampling differences, this mass measures approximately 2.4 x 1.9 cm currently. Slight localized mass effect on the adjacent brain without edema. No intracranial hemorrhage, midline shift or edema. Chronic moderate small vessel ischemic disease and atrophy is otherwise noted. Midline fourth ventricle and basal cisterns. Vascular: No hyperdense vessels. Skull: No skull fracture. Stable mild scalp ossification anterior to the frontal sinus. Sinuses/Orbits: No acute finding. Other: Frontal as well as frontoparietal scalp contusions. The frontoparietal contusion also demonstrates soft tissue lacerations and an associated scalp hematoma. CT CERVICAL SPINE FINDINGS Alignment: Intact craniocervical relationship. Osteoarthritis of the atlantodental interval with joint space narrowing spurring. Maintained cervical lordosis. Skull base and vertebrae: No skull base fracture. No vertebral body fracture. Soft tissues and spinal canal: No prevertebral soft tissue swelling. No visible canal hematoma. Disc levels: Marked cervical spondylosis with degenerative disc disease, mild at C2-3 and moderate-to-marked from C3 through T1. Small posterior marginal osteophytes and uncovertebral joint osteoarthritis with uncinate spurring is noted from C3 through T1. Associated bilateral facet arthropathy is identified. These contribute to bilateral neural  foraminal encroachment greatest at C3-4 on the left. Upper chest: Apical pleuroparenchymal scarring bilaterally. Other: None IMPRESSION: 1. Frontal and frontoparietal scalp contusions. Associated scalp laceration and hematoma associated with the frontoparietal scalp contusion. No underlying skull fracture. 2. Chronic stable left frontal region meningioma with localized mass effect but no associated edema. 3. Chronic moderate small vessel ischemic disease of the brain. No acute intracranial abnormality. 4. Cervical spondylosis without acute cervical spine fracture. Electronically Signed   By: Ashley Royalty M.D.   On: 04/18/2017 22:41  Dg Pelvis Portable  Result Date: 04/18/2017 CLINICAL DATA:  Bilateral hip reductions. EXAM: PORTABLE PELVIS 1-2 VIEWS COMPARISON:  04/18/2017 pre reduction radiographs. FINDINGS: Reduction of bilateral hip arthroplasties without hardware failure nor fracture. Lower lumbar degenerative disc and facet arthropathy. Bony pelvis appears intact. Surgical anchors project over the pubic symphysis. IMPRESSION: Satisfactory reduction of bilateral hip arthroplasties. No immediate post reduction complications. Electronically Signed   By: Ashley Royalty M.D.   On: 04/18/2017 23:25   Dg Chest Portable 1 View  Result Date: 04/18/2017 CLINICAL DATA:  Fall.  Fall in her bedroom, second fall downstairs. EXAM: PORTABLE CHEST 1 VIEW COMPARISON:  Radiograph 02/17/2014 FINDINGS: Chronic hyperinflation and bronchitic change. Unchanged heart size and mediastinal contours. No consolidation, pleural effusion or pneumothorax. Reverse left shoulder arthroplasty. Chronic change about the right shoulder. No acute osseous abnormalities are seen. IMPRESSION: Chronic hyperinflation and bronchial thickening, imaging findings consistent with COPD. No acute abnormality. Electronically Signed   By: Jeb Levering M.D.   On: 04/18/2017 22:08   Dg Hips Bilat With Pelvis 3-4 Views  Result Date: 04/18/2017 CLINICAL  DATA:  Bilateral hip pain after fall. Fall in her bedroom, second fall downstairs. EXAM: DG HIP (WITH OR WITHOUT PELVIS) 3-4V BILAT COMPARISON:  None. FINDINGS: Bilateral hip arthroplasties with posterolateral dislocation of both femoral head components from the acetabular components. No fracture of either hip. The pubic rami are intact. Sacroiliac joints are congruent. The bones are under mineralized. IMPRESSION: Bilateral total hip arthroplasties with bilateral posterolateral dislocation of the femoral head components. No associated fracture. Electronically Signed   By: Jeb Levering M.D.   On: 04/18/2017 21:02    Pending Labs Unresulted Labs (From admission, onward)   Start     Ordered   04/19/17 0128  CK  Once,   R     04/19/17 0127   04/19/17 0120  Troponin I (q 6hr x 3)  Now then every 6 hours,   R     04/19/17 0119   04/18/17 2123  Urine rapid drug screen (hosp performed)  STAT,   STAT     04/18/17 2123   04/18/17 2123  Urinalysis, Routine w reflex microscopic  STAT,   STAT     04/18/17 2123   Signed and Held  Basic metabolic panel  Tomorrow morning,   R     Signed and Held   Signed and Held  CBC  Tomorrow morning,   R     Signed and Held      Vitals/Pain Today's Vitals   04/19/17 0000 04/19/17 0030 04/19/17 0100 04/19/17 0130  BP: (!) 162/73 (!) 174/85 (!) 173/79 (!) 153/83  Pulse: 94 95 94 90  Resp: 14 12 12 15   Temp:      TempSrc:      SpO2: 99% 100% 97% 98%  Weight:      Height:      PainSc:        Isolation Precautions No active isolations  Medications Medications  fentaNYL (SUBLIMAZE) injection 12.5 mcg (not administered)  albuterol (PROVENTIL) (2.5 MG/3ML) 0.083% nebulizer solution 2.5 mg (not administered)  hydrALAZINE (APRESOLINE) injection 10 mg (not administered)  famotidine (PEPCID) tablet 20 mg (not administered)  morphine 2 MG/ML injection 2 mg (2 mg Intravenous Given 04/18/17 2149)  ondansetron (ZOFRAN) injection 4 mg (4 mg Intravenous Given  04/18/17 2148)  propofol (DIPRIVAN) 10 mg/mL bolus/IV push 26.1 mg (26.1 mg Intravenous Given 04/18/17 2253)  propofol (DIPRIVAN) 10 mg/mL bolus/IV push (5 mg Intravenous  Given 04/18/17 2258)  propofol (DIPRIVAN) 10 mg/mL bolus/IV push (10 mg Intravenous Given 04/18/17 2302)    Mobility non-ambulatory '

## 2017-04-19 NOTE — Progress Notes (Signed)
RRT called to bedside for bilateral hip reductions.  RT present throughout sedation procedure. Pt remained stable on End-Tidal CO2 monitoring nasal cannula with 6Lpm of O2.

## 2017-04-19 NOTE — Clinical Social Work Note (Signed)
Clinical Social Work Assessment  Patient Details  Name: [name] MRN: [id number] Date of Birth: 04-Dec-1930  Date of referral:  04/19/17               Reason for consult:  Facility Placement                Permission sought to share information with:    Permission granted to share information::     Name::        Agency::     Relationship::     Contact Information:     Housing/Transportation Living arrangements for the past 2 months:  Single Family Home Source of Information:  Patient Patient Interpreter Needed:  None Criminal Activity/Legal Involvement Pertinent to Current Situation/Hospitalization:  No - Comment as needed Significant Relationships:  Neighbor, Adult Children Lives with:  Self Do you feel safe going back to the place where you live?  Yes Need for family participation in patient care:  No (Coment)  Care giving concerns:  Pt lives at home alone, states her nearest supports are her neighbors. Fmaily lives in [place]. States she spends most of her time alone.  Prior to admission states she had started having trouble ambulating. Was using a walker but "fell a couple times, this last time I hit my head, and dislocated my hip and that's how I got here." Pt has had hip replacements in the past.    Facilities manager / plan:  CSW consulted to assist with SNF referral. Met with pt at bedside- she was alert and oriented, became drowsy toward the end of assessment. Discussed SNF recommendation with her and she is reluctant to agree as "she [place] up [place] 8[place] art [place] that [place] next Thursday- I really want to be home by then." CSW and pt processed care needs and options and pt agreed that SNF rehab "is her best chance to get strong enough to go back home alone. Maybe I can start the art class late." Pt has been to [facility] for rehab after her hip replacements in 2018 and she states she would prefer this facility again, however she is open to others if not  available. Completed FL2 and made referrals to area SNFs.  Plan: SNF at [place] for short term rehab. Barriers: Bed offers, insurance authorization (can be initiated once SNF selected)  Employment status:  Retired Engineer, maintenance ([employer])) PT Recommendations:  [place] / Referral to community resources:  [place]  Patient/Family's Response to care:  Pt appreciative of care . "I wanted to come to [facility] because it is so close to my house and I know it here"  Patient/Family's Understanding of and Emotional Response to Diagnosis, Current Treatment, and Prognosis:  Pt demonstrates good understanding of her treatment, good historian, and very realistic about the barriers and expectations. Emotionally she was sad about planning to not return directly home- see above.  Emotional Assessment Appearance:  Appears stated age Attitude/Demeanor/Rapport:  Engaged Affect (typically observed):  Accepting, Adaptable, Calm Orientation:  Oriented to Self, Oriented to Place, Oriented to  Time, Oriented to Situation Alcohol / Substance use:  Not Applicable Psych involvement (Current and /or in the community):  No (Comment)  Discharge Needs  Concerns to be addressed:  Care Coordination, Discharge Planning Concerns Readmission within the last 30 days:  No Current discharge risk:  Dependent with Mobility Barriers to Discharge:  Continued Medical Work up, Dance movement psychotherapist  Valeta Harms, LCSW 04/19/2017, 4:01 PM  [phone]

## 2017-04-19 NOTE — ED Provider Notes (Signed)
.  Sedation Date/Time: 04/19/2017 8:05 AM Performed by: Margette Fast, MD Authorized by: Margette Fast, MD   Consent:    Consent obtained:  Verbal and written   Consent given by:  Patient   Risks discussed:  Allergic reaction, prolonged hypoxia resulting in organ damage, prolonged sedation necessitating reversal, dysrhythmia, inadequate sedation, respiratory compromise necessitating ventilatory assistance and intubation, vomiting and nausea Universal protocol:    Procedure explained and questions answered to patient or proxy's satisfaction: yes     Relevant documents present and verified: yes     Test results available and properly labeled: yes     Imaging studies available: yes     Required blood products, implants, devices, and special equipment available: yes     Immediately prior to procedure a time out was called: yes     Patient identity confirmation method:  Provided demographic data, verbally with patient, arm band and hospital-assigned identification number Indications:    Procedure performed:  Dislocation reduction   Procedure necessitating sedation performed by:  Physician performing sedation   Intended level of sedation:  Moderate (conscious sedation) Pre-sedation assessment:    Time since last food or drink:  8   ASA classification: class 1 - normal, healthy patient     Neck mobility: normal     Mallampati score:  I - soft palate, uvula, fauces, pillars visible   Pre-sedation assessments completed and reviewed: airway patency, cardiovascular function, hydration status, mental status, nausea/vomiting, pain level, respiratory function and temperature   Immediate pre-procedure details:    Reassessment: Patient reassessed immediately prior to procedure     Reviewed: vital signs, relevant labs/tests and NPO status     Verified: bag valve mask available, emergency equipment available, intubation equipment available, IV patency confirmed, oxygen available and reversal medications  available   Procedure details (see MAR for exact dosages):    Preoxygenation:  Nasal cannula   Sedation:  Propofol   Analgesia:  Morphine   Intra-procedure monitoring:  Blood pressure monitoring, cardiac monitor, continuous capnometry, continuous pulse oximetry, frequent LOC assessments and frequent vital sign checks   Intra-procedure events: none     Intra-procedure management:  Airway repositioning   Total Provider sedation time (minutes):  35 Post-procedure details:    Attendance: Constant attendance by certified staff until patient recovered     Recovery: Patient returned to pre-procedure baseline     Post-sedation assessments completed and reviewed: airway patency, cardiovascular function, hydration status, mental status, nausea/vomiting, pain level, respiratory function and temperature     Patient is stable for discharge or admission: yes     Patient tolerance:  Tolerated well, no immediate complications   Nanda Quinton, MD   Margette Fast, MD 04/19/17 [phone]

## 2017-04-20 DIAGNOSIS — W19XXXA Unspecified fall, initial encounter: Secondary | ICD-10-CM

## 2017-04-20 DIAGNOSIS — K529 Noninfective gastroenteritis and colitis, unspecified: Secondary | ICD-10-CM | POA: Diagnosis not present

## 2017-04-20 DIAGNOSIS — S73005A Unspecified dislocation of left hip, initial encounter: Principal | ICD-10-CM

## 2017-04-20 DIAGNOSIS — S73004A Unspecified dislocation of right hip, initial encounter: Secondary | ICD-10-CM

## 2017-04-20 DIAGNOSIS — R748 Abnormal levels of other serum enzymes: Secondary | ICD-10-CM | POA: Diagnosis not present

## 2017-04-20 DIAGNOSIS — Y92009 Unspecified place in unspecified non-institutional (private) residence as the place of occurrence of the external cause: Secondary | ICD-10-CM | POA: Diagnosis not present

## 2017-04-20 DIAGNOSIS — M19071 Primary osteoarthritis, right ankle and foot: Secondary | ICD-10-CM

## 2017-04-20 DIAGNOSIS — I10 Essential (primary) hypertension: Secondary | ICD-10-CM | POA: Diagnosis not present

## 2017-04-20 DIAGNOSIS — M19072 Primary osteoarthritis, left ankle and foot: Secondary | ICD-10-CM

## 2017-04-20 LAB — TROPONIN I
Troponin I: 0.77 ng/mL (ref <0.03)
Troponin I: 0.83 ng/mL (ref <0.03)

## 2017-04-20 MED ORDER — DIPHENOXYLATE-ATROPINE 2.5-0.025 MG PO TABS: 2.0000 | ORAL_TABLET | Freq: Three times a day (TID) | ORAL | 0 refills | Status: DC

## 2017-04-20 MED ORDER — OXYCODONE-ACETAMINOPHEN 5-325 MG PO TABS: 1.0000 | ORAL_TABLET | Freq: Four times a day (QID) | ORAL | 0 refills | Status: DC | PRN

## 2017-04-20 NOTE — Discharge Summary (Signed)
Physician Discharge Summary  [name] GMW:[id number] DOB: 07-Aug-1930 DOA: 04/18/2017  PCP: Lajean Manes, MD  Admit date: 04/18/2017 Discharge date: 04/20/2017  Admitted From: Home Disposition:  SNF  Recommendations for Outpatient Follow-up:  1. Follow up with SNF provider at earliest convenience 2. Follow up with Orthopedics in 1-2 weeks 3. Fall precautions  Home Health: No  Equipment/Devices:None Discharge Condition: Stable  CODE STATUS: Full  Diet recommendation: Heart Healthy   Brief/Interim Summary: Patient is a 84 year old  female with past medical history significant for hypertension but currently not on medication, bilateral hip replacement who presented to the emergency department after she fell at home.  She was found to have bilateral hip dislocation. Hip dislocation was reduced in the emergency department.  Patient has been evaluated by orthopedics.  Patient also evaluated by physical therapy and recommended skilled nursing facility.  Patient will be discharged to nursing home once bed is available.    Discharge Diagnoses:  Active Problems:   Chronic diarrhea   Osteoarthritis of hand   Osteoarthritis of foot   Fall at home, initial encounter   Bilateral hip dislocation ([place])   Uncontrolled pain   Hypertension   Elevated troponin  Bilateral hip dislocation after fall: Reduced in the emergency department.  Continue pain management. Patient already evaluated by orthopedics. Outpatient follow-up with orthopedics. Physical therapy recommended skilled nursing facility.  Patient has bilateral prosthetic hips. Patient will be discharged to nursing home once bed is available. Fall precautions  Elevated troponin: Patient denies any chest pain.  Troponin found to be elevated.  I discussed with cardiology,Dr. Johnsie Cancel.  No further intervention necessary.We will continue to monitor the trend. EKG just showed undetermined septal infarct.  No ST changes. - Outpatient  follow-up with cardiology  History of chronic diarrhea: Currently stable.  Continue with lomotil. Outpatient follow-up  History of hypertension: She is not on any medications at home  currently. Blood pressure intermittently on the higher side. Outpatient followup.  Arthritis:On naproxen.   CKD stage III: Kidney function on baseline.  Scalp laceration: Status post suturing .Can be removed in 7-10 days at skilled nursing facility.     Discharge Instructions  Discharge Instructions    Call MD for:  difficulty breathing, headache or visual disturbances   Complete by:  As directed    Call MD for:  extreme fatigue   Complete by:  As directed    Call MD for:  hives   Complete by:  As directed    Call MD for:  persistant dizziness or light-headedness   Complete by:  As directed    Call MD for:  persistant nausea and vomiting   Complete by:  As directed    Call MD for:  severe uncontrolled pain   Complete by:  As directed    Call MD for:  temperature >100.4   Complete by:  As directed    Diet - low sodium heart healthy   Complete by:  As directed    Discharge instructions   Complete by:  As directed    Activity as per orthopedics/PT recommendations Fall precautions   Increase activity slowly   Complete by:  As directed      Allergies as of 04/20/2017      Reactions   Hydromorphone    Pt does not remember   Lisinopril    fatigue   Mirtazapine    Pt doesn't remember.     Nitrofuran Derivatives Diarrhea   Septra [bactrim]  Pt doesn't remember.       Medication List    TAKE these medications   diphenoxylate-atropine 2.5-0.025 MG tablet Commonly known as:  LOMOTIL Take 2 tablets by mouth 3 (three) times daily before meals.   glycopyrrolate 1 MG tablet Commonly known as:  ROBINUL Take 1 mg by mouth 3 (three) times daily before meals.   naproxen sodium 220 MG tablet Commonly known as:  ALEVE Take 220 mg by mouth 2 (two) times daily as needed (pain).    oxyCODONE-acetaminophen 5-325 MG tablet Commonly known as:  PERCOCET Take 1 tablet by mouth every 6 (six) hours as needed for severe pain. What changed:    how much to take  when to take this  reasons to take this   paregoric 2 MG/5ML solution Take 5 mLs by mouth 3 (three) times daily before meals.       Contact information for follow-up providers    Netta Cedars, MD. Schedule an appointment as soon as possible for a visit in 1 week(s).   Specialty:  Orthopedic Surgery Contact information: [address] [place] [address] [phone]            Contact information for after-discharge care    Destination    [facility] Follow up.   Service:  Skilled Nursing Contact information: [place] [address] [phone]                 Allergies  Allergen Reactions  . Hydromorphone     Pt does not remember  . Lisinopril     fatigue  . Mirtazapine     Pt doesn't remember.    . Nitrofuran Derivatives Diarrhea  . Septra [Bactrim]     Pt doesn't remember.     Consultations:  Orthopedics   Procedures/Studies: Ct Head Wo Contrast  Result Date: 04/18/2017 CLINICAL DATA:  Weakness with several falls sustaining head lacerations. EXAM: CT HEAD WITHOUT CONTRAST CT CERVICAL SPINE WITHOUT CONTRAST TECHNIQUE: Multidetector CT imaging of the head and cervical spine was performed following the standard protocol without intravenous contrast. Multiplanar CT image reconstructions of the cervical spine were also generated. COMPARISON:  12/14/2016 and 06/05/2016 CT head exams. FINDINGS: CT HEAD FINDINGS Brain: Redemonstration of extra-axial hyperdense left frontal mass compatible with a non calcified meningioma. Given slice sampling differences, this mass measures approximately 2.4 x 1.9 cm currently. Slight localized mass effect on the adjacent brain without edema. No intracranial hemorrhage, midline shift or edema.  Chronic moderate small vessel ischemic disease and atrophy is otherwise noted. Midline fourth ventricle and basal cisterns. Vascular: No hyperdense vessels. Skull: No skull fracture. Stable mild scalp ossification anterior to the frontal sinus. Sinuses/Orbits: No acute finding. Other: Frontal as well as frontoparietal scalp contusions. The frontoparietal contusion also demonstrates soft tissue lacerations and an associated scalp hematoma. CT CERVICAL SPINE FINDINGS Alignment: Intact craniocervical relationship. Osteoarthritis of the atlantodental interval with joint space narrowing spurring. Maintained cervical lordosis. Skull base and vertebrae: No skull base fracture. No vertebral body fracture. Soft tissues and spinal canal: No prevertebral soft tissue swelling. No visible canal hematoma. Disc levels: Marked cervical spondylosis with degenerative disc disease, mild at C2-3 and moderate-to-marked from C3 through T1. Small posterior marginal osteophytes and uncovertebral joint osteoarthritis with uncinate spurring is noted from C3 through T1. Associated bilateral facet arthropathy is identified. These contribute to bilateral neural foraminal encroachment greatest at C3-4 on the left. Upper chest: Apical pleuroparenchymal scarring bilaterally. Other: None IMPRESSION: 1.  Frontal and frontoparietal scalp contusions. Associated scalp laceration and hematoma associated with the frontoparietal scalp contusion. No underlying skull fracture. 2. Chronic stable left frontal region meningioma with localized mass effect but no associated edema. 3. Chronic moderate small vessel ischemic disease of the brain. No acute intracranial abnormality. 4. Cervical spondylosis without acute cervical spine fracture. Electronically Signed   By: Ashley Royalty M.D.   On: 04/18/2017 22:41   Ct Cervical Spine Wo Contrast  Result Date: 04/18/2017 CLINICAL DATA:  Weakness with several falls sustaining head lacerations. EXAM: CT HEAD WITHOUT  CONTRAST CT CERVICAL SPINE WITHOUT CONTRAST TECHNIQUE: Multidetector CT imaging of the head and cervical spine was performed following the standard protocol without intravenous contrast. Multiplanar CT image reconstructions of the cervical spine were also generated. COMPARISON:  12/14/2016 and 06/05/2016 CT head exams. FINDINGS: CT HEAD FINDINGS Brain: Redemonstration of extra-axial hyperdense left frontal mass compatible with a non calcified meningioma. Given slice sampling differences, this mass measures approximately 2.4 x 1.9 cm currently. Slight localized mass effect on the adjacent brain without edema. No intracranial hemorrhage, midline shift or edema. Chronic moderate small vessel ischemic disease and atrophy is otherwise noted. Midline fourth ventricle and basal cisterns. Vascular: No hyperdense vessels. Skull: No skull fracture. Stable mild scalp ossification anterior to the frontal sinus. Sinuses/Orbits: No acute finding. Other: Frontal as well as frontoparietal scalp contusions. The frontoparietal contusion also demonstrates soft tissue lacerations and an associated scalp hematoma. CT CERVICAL SPINE FINDINGS Alignment: Intact craniocervical relationship. Osteoarthritis of the atlantodental interval with joint space narrowing spurring. Maintained cervical lordosis. Skull base and vertebrae: No skull base fracture. No vertebral body fracture. Soft tissues and spinal canal: No prevertebral soft tissue swelling. No visible canal hematoma. Disc levels: Marked cervical spondylosis with degenerative disc disease, mild at C2-3 and moderate-to-marked from C3 through T1. Small posterior marginal osteophytes and uncovertebral joint osteoarthritis with uncinate spurring is noted from C3 through T1. Associated bilateral facet arthropathy is identified. These contribute to bilateral neural foraminal encroachment greatest at C3-4 on the left. Upper chest: Apical pleuroparenchymal scarring bilaterally. Other: None  IMPRESSION: 1. Frontal and frontoparietal scalp contusions. Associated scalp laceration and hematoma associated with the frontoparietal scalp contusion. No underlying skull fracture. 2. Chronic stable left frontal region meningioma with localized mass effect but no associated edema. 3. Chronic moderate small vessel ischemic disease of the brain. No acute intracranial abnormality. 4. Cervical spondylosis without acute cervical spine fracture. Electronically Signed   By: Ashley Royalty M.D.   On: 04/18/2017 22:41   Dg Pelvis Portable  Result Date: 04/18/2017 CLINICAL DATA:  Bilateral hip reductions. EXAM: PORTABLE PELVIS 1-2 VIEWS COMPARISON:  04/18/2017 pre reduction radiographs. FINDINGS: Reduction of bilateral hip arthroplasties without hardware failure nor fracture. Lower lumbar degenerative disc and facet arthropathy. Bony pelvis appears intact. Surgical anchors project over the pubic symphysis. IMPRESSION: Satisfactory reduction of bilateral hip arthroplasties. No immediate post reduction complications. Electronically Signed   By: Ashley Royalty M.D.   On: 04/18/2017 23:25   Dg Chest Portable 1 View  Result Date: 04/18/2017 CLINICAL DATA:  Fall.  Fall in her bedroom, second fall downstairs. EXAM: PORTABLE CHEST 1 VIEW COMPARISON:  Radiograph 02/17/2014 FINDINGS: Chronic hyperinflation and bronchitic change. Unchanged heart size and mediastinal contours. No consolidation, pleural effusion or pneumothorax. Reverse left shoulder arthroplasty. Chronic change about the right shoulder. No acute osseous abnormalities are seen. IMPRESSION: Chronic hyperinflation and bronchial thickening, imaging findings consistent with COPD. No acute abnormality. Electronically Signed   By: Jeb Levering  M.D.   On: 04/18/2017 22:08   Dg Hips Bilat With Pelvis 3-4 Views  Result Date: 04/18/2017 CLINICAL DATA:  Bilateral hip pain after fall. Fall in her bedroom, second fall downstairs. EXAM: DG HIP (WITH OR WITHOUT PELVIS) 3-4V  BILAT COMPARISON:  None. FINDINGS: Bilateral hip arthroplasties with posterolateral dislocation of both femoral head components from the acetabular components. No fracture of either hip. The pubic rami are intact. Sacroiliac joints are congruent. The bones are under mineralized. IMPRESSION: Bilateral total hip arthroplasties with bilateral posterolateral dislocation of the femoral head components. No associated fracture. Electronically Signed   By: Jeb Levering M.D.   On: 04/18/2017 21:02    Subjective: Patient seen and examined at bedside. She denies any overnight fever, nausea or vomiting. Complains of mild hip pain.  Discharge Exam: Vitals:   04/19/17 2200 04/20/17 0426  BP: (!) 144/72 (!) 170/77  Pulse:  99  Resp:  18  Temp:  98.7 F (37.1 C)  SpO2:  94%   Vitals:   04/19/17 1219 04/19/17 2042 04/19/17 2200 04/20/17 0426  BP: (!) 149/93 (!) 168/85 (!) 144/72 (!) 170/77  Pulse: 95 95  99  Resp: 20 20  18   Temp: 98.5 F (36.9 C) 97.6 F (36.4 C)  98.7 F (37.1 C)  TempSrc: Oral Oral  Oral  SpO2: 98% 97%  94%  Weight:      Height:        General: Pt is alert, awake, not in acute distress Cardiovascular: Rate controlled, S1/S2 + Respiratory: Bilateral decreased breath sounds at bases Abdominal: Soft, NT, ND, bowel sounds + Extremities: no edema, no cyanosis    The results of significant diagnostics from this hospitalization (including imaging, microbiology, ancillary and laboratory) are listed below for reference.     Microbiology: No results found for this or any previous visit (from the past 240 hour(s)).   Labs: BNP (last 3 results) No results for input(s): BNP in the last 8760 hours. Basic Metabolic Panel: Recent Labs  Lab 04/18/17 2144 04/19/17 0720  NA 139 140  K 4.8 4.2  CL 105 107  CO2 23 23  GLUCOSE 139* 121*  BUN 30* 29*  CREATININE 1.24* 1.14*  CALCIUM 9.8 8.8*   Liver Function Tests: Recent Labs  Lab 04/18/17 2144  AST 46*  ALT 26   ALKPHOS 65  BILITOT 1.1  PROT 6.8  ALBUMIN 3.7   No results for input(s): LIPASE, AMYLASE in the last 168 hours. No results for input(s): AMMONIA in the last 168 hours. CBC: Recent Labs  Lab 04/18/17 2144 04/19/17 0720  WBC 16.0* 9.0  NEUTROABS 14.3*  --   HGB 11.7* 9.7*  HCT 36.6 29.3*  MCV 94.8 93.6  PLT 267 188   Cardiac Enzymes: Recent Labs  Lab 04/18/17 2201  04/19/17 1018 04/19/17 1325 04/19/17 1636 04/19/17 2256 04/20/17 0420  CKTOTAL 351*  --   --   --   --   --   --   TROPONINI <0.03   < > 0.31* 0.72* 0.89* 0.77* 0.83*   < > = values in this interval not displayed.   BNP: Invalid input(s): POCBNP CBG: No results for input(s): GLUCAP in the last 168 hours. D-Dimer No results for input(s): DDIMER in the last 72 hours. Hgb A1c No results for input(s): HGBA1C in the last 72 hours. Lipid Profile No results for input(s): CHOL, HDL, LDLCALC, TRIG, CHOLHDL, LDLDIRECT in the last 72 hours. Thyroid function studies No results for input(s): TSH,  T4TOTAL, T3FREE, THYROIDAB in the last 72 hours.  Invalid input(s): FREET3 Anemia work up No results for input(s): VITAMINB12, FOLATE, FERRITIN, TIBC, IRON, RETICCTPCT in the last 72 hours. Urinalysis    Component Value Date/Time   COLORURINE YELLOW 04/19/2017 1101   APPEARANCEUR HAZY (A) 04/19/2017 1101   LABSPEC 1.018 04/19/2017 1101   PHURINE 5.0 04/19/2017 1101   GLUCOSEU 50 (A) 04/19/2017 1101   HGBUR NEGATIVE 04/19/2017 1101   BILIRUBINUR NEGATIVE 04/19/2017 1101   KETONESUR 5 (A) 04/19/2017 1101   PROTEINUR 30 (A) 04/19/2017 1101   UROBILINOGEN 0.2 10/30/2014 1728   NITRITE NEGATIVE 04/19/2017 1101   LEUKOCYTESUR TRACE (A) 04/19/2017 1101   Sepsis Labs Invalid input(s): PROCALCITONIN,  WBC,  LACTICIDVEN Microbiology No results found for this or any previous visit (from the past 240 hour(s)).   Time coordinating discharge: 40 minutes  SIGNED:   Aline August, MD  [facility] 04/20/2017,  11:19 AM Pager: [phone]  If 7PM-7AM, please contact night-coverage [email or web address] Password [email or web address]

## 2017-04-20 NOTE — Clinical Social Work Placement (Signed)
Patient received and accepted bed offer at [facility]. Facility aware of patient's discharge and confirmed bed offer. [facility] contacted, patient aware. Patient's RN can call report to [phone], packet complete. CSW signing off, no other needs identified at this time.  CLINICAL SOCIAL WORK PLACEMENT  NOTE  Date:  04/20/2017  Patient Details  Name: [name] MRN: [id number] Date of Birth: [birth year]  Clinical Social Work is seeking post-discharge placement for this patient at the [place] level of care (*CSW will initial, date and re-position this form in  chart as items are completed):  Yes   Patient/family provided with [facility] Work Department's list of facilities offering this level of care within the geographic area requested by the patient (or if unable, by the patient's family).  Yes   Patient/family informed of their freedom to choose among providers that offer the needed level of care, that participate in Medicare, Medicaid or managed care program needed by the patient, have an available bed and are willing to accept the patient.  Yes   Patient/family informed of [facility]'s ownership interest in [facility] and [facility], as well as of the fact that they are under no obligation to receive care at these facilities.  PASRR submitted to EDS on       PASRR number received on       Existing PASRR number confirmed on 04/19/17     FL2 transmitted to all facilities in geographic area requested by pt/family on 04/19/17     FL2 transmitted to all facilities within larger geographic area on       Patient informed that his/her managed care company has contracts with or will negotiate with certain facilities, including the following:  [employer]     Yes   Patient/family informed of bed offers received.  Patient chooses bed at [facility]     Physician recommends and patient chooses bed at [facility]    Patient to be  transferred to [facility] on 04/20/17.  Patient to be transferred to facility by [facility]     Patient family notified on 04/20/17 of transfer.  Name of family member notified:  pt states she will notify family and neighbor     PHYSICIAN       Additional Comment:    _______________________________________________ Burnis Medin, LCSW 04/20/2017, 2:01 PM

## 2017-05-15 IMAGING — XA DG FLUORO GUIDE NDL PLC/BX
1 series · 1 of 1 positions shown · non-contrast
Comparison: none

CLINICAL DATA: Iliopsoas bursitis.  Groin pain.

[Series 1: ortho adipose · 1 of 1 slices shown]
[im 1/1]
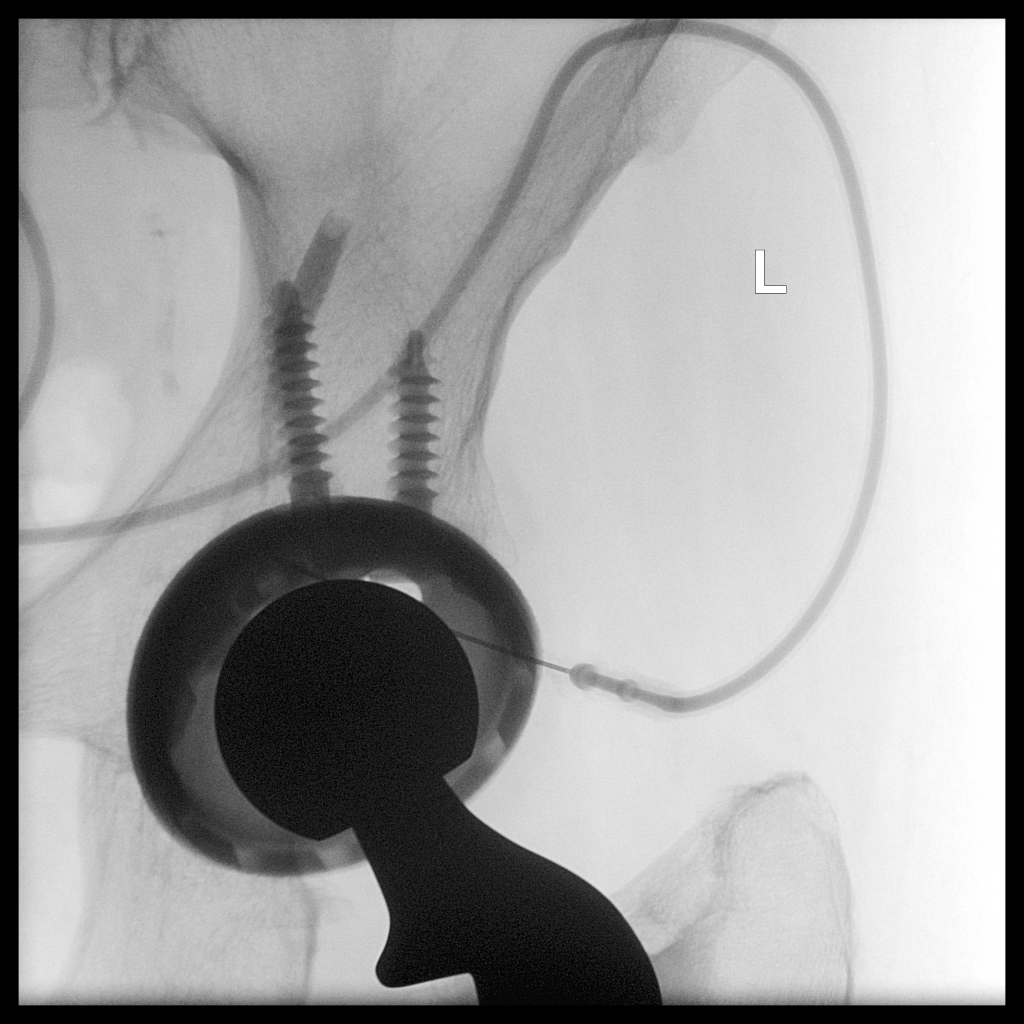

[1 of 1 positions shown; findings below may reference images not displayed]

FLUOROSCOPY TIME:  15 seconds corresponding to a Dose Area Product
of 21 ?Gy*m2

PROCEDURE:
RIGHT ILIOPSOAS BURSA INJECTION UNDER FLUOROSCOPY

Informed written consent was obtained.  Time-out was performed.

An appropriate skin entrance site was determined. The site was
marked, prepped with Betadine, draped in the usual sterile fashion,
and infiltrated locally with 1% Lidocaine. 22 gauge spinal needle
was advanced to the iliopsoas bursa using loss of resistance
technique. Care was taken to avoid the neurovascular bundle for the
femoral nerve, artery and vein. 1 ml of Lidocaine injected easily.
Contrast injection confirmed intra bursal spread superiorly. I
injected 120 mg Depo-Medrol along with 2 mL 1% lidocaine.
IMPRESSION: Technically successful LEFT iliopsoas bursa injection.

## 2018-02-05 ENCOUNTER — Encounter (HOSPITAL_BASED_OUTPATIENT_CLINIC_OR_DEPARTMENT_OTHER): Payer: Medicare Other | Attending: Internal Medicine

## 2018-02-05 DIAGNOSIS — I10 Essential (primary) hypertension: Secondary | ICD-10-CM | POA: Diagnosis not present

## 2018-02-05 DIAGNOSIS — Z96653 Presence of artificial knee joint, bilateral: Secondary | ICD-10-CM | POA: Diagnosis not present

## 2018-02-05 DIAGNOSIS — Z96643 Presence of artificial hip joint, bilateral: Secondary | ICD-10-CM | POA: Diagnosis not present

## 2018-02-05 DIAGNOSIS — L97812 Non-pressure chronic ulcer of other part of right lower leg with fat layer exposed: Secondary | ICD-10-CM | POA: Insufficient documentation

## 2018-02-05 DIAGNOSIS — Z96612 Presence of left artificial shoulder joint: Secondary | ICD-10-CM | POA: Insufficient documentation

## 2018-02-05 DIAGNOSIS — Z9049 Acquired absence of other specified parts of digestive tract: Secondary | ICD-10-CM | POA: Diagnosis not present

## 2018-02-12 DIAGNOSIS — L97812 Non-pressure chronic ulcer of other part of right lower leg with fat layer exposed: Secondary | ICD-10-CM | POA: Diagnosis not present

## 2018-02-21 DIAGNOSIS — L97812 Non-pressure chronic ulcer of other part of right lower leg with fat layer exposed: Secondary | ICD-10-CM | POA: Diagnosis not present

## 2018-02-28 ENCOUNTER — Encounter (HOSPITAL_BASED_OUTPATIENT_CLINIC_OR_DEPARTMENT_OTHER): Payer: Medicare Other | Attending: Internal Medicine

## 2018-02-28 DIAGNOSIS — I10 Essential (primary) hypertension: Secondary | ICD-10-CM | POA: Insufficient documentation

## 2018-02-28 DIAGNOSIS — G629 Polyneuropathy, unspecified: Secondary | ICD-10-CM | POA: Diagnosis not present

## 2018-02-28 DIAGNOSIS — L97812 Non-pressure chronic ulcer of other part of right lower leg with fat layer exposed: Secondary | ICD-10-CM | POA: Diagnosis not present

## 2018-03-07 DIAGNOSIS — L97812 Non-pressure chronic ulcer of other part of right lower leg with fat layer exposed: Secondary | ICD-10-CM | POA: Diagnosis not present

## 2018-03-14 DIAGNOSIS — L97812 Non-pressure chronic ulcer of other part of right lower leg with fat layer exposed: Secondary | ICD-10-CM | POA: Diagnosis not present

## 2018-03-21 DIAGNOSIS — L97812 Non-pressure chronic ulcer of other part of right lower leg with fat layer exposed: Secondary | ICD-10-CM | POA: Diagnosis not present

## 2018-03-28 DIAGNOSIS — L97812 Non-pressure chronic ulcer of other part of right lower leg with fat layer exposed: Secondary | ICD-10-CM | POA: Diagnosis not present

## 2018-12-19 ENCOUNTER — Inpatient Hospital Stay (HOSPITAL_COMMUNITY)
Admission: EM | Admit: 2018-12-19 | Discharge: 2018-12-24 | DRG: 683 | Disposition: A | Discharge status: Skilled Nursing Facility | Payer: Medicare Other | Attending: Internal Medicine | Admitting: Internal Medicine

## 2018-12-19 DIAGNOSIS — M810 Age-related osteoporosis without current pathological fracture: Secondary | ICD-10-CM | POA: Diagnosis present

## 2018-12-19 DIAGNOSIS — Z66 Do not resuscitate: Secondary | ICD-10-CM | POA: Diagnosis present

## 2018-12-19 DIAGNOSIS — R824 Acetonuria: Secondary | ICD-10-CM | POA: Diagnosis present

## 2018-12-19 DIAGNOSIS — Z6821 Body mass index (BMI) 21.0-21.9, adult: Secondary | ICD-10-CM

## 2018-12-19 DIAGNOSIS — Z96612 Presence of left artificial shoulder joint: Secondary | ICD-10-CM | POA: Diagnosis present

## 2018-12-19 DIAGNOSIS — K219 Gastro-esophageal reflux disease without esophagitis: Secondary | ICD-10-CM | POA: Diagnosis present

## 2018-12-19 DIAGNOSIS — D329 Benign neoplasm of meninges, unspecified: Secondary | ICD-10-CM | POA: Diagnosis present

## 2018-12-19 DIAGNOSIS — Z87891 Personal history of nicotine dependence: Secondary | ICD-10-CM

## 2018-12-19 DIAGNOSIS — Y92009 Unspecified place in unspecified non-institutional (private) residence as the place of occurrence of the external cause: Secondary | ICD-10-CM

## 2018-12-19 DIAGNOSIS — E876 Hypokalemia: Secondary | ICD-10-CM | POA: Diagnosis not present

## 2018-12-19 DIAGNOSIS — Z8249 Family history of ischemic heart disease and other diseases of the circulatory system: Secondary | ICD-10-CM

## 2018-12-19 DIAGNOSIS — W19XXXA Unspecified fall, initial encounter: Secondary | ICD-10-CM | POA: Diagnosis present

## 2018-12-19 DIAGNOSIS — N390 Urinary tract infection, site not specified: Secondary | ICD-10-CM | POA: Diagnosis present

## 2018-12-19 DIAGNOSIS — Z96643 Presence of artificial hip joint, bilateral: Secondary | ICD-10-CM | POA: Diagnosis present

## 2018-12-19 DIAGNOSIS — Z79899 Other long term (current) drug therapy: Secondary | ICD-10-CM

## 2018-12-19 DIAGNOSIS — G629 Polyneuropathy, unspecified: Secondary | ICD-10-CM | POA: Diagnosis present

## 2018-12-19 DIAGNOSIS — R531 Weakness: Secondary | ICD-10-CM

## 2018-12-19 DIAGNOSIS — Z96653 Presence of artificial knee joint, bilateral: Secondary | ICD-10-CM | POA: Diagnosis present

## 2018-12-19 DIAGNOSIS — J449 Chronic obstructive pulmonary disease, unspecified: Secondary | ICD-10-CM | POA: Diagnosis present

## 2018-12-19 DIAGNOSIS — K529 Noninfective gastroenteritis and colitis, unspecified: Secondary | ICD-10-CM | POA: Diagnosis present

## 2018-12-19 DIAGNOSIS — L89152 Pressure ulcer of sacral region, stage 2: Secondary | ICD-10-CM | POA: Diagnosis present

## 2018-12-19 DIAGNOSIS — L8990 Pressure ulcer of unspecified site, unspecified stage: Secondary | ICD-10-CM | POA: Diagnosis present

## 2018-12-19 DIAGNOSIS — R64 Cachexia: Secondary | ICD-10-CM | POA: Diagnosis present

## 2018-12-19 DIAGNOSIS — N179 Acute kidney failure, unspecified: Principal | ICD-10-CM | POA: Diagnosis present

## 2018-12-19 DIAGNOSIS — R262 Difficulty in walking, not elsewhere classified: Secondary | ICD-10-CM | POA: Diagnosis present

## 2018-12-19 DIAGNOSIS — D649 Anemia, unspecified: Secondary | ICD-10-CM | POA: Diagnosis present

## 2018-12-19 DIAGNOSIS — Z20828 Contact with and (suspected) exposure to other viral communicable diseases: Secondary | ICD-10-CM | POA: Diagnosis present

## 2018-12-19 DIAGNOSIS — Z85820 Personal history of malignant melanoma of skin: Secondary | ICD-10-CM

## 2018-12-19 DIAGNOSIS — M1990 Unspecified osteoarthritis, unspecified site: Secondary | ICD-10-CM | POA: Diagnosis present

## 2018-12-19 DIAGNOSIS — E860 Dehydration: Secondary | ICD-10-CM | POA: Diagnosis present

## 2018-12-19 DIAGNOSIS — F329 Major depressive disorder, single episode, unspecified: Secondary | ICD-10-CM | POA: Diagnosis present

## 2018-12-19 DIAGNOSIS — Z885 Allergy status to narcotic agent status: Secondary | ICD-10-CM

## 2018-12-19 DIAGNOSIS — I10 Essential (primary) hypertension: Secondary | ICD-10-CM | POA: Diagnosis present

## 2018-12-19 DIAGNOSIS — Z79891 Long term (current) use of opiate analgesic: Secondary | ICD-10-CM

## 2018-12-19 NOTE — ED Triage Notes (Signed)
Pt here from home after fall. Pt reportedly left on ground for 3 days before friends came and checked on patient. EMS called initially but patient refused. An hour later patient fell again.

## 2018-12-20 ENCOUNTER — Other Ambulatory Visit: Payer: Self-pay

## 2018-12-20 ENCOUNTER — Emergency Department (HOSPITAL_COMMUNITY): Payer: Medicare Other

## 2018-12-20 ENCOUNTER — Observation Stay (HOSPITAL_COMMUNITY): Payer: Medicare Other

## 2018-12-20 ENCOUNTER — Encounter (HOSPITAL_COMMUNITY): Payer: Self-pay

## 2018-12-20 DIAGNOSIS — D329 Benign neoplasm of meninges, unspecified: Secondary | ICD-10-CM | POA: Diagnosis present

## 2018-12-20 DIAGNOSIS — R531 Weakness: Secondary | ICD-10-CM | POA: Diagnosis not present

## 2018-12-20 DIAGNOSIS — N179 Acute kidney failure, unspecified: Secondary | ICD-10-CM | POA: Diagnosis present

## 2018-12-20 DIAGNOSIS — L8990 Pressure ulcer of unspecified site, unspecified stage: Secondary | ICD-10-CM | POA: Diagnosis present

## 2018-12-20 LAB — CBC WITH DIFFERENTIAL/PLATELET
Abs Immature Granulocytes: 0.06 10*3/uL (ref 0.00–0.07)
Basophils Absolute: 0 10*3/uL (ref 0.0–0.1)
Basophils Relative: 0 %
Eosinophils Absolute: 0 10*3/uL (ref 0.0–0.5)
Eosinophils Relative: 0 %
HCT: 43.6 % (ref 36.0–46.0)
Hemoglobin: 14.1 g/dL (ref 12.0–15.0)
Immature Granulocytes: 1 %
Lymphocytes Relative: 3 %
Lymphs Abs: 0.4 10*3/uL — ABNORMAL LOW (ref 0.7–4.0)
MCH: 30.2 pg (ref 26.0–34.0)
MCHC: 32.3 g/dL (ref 30.0–36.0)
MCV: 93.4 fL (ref 80.0–100.0)
Monocytes Absolute: 0.8 10*3/uL (ref 0.1–1.0)
Monocytes Relative: 7 %
Neutro Abs: 11.3 10*3/uL — ABNORMAL HIGH (ref 1.7–7.7)
Neutrophils Relative %: 89 %
Platelets: 268 10*3/uL (ref 150–400)
RBC: 4.67 MIL/uL (ref 3.87–5.11)
RDW: 13.3 % (ref 11.5–15.5)
WBC: 12.6 10*3/uL — ABNORMAL HIGH (ref 4.0–10.5)
nRBC: 0 % (ref 0.0–0.2)

## 2018-12-20 LAB — URINALYSIS, ROUTINE W REFLEX MICROSCOPIC
Glucose, UA: 100 mg/dL — AB
Ketones, ur: 15 mg/dL — AB
Nitrite: POSITIVE — AB
Protein, ur: 100 mg/dL — AB
Specific Gravity, Urine: 1.015 (ref 1.005–1.030)
pH: 8 (ref 5.0–8.0)

## 2018-12-20 LAB — URINALYSIS, MICROSCOPIC (REFLEX)

## 2018-12-20 LAB — COMPREHENSIVE METABOLIC PANEL
ALT: 39 U/L (ref 0–44)
AST: 69 U/L — ABNORMAL HIGH (ref 15–41)
Albumin: 3.8 g/dL (ref 3.5–5.0)
Alkaline Phosphatase: 75 U/L (ref 38–126)
Anion gap: 18 — ABNORMAL HIGH (ref 5–15)
BUN: 43 mg/dL — ABNORMAL HIGH (ref 8–23)
CO2: 22 mmol/L (ref 22–32)
Calcium: 9.9 mg/dL (ref 8.9–10.3)
Chloride: 100 mmol/L (ref 98–111)
Creatinine, Ser: 1.5 mg/dL — ABNORMAL HIGH (ref 0.44–1.00)
GFR calc Af Amer: 36 mL/min — ABNORMAL LOW (ref >60)
GFR calc non Af Amer: 31 mL/min — ABNORMAL LOW (ref >60)
Glucose, Bld: 107 mg/dL — ABNORMAL HIGH (ref 70–99)
Potassium: 3.8 mmol/L (ref 3.5–5.1)
Sodium: 140 mmol/L (ref 135–145)
Total Bilirubin: 1.4 mg/dL — ABNORMAL HIGH (ref 0.3–1.2)
Total Protein: 7.1 g/dL (ref 6.5–8.1)

## 2018-12-20 LAB — SARS CORONAVIRUS 2 (TAT 6-24 HRS): SARS Coronavirus 2: NEGATIVE

## 2018-12-20 LAB — CBG MONITORING, ED: Glucose-Capillary: 102 mg/dL — ABNORMAL HIGH (ref 70–99)

## 2018-12-20 LAB — CK: Total CK: 870 U/L — ABNORMAL HIGH (ref 38–234)

## 2018-12-20 MED ORDER — SODIUM CHLORIDE 0.9 % IV BOLUS (SEPSIS)
500.0000 mL | Freq: Once | INTRAVENOUS | Status: AC
  Administered 2018-12-20: 500 mL via INTRAVENOUS

## 2018-12-20 MED ORDER — HYDRALAZINE HCL 20 MG/ML IJ SOLN
5.0000 mg | INTRAMUSCULAR | Status: DC | PRN
  Administered 2018-12-20 (×2): 5 mg via INTRAVENOUS
  Filled 2018-12-20 (×2): qty 1

## 2018-12-20 MED ORDER — ONDANSETRON HCL 4 MG PO TABS: 4.0000 mg | ORAL_TABLET | Freq: Four times a day (QID) | ORAL | Status: DC | PRN

## 2018-12-20 MED ORDER — MIRABEGRON ER 50 MG PO TB24
50.0000 mg | ORAL_TABLET | Freq: Every day | ORAL | Status: DC
  Filled 2018-12-20: qty 1

## 2018-12-20 MED ORDER — ACETAMINOPHEN 325 MG PO TABS: 650.0000 mg | ORAL_TABLET | Freq: Four times a day (QID) | ORAL | Status: DC | PRN

## 2018-12-20 MED ORDER — GLYCOPYRROLATE 1 MG PO TABS
1.0000 mg | ORAL_TABLET | Freq: Three times a day (TID) | ORAL | Status: DC
  Filled 2018-12-20 (×2): qty 1

## 2018-12-20 MED ORDER — ACETAMINOPHEN 650 MG RE SUPP: 650.0000 mg | Freq: Four times a day (QID) | RECTAL | Status: DC | PRN

## 2018-12-20 MED ORDER — ENOXAPARIN SODIUM 40 MG/0.4ML ~~LOC~~ SOLN
40.0000 mg | SUBCUTANEOUS | Status: DC
  Administered 2018-12-20 – 2018-12-24 (×5): 40 mg via SUBCUTANEOUS
  Filled 2018-12-20 (×5): qty 0.4

## 2018-12-20 MED ORDER — ONDANSETRON HCL 4 MG/2ML IJ SOLN: 4.0000 mg | Freq: Four times a day (QID) | INTRAMUSCULAR | Status: DC | PRN

## 2018-12-20 MED ORDER — DIPHENOXYLATE-ATROPINE 2.5-0.025 MG PO TABS: 2.0000 | ORAL_TABLET | Freq: Three times a day (TID) | ORAL | Status: DC

## 2018-12-20 MED ORDER — SODIUM CHLORIDE 0.9 % IV SOLN
INTRAVENOUS | Status: DC
  Administered 2018-12-20 – 2018-12-21 (×3): via INTRAVENOUS

## 2018-12-20 MED ORDER — SODIUM CHLORIDE 0.9 % IV SOLN
1.0000 g | INTRAVENOUS | Status: AC
  Administered 2018-12-20 – 2018-12-22 (×3): 1 g via INTRAVENOUS
  Filled 2018-12-20: qty 10
  Filled 2018-12-20 (×2): qty 1

## 2018-12-20 NOTE — ED Notes (Signed)
Report given to 5M RN.  

## 2018-12-20 NOTE — Consult Note (Signed)
[place] Nurse wound consult note Consultation was completed by review of records, images and assistance from the bedside nurse/clinical staff.   Reason for Consult: pressure injury MD reports to [place] Stage 1 pressure injury per staff. Contacted bedside nurse, she has added to  Nursing FS Wound type: Stage 2 Pressure Injury Pressure Injury POA: Yes Below per bedside nurse communication Measurement: 2cm x 0.7cm x0 Wound bed: clean and pink Drainage (amount, consistency, odor) none Periwound: intact  Dressing procedure/placement/frequency: Silicone foam implemented per skin care order set. Will update orders.    Discussed POC with patient and bedside nurse.  Re consult if needed, will not follow at this time. Thanks  Rufus Beske [employer], RN,CWOCN, CNS, [place] [phone])

## 2018-12-20 NOTE — ED Notes (Signed)
Pt refused to ambulate as well as give consent for in/out cath.

## 2018-12-20 NOTE — H&P (Addendum)
History and Physical    TANVI GATLING LGX:[id number] DOB: Jul 23, 1930 DOA: 12/19/2018  PCP: Lajean Manes, MD Consultants:  None Patient coming from:  Home - lives alone; NOK: [place], [place], [place]  Chief Complaint: Fall  HPI: [name] is a 85 y.o. female with medical history significant of HTN; depression; and COPD presenting after a fall.  Her son reports that her friend called, she wasn't answering the phone in 3 days.  He lives in [place] and they don't talk frequently.  He suggested that they call 911 for a welfare check.  She was found down for 2 days, unable to get up.  She didn't go to the hospital the first time but they came back out again and transported her.  They have a "consistent argument about the fact that she doesn't eat or drink."  She becomes incoherent when she doesn't eat.  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  He would guess that she would not want heroics/resuscitation.  He expects that she will be very difficult when she is awake/alert.  She has issues with hoarding, will likely refuse placement or [place] services.  "She just has no business living alone.  She's been in rehab before.... She needs to be with someone."    ED Course:  Carryover, per Dr. Hal Hope:  Patient was brought in after patient had a fall at home and was lying on the floor for 2 days. In the ER patient appears dehydrated with creatinine increased from baseline was given fluids. Patient is unable to walk and unable to walk without assistance. Patient also has some slurred speech for which MRI brain is being ordered. CT head was unremarkable. Patient likely may need rehab.   Review of Systems: Unable to perform due to AMS   Ambulatory Status:  Ambulates without assistance  Past Medical History:  Diagnosis Date  . Anemia   . Arthritis   . Cancer ([place])    melanoma  . Chronic diarrhea   .  Complication of anesthesia    hallucinations in the past, last surgery was OK anesth.   Marland Kitchen COPD (chronic obstructive pulmonary disease) ([place])   . 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  Marland Kitchen GERD (gastroesophageal reflux disease)   . Goiter   . Headache(784.0)    sinus  . History of blood transfusion 2006   due to colon bleeding   . Hypertension   . Neuromuscular disorder ([place])    peripheral neuropathy - both feet   . Osteoporosis   . Shortness of breath dyspnea   . UTI (lower urinary tract infection)    start Tx today /w probiotic- 08/18/2014     Past Surgical History:  Procedure Laterality Date  . ABDOMINAL HYSTERECTOMY    . APPENDECTOMY    . BALLOON DILATION  04/19/2011   Procedure: BALLOON DILATION;  Surgeon: Landry Dyke, MD;  Location: [facility];  Service: Endoscopy;  Laterality: N/A;  . BLADDER SURGERY    . COLON SURGERY  2006   Dr. Zella Richer- due to diverticulitis  . ESOPHAGOGASTRODUODENOSCOPY  04/19/2011   Procedure: ESOPHAGOGASTRODUODENOSCOPY (EGD);  Surgeon: Landry Dyke, MD;  Location: [facility];  Service: Endoscopy;  Laterality: N/A;  mac   . hip replacement-bilateral    . JOINT REPLACEMENT    . REVERSE SHOULDER ARTHROPLASTY Left 08/27/2014   Procedure: REVERSE LEFT  SHOULDER ARTHROPLASTY;  Surgeon: Justice Britain, MD;  Location: [facility];  Service: Orthopedics;  Laterality: Left;  . TOTAL KNEE ARTHROPLASTY Bilateral     Social History   Socioeconomic History  . Marital status: Divorced    Spouse name: Not on file  . Number of children: Not on file  . Years of education: Not on file  . Highest education level: Not on file  Occupational History  . Not on file  Social Needs  . Financial resource strain: Not on file  . Food insecurity    Worry: Not on file    Inability: Not on file  . Transportation needs    Medical: Not on file    Non-medical: Not on file  Tobacco Use  . Smoking status: Former Smoker    Quit date: 02/28/1968     Years since quitting: 50.8  Substance and Sexual Activity  . Alcohol use: Yes    Comment: rarely  . Drug use: No  . Sexual activity: Not on file  Lifestyle  . Physical activity    Days per week: Not on file    Minutes per session: Not on file  . Stress: Not on file  Relationships  . Social Herbalist on phone: Not on file    Gets together: Not on file    Attends religious service: Not on file    Active member of club or organization: Not on file    Attends meetings of clubs or organizations: Not on file    Relationship status: Not on file  . Intimate partner violence    Fear of current or ex partner: Not on file    Emotionally abused: Not on file    Physically abused: Not on file    Forced sexual activity: Not on file  Other Topics Concern  . Not on file  Social History Narrative  . Not on file    Allergies  Allergen Reactions  . Hydromorphone     Pt does not remember  . Lisinopril     fatigue  . Mirtazapine     Pt doesn't remember.    . Nitrofuran Derivatives Diarrhea  . Septra [Bactrim]     Pt doesn't remember.     Family History  Problem Relation Age of Onset  . Heart attack Mother   . Stroke Father     Prior to Admission medications   Medication Sig Start Date End Date Taking? Authorizing Provider  diphenoxylate-atropine (LOMOTIL) 2.5-0.025 MG tablet Take 2 tablets by mouth 3 (three) times daily before meals. 04/20/17   Aline August, MD  glycopyrrolate (ROBINUL) 1 MG tablet Take 1 mg by mouth 3 (three) times daily before meals.     [provider]  naproxen sodium (ALEVE) 220 MG tablet Take 220 mg by mouth 2 (two) times daily as needed (pain).    [provider]  oxyCODONE-acetaminophen (PERCOCET) 5-325 MG tablet Take 1 tablet by mouth every 6 (six) hours as needed for severe pain. 04/20/17   Aline August, MD  paregoric 2 MG/5ML solution Take 5 mLs by mouth 3 (three) times daily before meals.    [provider]     Physical Exam: Vitals:   12/20/18 1158 12/20/18 1200 12/20/18 1230 12/20/18 1300  BP: (!) 154/87 (!) 164/83 (!) 167/90 (!) 157/84  Pulse: 92 86 87 88  Resp: 16 16 (!) 28 16  Temp:      TempSrc:      SpO2: 99% 98%  96% 97%     . General:  Somnolent, minimally responsive to questions although awakens easily . Eyes:  PERRL, EOMI, normal lids, iris . ENT:  grossly normal hearing, lips & tongue, dry mm . Neck:  no LAD, masses or thyromegaly . Cardiovascular:  RRR, no m/r/g. No LE edema.  Marland Kitchen Respiratory:   CTA bilaterally with no wheezes/rales/rhonchi.  Normal respiratory effort. . Abdomen:  soft, NT, ND, NABS . Back:   normal alignment, no CVAT . Skin:  no rash or induration seen on limited exam; pressure ulcer noted by nursing staff - stage 1 sacral . Musculoskeletal:  grossly normal tone BUE/BLE, no bony abnormality . Psychiatric:  Somnolent mood and affect, speech slow and dysarthric . Neurologic:  Unable to perform    Radiological Exams on Admission: Ct Head Wo Contrast  Result Date: 12/20/2018 CLINICAL DATA:  Fall at home. EXAM: CT HEAD WITHOUT CONTRAST CT CERVICAL SPINE WITHOUT CONTRAST TECHNIQUE: Multidetector CT imaging of the head and cervical spine was performed following the standard protocol without intravenous contrast. Multiplanar CT image reconstructions of the cervical spine were also generated. COMPARISON:  04/19/2017, 06/05/2016 CT FINDINGS: CT HEAD FINDINGS Brain: No intracranial hemorrhage. Extra-axial left frontal hyperdense mass measuring 2.7 x 2.1 x 2.8 cm has slightly increased in size from prior exam where it measured 2.4 x 1.9 x 2.1 cm. Similar localized mass-effect on brain parenchyma. No midline shift. Age related atrophy. Chronic small vessel ischemia stable. 3 No hydrocephalus. The basilar cisterns are patent. No evidence of territorial infarct or acute ischemia. No extra-axial or intracranial fluid collection. Vascular: Atherosclerosis of skullbase  vasculature without hyperdense vessel or abnormal calcification. Skull: No fracture or focal lesion. Small frontal calcified sebaceous cyst in the midline versus small exostosis. This is unchanged. Sinuses/Orbits: Paranasal sinuses and mastoid air cells are clear. The visualized orbits are unremarkable. Other: None. CT CERVICAL SPINE FINDINGS Alignment: Normal. Skull base and vertebrae: No acute fracture. Vertebral body heights are maintained. The dens and skull base are intact. Soft tissues and spinal canal: No prevertebral fluid or swelling. No visible canal hematoma. Disc levels: Diffuse degenerative disc disease with disc space narrowing and endplate spurring. Multilevel facet hypertrophy. There is bony canal narrowing at the level of C5-C6 and to a lesser extent C6-C7. multilevel neural foraminal stenosis. Upper chest: No acute finding. Mild biapical pleuroparenchymal scarring. Other: Both shoulders included on the coronal reformats. Reverse left shoulder arthroplasty. High-riding humeral head abutting the undersurface of the acromion with associated degenerative change. IMPRESSION: 1. No acute intracranial abnormality. No skull fracture. 2. Known left frontal extra-axial hyperdense mass is most consistent with a meningioma, however has mildly increased in size since 2018 CT currently measuring 2.7 x 2.1 x 2.8 cm. 3. Multilevel degenerative change throughout the cervical spine without acute fracture or subluxation. Electronically Signed   By: Keith Rake M.D.   On: 12/20/2018 01:16   Ct Cervical Spine Wo Contrast  Result Date: 12/20/2018 CLINICAL DATA:  Fall at home. EXAM: CT HEAD WITHOUT CONTRAST CT CERVICAL SPINE WITHOUT CONTRAST TECHNIQUE: Multidetector CT imaging of the head and cervical spine was performed following the standard protocol without intravenous contrast. Multiplanar CT image reconstructions of the cervical spine were also generated. COMPARISON:  04/19/2017, 06/05/2016 CT FINDINGS: CT  HEAD FINDINGS Brain: No intracranial hemorrhage. Extra-axial left frontal hyperdense mass measuring 2.7 x 2.1 x 2.8 cm has slightly increased in size from prior exam where it measured 2.4 x 1.9 x 2.1 cm. Similar localized mass-effect on brain  parenchyma. No midline shift. Age related atrophy. Chronic small vessel ischemia stable. 3 No hydrocephalus. The basilar cisterns are patent. No evidence of territorial infarct or acute ischemia. No extra-axial or intracranial fluid collection. Vascular: Atherosclerosis of skullbase vasculature without hyperdense vessel or abnormal calcification. Skull: No fracture or focal lesion. Small frontal calcified sebaceous cyst in the midline versus small exostosis. This is unchanged. Sinuses/Orbits: Paranasal sinuses and mastoid air cells are clear. The visualized orbits are unremarkable. Other: None. CT CERVICAL SPINE FINDINGS Alignment: Normal. Skull base and vertebrae: No acute fracture. Vertebral body heights are maintained. The dens and skull base are intact. Soft tissues and spinal canal: No prevertebral fluid or swelling. No visible canal hematoma. Disc levels: Diffuse degenerative disc disease with disc space narrowing and endplate spurring. Multilevel facet hypertrophy. There is bony canal narrowing at the level of C5-C6 and to a lesser extent C6-C7. multilevel neural foraminal stenosis. Upper chest: No acute finding. Mild biapical pleuroparenchymal scarring. Other: Both shoulders included on the coronal reformats. Reverse left shoulder arthroplasty. High-riding humeral head abutting the undersurface of the acromion with associated degenerative change. IMPRESSION: 1. No acute intracranial abnormality. No skull fracture. 2. Known left frontal extra-axial hyperdense mass is most consistent with a meningioma, however has mildly increased in size since 2018 CT currently measuring 2.7 x 2.1 x 2.8 cm. 3. Multilevel degenerative change throughout the cervical spine without acute  fracture or subluxation. Electronically Signed   By: Keith Rake M.D.   On: 12/20/2018 01:16   Mr Brain Wo Contrast  Result Date: 12/20/2018 CLINICAL DATA:  Focal neuro deficit with stroke suspected EXAM: MRI HEAD WITHOUT CONTRAST TECHNIQUE: Multiplanar, multiecho pulse sequences of the brain and surrounding structures were obtained without intravenous contrast. COMPARISON:  Head CT from earlier today FINDINGS: Brain: No acute infarction, hemorrhage, hydrocephalus, extra-axial collection. Confluent FLAIR hyperintensity in the cerebral white matter attributed to chronic small vessel ischemia. Small vessel ischemic type changes are also seen in the deep gray nuclei, especially the bilateral thalamus, and to a mild extent in the pons. Remote micro hemorrhages in the deep brain and bilateral cerebellum, a hypertensive pattern. Small remote left cerebellar infarct. Known extra-axial mass along the high and lateral left frontal lobe measuring 3.1 cm. The mass has enlarged since 2018 head CT when it measured 2 cm. No adjacent brain edema Vascular: Normal arterial flow voids. Likely incidental loss of flow void in the right sigmoid and transverse sinuses, often attributed to venous impingement in the neck. Skull and upper cervical spine: No focal marrow lesion. C2-3 and C3-4 disc and facet degeneration. Probable osteoma in the central forehead. Sinuses/Orbits: Negative IMPRESSION: 1. No emergent finding. 2. Extensive chronic small vessel ischemia with remote hypertensive pattern micro hemorrhages. 3. 3.1 cm meningioma along the high left frontal convexity which has enlarged from 2 cm in 2018. No adjacent brain edema. Electronically Signed   By: Monte Fantasia M.D.   On: 12/20/2018 08:11   Dg Chest Portable 1 View  Result Date: 12/20/2018 CLINICAL DATA:  Fall. EXAM: PORTABLE CHEST 1 VIEW COMPARISON:  04/18/2017 FINDINGS: Chronic hyperinflation. Biapical pleural parenchymal scarring. Unchanged heart size and  mediastinal contours with aortic atherosclerosis. No acute airspace disease. Multiple skin folds project over the both hemi thoraces, no pneumothorax. No large pleural effusion. No pulmonary edema. Left shoulder arthroplasty. Chronic right rotator cuff arthropathy. No acute osseous abnormalities are seen. IMPRESSION: 1. No acute abnormality or change from prior exam. 2. Stable chronic hyperinflation and biapical pleural parenchymal scarring. 3.  Aortic Atherosclerosis (ICD10-I70.0). Electronically Signed   By: Keith Rake M.D.   On: 12/20/2018 02:01    EKG: Independently reviewed.  NSR with rate 94; LAFB; nonspecific ST changes with no evidence of acute ischemia; NSCSLT   Labs on Admission: I have personally reviewed the available labs and imaging studies at the time of the admission.  Pertinent labs:   Glucose 107 BUN 43/Creatinine 1.50/GFR 31; 29/1.14/43 in 03/2017 Anion gap 18 AST 69/ALT 39/Bili 1.4 CK 870 WBC 12.6 COVID negative UA: 100 glucose; large Hgb; 15 ketones; moderate LE; +nitrite; 100 protein; many bacteria; triple phosphate crystals; 6-10 WBC  Assessment/Plan Principal Problem:   Weakness Active Problems:   Chronic diarrhea   Fall at home, initial encounter   Hypertension   Pressure injury of skin   Meningioma (HCC)   AKI (acute kidney injury) ([place])   Weakness/fall -Patient who lives independently although questionable home safety presenting with fall -Patient was somnolent at the time of my evaluation, failed swallow evaluation per nursing -MRI unremarkable other than mildly enlarging meningioma -Will observe for now with PT/OT/ST evaluations -She appears likely to need placement, although it is less certain whether she would participate -UA resulted late and clearly appears to indicate dehydration (ketonuria) as well as UTI; will order urine culture and start empiric Rocephin  AKI -Patient's weakness may be associated with AKI on stage 3 CKD -Baseline  creatinine 1.1, now 1.5 -Will hydrate and follow  Meningioma -This is chronic in nature although is increased in size from prior study -There is no surrounding edema or shift noted -Patient d/w Dr. Annette Stable, but seems unlikely related to current presentation -Likely needs ongoing monitoring as an outpatient but not inpatient evaluation/treatment  Chronic diarrhea -Appears to be meal-related -Takes Lomotil with meals -NPO for now so will hold Lomotil  HTN -She does not appear to be on medications for this issue  Pressure ulcer -Wound care consultation     Note: This patient has been tested and is negative for the novel coronavirus COVID-19.  DVT prophylaxis:  Lovenox Code Status: DNR - confirmed with family Family Communication: None present; I spoke with her son by telephone  Disposition Plan: To be determined Consults called: Neurosurgery (telephone only); PT/OT/ST Admission status: It is my clinical opinion that referral for OBSERVATION is reasonable and necessary in this patient based on the above information provided. The aforementioned taken together are felt to place the patient at high risk for further clinical deterioration. However it is anticipated that the patient may be medically stable for discharge from the hospital within 24 to 48 hours.    Karmen Bongo MD [facility]   How to contact the [facility] Attending or Consulting provider [place] or covering provider during after hours [place], for this patient?  1. Check the care team in [facility] and look for a) attending/consulting [facility] provider listed and b) the [facility] team listed 2. Log into [email or web address] and use [place]'s universal password to access. If you do not have the password, please contact the hospital operator. 3. Locate the [facility] provider you are looking for under [facility] and page to a number that you can be directly reached. 4. If you still have difficulty reaching the provider, please page the [facility]  (Director on Call) for the Hospitalists listed on amion for assistance.   12/20/2018, 1:09 PM

## 2018-12-20 NOTE — ED Notes (Signed)
Pt is NSR on monitor 

## 2018-12-20 NOTE — ED Notes (Signed)
ED TO INPATIENT HANDOFF REPORT  ED Nurse Name and Phone #:   S Name/Age/Gender [name] 85 y.o. female Room/Bed: 022C/022C  Code Status   Code Status: Prior  Home/SNF/Other Home Patient oriented to: self, place, time and situation Is this baseline? Yes   Triage Complete: Triage complete  Chief Complaint Dizziness  Triage Note Pt here from home after fall. Pt reportedly left on ground for 3 days before friends came and checked on patient. EMS called initially but patient refused. An hour later patient fell again.     Allergies Allergies  Allergen Reactions  . Hydromorphone     Pt does not remember  . Lisinopril     fatigue  . Mirtazapine     Pt doesn't remember.    . Nitrofuran Derivatives Diarrhea  . Septra [Bactrim]     Pt doesn't remember.     Level of Care/Admitting Diagnosis ED Disposition    ED Disposition Condition Comment   Admit  Hospital Area: [facility] [[id number]]  Level of Care: Telemetry Medical [104]  I expect the patient will be discharged within 24 hours: No (not a candidate for 5C-Observation unit)  Covid Evaluation: Asymptomatic Screening Protocol (No Symptoms)  Diagnosis: Weakness [774128]  Admitting Physician: Rise Patience [phone]  Attending Physician: Rise Patience Lei.Right  PT Class (Do Not Modify): Observation [104]  PT Acc Code (Do Not Modify): Observation [10022]       B Medical/Surgery History Past Medical History:  Diagnosis Date  . Anemia   . Arthritis   . Cancer ([place])    melanoma  . Chronic diarrhea   . Complication of anesthesia    hallucinations in the past, last surgery was OK anesth.   Marland Kitchen COPD (chronic obstructive pulmonary disease) ([place])   . 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  Marland Kitchen GERD (gastroesophageal reflux disease)   . Goiter   . Headache(784.0)    sinus  . History of blood transfusion 2006   due to colon bleeding   . Hypertension   . Neuromuscular  disorder ([place])    peripheral neuropathy - both feet   . Osteoporosis   . Shortness of breath dyspnea   . UTI (lower urinary tract infection)    start Tx today /w probiotic- 08/18/2014    Past Surgical History:  Procedure Laterality Date  . ABDOMINAL HYSTERECTOMY    . APPENDECTOMY    . BALLOON DILATION  04/19/2011   Procedure: BALLOON DILATION;  Surgeon: Landry Dyke, MD;  Location: [facility];  Service: Endoscopy;  Laterality: N/A;  . BLADDER SURGERY    . COLON SURGERY  2006   Dr. Zella Richer- due to diverticulitis  . ESOPHAGOGASTRODUODENOSCOPY  04/19/2011   Procedure: ESOPHAGOGASTRODUODENOSCOPY (EGD);  Surgeon: Landry Dyke, MD;  Location: [facility];  Service: Endoscopy;  Laterality: N/A;  mac   . hip replacement-bilateral    . JOINT REPLACEMENT    . REVERSE SHOULDER ARTHROPLASTY Left 08/27/2014   Procedure: REVERSE LEFT SHOULDER ARTHROPLASTY;  Surgeon: Justice Britain, MD;  Location: [facility];  Service: Orthopedics;  Laterality: Left;  . TOTAL KNEE ARTHROPLASTY Bilateral      A IV Location/Drains/Wounds Patient Lines/Drains/Airways Status   Active Line/Drains/Airways    Name:   Placement date:   Placement time:   Site:   Days:   Peripheral IV 12/20/18 Left Forearm   12/20/18    0000    Forearm   less  than 1   Peripheral IV 12/20/18 Right Antecubital   12/20/18    0631    Antecubital   less than 1   External Urinary Catheter   04/19/17    0330    -   610   Incision (Closed) 04/19/17 Head Posterior;Upper   04/19/17    0300     610   Pressure Injury 12/19/18 Sacrum Stage I -  Intact skin with non-blanchable redness of a localized area usually over a bony prominence.   12/19/18    2359     1          Intake/Output Last 24 hours  Intake/Output Summary (Last 24 hours) at 12/20/2018 0842 Last data filed at 12/20/2018 0537 Gross per 24 hour  Intake 1000 ml  Output -  Net 1000 ml    Labs/Imaging Results for orders placed or performed during the hospital encounter of  12/19/18 (from the past 48 hour(s))  CBC with Differential     Status: Abnormal   Collection Time: 12/20/18 12:09 AM  Result Value Ref Range   WBC 12.6 (H) 4.0 - 10.5 K/uL   RBC 4.67 3.87 - 5.11 MIL/uL   Hemoglobin 14.1 12.0 - 15.0 g/dL   HCT 43.6 36.0 - 46.0 %   MCV 93.4 80.0 - 100.0 fL   MCH 30.2 26.0 - 34.0 pg   MCHC 32.3 30.0 - 36.0 g/dL   RDW 13.3 11.5 - 15.5 %   Platelets 268 150 - 400 K/uL    Comment: REPEATED TO VERIFY   nRBC 0.0 0.0 - 0.2 %   Neutrophils Relative % 89 %   Neutro Abs 11.3 (H) 1.7 - 7.7 K/uL   Lymphocytes Relative 3 %   Lymphs Abs 0.4 (L) 0.7 - 4.0 K/uL   Monocytes Relative 7 %   Monocytes Absolute 0.8 0.1 - 1.0 K/uL   Eosinophils Relative 0 %   Eosinophils Absolute 0.0 0.0 - 0.5 K/uL   Basophils Relative 0 %   Basophils Absolute 0.0 0.0 - 0.1 K/uL   Immature Granulocytes 1 %   Abs Immature Granulocytes 0.06 0.00 - 0.07 K/uL    Comment: Performed at [facility], [address], [place], [place] [address]  Comprehensive metabolic panel     Status: Abnormal   Collection Time: 12/20/18 12:09 AM  Result Value Ref Range   Sodium 140 135 - 145 mmol/L   Potassium 3.8 3.5 - 5.1 mmol/L   Chloride 100 98 - 111 mmol/L   CO2 22 22 - 32 mmol/L   Glucose, Bld 107 (H) 70 - 99 mg/dL   BUN 43 (H) 8 - 23 mg/dL   Creatinine, Ser 1.50 (H) 0.44 - 1.00 mg/dL   Calcium 9.9 8.9 - 10.3 mg/dL   Total Protein 7.1 6.5 - 8.1 g/dL   Albumin 3.8 3.5 - 5.0 g/dL   AST 69 (H) 15 - 41 U/L   ALT 39 0 - 44 U/L   Alkaline Phosphatase 75 38 - 126 U/L   Total Bilirubin 1.4 (H) 0.3 - 1.2 mg/dL   GFR calc non Af Amer 31 (L) >60 mL/min   GFR calc Af Amer 36 (L) >60 mL/min   Anion gap 18 (H) 5 - 15    Comment: Performed at [facility], [place] [address] [place], [place] [address]  CK     Status: Abnormal   Collection Time: 12/20/18 12:09 AM  Result Value Ref Range   Total CK  870 (H) 38 - 234 U/L    Comment: Performed at [facility], [place] [address],  [place], [place] [address]  CBG monitoring, ED     Status: Abnormal   Collection Time: 12/20/18 12:42 AM  Result Value Ref Range   Glucose-Capillary 102 (H) 70 - 99 mg/dL   Ct Head Wo Contrast  Result Date: 12/20/2018 CLINICAL DATA:  Fall at home. EXAM: CT HEAD WITHOUT CONTRAST CT CERVICAL SPINE WITHOUT CONTRAST TECHNIQUE: Multidetector CT imaging of the head and cervical spine was performed following the standard protocol without intravenous contrast. Multiplanar CT image reconstructions of the cervical spine were also generated. COMPARISON:  04/19/2017, 06/05/2016 CT FINDINGS: CT HEAD FINDINGS Brain: No intracranial hemorrhage. Extra-axial left frontal hyperdense mass measuring 2.7 x 2.1 x 2.8 cm has slightly increased in size from prior exam where it measured 2.4 x 1.9 x 2.1 cm. Similar localized mass-effect on brain parenchyma. No midline shift. Age related atrophy. Chronic small vessel ischemia stable. 3 No hydrocephalus. The basilar cisterns are patent. No evidence of territorial infarct or acute ischemia. No extra-axial or intracranial fluid collection. Vascular: Atherosclerosis of skullbase vasculature without hyperdense vessel or abnormal calcification. Skull: No fracture or focal lesion. Small frontal calcified sebaceous cyst in the midline versus small exostosis. This is unchanged. Sinuses/Orbits: Paranasal sinuses and mastoid air cells are clear. The visualized orbits are unremarkable. Other: None. CT CERVICAL SPINE FINDINGS Alignment: Normal. Skull base and vertebrae: No acute fracture. Vertebral body heights are maintained. The dens and skull base are intact. Soft tissues and spinal canal: No prevertebral fluid or swelling. No visible canal hematoma. Disc levels: Diffuse degenerative disc disease with disc space narrowing and endplate spurring. Multilevel facet hypertrophy. There is bony canal narrowing at the level of C5-C6 and to a lesser extent C6-C7. multilevel neural foraminal stenosis. Upper  chest: No acute finding. Mild biapical pleuroparenchymal scarring. Other: Both shoulders included on the coronal reformats. Reverse left shoulder arthroplasty. High-riding humeral head abutting the undersurface of the acromion with associated degenerative change. IMPRESSION: 1. No acute intracranial abnormality. No skull fracture. 2. Known left frontal extra-axial hyperdense mass is most consistent with a meningioma, however has mildly increased in size since 2018 CT currently measuring 2.7 x 2.1 x 2.8 cm. 3. Multilevel degenerative change throughout the cervical spine without acute fracture or subluxation. Electronically Signed   By: Keith Rake M.D.   On: 12/20/2018 01:16   Ct Cervical Spine Wo Contrast  Result Date: 12/20/2018 CLINICAL DATA:  Fall at home. EXAM: CT HEAD WITHOUT CONTRAST CT CERVICAL SPINE WITHOUT CONTRAST TECHNIQUE: Multidetector CT imaging of the head and cervical spine was performed following the standard protocol without intravenous contrast. Multiplanar CT image reconstructions of the cervical spine were also generated. COMPARISON:  04/19/2017, 06/05/2016 CT FINDINGS: CT HEAD FINDINGS Brain: No intracranial hemorrhage. Extra-axial left frontal hyperdense mass measuring 2.7 x 2.1 x 2.8 cm has slightly increased in size from prior exam where it measured 2.4 x 1.9 x 2.1 cm. Similar localized mass-effect on brain parenchyma. No midline shift. Age related atrophy. Chronic small vessel ischemia stable. 3 No hydrocephalus. The basilar cisterns are patent. No evidence of territorial infarct or acute ischemia. No extra-axial or intracranial fluid collection. Vascular: Atherosclerosis of skullbase vasculature without hyperdense vessel or abnormal calcification. Skull: No fracture or focal lesion. Small frontal calcified sebaceous cyst in the midline versus small exostosis. This is unchanged. Sinuses/Orbits: Paranasal sinuses and mastoid air cells are clear. The visualized orbits are  unremarkable. Other:  None. CT CERVICAL SPINE FINDINGS Alignment: Normal. Skull base and vertebrae: No acute fracture. Vertebral body heights are maintained. The dens and skull base are intact. Soft tissues and spinal canal: No prevertebral fluid or swelling. No visible canal hematoma. Disc levels: Diffuse degenerative disc disease with disc space narrowing and endplate spurring. Multilevel facet hypertrophy. There is bony canal narrowing at the level of C5-C6 and to a lesser extent C6-C7. multilevel neural foraminal stenosis. Upper chest: No acute finding. Mild biapical pleuroparenchymal scarring. Other: Both shoulders included on the coronal reformats. Reverse left shoulder arthroplasty. High-riding humeral head abutting the undersurface of the acromion with associated degenerative change. IMPRESSION: 1. No acute intracranial abnormality. No skull fracture. 2. Known left frontal extra-axial hyperdense mass is most consistent with a meningioma, however has mildly increased in size since 2018 CT currently measuring 2.7 x 2.1 x 2.8 cm. 3. Multilevel degenerative change throughout the cervical spine without acute fracture or subluxation. Electronically Signed   By: Keith Rake M.D.   On: 12/20/2018 01:16   Mr Brain Wo Contrast  Result Date: 12/20/2018 CLINICAL DATA:  Focal neuro deficit with stroke suspected EXAM: MRI HEAD WITHOUT CONTRAST TECHNIQUE: Multiplanar, multiecho pulse sequences of the brain and surrounding structures were obtained without intravenous contrast. COMPARISON:  Head CT from earlier today FINDINGS: Brain: No acute infarction, hemorrhage, hydrocephalus, extra-axial collection. Confluent FLAIR hyperintensity in the cerebral white matter attributed to chronic small vessel ischemia. Small vessel ischemic type changes are also seen in the deep gray nuclei, especially the bilateral thalamus, and to a mild extent in the pons. Remote micro hemorrhages in the deep brain and bilateral cerebellum,  a hypertensive pattern. Small remote left cerebellar infarct. Known extra-axial mass along the high and lateral left frontal lobe measuring 3.1 cm. The mass has enlarged since 2018 head CT when it measured 2 cm. No adjacent brain edema Vascular: Normal arterial flow voids. Likely incidental loss of flow void in the right sigmoid and transverse sinuses, often attributed to venous impingement in the neck. Skull and upper cervical spine: No focal marrow lesion. C2-3 and C3-4 disc and facet degeneration. Probable osteoma in the central forehead. Sinuses/Orbits: Negative IMPRESSION: 1. No emergent finding. 2. Extensive chronic small vessel ischemia with remote hypertensive pattern micro hemorrhages. 3. 3.1 cm meningioma along the high left frontal convexity which has enlarged from 2 cm in 2018. No adjacent brain edema. Electronically Signed   By: Monte Fantasia M.D.   On: 12/20/2018 08:11   Dg Chest Portable 1 View  Result Date: 12/20/2018 CLINICAL DATA:  Fall. EXAM: PORTABLE CHEST 1 VIEW COMPARISON:  04/18/2017 FINDINGS: Chronic hyperinflation. Biapical pleural parenchymal scarring. Unchanged heart size and mediastinal contours with aortic atherosclerosis. No acute airspace disease. Multiple skin folds project over the both hemi thoraces, no pneumothorax. No large pleural effusion. No pulmonary edema. Left shoulder arthroplasty. Chronic right rotator cuff arthropathy. No acute osseous abnormalities are seen. IMPRESSION: 1. No acute abnormality or change from prior exam. 2. Stable chronic hyperinflation and biapical pleural parenchymal scarring. 3.  Aortic Atherosclerosis (ICD10-I70.0). Electronically Signed   By: Keith Rake M.D.   On: 12/20/2018 02:01    Pending Labs Unresulted Labs (From admission, onward)    Start     Ordered   12/20/18 0524  SARS CORONAVIRUS 2 (TAT 6-24 HRS) Nasopharyngeal Nasopharyngeal Swab  (Asymptomatic/Tier 2 Patients Labs)  Once,   STAT    Question Answer Comment  Is this  test for diagnosis or screening Screening   Symptomatic for  COVID-19 as defined by CDC No   Hospitalized for COVID-19 No   Admitted to ICU for COVID-19 No   Previously tested for COVID-19 No   Resident in a congregate (group) care setting No   Employed in healthcare setting No   Pregnant No      12/20/18 0523   12/20/18 0010  Urinalysis, Routine w reflex microscopic  ONCE - STAT,   STAT     12/20/18 0010          Vitals/Pain Today's Vitals   12/20/18 0600 12/20/18 0615 12/20/18 0630 12/20/18 0800  BP: (!) 149/92 (!) 157/84  (!) 148/79  Pulse: 80 93 90 80  Resp: 16 18 (!) 23 16  Temp:      TempSrc:      SpO2: 96% 97% 97% 98%    Isolation Precautions No active isolations  Medications Medications  0.9 %  sodium chloride infusion ( Intravenous New Bag/Given 12/20/18 0550)  sodium chloride 0.9 % bolus 500 mL (0 mLs Intravenous Stopped 12/20/18 0537)  sodium chloride 0.9 % bolus 500 mL (0 mLs Intravenous Stopped 12/20/18 0536)    Mobility walks with device Moderate fall risk   Focused Assessments Cardiac Assessment Handoff:  Cardiac Rhythm: Normal sinus rhythm Lab Results  Component Value Date   JLLVDIX 185 (H) 12/20/2018   TROPONINI 0.83 (HH) 04/20/2017   No results found for: DDIMER Does the Patient currently have chest pain? No     R Recommendations: See Admitting Provider Note  Report given to:   Additional Notes:

## 2018-12-20 NOTE — ED Notes (Signed)
Pt had large watery BM. I cleaned pt and applied clean brief.

## 2018-12-20 NOTE — ED Notes (Signed)
This NT and RN attempted an in and out cath. Pt could not produce any urine. This NT and RN attempted to stand pt up. Pt kept leaning over to side. Pt very lethargic. When trying to stand her pt very unsteady and started to have BM on the bed. Pt attempted to take one step but was shaky and her foot didn't move. Pt states "I wish I was at [facility] they wouldn't make me leave they let me stay." Patient placed back in bed.

## 2018-12-20 NOTE — ED Notes (Signed)
Lunch Tray Ordered @ 1143. 

## 2018-12-20 NOTE — ED Notes (Signed)
Peck pts daughter-in-law is with son, wants a pt update

## 2018-12-20 NOTE — ED Notes (Signed)
Patient cleaned up and placed on purewick. Linen changed.

## 2018-12-20 NOTE — ED Provider Notes (Signed)
TIME SEEN: 12:06 AM  CHIEF COMPLAINT: fall  HPI: Patient is an 85 year old female with history of hypertension, COPD not on home oxygen who presents to the emergency department EMS after a fall.  States that she fell to days ago.  She is not sure why she fell.  States she was on the ground for 2 full days before a neighbor came over to check on her and found her on the ground.  EMS was called but patient refused to come to the emergency department.  EMS left and after they left patient fell again.  She states she normally ambulates with a walker but states that her house is very small and there is lots of furniture and she is unable to use the walker in the house.  She is not sure if she hit her head.  She does not think there was loss of consciousness.  She is not on blood thinners.  Denies fevers, cough, vomiting or diarrhea.  No chest pain or shortness of breath.  No numbness or weakness.  States that her second fall was due to feeling lightheaded after standing.  [place] daughter in law  ROS: See HPI Constitutional: no fever  Eyes: no drainage  ENT: no runny nose   Cardiovascular:  no chest pain  Resp: no SOB  GI: no vomiting GU: no dysuria Integumentary: no rash  Allergy: no hives  Musculoskeletal: no leg swelling  Neurological: no slurred speech ROS otherwise negative  PAST MEDICAL HISTORY/PAST SURGICAL HISTORY:  Past Medical History:  Diagnosis Date  . Anemia   . Arthritis   . Cancer ([place])    melanoma  . Chronic diarrhea   . Complication of anesthesia    hallucinations in the past, last surgery was OK anesth.   Marland Kitchen COPD (chronic obstructive pulmonary disease) ([place])   . 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  Marland Kitchen GERD (gastroesophageal reflux disease)   . Goiter   . Headache(784.0)    sinus  . History of blood transfusion 2006   due to colon bleeding   . Hypertension   . Neuromuscular disorder ([place])    peripheral neuropathy - both  feet   . Neuropathy   . Osteoporosis   . Shortness of breath dyspnea   . UTI (lower urinary tract infection)    start Tx today /w probiotic- 08/18/2014     MEDICATIONS:  Prior to Admission medications   Medication Sig Start Date End Date Taking? Authorizing Provider  diphenoxylate-atropine (LOMOTIL) 2.5-0.025 MG tablet Take 2 tablets by mouth 3 (three) times daily before meals. 04/20/17   Aline August, MD  glycopyrrolate (ROBINUL) 1 MG tablet Take 1 mg by mouth 3 (three) times daily before meals.     [provider]  naproxen sodium (ALEVE) 220 MG tablet Take 220 mg by mouth 2 (two) times daily as needed (pain).    [provider]  oxyCODONE-acetaminophen (PERCOCET) 5-325 MG tablet Take 1 tablet by mouth every 6 (six) hours as needed for severe pain. 04/20/17   Aline August, MD  paregoric 2 MG/5ML solution Take 5 mLs by mouth 3 (three) times daily before meals.    [provider]    ALLERGIES:  Allergies  Allergen Reactions  . Hydromorphone     Pt does not remember  . Lisinopril     fatigue  . Mirtazapine     Pt doesn't remember.    . Nitrofuran Derivatives Diarrhea  .  Septra [Bactrim]     Pt doesn't remember.     SOCIAL HISTORY:  Social History   Tobacco Use  . Smoking status: Former Smoker    Quit date: 02/28/1968    Years since quitting: 50.8  Substance Use Topics  . Alcohol use: Yes    Comment: rarely    FAMILY HISTORY: Family History  Problem Relation Age of Onset  . Heart attack Mother   . Stroke Father     EXAM: BP (!) 178/96   Pulse 95   Temp (!) 97.5 F (36.4 C) (Oral)   Resp (!) 35   SpO2 96%  CONSTITUTIONAL: Alert and oriented x 4 and responds appropriately to questions.  Thin, elderly HEAD: Normocephalic; atraumatic EYES: Conjunctivae clear, PERRL, EOMI ENT: normal nose; no rhinorrhea; moist mucous membranes; pharynx without lesions noted; no dental injury; no septal hematoma NECK: Supple, no meningismus, no LAD; no  midline spinal tenderness, step-off or deformity; trachea midline, cervical collar in place CARD: RRR; S1 and S2 appreciated; no murmurs, no clicks, no rubs, no gallops RESP: Normal chest excursion without splinting or tachypnea; breath sounds clear and equal bilaterally; no wheezes, no rhonchi, no rales; no hypoxia or respiratory distress CHEST:  chest wall stable, no crepitus or ecchymosis or deformity, nontender to palpation; no flail chest ABD/GI: Normal bowel sounds; non-distended; soft, non-tender, no rebound, no guarding; no ecchymosis or other lesions noted PELVIS:  stable, nontender to palpation BACK:  The back appears normal and is non-tender to palpation, there is no CVA tenderness; no midline spinal tenderness, step-off or deformity EXT: Normal ROM in all joints; non-tender to palpation; no edema; normal capillary refill; no cyanosis, no bony tenderness or bony deformity of patient's extremities, no joint effusion, compartments are soft, extremities are warm and well-perfused, old bruising to bilateral feet but no tenderness, 2+ radial and DP pulses bilaterally, muscle atrophy noted diffusely SKIN: Normal color for age and race; warm NEURO: Moves all extremities equally, reports normal sensation diffusely, cranial nerves II through XII intact, no drift, normal speech PSYCH: The patient's mood and manner are appropriate. Grooming and personal hygiene are appropriate.  MEDICAL DECISION MAKING: Patient here with fall and being on the ground for 2 days.  Hypertensive with EMS and here in the emergency department.  Patient denies having any pain.  Will obtain CT imaging of the head and cervical spine.  Will obtain labs, urine, CK level.  Will hydrate patient.  Will monitor her blood pressure closely.  Patient would like to be discharged home if possible.  ED PROGRESS: Patient's labs unremarkable other than some mild acute on chronic renal failure.  Patient has received a liter of IV fluids.  CT  scan showed no acute abnormality and cervical collar has been removed.  Patient initially refusing to ambulate and refusing to allow [place] to obtain a urinalysis.  Now agrees to I&O catheterization and will ambulate with walker.  Unable to obtain catheterized urine specimen as nursing staff unable to find urethra at this time.  We will continue IV hydration and place pure wick.  Nursing staff reports that she has been choking intermittently on fluids and applesauce.  We will keep her n.p.o. at this time.  She was unable to ambulate and normally ambulates unassisted in her house.  Staff report that she was too weak to even stand.  Will admit for continued IV hydration, speech therapy evaluation, PT/OT.  5:43 AM Discussed patient's case with hospitalist, Dr. Hal Hope.  I have recommended  admission and patient (and family if present) agree with this plan. Admitting physician will place admission orders.  After discussion, will order MRI brain to rule out stroke given new weakness and difficulty swallowing.  I reviewed all nursing notes, vitals, pertinent previous records, EKGs, lab and urine results, imaging (as available).      EKG Interpretation  Date/Time:  Friday December 20 2018 02:01:11 EDT Ventricular Rate:  94 PR Interval:    QRS Duration: 93 QT Interval:  365 QTC Calculation: 457 R Axis:   -64 Text Interpretation:  Sinus rhythm Probable left atrial enlargement Left anterior fascicular block Abnormal R-wave progression, early transition Probable anteroseptal infarct, old No significant change since last tracing Confirmed by , Cyril Mourning [phone]) on 12/20/2018 2:13:52 AM       [name] was evaluated in Emergency Department on 12/20/2018 for the symptoms described in the history of present illness. She was evaluated in the context of the global COVID-19 pandemic, which necessitated consideration that the patient might be at risk for infection with the SARS-CoV-2 virus that causes  COVID-19. Institutional protocols and algorithms that pertain to the evaluation of patients at risk for COVID-19 are in a state of rapid change based on information released by regulatory bodies including the CDC and federal and state organizations. These policies and algorithms were followed during the patient's care in the ED.     , Delice Bison, DO 12/20/18 [phone]

## 2018-12-20 NOTE — Evaluation (Signed)
Physical Therapy Evaluation Patient Details Name: [name] MRN: [id number] DOB: March 25, 1930 Today's Date: 12/20/2018   History of Present Illness  Pt is an 85 y/o female admitted after being found down for 2 days following fall. Pt found to be dehydrated. MRI negative for acute abnormality. CT of head and C spine negative. PMH includes HTN, COPD, arthritis, bilateral TKA and bilateral THA.   Clinical Impression  Pt admitted secondary to problem above with deficits below. Pt with urinary and bowel incontinence, so session focused on clean up. Pt rolled side to side multiple times throughout and required mod to max A. Pt currently lives alone and feel she is at increased risk for falls. Feel she would benefit from SNF level therapy at d/c. Will continue to follow acutely to maximize functional mobility independence and safety.     Follow Up Recommendations SNF;Supervision/Assistance - 24 hour    Equipment Recommendations  Other (comment)(TBD)    Recommendations for Other Services       Precautions / Restrictions Precautions Precautions: Fall Precaution Comments: Pt with fall per chart. Noted bruising on arms as well.  Restrictions Weight Bearing Restrictions: No      Mobility  Bed Mobility Overal bed mobility: Needs Assistance Bed Mobility: Rolling Rolling: Mod assist;Max assist         General bed mobility comments: Mod to max A to roll multiple times for clean up following BM and urinary incontinence. Further mobility deferred secondary to incontinence. RN in room to assist with bed mobility.   Transfers                    Ambulation/Gait                Stairs            Wheelchair Mobility    Modified Rankin (Stroke Patients Only)       Balance                                             Pertinent Vitals/Pain Pain Assessment: Faces Faces Pain Scale: Hurts little more Pain Location: generalized Pain Descriptors /  Indicators: Guarding;Grimacing Pain Intervention(s): Limited activity within patient's tolerance;Monitored during session;Repositioned    Home Living Family/patient expects to be discharged to:: Private residence Living Arrangements: Alone Available Help at Discharge: Family Type of Home: House       Home Layout: One level Home Equipment: None      Prior Function Level of Independence: Independent         Comments: Reports she still drives and at home uses furniture for balance.      Hand Dominance        Extremity/Trunk Assessment   Upper Extremity Assessment Upper Extremity Assessment: Defer to OT evaluation    Lower Extremity Assessment Lower Extremity Assessment: Generalized weakness       Communication   Communication: No difficulties  Cognition Arousal/Alertness: Awake/alert Behavior During Therapy: WFL for tasks assessed/performed Overall Cognitive Status: No family/caregiver present to determine baseline cognitive functioning                                 General Comments: Pt tended to repeat same stories throughout session. Slowed processing and decreased safety awareness noted.       General Comments  Exercises     Assessment/Plan    PT Assessment Patient needs continued PT services  PT Problem List Decreased strength;Decreased activity tolerance;Decreased balance;Decreased mobility;Decreased knowledge of use of DME;Decreased knowledge of precautions;Decreased safety awareness;Decreased cognition       PT Treatment Interventions Gait training;Functional mobility training;Therapeutic activities;Therapeutic exercise;Balance training;Stair training;Patient/family education;Cognitive remediation    PT Goals (Current goals can be found in the Care Plan section)  Acute Rehab PT Goals PT Goal Formulation: Patient unable to participate in goal setting Potential to Achieve Goals: Fair    Frequency Min 2X/week   Barriers to  discharge Decreased caregiver support      Co-evaluation               AM-PAC PT "6 Clicks" Mobility  Outcome Measure Help needed turning from your back to your side while in a flat bed without using bedrails?: A Lot Help needed moving from lying on your back to sitting on the side of a flat bed without using bedrails?: A Lot Help needed moving to and from a bed to a chair (including a wheelchair)?: A Lot Help needed standing up from a chair using your arms (e.g., wheelchair or bedside chair)?: A Lot Help needed to walk in hospital room?: A Lot Help needed climbing 3-5 steps with a railing? : Total 6 Click Score: 11    End of Session   Activity Tolerance: Patient tolerated treatment well Patient left: in bed;with call bell/phone within reach;with bed alarm set Nurse Communication: Mobility status PT Visit Diagnosis: Muscle weakness (generalized) (M62.81);History of falling (Z91.81);Unsteadiness on feet (R26.81)    Time: [id number] PT Time Calculation (min) (ACUTE ONLY): 23 min   Charges:   PT Evaluation $PT Eval Moderate Complexity: 1 Mod PT Treatments $Therapeutic Activity: 8-22 mins        [name], PT, DPT  Acute Rehabilitation Services  Pager: [phone] Office: [phone]   [name] 12/20/2018, 6:27 PM

## 2018-12-21 DIAGNOSIS — M1990 Unspecified osteoarthritis, unspecified site: Secondary | ICD-10-CM | POA: Diagnosis present

## 2018-12-21 DIAGNOSIS — N390 Urinary tract infection, site not specified: Secondary | ICD-10-CM | POA: Diagnosis present

## 2018-12-21 DIAGNOSIS — J449 Chronic obstructive pulmonary disease, unspecified: Secondary | ICD-10-CM | POA: Diagnosis present

## 2018-12-21 DIAGNOSIS — F329 Major depressive disorder, single episode, unspecified: Secondary | ICD-10-CM | POA: Diagnosis present

## 2018-12-21 DIAGNOSIS — R262 Difficulty in walking, not elsewhere classified: Secondary | ICD-10-CM | POA: Diagnosis present

## 2018-12-21 DIAGNOSIS — D329 Benign neoplasm of meninges, unspecified: Secondary | ICD-10-CM | POA: Diagnosis present

## 2018-12-21 DIAGNOSIS — R824 Acetonuria: Secondary | ICD-10-CM | POA: Diagnosis present

## 2018-12-21 DIAGNOSIS — K219 Gastro-esophageal reflux disease without esophagitis: Secondary | ICD-10-CM | POA: Diagnosis present

## 2018-12-21 DIAGNOSIS — E860 Dehydration: Secondary | ICD-10-CM | POA: Diagnosis present

## 2018-12-21 DIAGNOSIS — R64 Cachexia: Secondary | ICD-10-CM | POA: Diagnosis present

## 2018-12-21 DIAGNOSIS — G629 Polyneuropathy, unspecified: Secondary | ICD-10-CM | POA: Diagnosis present

## 2018-12-21 DIAGNOSIS — D649 Anemia, unspecified: Secondary | ICD-10-CM | POA: Diagnosis present

## 2018-12-21 DIAGNOSIS — M810 Age-related osteoporosis without current pathological fracture: Secondary | ICD-10-CM | POA: Diagnosis present

## 2018-12-21 DIAGNOSIS — Z96643 Presence of artificial hip joint, bilateral: Secondary | ICD-10-CM | POA: Diagnosis present

## 2018-12-21 DIAGNOSIS — Z96653 Presence of artificial knee joint, bilateral: Secondary | ICD-10-CM | POA: Diagnosis present

## 2018-12-21 DIAGNOSIS — N179 Acute kidney failure, unspecified: Secondary | ICD-10-CM | POA: Diagnosis present

## 2018-12-21 DIAGNOSIS — Z85820 Personal history of malignant melanoma of skin: Secondary | ICD-10-CM | POA: Diagnosis not present

## 2018-12-21 DIAGNOSIS — K529 Noninfective gastroenteritis and colitis, unspecified: Secondary | ICD-10-CM | POA: Diagnosis present

## 2018-12-21 DIAGNOSIS — E876 Hypokalemia: Secondary | ICD-10-CM | POA: Diagnosis not present

## 2018-12-21 DIAGNOSIS — Z96612 Presence of left artificial shoulder joint: Secondary | ICD-10-CM | POA: Diagnosis present

## 2018-12-21 DIAGNOSIS — W19XXXA Unspecified fall, initial encounter: Secondary | ICD-10-CM | POA: Diagnosis present

## 2018-12-21 DIAGNOSIS — Y92009 Unspecified place in unspecified non-institutional (private) residence as the place of occurrence of the external cause: Secondary | ICD-10-CM | POA: Diagnosis not present

## 2018-12-21 DIAGNOSIS — Z20828 Contact with and (suspected) exposure to other viral communicable diseases: Secondary | ICD-10-CM | POA: Diagnosis present

## 2018-12-21 DIAGNOSIS — Z66 Do not resuscitate: Secondary | ICD-10-CM | POA: Diagnosis present

## 2018-12-21 DIAGNOSIS — I10 Essential (primary) hypertension: Secondary | ICD-10-CM | POA: Diagnosis present

## 2018-12-21 DIAGNOSIS — L89152 Pressure ulcer of sacral region, stage 2: Secondary | ICD-10-CM | POA: Diagnosis present

## 2018-12-21 DIAGNOSIS — R531 Weakness: Secondary | ICD-10-CM | POA: Diagnosis present

## 2018-12-21 LAB — CBC
HCT: 33.4 % — ABNORMAL LOW (ref 36.0–46.0)
Hemoglobin: 11.9 g/dL — ABNORMAL LOW (ref 12.0–15.0)
MCH: 33.7 pg (ref 26.0–34.0)
MCHC: 35.6 g/dL (ref 30.0–36.0)
MCV: 94.6 fL (ref 80.0–100.0)
Platelets: 223 10*3/uL (ref 150–400)
RBC: 3.53 MIL/uL — ABNORMAL LOW (ref 3.87–5.11)
RDW: 13.8 % (ref 11.5–15.5)
WBC: 9.2 10*3/uL (ref 4.0–10.5)
nRBC: 0 % (ref 0.0–0.2)

## 2018-12-21 LAB — URINE CULTURE

## 2018-12-21 LAB — BASIC METABOLIC PANEL
Anion gap: 13 (ref 5–15)
BUN: 27 mg/dL — ABNORMAL HIGH (ref 8–23)
CO2: 20 mmol/L — ABNORMAL LOW (ref 22–32)
Calcium: 8.9 mg/dL (ref 8.9–10.3)
Chloride: 112 mmol/L — ABNORMAL HIGH (ref 98–111)
Creatinine, Ser: 1.08 mg/dL — ABNORMAL HIGH (ref 0.44–1.00)
GFR calc Af Amer: 54 mL/min — ABNORMAL LOW (ref >60)
GFR calc non Af Amer: 46 mL/min — ABNORMAL LOW (ref >60)
Glucose, Bld: 74 mg/dL (ref 70–99)
Potassium: 3.1 mmol/L — ABNORMAL LOW (ref 3.5–5.1)
Sodium: 145 mmol/L (ref 135–145)

## 2018-12-21 LAB — TSH: TSH: 0.908 u[IU]/mL (ref 0.350–4.500)

## 2018-12-21 LAB — CK: Total CK: 257 U/L — ABNORMAL HIGH (ref 38–234)

## 2018-12-21 MED ORDER — JUVEN PO PACK
1.0000 | PACK | Freq: Two times a day (BID) | ORAL | Status: DC
  Administered 2018-12-21 – 2018-12-24 (×7): 1 via ORAL
  Filled 2018-12-21 (×7): qty 1

## 2018-12-21 MED ORDER — DIPHENOXYLATE-ATROPINE 2.5-0.025 MG PO TABS: 1.0000 | ORAL_TABLET | Freq: Four times a day (QID) | ORAL | Status: DC | PRN

## 2018-12-21 MED ORDER — DIPHENOXYLATE-ATROPINE 2.5-0.025 MG PO TABS
1.0000 | ORAL_TABLET | Freq: Three times a day (TID) | ORAL | Status: DC
  Administered 2018-12-21 – 2018-12-24 (×9): 1 via ORAL
  Filled 2018-12-21 (×11): qty 1

## 2018-12-21 MED ORDER — GLYCOPYRROLATE 1 MG PO TABS
1.0000 mg | ORAL_TABLET | Freq: Three times a day (TID) | ORAL | Status: DC
  Administered 2018-12-21 – 2018-12-24 (×8): 1 mg via ORAL
  Filled 2018-12-21 (×11): qty 1

## 2018-12-21 MED ORDER — POTASSIUM CHLORIDE CRYS ER 20 MEQ PO TBCR
40.0000 meq | EXTENDED_RELEASE_TABLET | Freq: Once | ORAL | Status: AC
  Administered 2018-12-21: 40 meq via ORAL
  Filled 2018-12-21: qty 2

## 2018-12-21 MED ORDER — HYDRALAZINE HCL 20 MG/ML IJ SOLN
10.0000 mg | INTRAMUSCULAR | Status: DC | PRN
  Administered 2018-12-21 – 2018-12-22 (×3): 10 mg via INTRAVENOUS
  Filled 2018-12-21 (×3): qty 1

## 2018-12-21 MED ORDER — ADULT MULTIVITAMIN W/MINERALS CH
1.0000 | ORAL_TABLET | Freq: Every day | ORAL | Status: DC
  Administered 2018-12-21 – 2018-12-24 (×4): 1 via ORAL
  Filled 2018-12-21 (×4): qty 1

## 2018-12-21 MED ORDER — ENSURE ENLIVE PO LIQD
237.0000 mL | Freq: Two times a day (BID) | ORAL | Status: DC
  Administered 2018-12-21 – 2018-12-24 (×7): 237 mL via ORAL

## 2018-12-21 MED ORDER — DIPHENOXYLATE-ATROPINE 2.5-0.025 MG PO TABS
1.0000 | ORAL_TABLET | Freq: Three times a day (TID) | ORAL | Status: DC
  Filled 2018-12-21: qty 1

## 2018-12-21 NOTE — Evaluation (Signed)
Occupational Therapy Evaluation Patient Details Name: [name] MRN: [id number] DOB: Aug 05, 1930 Today's Date: 12/21/2018    History of Present Illness Pt is an 85 y/o female admitted after being found down for 2 days following fall. Pt found to be dehydrated. MRI negative for acute abnormality. CT of head and C spine negative. PMH includes HTN, COPD, arthritis, bilateral TKA and bilateral THA.    Clinical Impression   Pt admitted with the above diagnoses and presents with below problem list. Pt will benefit from continued acute OT to address the below listed deficits and maximize independence with basic ADLs prior to d/c to venue below. PTA pt was living alone with very limited assist/supervision available. Pt endorses using furniture to provide stability while walking at home, drives, sponge baths (does not do shower transfers). Pt is currently +2 assist for bed mobility, mod A with UB ADLs. Pt sat EOB about 7 minutes at close min guard level. Declined standing. Of note, HR 117-135 while sitting EOB, pt appeared asymptomatic. Pt becoming visibly fatigued sitting EOB but ultimately needed cueing/encouragement to initiate lying back down.     Follow Up Recommendations  SNF    Equipment Recommendations  Other (comment)(defer to next venue)    Recommendations for Other Services       Precautions / Restrictions Precautions Precautions: Fall Precaution Comments: Pt with fall per chart. Noted bruising on arms as well.  Restrictions Weight Bearing Restrictions: No Other Position/Activity Restrictions: rectal pouch      Mobility Bed Mobility Overal bed mobility: Needs Assistance Bed Mobility: Rolling;Sit to Supine;Sidelying to Sit Rolling: Mod assist;Max assist Sidelying to sit: Max assist;Mod assist;HOB elevated   Sit to supine: Mod assist   General bed mobility comments: Mod-max for rolling to each side. Pt able to advance BLE off EOB with min A. Mod-max A needed to powerup trunk  from sidelying to EOB. Mod A to advance feet up onto bed to return to supine. +2 total to scoot pt up in bed in supine  Transfers                 General transfer comment: pt declined standing this session.    Balance Overall balance assessment: History of Falls;Needs assistance Sitting-balance support: Bilateral upper extremity supported;Feet supported Sitting balance-Leahy Scale: Fair Sitting balance - Comments: sat EOB about 7 minutes at close min guard level. Cueing needed to initiate lying back down when pt became visibly fatigued.                                    ADL either performed or assessed with clinical judgement   ADL Overall ADL's : Needs assistance/impaired Eating/Feeding: Set up;Sitting   Grooming: Moderate assistance;Sitting   Upper Body Bathing: Moderate assistance;Sitting   Lower Body Bathing: Moderate assistance;Sitting/lateral leans   Upper Body Dressing : Moderate assistance;Sitting   Lower Body Dressing: Moderate assistance;Sitting/lateral leans                 General ADL Comments: Pt completed bed mobility then sat EOB at close min guard level for about 7 minutes. Pt visibly fatgued (increased trunk flexion and resting head in hands) but needing cueing to initiate laying back down.      Vision Baseline Vision/History: Wears glasses       Perception     Praxis      Pertinent Vitals/Pain Pain Assessment: Faces Faces Pain Scale: Hurts  a little bit Pain Location: rectum Pain Descriptors / Indicators: Burning Pain Intervention(s): Monitored during session;Repositioned;Other (comment)(applied barrier cream)     Hand Dominance     Extremity/Trunk Assessment Upper Extremity Assessment Upper Extremity Assessment: Generalized weakness   Lower Extremity Assessment Lower Extremity Assessment: Defer to PT evaluation   Cervical / Trunk Assessment Cervical / Trunk Assessment: Kyphotic   Communication  Communication Communication: No difficulties   Cognition Arousal/Alertness: Awake/alert Behavior During Therapy: Flat affect Overall Cognitive Status: No family/caregiver present to determine baseline cognitive functioning                                 General Comments: Pt tended to repeat same stories throughout session. Slowed processing and decreased safety awareness noted. Tangential responses.    General Comments  HR 117-135 while sitting EOB, pt appeared asymptomatic    Exercises     Shoulder Instructions      Home Living Family/patient expects to be discharged to:: Private residence Living Arrangements: Alone Available Help at Discharge: Friend(s);Available PRN/intermittently;Family Type of Home: House       Home Layout: One level     Bathroom Shower/Tub: (sponge baths)         Home Equipment: Cane - single point          Prior Functioning/Environment Level of Independence: Independent        Comments: Reports she still drives and at home uses furniture for balance.         OT Problem List: Decreased strength;Decreased activity tolerance;Impaired balance (sitting and/or standing);Decreased safety awareness;Decreased knowledge of use of DME or AE;Decreased knowledge of precautions;Cardiopulmonary status limiting activity;Pain      OT Treatment/Interventions: Self-care/ADL training;Therapeutic exercise;Energy conservation;DME and/or AE instruction;Therapeutic activities;Patient/family education;Balance training    OT Goals(Current goals can be found in the care plan section) Acute Rehab OT Goals Patient Stated Goal: get stronger. ok with going to SNF for rehab. OT Goal Formulation: With patient Time For Goal Achievement: 01/04/19 Potential to Achieve Goals: Good ADL Goals Pt Will Perform Upper Body Bathing: with min guard assist;sitting Pt Will Perform Lower Body Bathing: with min guard assist;sit to/from stand Pt Will Transfer to  Toilet: with min guard assist;ambulating Pt Will Perform Toileting - Clothing Manipulation and hygiene: with min guard assist;sit to/from stand Additional ADL Goal #1: Pt will complete bed mobility at supervision level to prepare for OOB/EOB ADLs.  OT Frequency: Min 2X/week   Barriers to D/C:            Co-evaluation              AM-PAC OT "6 Clicks" Daily Activity     Outcome Measure Help from another person eating meals?: A Little Help from another person taking care of personal grooming?: A Lot Help from another person toileting, which includes using toliet, bedpan, or urinal?: A Lot Help from another person bathing (including washing, rinsing, drying)?: A Lot Help from another person to put on and taking off regular upper body clothing?: A Lot Help from another person to put on and taking off regular lower body clothing?: A Lot 6 Click Score: 13   End of Session Nurse Communication: Mobility status;Other (comment)(Pt needs adaptive call button 2/2 arthritic hands.)  Activity Tolerance: Patient limited by fatigue;Other (comment);Patient tolerated treatment well(HR 117-135 while sitting EOB, pt appeared asymptomatic ) Patient left: in bed;with call bell/phone within reach;with bed alarm set  OT Visit Diagnosis:  Muscle weakness (generalized) (M62.81);History of falling (Z91.81);Unsteadiness on feet (R26.81);Pain                Time: 1125-1208 OT Time Calculation (min): 43 min Charges:  OT General Charges $OT Visit: 1 Visit OT Evaluation $OT Eval Moderate Complexity: 1 Mod OT Treatments $Self Care/Home Management : 23-37 mins  Tyrone Schimke, OT Acute Rehabilitation Services Pager: [phone] Office: [phone]   Hortencia Pilar 12/21/2018, 12:36 PM

## 2018-12-21 NOTE — Evaluation (Signed)
Clinical/Bedside Swallow Evaluation Patient Details  Name: [name] MRN: [id number] Date of Birth: Jan 02, 1931  Today's Date: 12/21/2018 Time: SLP Start Time (ACUTE ONLY): 0740 SLP Stop Time (ACUTE ONLY): 0755 SLP Time Calculation (min) (ACUTE ONLY): 15 min  Past Medical History:  Past Medical History:  Diagnosis Date  . Anemia   . Arthritis   . Cancer ([name])    melanoma  . Chronic diarrhea   . Complication of anesthesia    hallucinations in the past, last surgery was OK anesth.   Marland Kitchen COPD (chronic obstructive pulmonary disease) ([place])   . 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  Marland Kitchen GERD (gastroesophageal reflux disease)   . Goiter   . Headache(784.0)    sinus  . History of blood transfusion 2006   due to colon bleeding   . Hypertension   . Neuromuscular disorder ([place])    peripheral neuropathy - both feet   . Osteoporosis   . Shortness of breath dyspnea   . UTI (lower urinary tract infection)    start Tx today /w probiotic- 08/18/2014    Past Surgical History:  Past Surgical History:  Procedure Laterality Date  . ABDOMINAL HYSTERECTOMY    . APPENDECTOMY    . BALLOON DILATION  04/19/2011   Procedure: BALLOON DILATION;  Surgeon: Landry Dyke, MD;  Location: [facility];  Service: Endoscopy;  Laterality: N/A;  . BLADDER SURGERY    . COLON SURGERY  2006   Dr. Zella Richer- due to diverticulitis  . ESOPHAGOGASTRODUODENOSCOPY  04/19/2011   Procedure: ESOPHAGOGASTRODUODENOSCOPY (EGD);  Surgeon: Landry Dyke, MD;  Location: [facility];  Service: Endoscopy;  Laterality: N/A;  mac   . hip replacement-bilateral    . JOINT REPLACEMENT    . REVERSE SHOULDER ARTHROPLASTY Left 08/27/2014   Procedure: REVERSE LEFT SHOULDER ARTHROPLASTY;  Surgeon: Justice Britain, MD;  Location: [facility];  Service: Orthopedics;  Laterality: Left;  . TOTAL KNEE ARTHROPLASTY Bilateral    HPI:  [name] is a 85 y.o. female with medical history significant of HTN; depression; and  COPD presenting after a fall.  Pt was on the ground for 2 days.  She denied previous dysphagia hx. Brain MRI on 12/21/18 was negative for acute infarct.   Assessment / Plan / Recommendation Clinical Impression  Pt presents with suspected functional oropharyngeal swallowing abilities.  Pt denied hx of dysphagia, GERD, or PNA.  She was observed with trials of ice chips, thin liquid, puree, and regular solids.   Pt exhibited mildly prolonged, but effective mastication of regular solids and no clinical s/sx of aspiration were observed with any trials.  Pt was additionally challenged with the Sharman Cheek water protocol and she did not exhibit overt s/sx of aspiration or difficulty.  Recommend initiation of regular solids and thin liquid with the following precautions: 1) Small bites/sips 2) Sit upright 90 degrees.  No further skilled ST is warranted at this time.  Please re-consult if additional needs arise.   SLP Visit Diagnosis: Dysphagia, unspecified (R13.10)    Aspiration Risk  No limitations    Diet Recommendation Regular;Thin liquid   Liquid Administration via: Cup;Straw Medication Administration: Whole meds with liquid Supervision: Patient able to self feed;Intermittent supervision to cue for compensatory strategies Compensations: Small sips/bites;Slow rate Postural Changes: Seated upright at 90 degrees    Other  Recommendations Oral Care Recommendations: Oral care BID   Follow up Recommendations None  Prognosis Barriers/Prognosis Comment: Good       Swallow Study   General HPI: [name] is a 85 y.o. female with medical history significant of HTN; depression; and COPD presenting after a fall.  Pt was on the ground for 2 days.  She denied previous dysphagia hx. Brain MRI on 12/21/18 was negative for acute infarct. Type of Study: Bedside Swallow Evaluation Previous Swallow Assessment: None documented  Diet Prior to this Study: NPO Temperature Spikes Noted:  No Respiratory Status: Room air History of Recent Intubation: No Behavior/Cognition: Alert;Cooperative;Pleasant mood Oral Cavity Assessment: Dry Oral Care Completed by SLP: No Oral Cavity - Dentition: (Partial dentures ) Vision: Functional for self-feeding Self-Feeding Abilities: Able to feed self;Needs set up Patient Positioning: Upright in bed Baseline Vocal Quality: Normal Volitional Swallow: Able to elicit    Oral/Motor/Sensory Function Overall Oral Motor/Sensory Function: Within functional limits   Ice Chips Ice chips: Within functional limits Presentation: Spoon   Thin Liquid Thin Liquid: Within functional limits Presentation: Cup;Straw;Spoon    Nectar Thick Nectar Thick Liquid: Not tested   Honey Thick Honey Thick Liquid: Not tested   Puree Puree: Within functional limits   Solid     Solid: Impaired Presentation: Spoon Oral Phase Impairments: Impaired mastication Oral Phase Functional Implications: Impaired mastication;Prolonged oral transit     [name], M.S., [place] Office: [phone]  [name] 12/21/2018,8:19 AM

## 2018-12-21 NOTE — TOC Initial Note (Signed)
Transition of Care [facility]) - Initial/Assessment Note    Patient Details  Name: [name] MRN: [id number] Date of Birth: Aug 30, 1930  Transition of Care [facility]) CM/SW Contact:    Kirstie Peri, [place] Work Phone Number: 12/21/2018, 11:17 AM  Clinical Narrative:                 MSW intern and MSW supervisor visited pt to discuss next steps and to assess. Client was sitting up and eating her breakfast. She stated that she had fallen and had been on the floor for three days before crawling to get her cordless phone. Patient notes that she has a friend who periodically checks on her, and was concerned when she had not answered her phone. Pt stated that she still drives to the store, so her friend thought she had just missed her. Pt needs new cell phone, reports hers is broken. Is also suggested that she needs some type of life alert. Pt said that when she came into the ER they attempted to send her home. She seemed upset by this, stating that she was covered in bruises, could barely walk, and was un able to eat. When discussing after-discharge options, patient was agreeable to rehab, but stated that she did not want to go to [facility] or [place]. She was also concerned about the cost of ambulance transport, stating that it had not been fully covered before. MSW supervisor assured pt that she would make sure her insurance knew it was a medical necessity. Pt was informed that it may take about two more days to setup discharge. She was accepting to this, thanking MSW for letting her stay until she was comfortable to leave. Pt then stated that she had not had her medications this morning and was addiment that she needed them during meals, to stop diarrhea.  Pt is concerned about her weight and appears underweight. MSW checked in with nurse to report patient concerns.  Patient was alert, oriented and pleasant. She stated that she understood the discussion and appreciated the help. MSW will  follow.   Expected Discharge Plan: Skilled Nursing Facility Barriers to Discharge: Continued Medical Work up, Ship broker   Patient Goals and CMS Choice Patient states their goals for this hospitalization and ongoing recovery are:: Go to rehab to get stronger and go home. CMS Medicare.gov Compare Post Acute Care list provided to:: Patient Choice offered to / list presented to : Patient  Expected Discharge Plan and Services Expected Discharge Plan: [place] In-house Referral: Clinical Social Work Discharge Planning Services: NA Post Acute Care Choice: [place] Living arrangements for the past 2 months: [place]                                      Prior Living Arrangements/Services Living arrangements for the past 2 months: Single Family Home Lives with:: Self Patient language and need for interpreter reviewed:: Yes Do you feel safe going back to the place where you live?: Yes      Need for Family Participation in Patient Care: No (Comment) Care giver support system in place?: No (comment) Current home services: (none) Criminal Activity/Legal Involvement Pertinent to Current Situation/Hospitalization: No - Comment as needed  Activities of Daily Living      Permission Sought/Granted Permission sought to share information with : Facility Sport and exercise psychologist Permission granted to share information with : Yes,  Verbal Permission Granted     Permission granted to share info w AGENCY: Skilled Nursing Facilities        Emotional Assessment Appearance:: Appears stated age, Other (Comment Required(appears underweight) Attitude/Demeanor/Rapport: Engaged Affect (typically observed): Calm Orientation: : Oriented to Self, Oriented to Place, Oriented to  Time, Oriented to Situation(oriented x4) Alcohol / Substance Use: Other (comment)(Quit smoking in 1970) Psych Involvement: No (comment)  Admission diagnosis:   Generalized weakness [R53.1] Fall, initial encounter [W19.XXXA] Patient Active Problem List   Diagnosis Date Noted  . Weakness 12/20/2018  . Pressure injury of skin 12/20/2018  . Meningioma ([place]) 12/20/2018  . AKI (acute kidney injury) ([place]) 12/20/2018  . Fall at home, initial encounter 04/19/2017  . Bilateral hip dislocation ([place]) 04/19/2017  . Uncontrolled pain 04/19/2017  . Hypertension 04/19/2017  . Leukocytosis 04/19/2017  . Elevated troponin 04/19/2017  . Osteoarthritis of hand 01/01/2016  . 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 Unspecified osteoarthritis, unspecified site 01/01/2016  . S/p reverse total shoulder arthroplasty 08/27/2014  . Chronic diarrhea 12/22/2011   PCP:  Lajean Manes, MD Pharmacy:   [facility], [place] [address] Phone: [phone] Fax: [phone]     Social Determinants of Health (SDOH) Interventions    Readmission Risk Interventions No flowsheet data found.

## 2018-12-21 NOTE — Plan of Care (Signed)
  Problem: Education: Goal: Knowledge of General Education information will improve Description: Including pain rating scale, medication(s)/side effects and non-pharmacologic comfort measures Outcome: Progressing   Problem: Safety: Goal: Ability to remain free from injury will improve Outcome: Progressing   Problem: Skin Integrity: Goal: Risk for impaired skin integrity will decrease Outcome: Progressing   

## 2018-12-21 NOTE — Progress Notes (Signed)
Initial Nutrition Assessment  DOCUMENTATION CODES:   Not applicable  INTERVENTION:   Monitor magnesium, potassium, and phosphorus daily for at least 3 days, MD to replete as needed, as pt is at risk for refeeding syndrome given reported history of poor po intake by patients son per chart review. -Ensure Enlive po BID, each supplement provides 350 kcal and 20 grams of protein -Juven BID, each packet provides 95 calories, 2.5 grams of protein (collagen), and 9.8 grams of carbohydrate (3 grams sugar); also contains 7 grams of L-arginine and L-glutamine, 300 mg vitamin C, 15 mg vitamin E, 1.2 mcg vitamin B-12, 9.5 mg zinc, 200 mg calcium, and 1.5 g  Calcium Beta-hydroxy-Beta-methylbutyrate to support wound healing -MVI with minerals daily  -Current weight needed to assess wt status  NUTRITION DIAGNOSIS:   Increased nutrient needs related to wound healing as evidenced by estimated needs.  GOAL:   Patient will meet greater than or equal to 90% of their needs   MONITOR:   PO intake, Labs, I & O's, Supplement acceptance, Weight trends, Skin  REASON FOR ASSESSMENT:   Consult Assessment of nutrition requirement/status  ASSESSMENT:  RD working remotely.  85 year old female with past medical history significant of HTN,COPD, chronic diarrhea, GERD, osteoporosis, and peripheral neuropathy who presented to ED via EMS after a fall. Patient reports that she was on the ground for 2 days before a neighbor came over and found her. EMS left s/p patient refusal to come to ED and returned after patient fell again.  While in the ED patient was noted by nursing staff to have intermittent episodes of choking on fluids and applesauce. She was unable to ambulate and noted too weak to stand. MRI of brain ordered to rule out stroke. Patient admitted for continued IV hydration; SLP  PT/OT evaluation.  CT of head and C spine negative; MRI of brain negative for acute abnormality.  Bedside swallow evaluation  completed today, recommendations for regular diet;thin liquids per SLP.No documented meals since diet advancement, will continue to monitor. RD will provide Ensure to assist with calorie/protein needs as well as Juven to promote wound healing.  Per chart review, patient's son who lives in [place] reports that he and his mother consistently argue about the fact that she does not eat or drink, stating that she becomes incoherent when she doesn't eat. Son reports that patient has issues with hoarding and will likely refuse placement or [facility] services. Highly suspect patient is malnourished; full assessment to follow. Recommend monitoring Mg/Phos/K over the next few days, as patient is at increased risk for refeeding given reported history of poor oral intake per chart review.   Patient's current weight 54.8 kg was taken in 2019. RD has placed on order for weights to accurately assess weight status. Will continue to monitor and adjust estimated needs accordingly. Needs at this time are based on last available weight.   Medications reviewed Labs: K 3.1 - replacing  NUTRITION - FOCUSED PHYSICAL EXAM: Unable to complete at this time; RD working remotely.  Diet Order:   Diet Order            Diet regular Room service appropriate? Yes with Assist; Fluid consistency: Thin  Diet effective now              EDUCATION NEEDS:   No education needs have been identified at this time  Skin:  Skin Assessment: Reviewed RN Assessment(stage II; coccyx; fissure; coccyx; ecchymosis; arm; hip; chest; MASD; groin)  Last BM:  10/23  Height:   Ht Readings from Last 1 Encounters:  04/19/17 5\' 5"  (1.651 m)    Weight:   Wt Readings from Last 1 Encounters:  04/19/17 54.8 kg   Ideal Body Weight:  56.8 kg  BMI:  There is no height or weight on file to calculate BMI.  Estimated Nutritional Needs:   Kcal:  1600-1800  Protein:  80-90  Fluid:  > 1.5 L/day   Lajuan Lines, RD, LDN Clinical  [facility] Telephone [phone] After Hours/Weekend Pager: [phone]

## 2018-12-21 NOTE — Progress Notes (Signed)
Progress Note    [name]  [id number] DOB: 04-21-1930  DOA: 12/19/2018 PCP: Lajean Manes, MD    Brief Narrative:     Medical records reviewed and are as summarized below:  [name] is an 85 y.o. female with medical history significant of HTN; depression; and COPD presenting after a fall.  Her son reports that her friend called, she wasn't answering the phone in 3 days.  He lives in [place] and they don't talk frequently.  He suggested that they call 911 for a welfare check.  She was found down for 2 days, unable to get up.  She didn't go to the hospital the first time but they came back out again and transported her.  They have a "consistent argument about the fact that she doesn't eat or drink."    Assessment/Plan:   Principal Problem:   Weakness Active Problems:   Chronic diarrhea   Fall at home, initial encounter   Hypertension   Pressure injury of skin   Meningioma ([place])   AKI (acute kidney injury) ([place])   Weakness/fall -Patient who lives independently although questionable home safety presenting with fall -MRI unremarkable other than mildly enlarging meningioma -PT- SNF-- patient agreeable -UA resulted late and clearly appears to indicate dehydration (ketonuria) as well as UTI;   Rocephin-- will give x 3 days despite culture being multiple species -check TSH  AKI -1.5 -improved to 1.08 with hydration  Meningioma -This is chronic in nature although is increased in size from prior study -There is no surrounding edema or shift noted - Dr. Lorin Mercy d/w Dr. Annette Stable, but seems unlikely related to current presentation -Likely needs ongoing monitoring as an outpatient but not inpatient evaluation/treatment  Hypokalemia -replete  Chronic diarrhea -Appears to be meal-related -Takes robinol/Lomotil with meals (per patient given by Dr. Paulita Fujita as she is missing part of her colon)  HTN -She does not appear to be on medications for this issue -PRN  hydralazine for now  Pressure ulcer -WOC: Wound type: Stage 2 Pressure Injury Pressure Injury POA: Yes  Needs SNF so not safe for discharge  Family Communication/Anticipated D/C date and plan/Code Status   DVT prophylaxis: Lovenox ordered. Code Status: dnr Family Communication: Disposition Plan: SNF placement   Medical Consultants:    NS (phone)     Subjective:   Having loose stools as she has been without her medications  Objective:    Vitals:   12/20/18 2205 12/21/18 0539 12/21/18 0820 12/21/18 0842  BP: (!) 181/85 (!) 166/86 (!) 196/91 (!) 189/96  Pulse: 93 84 85 89  Resp:  16 16   Temp:  98.3 F (36.8 C) 98.6 F (37 C)   TempSrc:  Oral Oral   SpO2:  98% 97%     Intake/Output Summary (Last 24 hours) at 12/21/2018 1258 Last data filed at 12/21/2018 0639 Gross per 24 hour  Intake 1671.64 ml  Output 700 ml  Net 971.64 ml   There were no vitals filed for this visit.  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 Liver Function Tests: Recent Labs  Lab 12/20/18 0009  AST 69*  ALT 39  ALKPHOS 75  BILITOT 1.4*  PROT 7.1  ALBUMIN 3.8   No results for input(s): LIPASE, AMYLASE in the last 168 hours. No results for input(s): AMMONIA in the last 168 hours. Coagulation profile No results for input(s): INR, PROTIME in the last 168 hours.  CBC: Recent Labs  Lab 12/20/18 0009 12/21/18 0400  WBC 12.6* 9.2  NEUTROABS 11.3*  --   HGB 14.1 11.9*  HCT 43.6 33.4*  MCV 93.4 94.6  PLT 268 223    Cardiac Enzymes: Recent Labs  Lab 12/20/18 0009 12/21/18 0916  CKTOTAL 870* 257*   BNP (last 3 results) No results for input(s): PROBNP in the last 8760 hours. CBG: Recent Labs  Lab 12/20/18 0042  GLUCAP 102*   D-Dimer: No results for input(s): DDIMER in the last 72 hours. Hgb A1c: No results for input(s): HGBA1C in the last 72 hours. Lipid Profile: No results for input(s): CHOL, HDL, LDLCALC, TRIG, CHOLHDL, LDLDIRECT in the last 72 hours. Thyroid function studies: No results for input(s): TSH, T4TOTAL, T3FREE, THYROIDAB in the last 72 hours.  Invalid input(s): FREET3 Anemia work up: No results for input(s): VITAMINB12, FOLATE, FERRITIN, TIBC, IRON, RETICCTPCT in the last 72 hours. Sepsis Labs: Recent Labs  Lab 12/20/18 0009 12/21/18 0400  WBC 12.6* 9.2    Microbiology Recent Results (from the past 240 hour(s))  SARS CORONAVIRUS 2 (TAT 6-24 HRS) Nasopharyngeal Nasopharyngeal Swab     Status: None   Collection Time: 12/20/18  6:31 AM   Specimen: Nasopharyngeal Swab  Result Value Ref Range Status   SARS Coronavirus 2 NEGATIVE NEGATIVE Final    Comment: (NOTE) SARS-CoV-2 target nucleic acids are NOT DETECTED. The SARS-CoV-2 RNA is generally detectable in upper and lower respiratory specimens during the acute phase of infection. Negative results do not preclude SARS-CoV-2 infection, do not rule out co-infections with other pathogens, and should not be used as the sole basis for treatment or other patient management decisions. Negative results must be combined with clinical observations, patient history, and epidemiological information. The expected result is Negative. Fact Sheet for Patients: SugarRoll.be Fact Sheet for Healthcare Providers: https://www.woods-mathews.com/ This test is not yet approved or cleared by the [place] FDA and  has been authorized for detection and/or diagnosis of SARS-CoV-2 by FDA under  an Emergency Use Authorization (EUA). This EUA will remain  in effect (meaning this test can be used) for the duration of the COVID-19 declaration under Section 56 4(b)(1) of the Act, 21 U.S.C. section 360bbb-3(b)(1), unless the authorization is terminated or revoked sooner. Performed at [facility], [place] [address] [place], [place] [address]   Culture, Urine     Status: Abnormal   Collection Time: 12/20/18 12:54 PM   Specimen: Urine, Random  Result Value Ref Range Status   Specimen Description URINE, RANDOM  Final   Special Requests   Final    NONE Performed at [facility], [place] [address] [place], [place] [address]    Culture MULTIPLE SPECIES PRESENT, SUGGEST RECOLLECTION (A)  Final   Report Status 12/21/2018 FINAL  Final    Procedures and diagnostic studies:  Ct Head Wo Contrast  Result Date: 12/20/2018 CLINICAL DATA:  Fall at home. EXAM: CT HEAD WITHOUT CONTRAST CT CERVICAL SPINE WITHOUT CONTRAST TECHNIQUE: Multidetector CT imaging of the head and  cervical spine was performed following the standard protocol without intravenous contrast. Multiplanar CT image reconstructions of the cervical spine were also generated. COMPARISON:  04/19/2017, 06/05/2016 CT FINDINGS: CT HEAD FINDINGS Brain: No intracranial hemorrhage. Extra-axial left frontal hyperdense mass measuring 2.7 x 2.1 x 2.8 cm has slightly increased in size from prior exam where it measured 2.4 x 1.9 x 2.1 cm. Similar localized mass-effect on brain parenchyma. No midline shift. Age related atrophy. Chronic small vessel ischemia stable. 3 No hydrocephalus. The basilar cisterns are patent. No evidence of territorial infarct or acute ischemia. No extra-axial or intracranial fluid collection. Vascular: Atherosclerosis of skullbase vasculature without hyperdense vessel or abnormal calcification. Skull: No fracture or focal lesion. Small frontal calcified sebaceous cyst in the midline versus small exostosis. This is  unchanged. Sinuses/Orbits: Paranasal sinuses and mastoid air cells are clear. The visualized orbits are unremarkable. Other: None. CT CERVICAL SPINE FINDINGS Alignment: Normal. Skull base and vertebrae: No acute fracture. Vertebral body heights are maintained. The dens and skull base are intact. Soft tissues and spinal canal: No prevertebral fluid or swelling. No visible canal hematoma. Disc levels: Diffuse degenerative disc disease with disc space narrowing and endplate spurring. Multilevel facet hypertrophy. There is bony canal narrowing at the level of C5-C6 and to a lesser extent C6-C7. multilevel neural foraminal stenosis. Upper chest: No acute finding. Mild biapical pleuroparenchymal scarring. Other: Both shoulders included on the coronal reformats. Reverse left shoulder arthroplasty. High-riding humeral head abutting the undersurface of the acromion with associated degenerative change. IMPRESSION: 1. No acute intracranial abnormality. No skull fracture. 2. Known left frontal extra-axial hyperdense mass is most consistent with a meningioma, however has mildly increased in size since 2018 CT currently measuring 2.7 x 2.1 x 2.8 cm. 3. Multilevel degenerative change throughout the cervical spine without acute fracture or subluxation. Electronically Signed   By: Keith Rake M.D.   On: 12/20/2018 01:16   Ct Cervical Spine Wo Contrast  Result Date: 12/20/2018 CLINICAL DATA:  Fall at home. EXAM: CT HEAD WITHOUT CONTRAST CT CERVICAL SPINE WITHOUT CONTRAST TECHNIQUE: Multidetector CT imaging of the head and cervical spine was performed following the standard protocol without intravenous contrast. Multiplanar CT image reconstructions of the cervical spine were also generated. COMPARISON:  04/19/2017, 06/05/2016 CT FINDINGS: CT HEAD FINDINGS Brain: No intracranial hemorrhage. Extra-axial left frontal hyperdense mass measuring 2.7 x 2.1 x 2.8 cm has slightly increased in size from prior exam where it measured  2.4 x 1.9 x 2.1 cm. Similar localized mass-effect on brain parenchyma. No midline shift. Age related atrophy. Chronic small vessel ischemia stable. 3 No hydrocephalus. The basilar cisterns are patent. No evidence of territorial infarct or acute ischemia. No extra-axial or intracranial fluid collection. Vascular: Atherosclerosis of skullbase vasculature without hyperdense vessel or abnormal calcification. Skull: No fracture or focal lesion. Small frontal calcified sebaceous cyst in the midline versus small exostosis. This is unchanged. Sinuses/Orbits: Paranasal sinuses and mastoid air cells are clear. The visualized orbits are unremarkable. Other: None. CT CERVICAL SPINE FINDINGS Alignment: Normal. Skull base and vertebrae: No acute fracture. Vertebral body heights are maintained. The dens and skull base are intact. Soft tissues and spinal canal: No prevertebral fluid or swelling. No visible canal hematoma. Disc levels: Diffuse degenerative disc disease with disc space narrowing and endplate spurring. Multilevel facet hypertrophy. There is bony canal narrowing at the level of C5-C6 and to a lesser extent C6-C7. multilevel neural foraminal stenosis. Upper chest: No acute finding. Mild biapical pleuroparenchymal scarring. Other: Both shoulders included  on the coronal reformats. Reverse left shoulder arthroplasty. High-riding humeral head abutting the undersurface of the acromion with associated degenerative change. IMPRESSION: 1. No acute intracranial abnormality. No skull fracture. 2. Known left frontal extra-axial hyperdense mass is most consistent with a meningioma, however has mildly increased in size since 2018 CT currently measuring 2.7 x 2.1 x 2.8 cm. 3. Multilevel degenerative change throughout the cervical spine without acute fracture or subluxation. Electronically Signed   By: Keith Rake M.D.   On: 12/20/2018 01:16   Mr Brain Wo Contrast  Result Date: 12/20/2018 CLINICAL DATA:  Focal neuro deficit  with stroke suspected EXAM: MRI HEAD WITHOUT CONTRAST TECHNIQUE: Multiplanar, multiecho pulse sequences of the brain and surrounding structures were obtained without intravenous contrast. COMPARISON:  Head CT from earlier today FINDINGS: Brain: No acute infarction, hemorrhage, hydrocephalus, extra-axial collection. Confluent FLAIR hyperintensity in the cerebral white matter attributed to chronic small vessel ischemia. Small vessel ischemic type changes are also seen in the deep gray nuclei, especially the bilateral thalamus, and to a mild extent in the pons. Remote micro hemorrhages in the deep brain and bilateral cerebellum, a hypertensive pattern. Small remote left cerebellar infarct. Known extra-axial mass along the high and lateral left frontal lobe measuring 3.1 cm. The mass has enlarged since 2018 head CT when it measured 2 cm. No adjacent brain edema Vascular: Normal arterial flow voids. Likely incidental loss of flow void in the right sigmoid and transverse sinuses, often attributed to venous impingement in the neck. Skull and upper cervical spine: No focal marrow lesion. C2-3 and C3-4 disc and facet degeneration. Probable osteoma in the central forehead. Sinuses/Orbits: Negative IMPRESSION: 1. No emergent finding. 2. Extensive chronic small vessel ischemia with remote hypertensive pattern micro hemorrhages. 3. 3.1 cm meningioma along the high left frontal convexity which has enlarged from 2 cm in 2018. No adjacent brain edema. Electronically Signed   By: Monte Fantasia M.D.   On: 12/20/2018 08:11   Dg Chest Portable 1 View  Result Date: 12/20/2018 CLINICAL DATA:  Fall. EXAM: PORTABLE CHEST 1 VIEW COMPARISON:  04/18/2017 FINDINGS: Chronic hyperinflation. Biapical pleural parenchymal scarring. Unchanged heart size and mediastinal contours with aortic atherosclerosis. No acute airspace disease. Multiple skin folds project over the both hemi thoraces, no pneumothorax. No large pleural effusion. No  pulmonary edema. Left shoulder arthroplasty. Chronic right rotator cuff arthropathy. No acute osseous abnormalities are seen. IMPRESSION: 1. No acute abnormality or change from prior exam. 2. Stable chronic hyperinflation and biapical pleural parenchymal scarring. 3.  Aortic Atherosclerosis (ICD10-I70.0). Electronically Signed   By: Keith Rake M.D.   On: 12/20/2018 02:01    Medications:   . diphenoxylate-atropine  1 tablet Oral TID WC  . enoxaparin (LOVENOX) injection  40 mg Subcutaneous Q24H  . feeding supplement (ENSURE ENLIVE)  237 mL Oral BID BM  . glycopyrrolate  1 mg Oral TID AC  . multivitamin with minerals  1 tablet Oral Daily  . nutrition supplement (JUVEN)  1 packet Oral BID BM   Continuous Infusions: . cefTRIAXone (ROCEPHIN)  IV Stopped (12/20/18 1545)     LOS: 0 days   Geradine Girt  [facility]   How to contact the [facility] Attending or Consulting provider [place] or covering provider during after hours [place], for this patient?  1. Check the care team in [facility] and look for a) attending/consulting TR[facility] provider listed and b) the [facility] team listed 2. Log into [email or web address] and use [place]'s universal password to  access. If you do not have the password, please contact the hospital operator. 3. Locate the [facility] provider you are looking for under [facility] and page to a number that you can be directly reached. 4. If you still have difficulty reaching the provider, please page the [facility] (Director on Call) for the Hospitalists listed on amion for assistance.  12/21/2018, 12:58 PM

## 2018-12-21 NOTE — NC FL2 (Signed)
[place] MEDICAID FL2 LEVEL OF CARE SCREENING TOOL     IDENTIFICATION  Patient Name: [name] Birthdate: 1930-11-22 Sex: female Admission Date (Current Location): 12/19/2018  [place] and [place] Number:  Herbalist and Address:  [facility] [place], [place] [address], [place], [place] [address]      Provider Number: [id number]  Attending Physician Name and Address:  Geradine Girt, DO  Relative Name and Phone Number:  Chrissie Noa, son, [phone]    Current Level of Care: Hospital Recommended Level of Care: [place] Prior Approval Number:    Date Approved/Denied:   PASRR Number: [id number]  Discharge Plan: SNF    Current Diagnoses: Patient Active Problem List   Diagnosis Date Noted  . Weakness 12/20/2018  . Pressure injury of skin 12/20/2018  . Meningioma ([place]) 12/20/2018  . AKI (acute kidney injury) ([place]) 12/20/2018  . Fall at home, initial encounter 04/19/2017  . Bilateral hip dislocation ([place]) 04/19/2017  . Uncontrolled pain 04/19/2017  . Hypertension 04/19/2017  . Leukocytosis 04/19/2017  . Elevated troponin 04/19/2017  . Osteoarthritis of hand 01/01/2016  . 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 Unspecified osteoarthritis, unspecified site 01/01/2016  . S/p reverse total shoulder arthroplasty 08/27/2014  . Chronic diarrhea 12/22/2011    Orientation RESPIRATION BLADDER Height & Weight     Self, Time, Situation, Place  Normal Incontinent, External catheter Weight:   Height:     BEHAVIORAL SYMPTOMS/MOOD NEUROLOGICAL BOWEL NUTRITION STATUS      Incontinent(Rectal tube) Diet(Please see DC Summary)  AMBULATORY STATUS COMMUNICATION OF NEEDS Skin   Limited Assist Verbally PU Stage and Appropriate Care(Stage II on coccyx)                       Personal Care Assistance Level of Assistance  Bathing, Feeding, Dressing Bathing Assistance: Maximum assistance Feeding assistance: Independent Dressing Assistance:  Limited assistance     Functional Limitations Info  Sight, Hearing, Speech Sight Info: Adequate Hearing Info: Adequate Speech Info: Adequate    SPECIAL CARE FACTORS FREQUENCY  PT (By licensed PT), OT (By licensed OT)     PT Frequency: 5x OT Frequency: 5x            Contractures Contractures Info: Not present    Additional Factors Info  Code Status, Allergies Code Status Info: DNR Allergies Info: Hydromorphone, Lisinopril, Mirtazapine, Nitrofuran Derivatives, Septra (Bactrim)           Current Medications (12/21/2018):  This is the current hospital active medication list Current Facility-Administered Medications  Medication Dose Route Frequency Provider Last Rate Last Dose  . acetaminophen (TYLENOL) tablet 650 mg  650 mg Oral Q6H PRN Karmen Bongo, MD       Or  . acetaminophen (TYLENOL) suppository 650 mg  650 mg Rectal Q6H PRN Karmen Bongo, MD      . cefTRIAXone (ROCEPHIN) 1 g in sodium chloride 0.9 % 100 mL IVPB  1 g Intravenous Q24H Karmen Bongo, MD   Stopped at 12/20/18 1545  . enoxaparin (LOVENOX) injection 40 mg  40 mg Subcutaneous Q24H Karmen Bongo, MD   40 mg at 12/21/18 1014  . feeding supplement (ENSURE ENLIVE) (ENSURE ENLIVE) liquid 237 mL  237 mL Oral BID BM Vann, Jessica U, DO      . hydrALAZINE (APRESOLINE) injection 10 mg  10 mg Intravenous Q4H PRN Vann, Jessica U, DO   10 mg at 12/21/18 1014  . multivitamin with minerals tablet 1 tablet  1 tablet Oral Daily Vann, Jessica U, DO      . nutrition supplement (JUVEN) (JUVEN) powder packet 1 packet  1 packet Oral BID BM Vann, Jessica U, DO      . ondansetron (ZOFRAN) tablet 4 mg  4 mg Oral Q6H PRN Karmen Bongo, MD       Or  . ondansetron [facility]) injection 4 mg  4 mg Intravenous Q6H PRN Karmen Bongo, MD         Discharge Medications: Please see discharge summary for a list of discharge medications.  Relevant Imaging Results:  Relevant Lab Results:   Additional Information SS#  [id number]           COVID negative 10/23  Benard Halsted, LCSW

## 2018-12-21 NOTE — Care Management Obs Status (Signed)
[place] NOTIFICATION   Patient Details  Name: [name] MRN: [id number] Date of Birth: 1930-08-10   Medicare Observation Status Notification Given:  Yes    Carles Collet, RN 12/21/2018, 11:04 AM

## 2018-12-22 LAB — BASIC METABOLIC PANEL
Anion gap: 7 (ref 5–15)
BUN: 36 mg/dL — ABNORMAL HIGH (ref 8–23)
CO2: 25 mmol/L (ref 22–32)
Calcium: 9.1 mg/dL (ref 8.9–10.3)
Chloride: 108 mmol/L (ref 98–111)
Creatinine, Ser: 0.94 mg/dL (ref 0.44–1.00)
GFR calc Af Amer: >60 mL/min (ref >60)
GFR calc non Af Amer: 55 mL/min — ABNORMAL LOW (ref >60)
Glucose, Bld: 105 mg/dL — ABNORMAL HIGH (ref 70–99)
Potassium: 3.8 mmol/L (ref 3.5–5.1)
Sodium: 140 mmol/L (ref 135–145)

## 2018-12-22 LAB — CBC
HCT: 35.1 % — ABNORMAL LOW (ref 36.0–46.0)
Hemoglobin: 11.7 g/dL — ABNORMAL LOW (ref 12.0–15.0)
MCH: 31.3 pg (ref 26.0–34.0)
MCHC: 33.3 g/dL (ref 30.0–36.0)
MCV: 93.9 fL (ref 80.0–100.0)
Platelets: 216 10*3/uL (ref 150–400)
RBC: 3.74 MIL/uL — ABNORMAL LOW (ref 3.87–5.11)
RDW: 14 % (ref 11.5–15.5)
WBC: 8.1 10*3/uL (ref 4.0–10.5)
nRBC: 0 % (ref 0.0–0.2)

## 2018-12-22 LAB — MAGNESIUM: Magnesium: 1.9 mg/dL (ref 1.7–2.4)

## 2018-12-22 NOTE — Progress Notes (Addendum)
Progress Note    [name]  [id number] DOB: 02/15/1931  DOA: 12/19/2018 PCP: Lajean Manes, MD    Brief Narrative:     Medical records reviewed and are as summarized below:  [name] is an 85 y.o. female with medical history significant of HTN; depression; and COPD presenting after a fall.  Her son reports that her friend called, she wasn't answering the phone in 3 days.  He lives in [place] and they don't talk frequently.  He suggested that they call 911 for a welfare check.  She was found down for 2 days, unable to get up.  She didn't go to the hospital the first time but they came back out again and transported her.  They have a "consistent argument about the fact that she doesn't eat or drink."    Assessment/Plan:   Principal Problem:   Weakness Active Problems:   Chronic diarrhea   Fall at home, initial encounter   Hypertension   Pressure injury of skin   Meningioma ([place])   AKI (acute kidney injury) ([place])   Fall   Weakness/fall -Patient who lives independently although questionable home safety presenting with fall -MRI unremarkable other than mildly enlarging meningioma -PT- SNF-- patient agreeable -UA resulted late and clearly appears to indicate dehydration (ketonuria) as well as UTI;   Rocephin-- will give x 3 days despite culture being multiple species -TSH normal  AKI -1.5 -improved with hydration  Meningioma -This is chronic in nature although is increased in size from prior study -There is no surrounding edema or shift noted - Dr. Lorin Mercy d/w Dr. Annette Stable, but seems unlikely related to current presentation -Likely needs ongoing monitoring as an outpatient but not inpatient evaluation/treatment  Hypokalemia -repleted  Chronic diarrhea -Appears to be meal-related -Takes robinol/Lomotil with meals (per patient given by Dr. Paulita Fujita as she is missing part of her colon)  HTN -She does not appear to be on medications for this issue -PRN  hydralazine for now  Pressure ulcer -WOC: Wound type: Stage 2 Pressure Injury: coccyx Pressure Injury POA: Yes  Needs SNF so not safe for discharge yet  Family Communication/Anticipated D/C date and plan/Code Status   DVT prophylaxis: Lovenox ordered. Code Status: dnr Family Communication: Disposition Plan: SNF placement   Medical Consultants:    NS (phone)     Subjective:   Says she is very sleepy this AM Appears to have eaten 100% of meals  Objective:    Vitals:   12/22/18 0129 12/22/18 0516 12/22/18 0516 12/22/18 0932  BP: 137/79 (!) 154/96 (!) 154/96 (!) 167/85  Pulse: (!) 106 94 92 94  Resp:  16 16 16   Temp:  98 F (36.7 C) 98 F (36.7 C) 98.9 F (37.2 C)  TempSrc:  Oral Oral Oral  SpO2:  98% 97% 97%  Weight:   59 kg     Intake/Output Summary (Last 24 hours) at 12/22/2018 1221 Last data filed at 12/22/2018 0935 Gross per 24 hour  Intake 515 ml  Output 850 ml  Net -335 ml   Filed Weights   12/22/18 0516  Weight: 59 kg    Exam: In bed, poor dentition Lips dry [place] today cachexic    Data Reviewed:   I have personally reviewed following labs and imaging studies:  Labs: Labs show the following:   Basic Metabolic Panel: Recent Labs  Lab 12/20/18 0009 12/21/18 0400 12/22/18 0542  NA 140 145 140  K 3.8 3.1* 3.8  CL  100 112* 108  CO2 22 20* 25  GLUCOSE 107* 74 105*  BUN 43* 27* 36*  CREATININE 1.50* 1.08* 0.94  CALCIUM 9.9 8.9 9.1   GFR CrCl cannot be calculated (Unknown ideal weight.). Liver Function Tests: Recent Labs  Lab 12/20/18 0009  AST 69*  ALT 39  ALKPHOS 75  BILITOT 1.4*  PROT 7.1  ALBUMIN 3.8   No results for input(s): LIPASE, AMYLASE in the last 168 hours. No results for input(s): AMMONIA in the last 168 hours. Coagulation profile No results for input(s): INR, PROTIME in the last 168 hours.  CBC: Recent Labs  Lab 12/20/18 0009 12/21/18 0400 12/22/18 0542  WBC 12.6* 9.2 8.1  NEUTROABS 11.3*  --    --   HGB 14.1 11.9* 11.7*  HCT 43.6 33.4* 35.1*  MCV 93.4 94.6 93.9  PLT 268 223 216   Cardiac Enzymes: Recent Labs  Lab 12/20/18 0009 12/21/18 0916  CKTOTAL 870* 257*   BNP (last 3 results) No results for input(s): PROBNP in the last 8760 hours. CBG: Recent Labs  Lab 12/20/18 0042  GLUCAP 102*   D-Dimer: No results for input(s): DDIMER in the last 72 hours. Hgb A1c: No results for input(s): HGBA1C in the last 72 hours. Lipid Profile: No results for input(s): CHOL, HDL, LDLCALC, TRIG, CHOLHDL, LDLDIRECT in the last 72 hours. Thyroid function studies: Recent Labs    12/21/18 1240  TSH 0.908   Anemia work up: No results for input(s): VITAMINB12, FOLATE, FERRITIN, TIBC, IRON, RETICCTPCT in the last 72 hours. Sepsis Labs: Recent Labs  Lab 12/20/18 0009 12/21/18 0400 12/22/18 0542  WBC 12.6* 9.2 8.1    Microbiology Recent Results (from the past 240 hour(s))  SARS CORONAVIRUS 2 (TAT 6-24 HRS) Nasopharyngeal Nasopharyngeal Swab     Status: None   Collection Time: 12/20/18  6:31 AM   Specimen: Nasopharyngeal Swab  Result Value Ref Range Status   SARS Coronavirus 2 NEGATIVE NEGATIVE Final    Comment: (NOTE) SARS-CoV-2 target nucleic acids are NOT DETECTED. The SARS-CoV-2 RNA is generally detectable in upper and lower respiratory specimens during the acute phase of infection. Negative results do not preclude SARS-CoV-2 infection, do not rule out co-infections with other pathogens, and should not be used as the sole basis for treatment or other patient management decisions. Negative results must be combined with clinical observations, patient history, and epidemiological information. The expected result is Negative. Fact Sheet for Patients: SugarRoll.be Fact Sheet for Healthcare Providers: https://www.woods-mathews.com/ This test is not yet approved or cleared by the [place] FDA and  has been authorized for  detection and/or diagnosis of SARS-CoV-2 by FDA under an Emergency Use Authorization (EUA). This EUA will remain  in effect (meaning this test can be used) for the duration of the COVID-19 declaration under Section 56 4(b)(1) of the Act, 21 U.S.C. section 360bbb-3(b)(1), unless the authorization is terminated or revoked sooner. Performed at [facility], [place] [address] [place], [place] [address]   Culture, Urine     Status: Abnormal   Collection Time: 12/20/18 12:54 PM   Specimen: Urine, Random  Result Value Ref Range Status   Specimen Description URINE, RANDOM  Final   Special Requests   Final    NONE Performed at [facility], [place] [address], [place], [place] [address]    Culture MULTIPLE SPECIES PRESENT, SUGGEST RECOLLECTION (A)  Final   Report Status 12/21/2018 FINAL  Final    Procedures and diagnostic studies:  No  results found.  Medications:   . diphenoxylate-atropine  1 tablet Oral TID WC  . enoxaparin (LOVENOX) injection  40 mg Subcutaneous Q24H  . feeding supplement (ENSURE ENLIVE)  237 mL Oral BID BM  . glycopyrrolate  1 mg Oral TID AC  . multivitamin with minerals  1 tablet Oral Daily  . nutrition supplement (JUVEN)  1 packet Oral BID BM   Continuous Infusions: . cefTRIAXone (ROCEPHIN)  IV 1 g (12/21/18 1347)     LOS: 1 day   Geradine Girt  [facility]   How to contact the [facility] Attending or Consulting provider [place] or covering provider during after hours [place], for this patient?  1. Check the care team in [facility] and look for a) attending/consulting TR[facility] provider listed and b) the [facility] team listed 2. Log into [email or web address] and use [place]'s universal password to access. If you do not have the password, please contact the hospital operator. 3. Locate the [facility] provider you are looking for under [facility] and page to a number that you can be directly reached. 4. If you still have difficulty reaching the provider, please page the  [facility] (Director on Call) for the Hospitalists listed on amion for assistance.  12/22/2018, 12:21 PM

## 2018-12-23 MED ORDER — AMLODIPINE BESYLATE 5 MG PO TABS
5.0000 mg | ORAL_TABLET | Freq: Every day | ORAL | Status: DC
  Administered 2018-12-23 – 2018-12-24 (×2): 5 mg via ORAL
  Filled 2018-12-23 (×2): qty 1

## 2018-12-23 NOTE — TOC Progression Note (Signed)
Transition of Care [facility]) - Progression Note    Patient Details  Name: [name] MRN: [id number] Date of Birth: [birth year]  Transition of Care [facility]) CM/SW [place], [place] Phone Number: 12/23/2018, 5:32 PM  Clinical Narrative:     RN informed CSW of the patient's bed choice of [place]. CSW will start insurance authorization on Tuesday.   CSW will continue to follow and assist with disposition.   Expected Discharge Plan: [place] Barriers to Discharge: Continued Medical Work up, Ship broker  Expected Discharge Plan and Services Expected Discharge Plan: [place] In-house Referral: Clinical Social Work Discharge Planning Services: NA Post Acute Care Choice: [place] Living arrangements for the past 2 months: Single Family Home                                       Social Determinants of Health (SDOH) Interventions    Readmission Risk Interventions No flowsheet data found.

## 2018-12-23 NOTE — TOC Progression Note (Signed)
Transition of Care [facility]) - Progression Note    Patient Details  Name: [name] MRN: [id number] Date of Birth: 16-Jun-1930  Transition of Care [facility]) CM/SW [place], [place] Phone Number: 12/23/2018, 2:01 PM  Clinical Narrative:     Provided bed offers to the patient and the patient's son. Wanted the night to look up ratings and make a decision. Informed them that they must decide by tomorrow so that insurance can be started.   CSW will continue to follow.   Expected Discharge Plan: [place] Barriers to Discharge: Continued Medical Work up, Ship broker  Expected Discharge Plan and Services Expected Discharge Plan: [place] In-house Referral: Clinical Social Work Discharge Planning Services: NA Post Acute Care Choice: [place] Living arrangements for the past 2 months: Single Family Home                                       Social Determinants of Health (SDOH) Interventions    Readmission Risk Interventions No flowsheet data found.

## 2018-12-23 NOTE — Progress Notes (Signed)
PROGRESS NOTE  [name] [id number] DOB: 04-28-1930 DOA: 12/19/2018 PCP: Lajean Manes, MD  HPI/Recap of past 24 hours: [name] is an 85 y.o. female with medical history significant ofHTN; depression; and COPD presenting after a fall.Her son reports that her friend called, she wasn't answering the phone in 3 days. He lives in [place] and they don't talk frequently. He suggested that they call 911 for a welfare check. She was found down for 2 days, unable to get up. She didn't go to the hospital the first time but they came back out again and transported her. They have a "consistent argument about the fact that she doesn't eat or drink."   12/23/18: Patient was seen and examined at her bedside this morning.  She is alert and interactive.  She has no new complaints.  OT assessment recommended SNF.  CSW assisting with placement.  Assessment/Plan: Principal Problem:   Weakness Active Problems:   Chronic diarrhea   Fall at home, initial encounter   Hypertension   Pressure injury of skin   Meningioma ([facility])   AKI (acute kidney injury) ([place])   Fall  Generalized weakness/fall -Patient who lives independently although questionable home safety presenting with fall -MRI unremarkable other than mildly enlarging meningioma -PT- SNF-- patient agreeable -UA resulted late and clearly appears to indicate dehydration (ketonuria) as well as UTI;   Rocephin-- will give x 3 days despite culture being multiple species -TSH normal  Transient elevated blood pressure Not on antihypertensive medications at home Blood pressure remains consistently elevated Start Norvasc 5 mg daily  Resolved AKI, likely prerenal in the setting of poor oral intake and dehydration Baseline creatinine appears to be 0.9 with GFR greater than 60 Presented with creatinine of 1.5 with GFR of 31 Continue to avoid nephrotoxins  Meningioma -This is chronic in nature although is increased in size from  prior study -There is no surrounding edema or shift noted - Dr. Lorin Mercy d/w Dr. Annette Stable, but seems unlikely related to current presentation -Likely needs ongoing monitoring as an outpatient but not inpatient evaluation/treatment  Resolved post repletion: Hypokalemia -repleted  Chronic diarrhea -Appears to be meal-related -Takes robinol/Lomotil with meals (per patient given by Dr. Paulita Fujita as she is missing part of her colon)  Pressure ulcer -WOC: Wound type:Stage 2 Pressure Injury: coccyx Pressure Injury POA: Yes  Ambulatory dysfunction with fall PT OT recommended SNF CSW assisting with SNF SNF placement Continue physical therapy with fall precautions   Family Communication/Anticipated D/C date and plan/Code Status   DVT prophylaxis: Lovenox sq daily Code Status: dnr Family Communication: Disposition Plan: SNF placement possibly tomorrow 12/24/2018, pending insurance authorization and bed placement.   Medical Consultants:    NS (phone)        Objective: Vitals:   12/22/18 1747 12/22/18 2001 12/23/18 0649 12/23/18 0942  BP: (!) 189/103 131/68 (!) 160/73 (!) 155/78  Pulse: 97 (!) 105 84 99  Resp: 18 17 18 20   Temp: 98.2 F (36.8 C) 98.2 F (36.8 C) 98.7 F (37.1 C) 98.8 F (37.1 C)  TempSrc: Oral Oral Oral Oral  SpO2: 97% 98% 98% 98%  Weight:  58.9 kg      Intake/Output Summary (Last 24 hours) at 12/23/2018 1450 Last data filed at 12/23/2018 1240 Gross per 24 hour  Intake 680 ml  Output 0 ml  Net 680 ml   Filed Weights   12/22/18 0516 12/22/18 2001  Weight: 59 kg 58.9 kg    Exam:  .  General: 85 y.o. year-old female well developed well nourished in no acute distress.  Alert and interactive. . Cardiovascular: Regular rate and rhythm with no rubs or gallops.  No thyromegaly or JVD noted.   Marland Kitchen Respiratory: Clear to auscultation with no wheezes or rales. Good inspiratory effort. . Abdomen: Soft nontender nondistended with normal bowel sounds x4  quadrants. . Musculoskeletal: No lower extremity edema. 2/4 pulses in all 4 extremities. Marland Kitchen Psychiatry: Mood is appropriate for condition and setting   Data Reviewed: CBC: Recent Labs  Lab 12/20/18 0009 12/21/18 0400 12/22/18 0542  WBC 12.6* 9.2 8.1  NEUTROABS 11.3*  --   --   HGB 14.1 11.9* 11.7*  HCT 43.6 33.4* 35.1*  MCV 93.4 94.6 93.9  PLT 268 223 [id number]   Basic Metabolic Panel: Recent Labs  Lab 12/20/18 0009 12/21/18 0400 12/22/18 0542  NA 140 145 140  K 3.8 3.1* 3.8  CL 100 112* 108  CO2 22 20* 25  GLUCOSE 107* 74 105*  BUN 43* 27* 36*  CREATININE 1.50* 1.08* 0.94  CALCIUM 9.9 8.9 9.1  MG  --   --  1.9   GFR: CrCl cannot be calculated (Unknown ideal weight.). Liver Function Tests: Recent Labs  Lab 12/20/18 0009  AST 69*  ALT 39  ALKPHOS 75  BILITOT 1.4*  PROT 7.1  ALBUMIN 3.8   No results for input(s): LIPASE, AMYLASE in the last 168 hours. No results for input(s): AMMONIA in the last 168 hours. Coagulation Profile: No results for input(s): INR, PROTIME in the last 168 hours. Cardiac Enzymes: Recent Labs  Lab 12/20/18 0009 12/21/18 0916  CKTOTAL 870* 257*   BNP (last 3 results) No results for input(s): PROBNP in the last 8760 hours. HbA1C: No results for input(s): HGBA1C in the last 72 hours. CBG: Recent Labs  Lab 12/20/18 0042  GLUCAP 102*   Lipid Profile: No results for input(s): CHOL, HDL, LDLCALC, TRIG, CHOLHDL, LDLDIRECT in the last 72 hours. Thyroid Function Tests: Recent Labs    12/21/18 1240  TSH 0.908   Anemia Panel: No results for input(s): VITAMINB12, FOLATE, FERRITIN, TIBC, IRON, RETICCTPCT in the last 72 hours. Urine analysis:    Component Value Date/Time   COLORURINE YELLOW 12/20/2018 1254   APPEARANCEUR CLOUDY (A) 12/20/2018 1254   LABSPEC 1.015 12/20/2018 1254   PHURINE 8.0 12/20/2018 1254   GLUCOSEU 100 (A) 12/20/2018 1254   HGBUR LARGE (A) 12/20/2018 1254   BILIRUBINUR MODERATE (A) 12/20/2018 1254   KETONESUR  15 (A) 12/20/2018 1254   PROTEINUR 100 (A) 12/20/2018 1254   UROBILINOGEN 0.2 10/30/2014 1728   NITRITE POSITIVE (A) 12/20/2018 1254   LEUKOCYTESUR MODERATE (A) 12/20/2018 1254   Sepsis Labs: @LABRCNTIP (procalcitonin:4,lacticidven:4)  ) Recent Results (from the past 240 hour(s))  SARS CORONAVIRUS 2 (TAT 6-24 HRS) Nasopharyngeal Nasopharyngeal Swab     Status: None   Collection Time: 12/20/18  6:31 AM   Specimen: Nasopharyngeal Swab  Result Value Ref Range Status   SARS Coronavirus 2 NEGATIVE NEGATIVE Final    Comment: (NOTE) SARS-CoV-2 target nucleic acids are NOT DETECTED. The SARS-CoV-2 RNA is generally detectable in upper and lower respiratory specimens during the acute phase of infection. Negative results do not preclude SARS-CoV-2 infection, do not rule out co-infections with other pathogens, and should not be used as the sole basis for treatment or other patient management decisions. Negative results must be combined with clinical observations, patient history, and epidemiological information. The expected result is Negative. Fact Sheet for Patients: SugarRoll.be  Fact Sheet for Healthcare Providers: https://www.woods-mathews.com/ This test is not yet approved or cleared by the [place] FDA and  has been authorized for detection and/or diagnosis of SARS-CoV-2 by FDA under an Emergency Use Authorization (EUA). This EUA will remain  in effect (meaning this test can be used) for the duration of the COVID-19 declaration under Section 56 4(b)(1) of the Act, 21 U.S.C. section 360bbb-3(b)(1), unless the authorization is terminated or revoked sooner. Performed at [facility], [place] [address] [place], [place] [address]   Culture, Urine     Status: Abnormal   Collection Time: 12/20/18 12:54 PM   Specimen: Urine, Random  Result Value Ref Range Status   Specimen Description URINE, RANDOM  Final   Special Requests   Final     NONE Performed at [facility], [place] [address], [place], [place] [address]    Culture MULTIPLE SPECIES PRESENT, SUGGEST RECOLLECTION (A)  Final   Report Status 12/21/2018 FINAL  Final      Studies: No results found.  Scheduled Meds: . diphenoxylate-atropine  1 tablet Oral TID WC  . enoxaparin (LOVENOX) injection  40 mg Subcutaneous Q24H  . feeding supplement (ENSURE ENLIVE)  237 mL Oral BID BM  . glycopyrrolate  1 mg Oral TID AC  . multivitamin with minerals  1 tablet Oral Daily  . nutrition supplement (JUVEN)  1 packet Oral BID BM    Continuous Infusions:   LOS: 2 days     Kayleen Memos, MD [facility] Pager [phone]  If 7PM-7AM, please contact night-coverage [email or web address] Password [email or web address] 12/23/2018, 2:50 PM

## 2018-12-24 MED ORDER — ADULT MULTIVITAMIN W/MINERALS CH: 1.0000 | ORAL_TABLET | Freq: Every day | ORAL | 0 refills | Status: AC

## 2018-12-24 MED ORDER — ENSURE ENLIVE PO LIQD: 237.0000 mL | Freq: Two times a day (BID) | ORAL | 0 refills | Status: AC

## 2018-12-24 MED ORDER — AMLODIPINE BESYLATE 5 MG PO TABS
5.0000 mg | ORAL_TABLET | Freq: Once | ORAL | Status: AC
  Administered 2018-12-24: 5 mg via ORAL
  Filled 2018-12-24: qty 1

## 2018-12-24 MED ORDER — AMLODIPINE BESYLATE 10 MG PO TABS: 10.0000 mg | ORAL_TABLET | Freq: Every day | ORAL | 0 refills | Status: DC

## 2018-12-24 MED ORDER — GLYCOPYRROLATE 1 MG PO TABS: 1.0000 mg | ORAL_TABLET | Freq: Three times a day (TID) | ORAL | 0 refills | Status: DC

## 2018-12-24 MED ORDER — JUVEN PO PACK: 1.0000 | PACK | Freq: Two times a day (BID) | ORAL | 0 refills | Status: DC

## 2018-12-24 MED ORDER — DIPHENOXYLATE-ATROPINE 2.5-0.025 MG PO TABS: 1.0000 | ORAL_TABLET | Freq: Three times a day (TID) | ORAL | 0 refills | Status: DC

## 2018-12-24 MED ORDER — AMLODIPINE BESYLATE 10 MG PO TABS: 10.0000 mg | ORAL_TABLET | Freq: Every day | ORAL | Status: DC

## 2018-12-24 MED ORDER — AMLODIPINE BESYLATE 5 MG PO TABS: 5.0000 mg | ORAL_TABLET | Freq: Every day | ORAL | 0 refills | Status: DC

## 2018-12-24 NOTE — Discharge Summary (Signed)
Discharge Summary  [name] [id number] DOB: 1930-05-11  PCP: Lajean Manes, MD  Admit date: 12/19/2018 Discharge date: 12/24/2018  Time spent: 35 minutes  Recommendations for Outpatient Follow-up:  1. Follow-up with your PCP 2. Continue physical therapy 3. Fall precautions 4. Take your medications as prescribed  Discharge Diagnoses:  Active Hospital Problems   Diagnosis Date Noted  . Weakness 12/20/2018  . Fall 12/21/2018  . Pressure injury of skin 12/20/2018  . Meningioma ([facility]) 12/20/2018  . AKI (acute kidney injury) ([facility]) 12/20/2018  . Fall at home, initial encounter 04/19/2017  . Hypertension 04/19/2017  . Chronic diarrhea 12/22/2011    Resolved Hospital Problems  No resolved problems to display.    Discharge Condition: Stable  Diet recommendation: Resume previous diet  Vitals:   12/24/18 0653 12/24/18 0900  BP: (!) 156/99 (!) 150/94  Pulse: 90 86  Resp:  17  Temp:  97.8 F (36.6 C)  SpO2:  98%    History of present illness:  [name] an 85 y.o.femalewith medical history significant ofHTN; depression; and COPD presenting after a fall.Her son reports that her friend called, she wasn't answering the phone in 3 days. He lives in [place] and they don't talk frequently. He suggested that they call 911 for a welfare check. She was found down for 2 days, unable to get up. She didn't go to the hospital the first time but they came back out again and transported her. They have a "consistent argument about the fact that she doesn't eat or drink."   Presented with AKI which has resolved.  Generalized weakness for which PT OT recommended SNF.  CSW assisting with placement.  12/24/18:Patient was seen and examined at her bedside this morning.  No acute events overnight.  She is alert oriented x4.  No new complaints.  Vital signs and labs reviewed and are stable.  Patient is appropriate for discharge to SNF.  Awaiting insurance  authorization.   Hospital Course:  Principal Problem:   Weakness Active Problems:   Chronic diarrhea   Fall at home, initial encounter   Hypertension   Pressure injury of skin   Meningioma ([facility])   AKI (acute kidney injury) ([facility])   Fall  Generalized weakness/fall -Patient who lives independently although questionable home safety presenting with fall -MRI unremarkable other than mildly enlarging meningioma -PT- SNF-- patient agreeable -UA resulted late and clearly appears to indicate dehydration (ketonuria) as well as UTI; Rocephin-- will give x 3 days despite culture being multiple species -TSHnormal  Transient elevated blood pressure Not on antihypertensive medications at home Blood pressure remains consistently elevated Continue Norvasc increase dose to 10 mg daily.  Follow-up with your PCP  Resolved AKI with IV fluid hydration, likely prerenal in the setting of poor oral intake and dehydration Baseline creatinine appears to be 0.9 with GFR greater than 60 Presented with creatinine of 1.5 with GFR of 31 Creatinine 0.9 with GFR 55 on 12/22/2018. Continue to avoid nephrotoxins, avoid dehydration Follow-up with your PCP  Meningioma -This is chronic in nature although is increased in size from prior study -There is no surrounding edema or shift noted - Dr. Lorin Mercy d/w Dr. Annette Stable, but seems unlikely related to current presentation -Likely needs ongoing monitoring as an outpatient but not inpatient evaluation/treatment  Resolved post repletion: Hypokalemia -repleted  Chronic diarrhea -Appears to be meal-related -Takes robinol/Lomotil with meals (per patient given by Dr. Paulita Fujita as she is missing part of her colon)  Pressure ulcer -WOC: Wound  type:Stage 2 Pressure Injury: coccyx Pressure Injury POA: Yes Recommendations per wound care specialist: [place] Nurse wound consult note Consultation was completed by review of records, images and assistance from the bedside  nurse/clinical staff.   Reason for Consult: pressure injury MD reports to [facility] Stage 1 pressure injury per staff. Contacted bedside nurse, she has added to  Nursing FS Wound type: Stage 2 Pressure Injury Pressure Injury POA: Yes Below per bedside nurse communication Measurement: 2cm x 0.7cm x0 Wound bed: clean and pink Drainage (amount, consistency, odor) none Periwound: intact  Dressing procedure/placement/frequency: Silicone foam implemented per skin care order set. Will update orders.    Discussed POC with patient and bedside nurse.  Re consult if needed, will not follow at this time. Thanks  Melody [employer] MSN, RN,CWOCN, CNS, CWON-AP [phone])  Ambulatory dysfunction with fall PT OT recommended SNF CSW assisting with SNF SNF placement Continue physical therapy with fall precautions   Family Communication/Anticipated D/C date and plan/Code Status    Code Status:DNR    Discharge Exam: BP (!) 150/94 (BP Location: Left Arm)   Pulse 86   Temp 97.8 F (36.6 C) (Oral)   Resp 17   Wt 58.9 kg   SpO2 98%   BMI 21.61 kg/m  . General: 85 y.o. year-old female well developed well nourished in no acute distress.  Alert and oriented x4. . Cardiovascular: Regular rate and rhythm with no rubs or gallops.  No thyromegaly or JVD noted.   Marland Kitchen Respiratory: Clear to auscultation with no wheezes or rales. Good inspiratory effort. . Abdomen: Soft nontender nondistended with normal bowel sounds x4 quadrants. . Musculoskeletal: No lower extremity edema. 2/4 pulses in all 4 extremities. Marland Kitchen Psychiatry: Mood is appropriate for condition and setting  Discharge Instructions You were cared for by a hospitalist during your hospital stay. If you have any questions about your discharge medications or the care you received while you were in the hospital after you are discharged, you can call the unit and asked to speak with the hospitalist on call if the hospitalist that took care of you is  not available. Once you are discharged, your primary care physician will handle any further medical issues. Please note that NO REFILLS for any discharge medications will be authorized once you are discharged, as it is imperative that you return to your primary care physician (or establish a relationship with a primary care physician if you do not have one) for your aftercare needs so that they can reassess your need for medications and monitor your lab values.   Allergies as of 12/24/2018      Reactions   Hydromorphone    Pt does not remember   Lisinopril    fatigue   Mirtazapine    Pt doesn't remember.     Nitrofuran Derivatives Diarrhea   Septra [bactrim]    Pt doesn't remember.       Medication List    STOP taking these medications   Myrbetriq 50 MG Tb24 tablet Generic drug: mirabegron ER     TAKE these medications   amLODipine 10 MG tablet Commonly known as: NORVASC Take 1 tablet (10 mg total) by mouth daily. Start taking on: December 25, 2018   diphenoxylate-atropine 2.5-0.025 MG tablet Commonly known as: LOMOTIL Take 1 tablet by mouth 3 (three) times daily before meals. What changed: how much to take   glycopyrrolate 1 MG tablet Commonly known as: ROBINUL Take 1 tablet (1 mg total) by mouth 3 (three) times daily  before meals.   multivitamin with minerals Tabs tablet Take 1 tablet by mouth daily. Start taking on: December 25, 2018   nutrition supplement (JUVEN) Pack Take 1 packet by mouth 2 (two) times daily between meals.   feeding supplement (ENSURE ENLIVE) Liqd Take 237 mLs by mouth 2 (two) times daily between meals for 7 days.      Allergies  Allergen Reactions  . Hydromorphone     Pt does not remember  . Lisinopril     fatigue  . Mirtazapine     Pt doesn't remember.    . Nitrofuran Derivatives Diarrhea  . Septra [Bactrim]     Pt doesn't remember.     Contact information for follow-up providers    Lajean Manes, MD. Call in 1 day(s).    Specialty: Internal Medicine Why: Please call for a post hospital follow-up appointment. Contact information: [address] [place] [address] [phone]            Contact information for after-discharge care    Destination    [facility] .   Service: Skilled Nursing Contact information: [address] [place] [phone]                   The results of significant diagnostics from this hospitalization (including imaging, microbiology, ancillary and laboratory) are listed below for reference.    Significant Diagnostic Studies: Ct Head Wo Contrast  Result Date: 12/20/2018 CLINICAL DATA:  Fall at home. EXAM: CT HEAD WITHOUT CONTRAST CT CERVICAL SPINE WITHOUT CONTRAST TECHNIQUE: Multidetector CT imaging of the head and cervical spine was performed following the standard protocol without intravenous contrast. Multiplanar CT image reconstructions of the cervical spine were also generated. COMPARISON:  04/19/2017, 06/05/2016 CT FINDINGS: CT HEAD FINDINGS Brain: No intracranial hemorrhage. Extra-axial left frontal hyperdense mass measuring 2.7 x 2.1 x 2.8 cm has slightly increased in size from prior exam where it measured 2.4 x 1.9 x 2.1 cm. Similar localized mass-effect on brain parenchyma. No midline shift. Age related atrophy. Chronic small vessel ischemia stable. 3 No hydrocephalus. The basilar cisterns are patent. No evidence of territorial infarct or acute ischemia. No extra-axial or intracranial fluid collection. Vascular: Atherosclerosis of skullbase vasculature without hyperdense vessel or abnormal calcification. Skull: No fracture or focal lesion. Small frontal calcified sebaceous cyst in the midline versus small exostosis. This is unchanged. Sinuses/Orbits: Paranasal sinuses and mastoid air cells are clear. The visualized orbits are unremarkable. Other: None. CT CERVICAL SPINE FINDINGS Alignment:  Normal. Skull base and vertebrae: No acute fracture. Vertebral body heights are maintained. The dens and skull base are intact. Soft tissues and spinal canal: No prevertebral fluid or swelling. No visible canal hematoma. Disc levels: Diffuse degenerative disc disease with disc space narrowing and endplate spurring. Multilevel facet hypertrophy. There is bony canal narrowing at the level of C5-C6 and to a lesser extent C6-C7. multilevel neural foraminal stenosis. Upper chest: No acute finding. Mild biapical pleuroparenchymal scarring. Other: Both shoulders included on the coronal reformats. Reverse left shoulder arthroplasty. High-riding humeral head abutting the undersurface of the acromion with associated degenerative change. IMPRESSION: 1. No acute intracranial abnormality. No skull fracture. 2. Known left frontal extra-axial hyperdense mass is most consistent with a meningioma, however has mildly increased in size since 2018 CT currently measuring 2.7 x 2.1 x 2.8 cm. 3. Multilevel degenerative change throughout the cervical spine without acute fracture or subluxation. Electronically Signed   By: Threasa Beards  Sanford M.D.   On: 12/20/2018 01:16   Ct Cervical Spine Wo Contrast  Result Date: 12/20/2018 CLINICAL DATA:  Fall at home. EXAM: CT HEAD WITHOUT CONTRAST CT CERVICAL SPINE WITHOUT CONTRAST TECHNIQUE: Multidetector CT imaging of the head and cervical spine was performed following the standard protocol without intravenous contrast. Multiplanar CT image reconstructions of the cervical spine were also generated. COMPARISON:  04/19/2017, 06/05/2016 CT FINDINGS: CT HEAD FINDINGS Brain: No intracranial hemorrhage. Extra-axial left frontal hyperdense mass measuring 2.7 x 2.1 x 2.8 cm has slightly increased in size from prior exam where it measured 2.4 x 1.9 x 2.1 cm. Similar localized mass-effect on brain parenchyma. No midline shift. Age related atrophy. Chronic small vessel ischemia stable. 3 No hydrocephalus. The  basilar cisterns are patent. No evidence of territorial infarct or acute ischemia. No extra-axial or intracranial fluid collection. Vascular: Atherosclerosis of skullbase vasculature without hyperdense vessel or abnormal calcification. Skull: No fracture or focal lesion. Small frontal calcified sebaceous cyst in the midline versus small exostosis. This is unchanged. Sinuses/Orbits: Paranasal sinuses and mastoid air cells are clear. The visualized orbits are unremarkable. Other: None. CT CERVICAL SPINE FINDINGS Alignment: Normal. Skull base and vertebrae: No acute fracture. Vertebral body heights are maintained. The dens and skull base are intact. Soft tissues and spinal canal: No prevertebral fluid or swelling. No visible canal hematoma. Disc levels: Diffuse degenerative disc disease with disc space narrowing and endplate spurring. Multilevel facet hypertrophy. There is bony canal narrowing at the level of C5-C6 and to a lesser extent C6-C7. multilevel neural foraminal stenosis. Upper chest: No acute finding. Mild biapical pleuroparenchymal scarring. Other: Both shoulders included on the coronal reformats. Reverse left shoulder arthroplasty. High-riding humeral head abutting the undersurface of the acromion with associated degenerative change. IMPRESSION: 1. No acute intracranial abnormality. No skull fracture. 2. Known left frontal extra-axial hyperdense mass is most consistent with a meningioma, however has mildly increased in size since 2018 CT currently measuring 2.7 x 2.1 x 2.8 cm. 3. Multilevel degenerative change throughout the cervical spine without acute fracture or subluxation. Electronically Signed   By: Keith Rake M.D.   On: 12/20/2018 01:16   Mr Brain Wo Contrast  Result Date: 12/20/2018 CLINICAL DATA:  Focal neuro deficit with stroke suspected EXAM: MRI HEAD WITHOUT CONTRAST TECHNIQUE: Multiplanar, multiecho pulse sequences of the brain and surrounding structures were obtained without  intravenous contrast. COMPARISON:  Head CT from earlier today FINDINGS: Brain: No acute infarction, hemorrhage, hydrocephalus, extra-axial collection. Confluent FLAIR hyperintensity in the cerebral white matter attributed to chronic small vessel ischemia. Small vessel ischemic type changes are also seen in the deep gray nuclei, especially the bilateral thalamus, and to a mild extent in the pons. Remote micro hemorrhages in the deep brain and bilateral cerebellum, a hypertensive pattern. Small remote left cerebellar infarct. Known extra-axial mass along the high and lateral left frontal lobe measuring 3.1 cm. The mass has enlarged since 2018 head CT when it measured 2 cm. No adjacent brain edema Vascular: Normal arterial flow voids. Likely incidental loss of flow void in the right sigmoid and transverse sinuses, often attributed to venous impingement in the neck. Skull and upper cervical spine: No focal marrow lesion. C2-3 and C3-4 disc and facet degeneration. Probable osteoma in the central forehead. Sinuses/Orbits: Negative IMPRESSION: 1. No emergent finding. 2. Extensive chronic small vessel ischemia with remote hypertensive pattern micro hemorrhages. 3. 3.1 cm meningioma along the high left frontal convexity which has enlarged from 2 cm in 2018. No adjacent  brain edema. Electronically Signed   By: Monte Fantasia M.D.   On: 12/20/2018 08:11   Dg Chest Portable 1 View  Result Date: 12/20/2018 CLINICAL DATA:  Fall. EXAM: PORTABLE CHEST 1 VIEW COMPARISON:  04/18/2017 FINDINGS: Chronic hyperinflation. Biapical pleural parenchymal scarring. Unchanged heart size and mediastinal contours with aortic atherosclerosis. No acute airspace disease. Multiple skin folds project over the both hemi thoraces, no pneumothorax. No large pleural effusion. No pulmonary edema. Left shoulder arthroplasty. Chronic right rotator cuff arthropathy. No acute osseous abnormalities are seen. IMPRESSION: 1. No acute abnormality or change  from prior exam. 2. Stable chronic hyperinflation and biapical pleural parenchymal scarring. 3.  Aortic Atherosclerosis (ICD10-I70.0). Electronically Signed   By: Keith Rake M.D.   On: 12/20/2018 02:01    Microbiology: Recent Results (from the past 240 hour(s))  SARS CORONAVIRUS 2 (TAT 6-24 HRS) Nasopharyngeal Nasopharyngeal Swab     Status: None   Collection Time: 12/20/18  6:31 AM   Specimen: Nasopharyngeal Swab  Result Value Ref Range Status   SARS Coronavirus 2 NEGATIVE NEGATIVE Final    Comment: (NOTE) SARS-CoV-2 target nucleic acids are NOT DETECTED. The SARS-CoV-2 RNA is generally detectable in upper and lower respiratory specimens during the acute phase of infection. Negative results do not preclude SARS-CoV-2 infection, do not rule out co-infections with other pathogens, and should not be used as the sole basis for treatment or other patient management decisions. Negative results must be combined with clinical observations, patient history, and epidemiological information. The expected result is Negative. Fact Sheet for Patients: SugarRoll.be Fact Sheet for Healthcare Providers: https://www.woods-mathews.com/ This test is not yet approved or cleared by the [place] FDA and  has been authorized for detection and/or diagnosis of SARS-CoV-2 by FDA under an Emergency Use Authorization (EUA). This EUA will remain  in effect (meaning this test can be used) for the duration of the COVID-19 declaration under Section 56 4(b)(1) of the Act, 21 U.S.C. section 360bbb-3(b)(1), unless the authorization is terminated or revoked sooner. Performed at [facility], [place] [address], [place], [place] [address]   Culture, Urine     Status: Abnormal   Collection Time: 12/20/18 12:54 PM   Specimen: Urine, Random  Result Value Ref Range Status   Specimen Description URINE, RANDOM  Final   Special Requests   Final    NONE Performed at  [facility], [place] [address] [place], [place] [address]    Culture MULTIPLE SPECIES PRESENT, SUGGEST RECOLLECTION (A)  Final   Report Status 12/21/2018 FINAL  Final     Labs: Basic Metabolic Panel: Recent Labs  Lab 12/20/18 0009 12/21/18 0400 12/22/18 0542  NA 140 145 140  K 3.8 3.1* 3.8  CL 100 112* 108  CO2 22 20* 25  GLUCOSE 107* 74 105*  BUN 43* 27* 36*  CREATININE 1.50* 1.08* 0.94  CALCIUM 9.9 8.9 9.1  MG  --   --  1.9   Liver Function Tests: Recent Labs  Lab 12/20/18 0009  AST 69*  ALT 39  ALKPHOS 75  BILITOT 1.4*  PROT 7.1  ALBUMIN 3.8   No results for input(s): LIPASE, AMYLASE in the last 168 hours. No results for input(s): AMMONIA in the last 168 hours. CBC: Recent Labs  Lab 12/20/18 0009 12/21/18 0400 12/22/18 0542  WBC 12.6* 9.2 8.1  NEUTROABS 11.3*  --   --   HGB 14.1 11.9* 11.7*  HCT 43.6 33.4* 35.1*  MCV 93.4 94.6 93.9  PLT  268 223 216   Cardiac Enzymes: Recent Labs  Lab 12/20/18 0009 12/21/18 0916  CKTOTAL 870* 257*   BNP: BNP (last 3 results) No results for input(s): BNP in the last 8760 hours.  ProBNP (last 3 results) No results for input(s): PROBNP in the last 8760 hours.  CBG: Recent Labs  Lab 12/20/18 0042  GLUCAP 102*       Signed:  Kayleen Memos, MD [facility] 12/24/2018, 11:45 AM

## 2018-12-24 NOTE — Progress Notes (Signed)
[name] to be discharged to [facility] per MD order. Patient verbalized understanding.  Skin clean, dry and intact without evidence of skin break down, no evidence of skin tears noted. IV catheter discontinued intact. Site without signs and symptoms of complications. Dressing and pressure applied. Pt denies pain at the site currently. No complaints noted.  Patient free of lines, drains, and wounds.   Discharge packet assembled. An After Visit Summary (AVS) was printed and given to the EMS personnel. Patient escorted via stretcher and discharged to [facility] via ambulance. Report called to accepting facility; all questions and concerns addressed.   Shela Commons, RN

## 2018-12-24 NOTE — Discharge Instructions (Signed)
Weakness °Weakness is a lack of strength. You may feel weak all over your body (generalized), or you may feel weak in one part of your body (focal). There are many potential causes of weakness. Sometimes, the cause of your weakness may not be known. Some causes of weakness can be serious, so it is important to see your doctor. °Follow these instructions at home: °Activity °· Rest as needed. °· Try to get enough sleep. Most adults need 7-8 hours of sleep each night. Talk to your doctor about how much sleep you need each night. °· Do exercises, such as arm curls and leg raises, for 30 minutes at least 2 days a week or as told by your doctor. °· Think about working with a physical therapist or trainer to help you get stronger. °General instructions ° °· Take over-the-counter and prescription medicines only as told by your doctor. °· Eat a healthy, well-balanced diet. This includes: °? Proteins to build muscles, such as lean meats and fish. °? Fresh fruits and vegetables. °? Carbohydrates to boost energy, such as whole grains. °· Drink enough fluid to keep your pee (urine) pale yellow. °· Keep all follow-up visits as told by your doctor. This is important. °Contact a doctor if: °· Your weakness does not get better or it gets worse. °· Your weakness affects your ability to: °? Think clearly. °? Do your normal daily activities. °Get help right away if you: °· Have sudden weakness on one side of your face or body. °· Have chest pain. °· Have trouble breathing or shortness of breath. °· Have problems with your vision. °· Have trouble talking or swallowing. °· Have trouble standing or walking. °· Are light-headed. °· Pass out (lose consciousness). °Summary °· Weakness is a lack of strength. You may feel weak all over your body or just in one part of your body. °· There are many potential causes of weakness. Sometimes, the cause of your weakness may not be known. °· Rest as needed, and try to get enough sleep. Most adults  need 7-8 hours of sleep each night. °· Eat a healthy, well-balanced diet. °This information is not intended to replace advice given to you by your health care provider. Make sure you discuss any questions you have with your health care provider. °Document Released: 01/27/2008 Document Revised: 09/19/2017 Document Reviewed: 09/19/2017 °Elsevier Patient Education © 2020 [employer] ° °

## 2018-12-24 NOTE — Progress Notes (Signed)
Physical Therapy Treatment Patient Details Name: [name] MRN: [id number] DOB: 09-Dec-1930 Today's Date: 12/24/2018    History of Present Illness Pt is an 85 y/o female admitted after being found down for 2 days following fall. Pt found to be dehydrated. MRI negative for acute abnormality. CT of head and C spine negative. PMH includes HTN, COPD, arthritis, bilateral TKA and bilateral THA.     PT Comments    Patient seen for mobility progression. Pt requires min/mod A for bed mobility and min A +2 for OOB mobility using RW. Pt tolerated gait training distance of 20 ft X 2 trials with seated break. Continue to progress as tolerated with anticipated d/c to SNF for further skilled PT services.     Follow Up Recommendations  SNF;Supervision/Assistance - 24 hour     Equipment Recommendations  Other (comment)(TBD)    Recommendations for Other Services       Precautions / Restrictions Precautions Precautions: Fall    Mobility  Bed Mobility Overal bed mobility: Needs Assistance Bed Mobility: Rolling;Sidelying to Sit Rolling: Min assist Sidelying to sit: Mod assist       General bed mobility comments: cues for sequencing and use of rails; assist to elevate trunk into sitting as pt has difficulty reaching with R UE   Transfers Overall transfer level: Needs assistance Equipment used: Rolling walker (2 wheeled) Transfers: Sit to/from Stand Sit to Stand: Min assist         General transfer comment: assist to power up into standing; cues for safe hand placement and forward gaze  Ambulation/Gait Ambulation/Gait assistance: Min assist;+2 safety/equipment Gait Distance (Feet): (20 ft X 2 trials with seated rest break) Assistive device: Rolling walker (2 wheeled) Gait Pattern/deviations: Step-through pattern;Decreased stride length;Trunk flexed Gait velocity: decreased   General Gait Details: multimodal cues for upright posture and safe use of AD; assistance for balance and  guiding RW; seated break due to fatigue   Stairs             Wheelchair Mobility    Modified Rankin (Stroke Patients Only)       Balance Overall balance assessment: History of Falls;Needs assistance Sitting-balance support: Bilateral upper extremity supported;Feet supported Sitting balance-Leahy Scale: Fair     Standing balance support: Bilateral upper extremity supported;During functional activity Standing balance-Leahy Scale: Poor                              Cognition Arousal/Alertness: Awake/alert Behavior During Therapy: Flat affect Overall Cognitive Status: No family/caregiver present to determine baseline cognitive functioning                                 General Comments: pt repeating same stories several times and tangential during session; follow single step commands wiht increased time      Exercises      General Comments General comments (skin integrity, edema, etc.): SpO2 and HR WNL on RA      Pertinent Vitals/Pain Pain Assessment: Faces Faces Pain Scale: Hurts little more Pain Location: R shoulder with bed mobility Pain Descriptors / Indicators: Sore Pain Intervention(s): Limited activity within patient's tolerance;Monitored during session;Repositioned    Home Living                      Prior Function            PT Goals (  current goals can now be found in the care plan section) Acute Rehab PT Goals Patient Stated Goal: get stronger. ok with going to SNF for rehab. Progress towards PT goals: Progressing toward goals    Frequency    Min 2X/week      PT Plan Current plan remains appropriate    Co-evaluation              AM-PAC PT "6 Clicks" Mobility   Outcome Measure  Help needed turning from your back to your side while in a flat bed without using bedrails?: A Little Help needed moving from lying on your back to sitting on the side of a flat bed without using bedrails?: A Lot Help  needed moving to and from a bed to a chair (including a wheelchair)?: A Lot Help needed standing up from a chair using your arms (e.g., wheelchair or bedside chair)?: A Little Help needed to walk in hospital room?: A Little Help needed climbing 3-5 steps with a railing? : Total 6 Click Score: 14    End of Session Equipment Utilized During Treatment: Gait belt Activity Tolerance: Patient tolerated treatment well Patient left: with call bell/phone within reach;in chair;with chair alarm set Nurse Communication: Mobility status PT Visit Diagnosis: Muscle weakness (generalized) (M62.81);History of falling (Z91.81);Unsteadiness on feet (R26.81)     Time: [id number] PT Time Calculation (min) (ACUTE ONLY): 27 min  Charges:  $Gait Training: 23-37 mins                     Earney Navy, PTA Acute Rehabilitation Services Pager: [phone] Office: [phone]     Darliss Cheney 12/24/2018, 4:27 PM

## 2018-12-24 NOTE — Progress Notes (Signed)
Peri care done.. foley care documented incorrectly

## 2018-12-24 NOTE — Care Management Important Message (Signed)
Important Message  Patient Details  Name: [name] MRN: [id number] Date of Birth: Sep 08, 1930   Medicare Important Message Given:  Yes     Orbie Pyo 12/24/2018, 3:48 PM

## 2018-12-24 NOTE — TOC Transition Note (Addendum)
Transition of Care [facility]) - CM/SW Discharge Note   Patient Details  Name: [name] MRN: [id number] Date of Birth: 25-Jan-1931  Transition of Care [facility]) CM/SW Contact:  Eileen Stanford, LCSW Phone Number: 12/24/2018, 2:06 PM   Clinical Narrative:   Clinical Social Worker facilitated patient discharge including contacting patient family and facility to confirm patient discharge plans.  Clinical information faxed to facility and family agreeable with plan.  CSW arranged ambulance transport via [facility] to [facility] (room 123) .  RN to call [phone] for report prior to discharge.    Final next level of care: [place] Barriers to Discharge: No Barriers Identified   Patient Goals and CMS Choice Patient states their goals for this hospitalization and ongoing recovery are:: Go to rehab to get stronger and go home. CMS Medicare.gov Compare Post Acute Care list provided to:: Patient Choice offered to / list presented to : Patient  Discharge Placement              Patient chooses bed at: [facility]) Patient to be transferred to facility by: Sarita Name of family member notified: pt alert and oriented-aware of d/c Patient and family notified of of transfer: 12/24/18  Discharge Plan and Services In-house Referral: Clinical Social Work Discharge Planning Services: NA Post Acute Care Choice: [place]                               Social Determinants of Health (SDOH) Interventions     Readmission Risk Interventions No flowsheet data found.

## 2019-01-16 ENCOUNTER — Emergency Department (HOSPITAL_COMMUNITY): Payer: Medicare Other

## 2019-01-16 ENCOUNTER — Inpatient Hospital Stay (HOSPITAL_COMMUNITY)
Admission: EM | Admit: 2019-01-16 | Discharge: 2019-01-28 | DRG: 871 | Disposition: A | Discharge status: Skilled Nursing Facility | Payer: Medicare Other | Source: Skilled Nursing Facility | Attending: Internal Medicine | Admitting: Internal Medicine

## 2019-01-16 ENCOUNTER — Other Ambulatory Visit: Payer: Self-pay

## 2019-01-16 ENCOUNTER — Encounter (HOSPITAL_COMMUNITY): Payer: Self-pay

## 2019-01-16 DIAGNOSIS — I700 Atherosclerosis of aorta: Secondary | ICD-10-CM | POA: Diagnosis present

## 2019-01-16 DIAGNOSIS — G629 Polyneuropathy, unspecified: Secondary | ICD-10-CM | POA: Diagnosis present

## 2019-01-16 DIAGNOSIS — E042 Nontoxic multinodular goiter: Secondary | ICD-10-CM | POA: Diagnosis present

## 2019-01-16 DIAGNOSIS — I444 Left anterior fascicular block: Secondary | ICD-10-CM | POA: Diagnosis present

## 2019-01-16 DIAGNOSIS — T380X5A Adverse effect of glucocorticoids and synthetic analogues, initial encounter: Secondary | ICD-10-CM | POA: Diagnosis not present

## 2019-01-16 DIAGNOSIS — Z87891 Personal history of nicotine dependence: Secondary | ICD-10-CM

## 2019-01-16 DIAGNOSIS — A4189 Other specified sepsis: Principal | ICD-10-CM | POA: Diagnosis present

## 2019-01-16 DIAGNOSIS — Y92230 Patient room in hospital as the place of occurrence of the external cause: Secondary | ICD-10-CM | POA: Diagnosis not present

## 2019-01-16 DIAGNOSIS — Z8249 Family history of ischemic heart disease and other diseases of the circulatory system: Secondary | ICD-10-CM

## 2019-01-16 DIAGNOSIS — Z823 Family history of stroke: Secondary | ICD-10-CM

## 2019-01-16 DIAGNOSIS — E079 Disorder of thyroid, unspecified: Secondary | ICD-10-CM | POA: Diagnosis present

## 2019-01-16 DIAGNOSIS — J1282 Pneumonia due to coronavirus disease 2019: Secondary | ICD-10-CM

## 2019-01-16 DIAGNOSIS — Z885 Allergy status to narcotic agent status: Secondary | ICD-10-CM

## 2019-01-16 DIAGNOSIS — I712 Thoracic aortic aneurysm, without rupture, unspecified: Secondary | ICD-10-CM

## 2019-01-16 DIAGNOSIS — Z96643 Presence of artificial hip joint, bilateral: Secondary | ICD-10-CM | POA: Diagnosis present

## 2019-01-16 DIAGNOSIS — Z79899 Other long term (current) drug therapy: Secondary | ICD-10-CM

## 2019-01-16 DIAGNOSIS — K529 Noninfective gastroenteritis and colitis, unspecified: Secondary | ICD-10-CM | POA: Diagnosis present

## 2019-01-16 DIAGNOSIS — A419 Sepsis, unspecified organism: Secondary | ICD-10-CM

## 2019-01-16 DIAGNOSIS — E876 Hypokalemia: Secondary | ICD-10-CM | POA: Diagnosis present

## 2019-01-16 DIAGNOSIS — D649 Anemia, unspecified: Secondary | ICD-10-CM | POA: Diagnosis present

## 2019-01-16 DIAGNOSIS — Z90710 Acquired absence of both cervix and uterus: Secondary | ICD-10-CM

## 2019-01-16 DIAGNOSIS — R0602 Shortness of breath: Secondary | ICD-10-CM

## 2019-01-16 DIAGNOSIS — I10 Essential (primary) hypertension: Secondary | ICD-10-CM | POA: Diagnosis present

## 2019-01-16 DIAGNOSIS — Z9181 History of falling: Secondary | ICD-10-CM

## 2019-01-16 DIAGNOSIS — D709 Neutropenia, unspecified: Secondary | ICD-10-CM | POA: Diagnosis not present

## 2019-01-16 DIAGNOSIS — R918 Other nonspecific abnormal finding of lung field: Secondary | ICD-10-CM | POA: Diagnosis present

## 2019-01-16 DIAGNOSIS — Z882 Allergy status to sulfonamides status: Secondary | ICD-10-CM

## 2019-01-16 DIAGNOSIS — R739 Hyperglycemia, unspecified: Secondary | ICD-10-CM | POA: Diagnosis not present

## 2019-01-16 DIAGNOSIS — U071 COVID-19: Secondary | ICD-10-CM

## 2019-01-16 DIAGNOSIS — R0902 Hypoxemia: Secondary | ICD-10-CM | POA: Diagnosis not present

## 2019-01-16 DIAGNOSIS — N179 Acute kidney failure, unspecified: Secondary | ICD-10-CM | POA: Diagnosis not present

## 2019-01-16 DIAGNOSIS — I2510 Atherosclerotic heart disease of native coronary artery without angina pectoris: Secondary | ICD-10-CM | POA: Diagnosis present

## 2019-01-16 DIAGNOSIS — Z96653 Presence of artificial knee joint, bilateral: Secondary | ICD-10-CM | POA: Diagnosis present

## 2019-01-16 DIAGNOSIS — R911 Solitary pulmonary nodule: Secondary | ICD-10-CM

## 2019-01-16 DIAGNOSIS — Z9049 Acquired absence of other specified parts of digestive tract: Secondary | ICD-10-CM

## 2019-01-16 DIAGNOSIS — J9601 Acute respiratory failure with hypoxia: Secondary | ICD-10-CM

## 2019-01-16 DIAGNOSIS — J1289 Other viral pneumonia: Secondary | ICD-10-CM | POA: Diagnosis present

## 2019-01-16 DIAGNOSIS — Z888 Allergy status to other drugs, medicaments and biological substances status: Secondary | ICD-10-CM

## 2019-01-16 DIAGNOSIS — Z933 Colostomy status: Secondary | ICD-10-CM

## 2019-01-16 DIAGNOSIS — R6520 Severe sepsis without septic shock: Secondary | ICD-10-CM | POA: Diagnosis present

## 2019-01-16 DIAGNOSIS — M810 Age-related osteoporosis without current pathological fracture: Secondary | ICD-10-CM | POA: Diagnosis present

## 2019-01-16 DIAGNOSIS — Z96612 Presence of left artificial shoulder joint: Secondary | ICD-10-CM | POA: Diagnosis present

## 2019-01-16 DIAGNOSIS — J440 Chronic obstructive pulmonary disease with acute lower respiratory infection: Secondary | ICD-10-CM | POA: Diagnosis present

## 2019-01-16 DIAGNOSIS — E871 Hypo-osmolality and hyponatremia: Secondary | ICD-10-CM | POA: Diagnosis not present

## 2019-01-16 LAB — COMPREHENSIVE METABOLIC PANEL
ALT: 21 U/L (ref 0–44)
AST: 35 U/L (ref 15–41)
Albumin: 3.4 g/dL — ABNORMAL LOW (ref 3.5–5.0)
Alkaline Phosphatase: 89 U/L (ref 38–126)
Anion gap: 14 (ref 5–15)
BUN: 24 mg/dL — ABNORMAL HIGH (ref 8–23)
CO2: 24 mmol/L (ref 22–32)
Calcium: 8.6 mg/dL — ABNORMAL LOW (ref 8.9–10.3)
Chloride: 97 mmol/L — ABNORMAL LOW (ref 98–111)
Creatinine, Ser: 1.03 mg/dL — ABNORMAL HIGH (ref 0.44–1.00)
GFR calc Af Amer: 57 mL/min — ABNORMAL LOW (ref >60)
GFR calc non Af Amer: 49 mL/min — ABNORMAL LOW (ref >60)
Glucose, Bld: 139 mg/dL — ABNORMAL HIGH (ref 70–99)
Potassium: 3.3 mmol/L — ABNORMAL LOW (ref 3.5–5.1)
Sodium: 135 mmol/L (ref 135–145)
Total Bilirubin: 0.3 mg/dL (ref 0.3–1.2)
Total Protein: 7.2 g/dL (ref 6.5–8.1)

## 2019-01-16 LAB — CBC WITH DIFFERENTIAL/PLATELET
Abs Immature Granulocytes: 0.04 10*3/uL (ref 0.00–0.07)
Basophils Absolute: 0 10*3/uL (ref 0.0–0.1)
Basophils Relative: 0 %
Eosinophils Absolute: 0 10*3/uL (ref 0.0–0.5)
Eosinophils Relative: 0 %
HCT: 38.1 % (ref 36.0–46.0)
Hemoglobin: 11.9 g/dL — ABNORMAL LOW (ref 12.0–15.0)
Immature Granulocytes: 1 %
Lymphocytes Relative: 12 %
Lymphs Abs: 0.5 10*3/uL — ABNORMAL LOW (ref 0.7–4.0)
MCH: 30.3 pg (ref 26.0–34.0)
MCHC: 31.2 g/dL (ref 30.0–36.0)
MCV: 96.9 fL (ref 80.0–100.0)
Monocytes Absolute: 0.6 10*3/uL (ref 0.1–1.0)
Monocytes Relative: 13 %
Neutro Abs: 3.1 10*3/uL (ref 1.7–7.7)
Neutrophils Relative %: 74 %
Platelets: 187 10*3/uL (ref 150–400)
RBC: 3.93 MIL/uL (ref 3.87–5.11)
RDW: 13.6 % (ref 11.5–15.5)
WBC: 4.2 10*3/uL (ref 4.0–10.5)
nRBC: 0 % (ref 0.0–0.2)

## 2019-01-16 LAB — FIBRINOGEN: Fibrinogen: 582 mg/dL — ABNORMAL HIGH (ref 210–475)

## 2019-01-16 LAB — TRIGLYCERIDES: Triglycerides: 48 mg/dL (ref <150)

## 2019-01-16 LAB — LACTATE DEHYDROGENASE: LDH: 170 U/L (ref 98–192)

## 2019-01-16 LAB — FERRITIN: Ferritin: 142 ng/mL (ref 11–307)

## 2019-01-16 LAB — PROCALCITONIN: Procalcitonin: <0.1 ng/mL

## 2019-01-16 LAB — D-DIMER, QUANTITATIVE: D-Dimer, Quant: 2.09 ug/mL-FEU — ABNORMAL HIGH (ref 0.00–0.50)

## 2019-01-16 LAB — C-REACTIVE PROTEIN: CRP: 3.7 mg/dL — ABNORMAL HIGH (ref <1.0)

## 2019-01-16 LAB — LACTIC ACID, PLASMA: Lactic Acid, Venous: 2.9 mmol/L (ref 0.5–1.9)

## 2019-01-16 MED ORDER — ACETAMINOPHEN 325 MG PO TABS
650.0000 mg | ORAL_TABLET | Freq: Once | ORAL | Status: AC
  Administered 2019-01-16: 650 mg via ORAL
  Filled 2019-01-16: qty 2

## 2019-01-16 NOTE — ED Provider Notes (Signed)
Emergency Department Provider Note   I have reviewed the triage vital signs and the nursing notes.   HISTORY  Chief Complaint COVID positive   HPI [name] is a 85 y.o. female with PMH reviewed below presents to the ED from [facility] after testing positive for COVID-19.  Patient tested positive for COVID-19 today and developed fever so was sent in.  Patient denies shortness of breath or coughing to me.  She states that she was sneezing a lot earlier today but that stopped.  No vomiting or abdominal pain.  Patient has chronic diarrhea and colostomy which is unchanged.    Past Medical History:  Diagnosis Date  . Anemia   . Arthritis   . Cancer ([place])    melanoma  . Chronic diarrhea   . Complication of anesthesia    hallucinations in the past, last surgery was OK anesth.   Marland Kitchen COPD (chronic obstructive pulmonary disease) ([place])   . 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  Marland Kitchen GERD (gastroesophageal reflux disease)   . Goiter   . Headache(784.0)    sinus  . History of blood transfusion 2006   due to colon bleeding   . Hypertension   . Neuromuscular disorder ([place])    peripheral neuropathy - both feet   . Osteoporosis   . Shortness of breath dyspnea   . UTI (lower urinary tract infection)    start Tx today /w probiotic- 08/18/2014     Patient Active Problem List   Diagnosis Date Noted  . Sepsis ([place]) 01/17/2019  . Acute respiratory failure with hypoxia ([place]) 01/17/2019  . Pneumonia due to COVID-19 virus 01/17/2019  . Pulmonary nodule 01/17/2019  . Thoracic aortic aneurysm ([place]) 01/17/2019  . Fall 12/21/2018  . Weakness 12/20/2018  . Pressure injury of skin 12/20/2018  . Meningioma ([place]) 12/20/2018  . AKI (acute kidney injury) ([place]) 12/20/2018  . Fall at home, initial encounter 04/19/2017  . Bilateral hip dislocation ([place]) 04/19/2017  . Uncontrolled pain 04/19/2017  . Hypertension 04/19/2017  . Leukocytosis 04/19/2017  . Elevated  troponin 04/19/2017  . Osteoarthritis of hand 01/01/2016  . 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 Unspecified osteoarthritis, unspecified site 01/01/2016  . S/p reverse total shoulder arthroplasty 08/27/2014  . Chronic diarrhea 12/22/2011    Past Surgical History:  Procedure Laterality Date  . ABDOMINAL HYSTERECTOMY    . APPENDECTOMY    . BALLOON DILATION  04/19/2011   Procedure: BALLOON DILATION;  Surgeon: Landry Dyke, MD;  Location: [facility];  Service: Endoscopy;  Laterality: N/A;  . BLADDER SURGERY    . COLON SURGERY  2006   Dr. Zella Richer- due to diverticulitis  . ESOPHAGOGASTRODUODENOSCOPY  04/19/2011   Procedure: ESOPHAGOGASTRODUODENOSCOPY (EGD);  Surgeon: Landry Dyke, MD;  Location: [facility];  Service: Endoscopy;  Laterality: N/A;  mac   . hip replacement-bilateral    . JOINT REPLACEMENT    . REVERSE SHOULDER ARTHROPLASTY Left 08/27/2014   Procedure: REVERSE LEFT SHOULDER ARTHROPLASTY;  Surgeon: Justice Britain, MD;  Location: [place] [facility];  Service: Orthopedics;  Laterality: Left;  . TOTAL KNEE ARTHROPLASTY Bilateral     Allergies Hydromorphone, Lisinopril, Mirtazapine, Nitrofuran derivatives, Septra [bactrim], and Sulfamethoxazole-trimethoprim  Family History  Problem Relation Age of Onset  . Heart attack Mother   . Stroke Father     Social History Social History   Tobacco Use  . Smoking status: Former Smoker    Quit date: 02/28/1968  Years since quitting: 50.9  Substance Use Topics  . Alcohol use: Yes    Comment: rarely  . Drug use: No    Review of Systems  Constitutional: Positive fever.  Eyes: No visual changes. ENT: No sore throat. Positive sneezing.  Cardiovascular: Denies chest pain. Respiratory: Denies shortness of breath. Gastrointestinal: No abdominal pain.  No nausea, no vomiting.  No diarrhea.  No constipation. Genitourinary: Negative for dysuria. Musculoskeletal: Negative for back pain. Skin: Negative for rash. Neurological: Negative  for headaches, focal weakness or numbness.  10-point ROS otherwise negative.  ____________________________________________   PHYSICAL EXAM:  VITAL SIGNS: ED Triage Vitals  Enc Vitals Group     BP 01/16/19 2130 (!) 181/90     Pulse Rate 01/16/19 2130 (!) 112     Resp 01/16/19 2130 18     Temp 01/16/19 2130 (!) 102.2 F (39 C)     Temp Source 01/16/19 2130 Oral     SpO2 01/16/19 2113 95 %   Constitutional: Alert and oriented. Well appearing and in no acute distress. Eyes: Conjunctivae are normal.  Head: Atraumatic. Nose: No congestion/rhinnorhea. Mouth/Throat: Mucous membranes are moist. Neck: No stridor.   Cardiovascular: Tachycardia. Good peripheral circulation. Grossly normal heart sounds.   Respiratory: Normal respiratory effort.  No retractions. Lungs CTAB. Gastrointestinal: Soft and nontender. No distention.  Musculoskeletal: No lower extremity tenderness nor edema. No gross deformities of extremities. Neurologic:  Normal speech and language.  Skin:  Skin is warm, dry and intact. No rash noted.   ____________________________________________   LABS (all labs ordered are listed, but only abnormal results are displayed)  Labs Reviewed  LACTIC ACID, PLASMA - Abnormal; Notable for the following components:      Result Value   Lactic Acid, Venous 2.9 (*)    All other components within normal limits  CBC WITH DIFFERENTIAL/PLATELET - Abnormal; Notable for the following components:   Hemoglobin 11.9 (*)    Lymphs Abs 0.5 (*)    All other components within normal limits  COMPREHENSIVE METABOLIC PANEL - Abnormal; Notable for the following components:   Potassium 3.3 (*)    Chloride 97 (*)    Glucose, Bld 139 (*)    BUN 24 (*)    Creatinine, Ser 1.03 (*)    Calcium 8.6 (*)    Albumin 3.4 (*)    GFR calc non Af Amer 49 (*)    GFR calc Af Amer 57 (*)    All other components within normal limits  D-DIMER, QUANTITATIVE (NOT AT [facility]) - Abnormal; Notable for the following  components:   D-Dimer, Quant 2.09 (*)    All other components within normal limits  FIBRINOGEN - Abnormal; Notable for the following components:   Fibrinogen 582 (*)    All other components within normal limits  C-REACTIVE PROTEIN - Abnormal; Notable for the following components:   CRP 3.7 (*)    All other components within normal limits  BASIC METABOLIC PANEL - Abnormal; Notable for the following components:   Glucose, Bld 105 (*)    Calcium 8.6 (*)    All other components within normal limits  CBC WITH DIFFERENTIAL/PLATELET - Abnormal; Notable for the following components:   WBC 3.8 (*)    RBC 3.57 (*)    Hemoglobin 10.9 (*)    HCT 34.5 (*)    All other components within normal limits  C-REACTIVE PROTEIN - Abnormal; Notable for the following components:   CRP 3.1 (*)    All other components within  normal limits  SEDIMENTATION RATE - Abnormal; Notable for the following components:   Sed Rate 52 (*)    All other components within normal limits  FIBRINOGEN - Abnormal; Notable for the following components:   Fibrinogen 541 (*)    All other components within normal limits  D-DIMER, QUANTITATIVE (NOT AT [facility]) - Abnormal; Notable for the following components:   D-Dimer, Quant 1.74 (*)    All other components within normal limits  CULTURE, BLOOD (ROUTINE X 2)  CULTURE, BLOOD (ROUTINE X 2)  MRSA PCR SCREENING  SARS CORONAVIRUS 2 (TAT 6-24 HRS)  LACTIC ACID, PLASMA  PROCALCITONIN  LACTATE DEHYDROGENASE  FERRITIN  TRIGLYCERIDES  MAGNESIUM  LACTATE DEHYDROGENASE  MAGNESIUM  PHOSPHORUS  FERRITIN  TSH  T4, FREE  ABO/RH   ____________________________________________  EKG   EKG Interpretation  Date/Time:  Thursday January 16 2019 22:34:47 EST Ventricular Rate:  110 PR Interval:    QRS Duration: 92 QT Interval:  321 QTC Calculation: 435 R Axis:   -56 Text Interpretation: Sinus tachycardia Left anterior fascicular block Consider left ventricular hypertrophy Anterior Q  waves, possibly due to LVH No STEMI Confirmed by Nanda Quinton [phone]) on 01/16/2019 10:43:39 PM       ____________________________________________  RADIOLOGY  Ct Angio Chest Pe W Or Wo Contrast  Result Date: 01/17/2019 CLINICAL DATA:  Shortness of breath.  COVID-19 positive EXAM: CT ANGIOGRAPHY CHEST WITH CONTRAST TECHNIQUE: Multidetector CT imaging of the chest was performed using the standard protocol during bolus administration of intravenous contrast. Multiplanar CT image reconstructions and MIPs were obtained to evaluate the vascular anatomy. CONTRAST:  175m OMNIPAQUE IOHEXOL 350 MG/ML SOLN COMPARISON:  Chest radiograph January 16, 2019 FINDINGS: Cardiovascular: There is no demonstrable pulmonary embolus. The ascending thoracic aortic diameter measures 4.1 x 4.1 cm. There is no appreciable thoracic aortic dissection. There are foci of calcification in visualized great vessels. There is aortic atherosclerosis as well as multiple foci of coronary artery calcification. There is a small pericardial effusion. There is no pericardial thickening. Mediastinum/Nodes: There are multiple subcentimeter nodular lesions in the thyroid consistent with multinodular goiter. There is a solid mass arising from the lower pole of the right lobe of the thyroid measuring 3.3 x 2.5 cm. There is focal calcification in the central aspect of this mass. There is no evident thoracic adenopathy. No esophageal lesions are evident. Lungs/Pleura: There is scarring in the apex regions. There is ground-glass type opacity in the medial segment right middle lobe. Similar ground-glass type opacity is noted in the lateral and posterior segments of the left lower lobe. A small focus of probable developing ground-glass opacity is noted in the posterior segment right lower lobe. There is bibasilar atelectasis. There are no appreciable pleural effusions. There is mild bilateral lower lobe bronchiectasis. On axial slice 78 series 10, there  is a 7 x 7 mm nodular opacity in the medial segment right middle lobe. There is an immediately adjacent 4 mm nodular opacity in this area seen on axial slice 77 series 10. Upper Abdomen: There is upper abdominal aortic atherosclerosis. Visualized upper abdominal structures otherwise appear unremarkable. Musculoskeletal: There is degenerative change in the thoracic spine. There are no blastic or lytic bone lesions. There is a total shoulder replacement on the left. There are no chest wall lesions evident. Review of the MIP images confirms the above findings. IMPRESSION: 1.  No demonstrable pulmonary embolus. 2. Ascending thoracic aorta measures 4.1 x 4.1 cm. No dissection evident. There is aortic atherosclerosis as well  as foci of great vessel and coronary artery calcification. Recommend annual imaging followup by CTA or MRA. This recommendation follows 2010 ACCF/AHA/AATS/ACR/ASA/SCA/SCAI/SIR/STS/SVM Guidelines for the Diagnosis and Management of Patients with Thoracic Aortic Disease. Circulation. 2010; 121: S063-K160. Aortic aneurysm NOS (ICD10-I71.9). 3. Multifocal pneumonia with the greatest degree of airspace opacity in the right middle lobe and left lower lobe regions. There are scattered areas of atelectasis as well. 4. Nodular opacities in the right middle lobe, largest measuring 7 mm. Non-contrast chest CT at 6-12 months is recommended. If the nodule is stable at time of repeat CT, then future CT at 18-24 months (from today's scan) is considered optional for low-risk patients, but is recommended for high-risk patients. This recommendation follows the consensus statement: Guidelines for Management of Incidental Pulmonary Nodules Detected on CT Images: From the Fleischner Society 2017; Radiology 2017; 284:228-243. 5. Dominant mass arising from right lobe of thyroid measuring 3.3 x 2.5 cm. Advise correlation with thyroid ultrasound given this finding. 6.  Mild lower lobe bronchiectatic change. 7.  No evident  adenopathy. Aortic Atherosclerosis (ICD10-I70.0). Electronically Signed   By: Lowella Grip III M.D.   On: 01/17/2019 08:49   Dg Chest Portable 1 View  Result Date: 01/16/2019 CLINICAL DATA:  COVID positive EXAM: PORTABLE CHEST 1 VIEW COMPARISON:  Chest radiograph dated 12/20/2018 FINDINGS: The heart size is normal. Vascular calcifications are seen in the aortic arch. The lungs are hyperinflated. Mild biapical pleuroparenchymal scarring is unchanged. The patient is status post a reverse left shoulder arthroplasty. IMPRESSION: Hyperinflation likely reflecting chronic obstructive pulmonary disease. No focal consolidation. Electronically Signed   By: Zerita Boers M.D.   On: 01/16/2019 22:14    ____________________________________________   PROCEDURES  Procedure(s) performed:   Procedures  CRITICAL CARE Performed by: Margette Fast Total critical care time: 35 minutes Critical care time was exclusive of separately billable procedures and treating other patients. Critical care was necessary to treat or prevent imminent or life-threatening deterioration. Critical care was time spent personally by me on the following activities: development of treatment plan with patient and/or surrogate as well as nursing, discussions with consultants, evaluation of patient's response to treatment, examination of patient, obtaining history from patient or surrogate, ordering and performing treatments and interventions, ordering and review of laboratory studies, ordering and review of radiographic studies, pulse oximetry and re-evaluation of patient's condition.  Nanda Quinton, MD Emergency Medicine  ____________________________________________   INITIAL IMPRESSION / ASSESSMENT AND PLAN / ED COURSE  Pertinent labs & imaging results that were available during my care of the patient were reviewed by me and considered in my medical decision making (see chart for details).   Patient from [facility] with  Covid positive test today.  She denies symptoms but is febrile here with mild tachypnea.  Oxygen intermittently dropping into mid to high 80s and then returning to normal. Patient placed on 2L [place].  Preadmit Covid labs sent with inflammatory markers being elevated.  Patient reports improved symptoms on oxygen.  Chest x-ray without infiltrate.  Plan for [facility] admit given the patient's age and oxygen requirement here.   Labs reviewed and COVID inflammatory markers are elevated. Spoke with Yvone Neu, patient's son, by phone for update. Agrees with plan of care and will continue to f/u by phone with primary team.   Discussed patient's case with [facility] to request admission. Patient and family (if present) updated with plan. Care transferred to [facility] service.  I reviewed all nursing notes, vitals, pertinent old records, EKGs, labs, imaging (  as available).  ____________________________________________  FINAL CLINICAL IMPRESSION(S) / ED DIAGNOSES  Final diagnoses:  COVID-19  Hypoxemia  Thyroid mass     MEDICATIONS GIVEN DURING THIS VISIT:  Medications  acetaminophen (TYLENOL) tablet 650 mg (has no administration in time range)  enoxaparin (LOVENOX) injection 40 mg (40 mg Subcutaneous Given 01/17/19 0514)  dexamethasone (DECADRON) injection 6 mg (6 mg Intravenous Given 01/17/19 0535)  guaiFENesin-dextromethorphan (ROBITUSSIN DM) 100-10 MG/5ML syrup 10 mL (has no administration in time range)  vitamin C (ASCORBIC ACID) tablet 500 mg (500 mg Oral Given 01/17/19 0935)  zinc sulfate capsule 220 mg (220 mg Oral Given 01/17/19 0935)  remdesivir 200 mg in sodium chloride 0.9 % 250 mL IVPB (200 mg Intravenous New Bag/Given 01/17/19 0640)    Followed by  remdesivir 100 mg in sodium chloride 0.9 % 250 mL IVPB (has no administration in time range)  potassium chloride SA (KLOR-CON) CR tablet 40 mEq (40 mEq Oral Given 01/17/19 0935)  albuterol (VENTOLIN HFA) 108 (90 Base) MCG/ACT inhaler 2-4 puff (has no  administration in time range)  acetaminophen (TYLENOL) tablet 650 mg (650 mg Oral Given 01/16/19 2234)  potassium chloride SA (KLOR-CON) CR tablet 40 mEq (40 mEq Oral Given 01/17/19 0514)  sodium chloride (PF) 0.9 % injection (10 mLs  Given 01/17/19 0935)  iohexol (OMNIPAQUE) 350 MG/ML injection 100 mL (100 mLs Intravenous Contrast Given 01/17/19 0815)    Note:  This document was prepared using Dragon voice recognition software and may include unintentional dictation errors.  Nanda Quinton, MD, [facility] Emergency Medicine    Long, [facility], MD 01/17/19 1110

## 2019-01-16 NOTE — ED Triage Notes (Addendum)
Arrived by [facility] from [facility] Patient became resident there today and tested positive for COVID. Patient was sent here by her son to be checked out. Denies SHOB, denies cough. Patient had chronic diarrhea due to partial coloectomy

## 2019-01-16 NOTE — ED Notes (Signed)
Date and time results received: 01/16/19 10:57 PM (use smartphrase ".now" to insert current time)  Test: Lactic Acid Critical Value: 2.9  Name of Provider Notified: Dr Laverta [place]  Orders Received? Or Actions Taken?:

## 2019-01-17 ENCOUNTER — Encounter (HOSPITAL_COMMUNITY): Payer: Self-pay | Admitting: Oncology

## 2019-01-17 ENCOUNTER — Inpatient Hospital Stay (HOSPITAL_COMMUNITY): Payer: Medicare Other

## 2019-01-17 ENCOUNTER — Other Ambulatory Visit: Payer: Self-pay

## 2019-01-17 DIAGNOSIS — I444 Left anterior fascicular block: Secondary | ICD-10-CM | POA: Diagnosis present

## 2019-01-17 DIAGNOSIS — E876 Hypokalemia: Secondary | ICD-10-CM | POA: Diagnosis present

## 2019-01-17 DIAGNOSIS — R911 Solitary pulmonary nodule: Secondary | ICD-10-CM | POA: Diagnosis not present

## 2019-01-17 DIAGNOSIS — I700 Atherosclerosis of aorta: Secondary | ICD-10-CM | POA: Diagnosis present

## 2019-01-17 DIAGNOSIS — R0902 Hypoxemia: Secondary | ICD-10-CM | POA: Diagnosis present

## 2019-01-17 DIAGNOSIS — J440 Chronic obstructive pulmonary disease with acute lower respiratory infection: Secondary | ICD-10-CM | POA: Diagnosis present

## 2019-01-17 DIAGNOSIS — T380X5A Adverse effect of glucocorticoids and synthetic analogues, initial encounter: Secondary | ICD-10-CM | POA: Diagnosis not present

## 2019-01-17 DIAGNOSIS — I712 Thoracic aortic aneurysm, without rupture, unspecified: Secondary | ICD-10-CM

## 2019-01-17 DIAGNOSIS — A419 Sepsis, unspecified organism: Secondary | ICD-10-CM

## 2019-01-17 DIAGNOSIS — G629 Polyneuropathy, unspecified: Secondary | ICD-10-CM | POA: Diagnosis present

## 2019-01-17 DIAGNOSIS — J1282 Pneumonia due to coronavirus disease 2019: Secondary | ICD-10-CM

## 2019-01-17 DIAGNOSIS — E079 Disorder of thyroid, unspecified: Secondary | ICD-10-CM | POA: Diagnosis present

## 2019-01-17 DIAGNOSIS — E042 Nontoxic multinodular goiter: Secondary | ICD-10-CM | POA: Diagnosis present

## 2019-01-17 DIAGNOSIS — U071 COVID-19: Secondary | ICD-10-CM

## 2019-01-17 DIAGNOSIS — N179 Acute kidney failure, unspecified: Secondary | ICD-10-CM | POA: Diagnosis not present

## 2019-01-17 DIAGNOSIS — A4189 Other specified sepsis: Secondary | ICD-10-CM | POA: Diagnosis present

## 2019-01-17 DIAGNOSIS — J9601 Acute respiratory failure with hypoxia: Secondary | ICD-10-CM

## 2019-01-17 DIAGNOSIS — M810 Age-related osteoporosis without current pathological fracture: Secondary | ICD-10-CM | POA: Diagnosis present

## 2019-01-17 DIAGNOSIS — E871 Hypo-osmolality and hyponatremia: Secondary | ICD-10-CM | POA: Diagnosis not present

## 2019-01-17 DIAGNOSIS — K529 Noninfective gastroenteritis and colitis, unspecified: Secondary | ICD-10-CM | POA: Diagnosis present

## 2019-01-17 DIAGNOSIS — I10 Essential (primary) hypertension: Secondary | ICD-10-CM | POA: Diagnosis present

## 2019-01-17 DIAGNOSIS — J1289 Other viral pneumonia: Secondary | ICD-10-CM

## 2019-01-17 DIAGNOSIS — I2510 Atherosclerotic heart disease of native coronary artery without angina pectoris: Secondary | ICD-10-CM | POA: Diagnosis present

## 2019-01-17 DIAGNOSIS — D709 Neutropenia, unspecified: Secondary | ICD-10-CM | POA: Diagnosis not present

## 2019-01-17 DIAGNOSIS — Y92230 Patient room in hospital as the place of occurrence of the external cause: Secondary | ICD-10-CM | POA: Diagnosis not present

## 2019-01-17 DIAGNOSIS — R739 Hyperglycemia, unspecified: Secondary | ICD-10-CM | POA: Diagnosis not present

## 2019-01-17 DIAGNOSIS — D649 Anemia, unspecified: Secondary | ICD-10-CM | POA: Diagnosis present

## 2019-01-17 DIAGNOSIS — R6520 Severe sepsis without septic shock: Secondary | ICD-10-CM | POA: Diagnosis present

## 2019-01-17 LAB — CBC WITH DIFFERENTIAL/PLATELET
Abs Immature Granulocytes: 0.03 10*3/uL (ref 0.00–0.07)
Basophils Absolute: 0 10*3/uL (ref 0.0–0.1)
Basophils Relative: 1 %
Eosinophils Absolute: 0 10*3/uL (ref 0.0–0.5)
Eosinophils Relative: 0 %
HCT: 34.5 % — ABNORMAL LOW (ref 36.0–46.0)
Hemoglobin: 10.9 g/dL — ABNORMAL LOW (ref 12.0–15.0)
Immature Granulocytes: 1 %
Lymphocytes Relative: 19 %
Lymphs Abs: 0.7 10*3/uL (ref 0.7–4.0)
MCH: 30.5 pg (ref 26.0–34.0)
MCHC: 31.6 g/dL (ref 30.0–36.0)
MCV: 96.6 fL (ref 80.0–100.0)
Monocytes Absolute: 0.7 10*3/uL (ref 0.1–1.0)
Monocytes Relative: 18 %
Neutro Abs: 2.3 10*3/uL (ref 1.7–7.7)
Neutrophils Relative %: 61 %
Platelets: 177 10*3/uL (ref 150–400)
RBC: 3.57 MIL/uL — ABNORMAL LOW (ref 3.87–5.11)
RDW: 13.6 % (ref 11.5–15.5)
WBC: 3.8 10*3/uL — ABNORMAL LOW (ref 4.0–10.5)
nRBC: 0 % (ref 0.0–0.2)

## 2019-01-17 LAB — BASIC METABOLIC PANEL
Anion gap: 11 (ref 5–15)
BUN: 21 mg/dL (ref 8–23)
CO2: 26 mmol/L (ref 22–32)
Calcium: 8.6 mg/dL — ABNORMAL LOW (ref 8.9–10.3)
Chloride: 99 mmol/L (ref 98–111)
Creatinine, Ser: 0.83 mg/dL (ref 0.44–1.00)
GFR calc Af Amer: >60 mL/min (ref >60)
GFR calc non Af Amer: >60 mL/min (ref >60)
Glucose, Bld: 105 mg/dL — ABNORMAL HIGH (ref 70–99)
Potassium: 3.5 mmol/L (ref 3.5–5.1)
Sodium: 136 mmol/L (ref 135–145)

## 2019-01-17 LAB — SARS CORONAVIRUS 2 (TAT 6-24 HRS): SARS Coronavirus 2: POSITIVE — AB

## 2019-01-17 LAB — D-DIMER, QUANTITATIVE: D-Dimer, Quant: 1.74 ug/mL-FEU — ABNORMAL HIGH (ref 0.00–0.50)

## 2019-01-17 LAB — ABO/RH: ABO/RH(D): O POS

## 2019-01-17 LAB — LACTATE DEHYDROGENASE: LDH: 143 U/L (ref 98–192)

## 2019-01-17 LAB — C-REACTIVE PROTEIN: CRP: 3.1 mg/dL — ABNORMAL HIGH (ref <1.0)

## 2019-01-17 LAB — FERRITIN: Ferritin: 146 ng/mL (ref 11–307)

## 2019-01-17 LAB — MAGNESIUM
Magnesium: 2 mg/dL (ref 1.7–2.4)
Magnesium: 1.9 mg/dL (ref 1.7–2.4)

## 2019-01-17 LAB — PHOSPHORUS: Phosphorus: 2.5 mg/dL (ref 2.5–4.6)

## 2019-01-17 LAB — MRSA PCR SCREENING: MRSA by PCR: NEGATIVE

## 2019-01-17 LAB — T4, FREE: Free T4: 1.02 ng/dL (ref 0.61–1.12)

## 2019-01-17 LAB — TSH: TSH: 1.417 u[IU]/mL (ref 0.350–4.500)

## 2019-01-17 LAB — SEDIMENTATION RATE: Sed Rate: 52 mm/hr — ABNORMAL HIGH (ref 0–22)

## 2019-01-17 LAB — FIBRINOGEN: Fibrinogen: 541 mg/dL — ABNORMAL HIGH (ref 210–475)

## 2019-01-17 LAB — LACTIC ACID, PLASMA: Lactic Acid, Venous: 0.7 mmol/L (ref 0.5–1.9)

## 2019-01-17 MED ORDER — GUAIFENESIN-DM 100-10 MG/5ML PO SYRP
10.0000 mL | ORAL_SOLUTION | ORAL | Status: DC | PRN
  Administered 2019-01-17 – 2019-01-20 (×2): 10 mL via ORAL
  Filled 2019-01-17 (×2): qty 10

## 2019-01-17 MED ORDER — MIRABEGRON ER 25 MG PO TB24
50.0000 mg | ORAL_TABLET | Freq: Every day | ORAL | Status: DC
  Administered 2019-01-17 – 2019-01-26 (×10): 50 mg via ORAL
  Administered 2019-01-27: 25 mg via ORAL
  Filled 2019-01-17 (×14): qty 2

## 2019-01-17 MED ORDER — SODIUM CHLORIDE 0.9 % IV SOLN
200.0000 mg | Freq: Once | INTRAVENOUS | Status: AC
  Administered 2019-01-17: 200 mg via INTRAVENOUS
  Filled 2019-01-17: qty 40

## 2019-01-17 MED ORDER — ENOXAPARIN SODIUM 40 MG/0.4ML ~~LOC~~ SOLN
40.0000 mg | Freq: Every day | SUBCUTANEOUS | Status: DC
  Administered 2019-01-17 – 2019-01-28 (×12): 40 mg via SUBCUTANEOUS
  Filled 2019-01-17 (×13): qty 0.4

## 2019-01-17 MED ORDER — SODIUM CHLORIDE 0.9 % IV SOLN
100.0000 mg | INTRAVENOUS | Status: AC
  Administered 2019-01-18 – 2019-01-21 (×4): 100 mg via INTRAVENOUS
  Filled 2019-01-17 (×4): qty 20

## 2019-01-17 MED ORDER — ACETAMINOPHEN 325 MG PO TABS
650.0000 mg | ORAL_TABLET | Freq: Four times a day (QID) | ORAL | Status: DC | PRN
  Administered 2019-01-17: 650 mg via ORAL
  Filled 2019-01-17: qty 2

## 2019-01-17 MED ORDER — POTASSIUM CHLORIDE CRYS ER 20 MEQ PO TBCR
40.0000 meq | EXTENDED_RELEASE_TABLET | Freq: Two times a day (BID) | ORAL | Status: AC
  Administered 2019-01-17 (×2): 40 meq via ORAL
  Filled 2019-01-17 (×2): qty 2

## 2019-01-17 MED ORDER — ZINC SULFATE 220 (50 ZN) MG PO CAPS
220.0000 mg | ORAL_CAPSULE | Freq: Every day | ORAL | Status: DC
  Administered 2019-01-17 – 2019-01-28 (×12): 220 mg via ORAL
  Filled 2019-01-17 (×12): qty 1

## 2019-01-17 MED ORDER — IPRATROPIUM-ALBUTEROL 20-100 MCG/ACT IN AERS
1.0000 | INHALATION_SPRAY | Freq: Four times a day (QID) | RESPIRATORY_TRACT | Status: DC
  Administered 2019-01-17 – 2019-01-18 (×6): 1 via RESPIRATORY_TRACT
  Filled 2019-01-17: qty 4

## 2019-01-17 MED ORDER — DEXAMETHASONE SODIUM PHOSPHATE 10 MG/ML IJ SOLN
6.0000 mg | Freq: Every day | INTRAMUSCULAR | Status: DC
  Administered 2019-01-17 – 2019-01-23 (×7): 6 mg via INTRAVENOUS
  Filled 2019-01-17 (×7): qty 1

## 2019-01-17 MED ORDER — DIPHENOXYLATE-ATROPINE 2.5-0.025 MG PO TABS
1.0000 | ORAL_TABLET | Freq: Three times a day (TID) | ORAL | Status: DC
  Administered 2019-01-17 – 2019-01-21 (×12): 1 via ORAL
  Filled 2019-01-17 (×10): qty 1

## 2019-01-17 MED ORDER — IOHEXOL 350 MG/ML SOLN
100.0000 mL | Freq: Once | INTRAVENOUS | Status: AC | PRN
  Administered 2019-01-17: 100 mL via INTRAVENOUS

## 2019-01-17 MED ORDER — ENSURE MAX PROTEIN PO LIQD
11.0000 [oz_av] | Freq: Three times a day (TID) | ORAL | Status: DC
  Administered 2019-01-17 – 2019-01-28 (×21): 11 [oz_av] via ORAL
  Filled 2019-01-17 (×35): qty 330

## 2019-01-17 MED ORDER — POTASSIUM CHLORIDE CRYS ER 20 MEQ PO TBCR
40.0000 meq | EXTENDED_RELEASE_TABLET | Freq: Once | ORAL | Status: AC
  Administered 2019-01-17: 40 meq via ORAL
  Filled 2019-01-17: qty 2

## 2019-01-17 MED ORDER — VITAMIN C 500 MG PO TABS
500.0000 mg | ORAL_TABLET | Freq: Every day | ORAL | Status: DC
  Administered 2019-01-17 – 2019-01-28 (×12): 500 mg via ORAL
  Filled 2019-01-17 (×12): qty 1

## 2019-01-17 MED ORDER — ADULT MULTIVITAMIN W/MINERALS CH
1.0000 | ORAL_TABLET | Freq: Every day | ORAL | Status: DC
  Administered 2019-01-17 – 2019-01-28 (×12): 1 via ORAL
  Filled 2019-01-17 (×12): qty 1

## 2019-01-17 MED ORDER — ALBUTEROL SULFATE HFA 108 (90 BASE) MCG/ACT IN AERS
2.0000 | INHALATION_SPRAY | RESPIRATORY_TRACT | Status: DC | PRN
  Filled 2019-01-17: qty 6.7

## 2019-01-17 MED ORDER — GLYCOPYRROLATE 1 MG PO TABS
1.0000 mg | ORAL_TABLET | Freq: Three times a day (TID) | ORAL | Status: DC
  Administered 2019-01-17 – 2019-01-21 (×12): 1 mg via ORAL
  Filled 2019-01-17 (×14): qty 1

## 2019-01-17 MED ORDER — SODIUM CHLORIDE (PF) 0.9 % IJ SOLN
INTRAMUSCULAR | Status: AC
  Administered 2019-01-17: 10 mL
  Filled 2019-01-17: qty 50

## 2019-01-17 NOTE — ED Notes (Signed)
Spoke with Scientist, forensic at [facility] [facility]. He informed me that it would be around 7 AM before he would have access to [place] results to fax to this facility

## 2019-01-17 NOTE — H&P (Signed)
History and Physical    [name]:[id number] DOB: Nov 19, 1930 DOA: 01/16/2019  PCP: Lajean Manes, MD Patient coming from: [facility]  Chief Complaint: COVID-19 positive  HPI: [name] is a 85 y.o. female with medical history significant of anemia, arthritis, colostomy, chronic diarrhea, COPD, GERD, hypertension presenting to the hospital via EMS from [facility].  Patient became a resident there today and tested positive for COVID-19.  She was sent here by her son to be checked out.  Patient's only complaint is sneezing.  She is not endorsing any other symptoms.  Denies cough, shortness of breath, fevers, chills, body aches, fatigue, nausea, vomiting, abdominal pain, diarrhea, or dysuria.  No additional history could be obtained from her.  ED Course: Oxygen saturation intermittently dropping to the mid to high 80s.  Patient placed on 2 L supplemental oxygen.  Temperature 102.2 F.  Slightly tachycardic and tachypneic.  No leukocytosis, does have lymphopenia.  Lactic acid 2.9.  Procalcitonin <0.10. Potassium 3.3.  LFTs normal.  Blood culture x2 pending.  Inflammatory markers elevated: D-dimer 2.0, fibrinogen 582, CRP 3.7.  Chest x-ray showing no focal consolidation.  Review of Systems:  All systems reviewed and apart from history of presenting illness, are negative.  Past Medical History:  Diagnosis Date  . Anemia   . Arthritis   . Cancer ([place])    melanoma  . Chronic diarrhea   . Complication of anesthesia    hallucinations in the past, last surgery was OK anesth.   Marland Kitchen COPD (chronic obstructive pulmonary disease) ([place])   . 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  Marland Kitchen GERD (gastroesophageal reflux disease)   . Goiter   . Headache(784.0)    sinus  . History of blood transfusion 2006   due to colon bleeding   . Hypertension   . Neuromuscular disorder ([place])    peripheral neuropathy - both feet   . Osteoporosis   . Shortness of breath dyspnea   .  UTI (lower urinary tract infection)    start Tx today /w probiotic- 08/18/2014     Past Surgical History:  Procedure Laterality Date  . ABDOMINAL HYSTERECTOMY    . APPENDECTOMY    . BALLOON DILATION  04/19/2011   Procedure: BALLOON DILATION;  Surgeon: Landry Dyke, MD;  Location: [facility];  Service: Endoscopy;  Laterality: N/A;  . BLADDER SURGERY    . COLON SURGERY  2006   Dr. Zella Richer- due to diverticulitis  . ESOPHAGOGASTRODUODENOSCOPY  04/19/2011   Procedure: ESOPHAGOGASTRODUODENOSCOPY (EGD);  Surgeon: Landry Dyke, MD;  Location: [facility];  Service: Endoscopy;  Laterality: N/A;  mac   . hip replacement-bilateral    . JOINT REPLACEMENT    . REVERSE SHOULDER ARTHROPLASTY Left 08/27/2014   Procedure: REVERSE LEFT SHOULDER ARTHROPLASTY;  Surgeon: Justice Britain, MD;  Location: [facility];  Service: Orthopedics;  Laterality: Left;  . TOTAL KNEE ARTHROPLASTY Bilateral      reports that she quit smoking about 50 years ago. She does not have any smokeless tobacco history on file. She reports current alcohol use. She reports that she does not use drugs.  Allergies  Allergen Reactions  . Hydromorphone     Pt does not remember  . Lisinopril     fatigue  . Mirtazapine     Pt doesn't remember.    . Nitrofuran Derivatives Diarrhea  . Septra [Bactrim]     Pt doesn't remember.   . Sulfamethoxazole-Trimethoprim  Family History  Problem Relation Age of Onset  . Heart attack Mother   . Stroke Father     Prior to Admission medications   Medication Sig Start Date End Date Taking? Authorizing Provider  amLODipine (NORVASC) 10 MG tablet Take 1 tablet (10 mg total) by mouth daily. 12/25/18   Kayleen Memos, DO  diphenoxylate-atropine (LOMOTIL) 2.5-0.025 MG tablet Take 1 tablet by mouth 3 (three) times daily before meals. 12/24/18   Kayleen Memos, DO  glycopyrrolate (ROBINUL) 1 MG tablet Take 1 tablet (1 mg total) by mouth 3 (three) times daily before meals. 12/24/18   Kayleen Memos, DO  Multiple Vitamin (MULTIVITAMIN WITH MINERALS) TABS tablet Take 1 tablet by mouth daily. 12/25/18   Irene Pap N, DO  MYRBETRIQ 50 MG TB24 tablet Take 50 mg by mouth daily. 01/13/19   [provider]  nutrition supplement, JUVEN, (JUVEN) PACK Take 1 packet by mouth 2 (two) times daily between meals. 12/24/18   Kayleen Memos, DO    Physical Exam: Vitals:   01/17/19 0130 01/17/19 0352 01/17/19 0411 01/17/19 0644  BP: 134/75 (!) 164/85 (!) 160/90   Pulse: 84 86 88   Resp: (!) _0 Temp:  99 F (37.2 C) 98.7 F (37.1 C)   TempSrc:  Oral Oral   SpO2: 95% 97% 98%   Weight:    51.3 kg  Height:    _1  (1.676 m)    Physical Exam  Constitutional: She is oriented to person, place, and time. No distress.  HENT:  Head: Normocephalic.  Eyes: Right eye exhibits no discharge. Left eye exhibits no discharge.  Neck: Neck supple.  Cardiovascular: Normal rate, regular rhythm and intact distal pulses.  Pulmonary/Chest: Effort normal. No respiratory distress. She has no wheezes.  Rales appreciated at the right lung base  Abdominal: Soft. Bowel sounds are normal. She exhibits no distension. There is no abdominal tenderness. There is no guarding.  Musculoskeletal:        General: No edema.  Neurological: She is alert and oriented to person, place, and time.  Skin: Skin is warm and dry. She is not diaphoretic.     Labs on Admission: I have personally reviewed following labs and imaging studies  CBC: Recent Labs  Lab 01/16/19 2151 01/17/19 0451  WBC 4.2 3.8*  NEUTROABS 3.1 2.3  HGB 11.9* 10.9*  HCT 38.1 34.5*  MCV 96.9 96.6  PLT 187 371   Basic Metabolic Panel: Recent Labs  Lab 01/16/19 2151 01/17/19 0248 01/17/19 0451  NA 135  --  136  K 3.3*  --  3.5  CL 97*  --  99  CO2 24  --  26  GLUCOSE 139*  --  105*  BUN 24*  --  21  CREATININE 1.03*  --  0.83  CALCIUM 8.6*  --  8.6*  MG  --  2.0  --    GFR: Estimated Creatinine Clearance: 39.4  mL/min (by C-G formula based on SCr of 0.83 mg/dL). Liver Function Tests: Recent Labs  Lab 01/16/19 2151  AST 35  ALT 21  ALKPHOS 89  BILITOT 0.3  PROT 7.2  ALBUMIN 3.4*   No results for input(s): LIPASE, AMYLASE in the last 168 hours. No results for input(s): AMMONIA in the last 168 hours. Coagulation Profile: No results for input(s): INR, PROTIME in the last 168 hours. Cardiac Enzymes: No results for input(s): CKTOTAL, CKMB, CKMBINDEX, TROPONINI in the last 168 hours. BNP (  last 3 results) No results for input(s): PROBNP in the last 8760 hours. HbA1C: No results for input(s): HGBA1C in the last 72 hours. CBG: No results for input(s): GLUCAP in the last 168 hours. Lipid Profile: Recent Labs    01/16/19 2151  TRIG 48   Thyroid Function Tests: No results for input(s): TSH, T4TOTAL, FREET4, T3FREE, THYROIDAB in the last 72 hours. Anemia Panel: Recent Labs    01/16/19 2151  FERRITIN 142   Urine analysis:    Component Value Date/Time   COLORURINE YELLOW 12/20/2018 1254   APPEARANCEUR CLOUDY (A) 12/20/2018 1254   LABSPEC 1.015 12/20/2018 1254   PHURINE 8.0 12/20/2018 1254   GLUCOSEU 100 (A) 12/20/2018 1254   HGBUR LARGE (A) 12/20/2018 1254   BILIRUBINUR MODERATE (A) 12/20/2018 1254   KETONESUR 15 (A) 12/20/2018 1254   PROTEINUR 100 (A) 12/20/2018 1254   UROBILINOGEN 0.2 10/30/2014 1728   NITRITE POSITIVE (A) 12/20/2018 1254   LEUKOCYTESUR MODERATE (A) 12/20/2018 1254    Radiological Exams on Admission: Ct Angio Chest Pe W Or Wo Contrast  Result Date: 01/17/2019 CLINICAL DATA:  Shortness of breath.  COVID-19 positive EXAM: CT ANGIOGRAPHY CHEST WITH CONTRAST TECHNIQUE: Multidetector CT imaging of the chest was performed using the standard protocol during bolus administration of intravenous contrast. Multiplanar CT image reconstructions and MIPs were obtained to evaluate the vascular anatomy. CONTRAST:  167m OMNIPAQUE IOHEXOL 350 MG/ML SOLN COMPARISON:  Chest  radiograph January 16, 2019 FINDINGS: Cardiovascular: There is no demonstrable pulmonary embolus. The ascending thoracic aortic diameter measures 4.1 x 4.1 cm. There is no appreciable thoracic aortic dissection. There are foci of calcification in visualized great vessels. There is aortic atherosclerosis as well as multiple foci of coronary artery calcification. There is a small pericardial effusion. There is no pericardial thickening. Mediastinum/Nodes: There are multiple subcentimeter nodular lesions in the thyroid consistent with multinodular goiter. There is a solid mass arising from the lower pole of the right lobe of the thyroid measuring 3.3 x 2.5 cm. There is focal calcification in the central aspect of this mass. There is no evident thoracic adenopathy. No esophageal lesions are evident. Lungs/Pleura: There is scarring in the apex regions. There is ground-glass type opacity in the medial segment right middle lobe. Similar ground-glass type opacity is noted in the lateral and posterior segments of the left lower lobe. A small focus of probable developing ground-glass opacity is noted in the posterior segment right lower lobe. There is bibasilar atelectasis. There are no appreciable pleural effusions. There is mild bilateral lower lobe bronchiectasis. On axial slice 78 series 10, there is a 7 x 7 mm nodular opacity in the medial segment right middle lobe. There is an immediately adjacent 4 mm nodular opacity in this area seen on axial slice 77 series 10. Upper Abdomen: There is upper abdominal aortic atherosclerosis. Visualized upper abdominal structures otherwise appear unremarkable. Musculoskeletal: There is degenerative change in the thoracic spine. There are no blastic or lytic bone lesions. There is a total shoulder replacement on the left. There are no chest wall lesions evident. Review of the MIP images confirms the above findings. IMPRESSION: 1.  No demonstrable pulmonary embolus. 2. Ascending  thoracic aorta measures 4.1 x 4.1 cm. No dissection evident. There is aortic atherosclerosis as well as foci of great vessel and coronary artery calcification. Recommend annual imaging followup by CTA or MRA. This recommendation follows 2010 ACCF/AHA/AATS/ACR/ASA/SCA/SCAI/SIR/STS/SVM Guidelines for the Diagnosis and Management of Patients with Thoracic Aortic Disease. Circulation. 2010; 121:: W960-A540 Aortic  aneurysm NOS (ICD10-I71.9). 3. Multifocal pneumonia with the greatest degree of airspace opacity in the right middle lobe and left lower lobe regions. There are scattered areas of atelectasis as well. 4. Nodular opacities in the right middle lobe, largest measuring 7 mm. Non-contrast chest CT at 6-12 months is recommended. If the nodule is stable at time of repeat CT, then future CT at 18-24 months (from today's scan) is considered optional for low-risk patients, but is recommended for high-risk patients. This recommendation follows the consensus statement: Guidelines for Management of Incidental Pulmonary Nodules Detected on CT Images: From the Fleischner Society 2017; Radiology 2017; 284:228-243. 5. Dominant mass arising from right lobe of thyroid measuring 3.3 x 2.5 cm. Advise correlation with thyroid ultrasound given this finding. 6.  Mild lower lobe bronchiectatic change. 7.  No evident adenopathy. Aortic Atherosclerosis (ICD10-I70.0). Electronically Signed   By: Lowella Grip III M.D.   On: 01/17/2019 08:49   Dg Chest Portable 1 View  Result Date: 01/16/2019 CLINICAL DATA:  COVID positive EXAM: PORTABLE CHEST 1 VIEW COMPARISON:  Chest radiograph dated 12/20/2018 FINDINGS: The heart size is normal. Vascular calcifications are seen in the aortic arch. The lungs are hyperinflated. Mild biapical pleuroparenchymal scarring is unchanged. The patient is status post a reverse left shoulder arthroplasty. IMPRESSION: Hyperinflation likely reflecting chronic obstructive pulmonary disease. No focal  consolidation. Electronically Signed   By: Zerita Boers M.D.   On: 01/16/2019 22:14    EKG: Independently reviewed.  Sinus tachycardia, left anterior fascicular block, anterior Q waves.  No STEMI.  Rate increased since prior tracing.  Assessment/Plan Principal Problem:   Pneumonia due to COVID-19 virus Active Problems:   Sepsis ([place])   Acute respiratory failure with hypoxia ([facility])   Pulmonary nodule   Thoracic aortic aneurysm ([facility])   Sepsis and acute hypoxic respiratory failure secondary to COVID-19 multifocal pneumonia Tested positive for COVID-19 11/19.  Oxygen saturation intermittently dropping to the mid to high 80s.  Patient placed on 2 L supplemental oxygen.  Temperature 102.2 F.  Slightly tachycardic and tachypneic.  No leukocytosis, does have lymphopenia.  Lactic acid 2.9 >0.7.  Procalcitonin <0.10.  Inflammatory markers elevated: D-dimer 2.0, fibrinogen 582, CRP 3.7. CT angiogram negative for PE but showing evidence of multifocal pneumonia and scattered areas of atelectasis. -Decadron -Remdesivir -Vitamin C and zinc -Tylenol as needed -Incentive spirometry -Inhaler -Daily CBC with differential, CMP, LDH, D-dimer, CRP -Continuous pulse ox -Supplemental oxygen to keep oxygen saturation above 90% -Blood culture x2 pending -Maintain euvolemia to net negative volume status  Mild hypokalemia Potassium 3.3.   -Replete potassium.  Check magnesium level and replete if low.  Continue to monitor BMP.  Thyroid mass CT showing dominant mass arising from right lobe of thyroid measuring 3.3 x 2.5 cm. -Thyroid ultrasound -Check TSH, free T4 levels  Pulmonary nodules CT showing nodular opacities in the right middle lobe, largest measuring 7 mm.  Patient is a former smoker. -Repeat chest CT in 6 months  Thoracic aortic aneurysm CT showing ascending thoracic aorta measures 4.1 x 4.1 cm. No dissection evident. There is aortic atherosclerosis as well as foci of great vessel and  coronary artery calcification.  -Patient will need annual imaging follow-up by CTA or MRI  Pharmacy med rec pending.  DVT prophylaxis: Lovenox Code Status: Full code Family Communication: No family available. Disposition Plan: Anticipate discharge after clinical improvement. Consults called: None Admission status: It is my clinical opinion that admission to INPATIENT is reasonable and necessary in this 85 y.o.  female . presenting with sepsis and acute hypoxic respiratory failure secondary to COVID-19 multifocal pneumonia.  High risk of decompensation.  Given the aforementioned, the predictability of an adverse outcome is felt to be significant. I expect that the patient will require at least 2 midnights in the hospital to treat this condition.   The medical decision making on this patient was of high complexity and the patient is at high risk for clinical deterioration, therefore this is a level 3 visit.  Shela Leff MD [facility] Pager [phone]  If 7PM-7AM, please contact night-coverage [email or web address] Password [email or web address]  01/17/2019, 9:30 AM

## 2019-01-17 NOTE — Progress Notes (Signed)
Care started prior to midnight in the emergency room and patient was admitted early this morning after midnight and I am in current agreement with assessment and plan done by Dr. Shela Leff.  Additional changes to the plan of care have been made accordingly.  Essentially the patient is an 85 year old Caucasian female from [place] facility with a past medical history significant for but not limited to anemia, arthritis, history of colostomy and chronic diarrhea, COPD, GERD, hypertension and other comorbidities who presented to the hospital after being tested positive for COVID-19 disease.  Patient became rigid and there yesterday and tested positive for COVID-19.  She was sent by her son to be checked out and her only complaint was sneezing.  In the ED she is found to be febrile with oxygen saturation intermittently dropping to the mid to high 80s and she was placed on supplemental oxygen via nasal cannula 2 L with she is off of it now.  Initially she was not neutropenic but repeat CBC this morning showed neutropenia and also showed a hemoglobin of 10.9.  She is admitted and being treated for the following but not limited to:  Acute Hypoxic Respiratory Failure secondary to COVID-19 multifocal pneumonia and SIRS from COVID-19 in a patient with underlying COPD -Tested positive for COVID-19 11/19.   -Oxygen saturation intermittently dropping to the mid to high 80s.  Patient placed on 2 L supplemental oxygen.   -Temperature 102.2 F.  Slightly tachycardic and tachypneic.  No leukocytosis, does have lymphopenia.   -Lactic acid 2.9 >0.7.  Procalcitonin <0.10.   -Inflammatory markers elevated: D-dimer 2.0, fibrinogen 582, CRP 3.7. CT angiogram negative for PE but showing evidence of multifocal pneumonia and scattered areas of atelectasis. -Decadron 6 mg Daily  -Remdesivir -Vitamin C and zinc -Tylenol as needed -Incentive spirometry -Inhaler Treatments  -Daily Inflammatory Markers; her CRP was  elevated on admission at 3.7 and trended down to 3.1, LDH was 170 and trended down to 143; D-dimer 1 from 2.09 is now 1.74 and fibrinogen were from 582 is now 541; ESR 52 -Lactic acid level was elevated slightly at 2.9 is now 0.7 Procalcitonin level was less than 0.10 -Continuous pulse ox -Supplemental oxygen to keep oxygen saturation above 90% -Blood culture x2 pending -Maintain euvolemia to net negative volume status  Hypokalemia -Potassium was 3.3. Now is 3.6  -Replete potassium.  Check magnesium level and replete if low. -Continue to monitor and trend and repeat CMP in a.m.  Thyroid Mass/Multinodular Goiter -CT showing dominant mass arising from right lobe of thyroid measuring 3.3 x 2.5 cm. -Thyroid ultrasound was ordered but will cancel have the patient do this in outpatient setting -Check TSH and was 1.417, free T4 level was 1.02  Pulmonary Nodules -CT showing nodular opacities in the right middle lobe, largest measuring 7 mm.  Patient is a former smoker. -Repeat chest CT in 6 months  Thoracic aortic aneurysm -CT showing ascending thoracic aorta measures 4.1 x 4.1 cm. No dissection evident. There is aortic atherosclerosis as well as foci of great vessel and coronary artery calcification.  -Patient will need annual imaging follow-up by CTA or MRI  Chronic Diarrhea -Continue with home Lomotil  Normocytic Anemia Patient's hemoglobin/hematocrit went from 11.9/38.1 is now 10.9/34.5 -Check anemia panel in the a.m. -Continue to monitor for signs and symptoms of bleeding; currently no overt bleeding noted -Repeat CBC in a.m.  We will continue to monitor patient's clinical response to intervention and continue to monitor and trend daily inflammatory markers  and repeat a chest x-ray in a.m.

## 2019-01-17 NOTE — Progress Notes (Signed)
CRITICAL VALUE ALERT  Critical Value:  COVID +  Date & Time Notied:  01/17/19 1526  Provider Notified: MD  Orders Received/Actions taken:

## 2019-01-18 ENCOUNTER — Inpatient Hospital Stay (HOSPITAL_COMMUNITY): Payer: Medicare Other

## 2019-01-18 DIAGNOSIS — R911 Solitary pulmonary nodule: Secondary | ICD-10-CM

## 2019-01-18 DIAGNOSIS — I712 Thoracic aortic aneurysm, without rupture: Secondary | ICD-10-CM

## 2019-01-18 DIAGNOSIS — J9601 Acute respiratory failure with hypoxia: Secondary | ICD-10-CM

## 2019-01-18 LAB — CBC WITH DIFFERENTIAL/PLATELET
Abs Immature Granulocytes: 0.05 10*3/uL (ref 0.00–0.07)
Basophils Absolute: 0 10*3/uL (ref 0.0–0.1)
Basophils Relative: 0 %
Eosinophils Absolute: 0 10*3/uL (ref 0.0–0.5)
Eosinophils Relative: 0 %
HCT: 33 % — ABNORMAL LOW (ref 36.0–46.0)
Hemoglobin: 10.4 g/dL — ABNORMAL LOW (ref 12.0–15.0)
Immature Granulocytes: 1 %
Lymphocytes Relative: 12 %
Lymphs Abs: 0.6 10*3/uL — ABNORMAL LOW (ref 0.7–4.0)
MCH: 30.2 pg (ref 26.0–34.0)
MCHC: 31.5 g/dL (ref 30.0–36.0)
MCV: 95.9 fL (ref 80.0–100.0)
Monocytes Absolute: 0.8 10*3/uL (ref 0.1–1.0)
Monocytes Relative: 18 %
Neutro Abs: 3.3 10*3/uL (ref 1.7–7.7)
Neutrophils Relative %: 69 %
Platelets: 184 10*3/uL (ref 150–400)
RBC: 3.44 MIL/uL — ABNORMAL LOW (ref 3.87–5.11)
RDW: 13.4 % (ref 11.5–15.5)
WBC: 4.7 10*3/uL (ref 4.0–10.5)
nRBC: 0 % (ref 0.0–0.2)

## 2019-01-18 LAB — COMPREHENSIVE METABOLIC PANEL
ALT: 18 U/L (ref 0–44)
AST: 22 U/L (ref 15–41)
Albumin: 2.7 g/dL — ABNORMAL LOW (ref 3.5–5.0)
Alkaline Phosphatase: 70 U/L (ref 38–126)
Anion gap: 8 (ref 5–15)
BUN: 32 mg/dL — ABNORMAL HIGH (ref 8–23)
CO2: 25 mmol/L (ref 22–32)
Calcium: 9 mg/dL (ref 8.9–10.3)
Chloride: 103 mmol/L (ref 98–111)
Creatinine, Ser: 0.9 mg/dL (ref 0.44–1.00)
GFR calc Af Amer: >60 mL/min (ref >60)
GFR calc non Af Amer: 58 mL/min — ABNORMAL LOW (ref >60)
Glucose, Bld: 102 mg/dL — ABNORMAL HIGH (ref 70–99)
Potassium: 5.1 mmol/L (ref 3.5–5.1)
Sodium: 136 mmol/L (ref 135–145)
Total Bilirubin: 0.3 mg/dL (ref 0.3–1.2)
Total Protein: 5.9 g/dL — ABNORMAL LOW (ref 6.5–8.1)

## 2019-01-18 LAB — LACTATE DEHYDROGENASE: LDH: 144 U/L (ref 98–192)

## 2019-01-18 LAB — SEDIMENTATION RATE: Sed Rate: 42 mm/hr — ABNORMAL HIGH (ref 0–22)

## 2019-01-18 LAB — D-DIMER, QUANTITATIVE: D-Dimer, Quant: 1.45 ug/mL-FEU — ABNORMAL HIGH (ref 0.00–0.50)

## 2019-01-18 LAB — PHOSPHORUS: Phosphorus: 2.4 mg/dL — ABNORMAL LOW (ref 2.5–4.6)

## 2019-01-18 LAB — MAGNESIUM: Magnesium: 1.9 mg/dL (ref 1.7–2.4)

## 2019-01-18 LAB — C-REACTIVE PROTEIN
CRP: 2.5 mg/dL — ABNORMAL HIGH (ref <1.0)
CRP: 2.4 mg/dL — ABNORMAL HIGH (ref <1.0)

## 2019-01-18 LAB — FIBRINOGEN: Fibrinogen: 511 mg/dL — ABNORMAL HIGH (ref 210–475)

## 2019-01-18 LAB — FERRITIN: Ferritin: 149 ng/mL (ref 11–307)

## 2019-01-18 MED ORDER — IPRATROPIUM-ALBUTEROL 20-100 MCG/ACT IN AERS
1.0000 | INHALATION_SPRAY | Freq: Two times a day (BID) | RESPIRATORY_TRACT | Status: DC
  Administered 2019-01-19 – 2019-01-25 (×13): 1 via RESPIRATORY_TRACT
  Filled 2019-01-18: qty 4

## 2019-01-18 MED ORDER — SODIUM PHOSPHATES 45 MMOLE/15ML IV SOLN
10.0000 mmol | Freq: Once | INTRAVENOUS | Status: AC
  Administered 2019-01-18: 10 mmol via INTRAVENOUS
  Filled 2019-01-18: qty 3.33

## 2019-01-18 NOTE — Progress Notes (Addendum)
PROGRESS NOTE    [name]  [id number] DOB: 04/16/1930 DOA: 01/16/2019 PCP: Lajean Manes, MD   Brief Narrative:  The patient is an 85 year old Caucasian female from [facility] facility with a past medical history significant for but not limited to anemia, arthritis, history of colostomy now reversed, chronic diarrhea, COPD, GERD, hypertension and other comorbidities who presented to the hospital after being tested positive for COVID-19 disease.  Patient tested positive for COVID-19 at her facility.  She was sent by her son to be checked out and her only complaint was sneezing.  In the ED she is found to be febrile with oxygen saturation intermittently dropping to the mid to high 80s and she was placed on supplemental oxygen via nasal cannula 2 L with she is off of it now.  Initially she was not neutropenic but repeat CBC this morning showed neutropenia and also showed a hemoglobin of 10.9.  She has been given steroids and remdesivir and has been weaned off of oxygen and is doing better.  Assessment & Plan:   Principal Problem:   Pneumonia due to COVID-19 virus Active Problems:   Sepsis ([facility])   Acute respiratory failure with hypoxia ([place])   Pulmonary nodule   Thoracic aortic aneurysm ([facility])  Acute Hypoxic Respiratory Failure secondary to COVID-19 multifocal pneumonia and SIRS from COVID-19 in a patient with underlying COPD, improving -Tested positive for COVID-19 11/19. -Oxygen saturation intermittently dropping to the mid to high 80s and was patient placed on 2 L supplemental oxygen and now weaned off -Temperature 102.2 F. Slightly tachycardic and tachypneic. No leukocytosis, does have lymphopenia.  -Lactic acid 2.9 -> 0.7. Procalcitonin <0.10. -Inflammatory markers elevated but trending down now: D-dimer 2.09 -> 1.74 -> 1.45, fibrinogen 582 -> 541 -> 511; CRP went from 3.7 -> 3.1 -> 2.4.Ferritin went from 142 -> 146 -> 149; LDH went from 170 -> 143 -> 144 -CT angiogram  negative for PE but showing evidence of multifocal pneumonia and scattered areas of atelectasis. -Repeat CXR this AM showed "Developing patchy airspace opacities in the right mid lung and medial right base consistent with the clinical history of COVID. Hyperinflation and chronic bronchitic changes consistent with the clinical history of COPD." -C/w Decadron 6 mg Daily  -C/w Remdesivir -Vitamin C and zinc -C/w Antitussives with Robitussin DM -Tylenol as needed -Incentive spirometry -Albuterol prn and Combivent scheduled Inhaler Treatments  -Trend Daily Inflammatory Markers -Continuous pulse ox -Supplemental oxygen to keep oxygen saturation above 90% -Blood culture x2 pending -Maintain euvolemia to net negative volume status  Hypokalemia -Potassium was 3.3 and now is 5.1 -Continue to monitor and Replete as Necessary -Rpeat CMP in a.m.  Thyroid Mass/Multinodular Goiter -CT showingdominant mass arising from right lobe of thyroid measuring 3.3 x 2.5 cm. -Thyroid ultrasound was ordered but will cancel have the patient do this in outpatient setting -Check TSH and was 1.417, free T4 level was 1.02  Pulmonary Nodules -CT showing nodular opacities in the right middle lobe, largest measuring 7 mm.Patient is a former smoker. -Repeat chest CT in 61months  Thoracic aortic aneurysm -CT showingascending thoracic aorta measures 4.1 x 4.1 cm. No dissection evident. There is aortic atherosclerosis as well as foci of great vessel and coronary artery calcification. -Patient will need annual imaging follow-up by CTA or MRI  Chronic Diarrhea -Continue with home Lomotil  Normocytic Anemia Patient's hemoglobin/hematocrit went from 11.9/38.1 -> 10.9/34.5 -> 10.4/33.0 -Check anemia panel in the a.m. -Continue to monitor for signs and symptoms of bleeding;  currently no overt bleeding noted -Repeat CBC in a.m.  Hypophosphatemia -Patient's Phos Level was 2.4 this AM -Replete with IV Sodium  Phos 10 mmol -Continue to Monitor and Replete as Necessary -Repeat Phos Level in AM   Elevated BUN -In the setting of steroid demargination is-BUN was 32 -Continue to monitor and trend repeat CMP in AM   DVT prophylaxis: Enoxaparin 40 mg sq q24h Code Status: FULL CODE Family Communication: No family present Disposition Plan: Pending PT/OT Evaluation and likely SNF placement   Consultants:   None   Procedures: None  Antimicrobials:  Anti-infectives (From admission, onward)   Start     Dose/Rate Route Frequency Ordered Stop   01/18/19 1000  remdesivir 100 mg in sodium chloride 0.9 % 250 mL IVPB     100 mg 500 mL/hr over 30 Minutes Intravenous Every 24 hours 01/17/19 0320 01/22/19 0959   01/17/19 0400  remdesivir 200 mg in sodium chloride 0.9 % 250 mL IVPB     200 mg 500 mL/hr over 30 Minutes Intravenous Once 01/17/19 0320 01/17/19 0710     Subjective: Seen and examined at bedside states that she is feeling a little bit better.  No nausea or vomiting he denies any cough or shortness of breath.  No lightheadedness or dizziness and no other concerns reported at this time.  Objective: Vitals:   01/17/19 1222 01/17/19 2149 01/18/19 0435 01/18/19 1253  BP: (!) 154/82 (!) 147/79 (!) 142/89 (!) 160/76  Pulse: 87 89 85 82  Resp: 20 18 19 19   Temp: 99.2 F (37.3 C) 98.4 F (36.9 C) 98.3 F (36.8 C) 97.9 F (36.6 C)  TempSrc: Oral Oral Oral Oral  SpO2: 98% 97% 94% 97%  Weight:      Height:        Intake/Output Summary (Last 24 hours) at 01/18/2019 1725 Last data filed at 01/18/2019 1300 Gross per 24 hour  Intake 240 ml  Output -  Net 240 ml   Filed Weights   01/17/19 0644  Weight: 51.3 kg   Examination: Physical Exam:  Constitutional: Thin Caucasian elderly female in NAD and appears calm and  Eyes: Lids and conjunctivae normal, sclerae anicteric  ENMT: External Ears, Nose appear normal. Grossly normal hearing. Mucous membranes are moist.  Neck: Appears normal,  supple, no cervical masses, normal ROM, no appreciable thyromegaly; no JVD Respiratory: Diminished to auscultation bilaterally, no wheezing, rales, rhonchi or crackles. Normal respiratory effort and patient is not tachypenic. No accessory muscle use. Unlabored breathing Cardiovascular: RRR, no murmurs / rubs / gallops. S1 and S2 auscultated. Mild extremity edema.  Abdomen: Soft, non-tender, non-distended. Bowel sounds positive x4. Prior Colostomy scar noted.  GU: Deferred. Musculoskeletal: No clubbing / cyanosis of digits/nails. No joint deformity upper and lower extremities Skin: No rashes, lesions, ulcers on a limited skin evaluation. No induration; Warm and dry.  Neurologic: CN 2-12 grossly intact with no focal deficits. Romberg sign and cerebellar reflexes not assessed.  Psychiatric: Normal judgment and insight. Alert and oriented x 3. Normal mood and appropriate affect.   Data Reviewed: I have personally reviewed following labs and imaging studies  CBC: Recent Labs  Lab 01/16/19 2151 01/17/19 0451 01/18/19 0524  WBC 4.2 3.8* 4.7  NEUTROABS 3.1 2.3 3.3  HGB 11.9* 10.9* 10.4*  HCT 38.1 34.5* 33.0*  MCV 96.9 96.6 95.9  PLT 187 177 [id number]   Basic Metabolic Panel: Recent Labs  Lab 01/16/19 2151 01/17/19 0248 01/17/19 0451 01/17/19 0901 01/18/19 0524  NA 135  --  136  --  136  K 3.3*  --  3.5  --  5.1  CL 97*  --  99  --  103  CO2 24  --  26  --  25  GLUCOSE 139*  --  105*  --  102*  BUN 24*  --  21  --  32*  CREATININE 1.03*  --  0.83  --  0.90  CALCIUM 8.6*  --  8.6*  --  9.0  MG  --  2.0  --  1.9 1.9  PHOS  --   --   --  2.5 2.4*   GFR: Estimated Creatinine Clearance: 36.3 mL/min (by C-G formula based on SCr of 0.9 mg/dL). Liver Function Tests: Recent Labs  Lab 01/16/19 2151 01/18/19 0524  AST 35 22  ALT 21 18  ALKPHOS 89 70  BILITOT 0.3 0.3  PROT 7.2 5.9*  ALBUMIN 3.4* 2.7*   No results for input(s): LIPASE, AMYLASE in the last 168 hours. No results for  input(s): AMMONIA in the last 168 hours. Coagulation Profile: No results for input(s): INR, PROTIME in the last 168 hours. Cardiac Enzymes: No results for input(s): CKTOTAL, CKMB, CKMBINDEX, TROPONINI in the last 168 hours. BNP (last 3 results) No results for input(s): PROBNP in the last 8760 hours. HbA1C: No results for input(s): HGBA1C in the last 72 hours. CBG: No results for input(s): GLUCAP in the last 168 hours. Lipid Profile: Recent Labs    01/16/19 2151  TRIG 48   Thyroid Function Tests: Recent Labs    01/17/19 0901  TSH 1.417  FREET4 1.02   Anemia Panel: Recent Labs    01/17/19 0901 01/18/19 0524  FERRITIN 146 149   Sepsis Labs: Recent Labs  Lab 01/16/19 2151 01/16/19 2351  PROCALCITON <0.10  --   LATICACIDVEN 2.9* 0.7    Recent Results (from the past 240 hour(s))  Blood Culture (routine x 2)     Status: None (Preliminary result)   Collection Time: 01/16/19  9:51 PM   Specimen: BLOOD  Result Value Ref Range Status   Specimen Description   Final    BLOOD LEFT ANTECUBITAL Performed at [facility], [place] [address] [place], [place] [address]    Special Requests   Final    BOTTLES DRAWN AEROBIC AND ANAEROBIC Blood Culture adequate volume Performed at [facility] [address], [place], [place] [address]    Culture   Final    NO GROWTH 2 DAYS Performed at [facility] [address] [place], [place] [address]    Report Status PENDING  Incomplete  Blood Culture (routine x 2)     Status: None (Preliminary result)   Collection Time: 01/16/19  9:56 PM   Specimen: BLOOD  Result Value Ref Range Status   Specimen Description   Final    BLOOD RIGHT ANTECUBITAL Performed at [facility] [address], [place], [place] [address]    Special Requests   Final    BOTTLES DRAWN AEROBIC AND ANAEROBIC Blood Culture adequate volume Performed at [facility] [address], [place], [place] [address]    Culture   Final    NO GROWTH 2 DAYS Performed at [facility] [address] [place], [place] [address]    Report Status PENDING  Incomplete  SARS CORONAVIRUS 2 (TAT 6-24 HRS) Nasopharyngeal Nasopharyngeal Swab     Status: Abnormal   Collection Time: 01/17/19  1:13 AM   Specimen: Nasopharyngeal Swab  Result Value Ref Range Status   SARS Coronavirus 2 POSITIVE (A) NEGATIVE Final    Comment: RESULT CALLED TO, READ BACK BY AND VERIFIED WITH: Henrietta Hoover RN 15:25 01/17/19 ([email or web address]) (NOTE) SARS-CoV-2 target nucleic acids are DETECTED. The SARS-CoV-2 RNA is generally detectable in upper and lower respiratory specimens during the acute phase of infection. Positive results are indicative of active infection with SARS-CoV-2. Clinical  correlation with patient history and other diagnostic information is necessary to determine patient infection status. Positive results do  not rule out bacterial infection or co-infection with other viruses. The expected result is Negative. Fact Sheet for Patients: SugarRoll.be Fact Sheet for Healthcare Providers: https://www.woods-mathews.com/ This test is not yet approved or cleared by the [place] FDA and  has been authorized for detection and/or diagnosis of SARS-CoV-2 by FDA under an Emergency Use Authorization (EUA). This EUA will remain  in effect (meaning this test can be used) fo r the duration of the COVID-19 declaration under Section 564(b)(1) of the Act, 21 U.S.C. section 360bbb-3(b)(1), unless the authorization is terminated or revoked sooner. Performed at [facility], [place] [address] [place], [place] [address]   MRSA PCR Screening     Status: None   Collection Time: 01/17/19  6:24 AM   Specimen: Nasopharyngeal  Result Value Ref Range Status   MRSA by PCR NEGATIVE NEGATIVE Final    Comment:        The GeneXpert MRSA Assay (FDA approved for NASAL  specimens only), is one component of a comprehensive MRSA colonization surveillance program. It is not intended to diagnose MRSA infection nor to guide or monitor treatment for MRSA infections. Performed at [facility], [place] [address], [place], [place] [address]      RN Pressure Injury Documentation: Pressure Injury 12/20/18 Coccyx Medial Stage II -  Partial thickness loss of dermis presenting as a shallow open ulcer with a red, pink wound bed without slough. pink, open skin, non-blanchable (Active)  12/20/18 1415  Location: Coccyx  Location Orientation: Medial  Staging: Stage II -  Partial thickness loss of dermis presenting as a shallow open ulcer with a red, pink wound bed without slough.  Wound Description (Comments): pink, open skin, non-blanchable  Present on Admission: Yes    Radiology Studies: Ct Angio Chest Pe W Or Wo Contrast  Result Date: 01/17/2019 CLINICAL DATA:  Shortness of breath.  COVID-19 positive EXAM: CT ANGIOGRAPHY CHEST WITH CONTRAST TECHNIQUE: Multidetector CT imaging of the chest was performed using the standard protocol during bolus administration of intravenous contrast. Multiplanar CT image reconstructions and MIPs were obtained to evaluate the vascular anatomy. CONTRAST:  134mL OMNIPAQUE IOHEXOL 350 MG/ML SOLN COMPARISON:  Chest radiograph January 16, 2019 FINDINGS: Cardiovascular: There is no demonstrable pulmonary embolus. The ascending thoracic aortic diameter measures 4.1 x 4.1 cm. There is no appreciable thoracic aortic dissection. There are foci of calcification in visualized great vessels. There is aortic atherosclerosis as well as multiple foci of coronary artery calcification. There is a small pericardial effusion. There is no pericardial thickening. Mediastinum/Nodes: There are multiple subcentimeter nodular lesions in the thyroid consistent with multinodular goiter. There is a solid mass arising from the lower pole of the right lobe  of the thyroid measuring 3.3 x 2.5 cm. There is focal calcification in the central aspect of this mass. There is no evident thoracic adenopathy. No esophageal lesions are evident. Lungs/Pleura: There is scarring in the apex regions. There  is ground-glass type opacity in the medial segment right middle lobe. Similar ground-glass type opacity is noted in the lateral and posterior segments of the left lower lobe. A small focus of probable developing ground-glass opacity is noted in the posterior segment right lower lobe. There is bibasilar atelectasis. There are no appreciable pleural effusions. There is mild bilateral lower lobe bronchiectasis. On axial slice 78 series 10, there is a 7 x 7 mm nodular opacity in the medial segment right middle lobe. There is an immediately adjacent 4 mm nodular opacity in this area seen on axial slice 77 series 10. Upper Abdomen: There is upper abdominal aortic atherosclerosis. Visualized upper abdominal structures otherwise appear unremarkable. Musculoskeletal: There is degenerative change in the thoracic spine. There are no blastic or lytic bone lesions. There is a total shoulder replacement on the left. There are no chest wall lesions evident. Review of the MIP images confirms the above findings. IMPRESSION: 1.  No demonstrable pulmonary embolus. 2. Ascending thoracic aorta measures 4.1 x 4.1 cm. No dissection evident. There is aortic atherosclerosis as well as foci of great vessel and coronary artery calcification. Recommend annual imaging followup by CTA or MRA. This recommendation follows 2010 ACCF/AHA/AATS/ACR/ASA/SCA/SCAI/SIR/STS/SVM Guidelines for the Diagnosis and Management of Patients with Thoracic Aortic Disease. Circulation. 2010; [id number]. Aortic aneurysm NOS (ICD10-I71.9). 3. Multifocal pneumonia with the greatest degree of airspace opacity in the right middle lobe and left lower lobe regions. There are scattered areas of atelectasis as well. 4. Nodular opacities  in the right middle lobe, largest measuring 7 mm. Non-contrast chest CT at 6-12 months is recommended. If the nodule is stable at time of repeat CT, then future CT at 18-24 months (from today's scan) is considered optional for low-risk patients, but is recommended for high-risk patients. This recommendation follows the consensus statement: Guidelines for Management of Incidental Pulmonary Nodules Detected on CT Images: From the Fleischner Society 2017; Radiology 2017; 284:228-243. 5. Dominant mass arising from right lobe of thyroid measuring 3.3 x 2.5 cm. Advise correlation with thyroid ultrasound given this finding. 6.  Mild lower lobe bronchiectatic change. 7.  No evident adenopathy. Aortic Atherosclerosis (ICD10-I70.0). Electronically Signed   By: Lowella Grip III M.D.   On: 01/17/2019 08:49   Dg Chest Port 1 View  Result Date: 01/18/2019 CLINICAL DATA:  85 year old female with shortness of breath, COVID pneumonia EXAM: PORTABLE CHEST 1 VIEW COMPARISON:  Prior chest x-ray 01/16/2019 FINDINGS: Developing patchy airspace opacities in the medial right base and right mid lung. These changes are superimposed on a background of chronic bronchitic changes and interstitial prominence. Stable cardiac and mediastinal contours. Atherosclerotic calcifications present in the thoracic aorta. Surgical changes of prior left shoulder arthroplasty. High-riding right humeral head suggests chronic rotator cuff tear. No pleural effusion or pneumothorax. IMPRESSION: Developing patchy airspace opacities in the right mid lung and medial right base consistent with the clinical history of COVID. Hyperinflation and chronic bronchitic changes consistent with the clinical history of COPD. Electronically Signed   By: Jacqulynn Cadet M.D.   On: 01/18/2019 13:20   Dg Chest Portable 1 View  Result Date: 01/16/2019 CLINICAL DATA:  COVID positive EXAM: PORTABLE CHEST 1 VIEW COMPARISON:  Chest radiograph dated 12/20/2018 FINDINGS:  The heart size is normal. Vascular calcifications are seen in the aortic arch. The lungs are hyperinflated. Mild biapical pleuroparenchymal scarring is unchanged. The patient is status post a reverse left shoulder arthroplasty. IMPRESSION: Hyperinflation likely reflecting chronic obstructive pulmonary disease. No focal consolidation. Electronically Signed  By: Zerita Boers M.D.   On: 01/16/2019 22:14   Scheduled Meds: . dexamethasone (DECADRON) injection  6 mg Intravenous Q0600  . diphenoxylate-atropine  1 tablet Oral TID AC  . enoxaparin (LOVENOX) injection  40 mg Subcutaneous Q0600  . glycopyrrolate  1 mg Oral TID AC  . Ipratropium-Albuterol  1 puff Inhalation Q6H  . mirabegron ER  50 mg Oral QHS  . multivitamin with minerals  1 tablet Oral Daily  . Ensure Max Protein  11 oz Oral TID PC  . vitamin C  500 mg Oral Daily  . zinc sulfate  220 mg Oral Daily   Continuous Infusions: . remdesivir 100 mg in NS 250 mL 100 mg (01/18/19 1020)    LOS: 1 day   Kerney Elbe, DO [facility] PAGER is on [place]  If 7PM-7AM, please contact night-coverage [email or web address]

## 2019-01-19 ENCOUNTER — Inpatient Hospital Stay (HOSPITAL_COMMUNITY): Payer: Medicare Other

## 2019-01-19 LAB — PHOSPHORUS: Phosphorus: 2.7 mg/dL (ref 2.5–4.6)

## 2019-01-19 LAB — COMPREHENSIVE METABOLIC PANEL
ALT: 16 U/L (ref 0–44)
AST: 19 U/L (ref 15–41)
Albumin: 2.6 g/dL — ABNORMAL LOW (ref 3.5–5.0)
Alkaline Phosphatase: 77 U/L (ref 38–126)
Anion gap: 10 (ref 5–15)
BUN: 37 mg/dL — ABNORMAL HIGH (ref 8–23)
CO2: 27 mmol/L (ref 22–32)
Calcium: 8.9 mg/dL (ref 8.9–10.3)
Chloride: 98 mmol/L (ref 98–111)
Creatinine, Ser: 0.82 mg/dL (ref 0.44–1.00)
GFR calc Af Amer: >60 mL/min (ref >60)
GFR calc non Af Amer: >60 mL/min (ref >60)
Glucose, Bld: 125 mg/dL — ABNORMAL HIGH (ref 70–99)
Potassium: 4.9 mmol/L (ref 3.5–5.1)
Sodium: 135 mmol/L (ref 135–145)
Total Bilirubin: 0.2 mg/dL — ABNORMAL LOW (ref 0.3–1.2)
Total Protein: 6.1 g/dL — ABNORMAL LOW (ref 6.5–8.1)

## 2019-01-19 LAB — CBC WITH DIFFERENTIAL/PLATELET
Abs Immature Granulocytes: 0.1 10*3/uL — ABNORMAL HIGH (ref 0.00–0.07)
Basophils Absolute: 0 10*3/uL (ref 0.0–0.1)
Basophils Relative: 0 %
Eosinophils Absolute: 0 10*3/uL (ref 0.0–0.5)
Eosinophils Relative: 0 %
HCT: 36 % (ref 36.0–46.0)
Hemoglobin: 11.4 g/dL — ABNORMAL LOW (ref 12.0–15.0)
Immature Granulocytes: 2 %
Lymphocytes Relative: 11 %
Lymphs Abs: 0.6 10*3/uL — ABNORMAL LOW (ref 0.7–4.0)
MCH: 30.1 pg (ref 26.0–34.0)
MCHC: 31.7 g/dL (ref 30.0–36.0)
MCV: 95 fL (ref 80.0–100.0)
Monocytes Absolute: 0.8 10*3/uL (ref 0.1–1.0)
Monocytes Relative: 15 %
Neutro Abs: 4 10*3/uL (ref 1.7–7.7)
Neutrophils Relative %: 72 %
Platelets: 228 10*3/uL (ref 150–400)
RBC: 3.79 MIL/uL — ABNORMAL LOW (ref 3.87–5.11)
RDW: 13.5 % (ref 11.5–15.5)
WBC: 5.6 10*3/uL (ref 4.0–10.5)
nRBC: 0 % (ref 0.0–0.2)

## 2019-01-19 LAB — D-DIMER, QUANTITATIVE: D-Dimer, Quant: 0.96 ug/mL-FEU — ABNORMAL HIGH (ref 0.00–0.50)

## 2019-01-19 LAB — C-REACTIVE PROTEIN
CRP: 1.5 mg/dL — ABNORMAL HIGH (ref <1.0)
CRP: 1.5 mg/dL — ABNORMAL HIGH (ref <1.0)

## 2019-01-19 LAB — MAGNESIUM: Magnesium: 2 mg/dL (ref 1.7–2.4)

## 2019-01-19 LAB — LACTATE DEHYDROGENASE: LDH: 148 U/L (ref 98–192)

## 2019-01-19 LAB — SEDIMENTATION RATE: Sed Rate: 57 mm/hr — ABNORMAL HIGH (ref 0–22)

## 2019-01-19 LAB — FERRITIN: Ferritin: 172 ng/mL (ref 11–307)

## 2019-01-19 LAB — FIBRINOGEN: Fibrinogen: 499 mg/dL — ABNORMAL HIGH (ref 210–475)

## 2019-01-19 NOTE — Progress Notes (Signed)
PROGRESS NOTE    [name]  OIZ:[id number] DOB: 05/12/1930 DOA: 01/16/2019 PCP: Lajean Manes, MD   Brief Narrative:  The patient is an 85 year old Caucasian female from [place] facility with a past medical history significant for but not limited to anemia, arthritis, chronic diarrhea from her prior Colon Surgery, COPD, GERD, hypertension and other comorbidities who presented to the hospital after being tested positive for COVID-19 disease.  Patient tested positive for COVID-19 at her facility.  She was sent by her son to be checked out and her only complaint was sneezing.  In the ED she is found to be febrile with oxygen saturation intermittently dropping to the mid to high 80s and she was placed on supplemental oxygen via nasal cannula 2 L with she is off of it now.  Initially she was not neutropenic but repeat CBC this morning showed neutropenia and also showed a hemoglobin of 10.9.  She has been given steroids and remdesivir and has been weaned off of oxygen and is doing better.  Physical therapy and Occupational Therapy asked still to see the patient and likely she will need SNF and will require social worker assistance  Assessment & Plan:   Principal Problem:   Pneumonia due to COVID-19 virus Active Problems:   Sepsis ([place])   Acute respiratory failure with hypoxia ([place])   Pulmonary nodule   Thoracic aortic aneurysm ([place])  Acute Hypoxic Respiratory Failure secondary to COVID-19 multifocal pneumonia and SIRS from COVID-19 in a patient with underlying COPD, improving -Tested positive for COVID-19 11/19. -Oxygen saturation intermittently dropping to the mid to high 80s and was patient placed on 2 L supplemental oxygen and now weaned off -Temperature 102.2 F initially andSlightly tachycardic and tachypneic. No leukocytosis, does have lymphopenia.  -Lactic acid 2.9 -> 0.7. Procalcitonin <0.10. -Inflammatory markers elevated but trending down now: Recent Labs     01/17/19 0901 01/18/19 0524 01/19/19 0325  DDIMER 1.74* 1.45* 0.96*  FERRITIN 146 149 172  LDH 143 144 148  CRP 3.1* 2.4*  2.5* 1.5*  1.5*  -Fibrinogen 582 -> 541 -> 511 -> 499 -ESR went from 52 -> 42 -> 57 Lab Results  Component Value Date   SARSCOV2NAA POSITIVE (A) 01/17/2019   [place] NEGATIVE 12/20/2018  -CT angiogram negative for PE but showing evidence of multifocal pneumonia and scattered areas of atelectasis. -Repeat CXR this AM showed "No substantial change. Patchy airspace disease right mid lung and left base. Emphysema with chronic interstitial coarsening." -C/w Decadron 6 mg Daily  -C/w Remdesivir for 5 Days -Vitamin C and Zinc -C/w Antitussives with Robitussin DM -Tylenol as needed -Incentive spirometry -Albuterol prn and Combivent scheduled Inhaler Treatments  -Trend Daily Inflammatory Markers as above -Continuous pulse ox -Supplemental oxygen to keep oxygen saturation above 90% -Blood culture x2 showed NGTD at 3 Days -Maintain euvolemia to net negative volume status -Repeat CXR in AM   Hypokalemia -Potassium was 3.3 and now is 4.9 -Potassium Supplementation in IV Stopped  -Continue to monitor and Replete as Necessary -Rpeat CMP in a.m.  Thyroid Mass/Multinodular Goiter -CT showingdominant mass arising from right lobe of thyroid measuring 3.3 x 2.5 cm. -Thyroid ultrasound was ordered but will cancel have the patient do this in outpatient setting -Check TSH and was 1.417, free T4 level was 1.02  Pulmonary Nodules -CT showing nodular opacities in the right middle lobe, largest measuring 7 mm.Patient is a former smoker. -Repeat chest CT in 81month  Thoracic Aortic Aneurysm -CT showingascending thoracic aorta measures 4.1  x 4.1 cm. No dissection evident. There is aortic atherosclerosis as well as foci of great vessel and coronary artery calcification. -Patient will need annual imaging follow-up by CTA or MRI  Chronic Diarrhea -Continue with  home Lomotil  Normocytic Anemia Patient's hemoglobin/hematocrit went from 11.9/38.1 -> 10.9/34.5 -> 10.4/33.0 -> 11.4/36.0 -Check anemia panel in the a.m. -Continue to monitor for signs and symptoms of bleeding; currently no overt bleeding noted -Repeat CBC in a.m.  Hypophosphatemia -Patient's Phos Level was 2.7 this AM -Replete with IV Sodium Phos 10 mmol yesterday  -Continue to Monitor and Replete as Necessary -Repeat Phos Level in AM   Elevated BUN -In the setting of steroid demargination as BUN was 32  -Continue to monitor and trend repeat CMP in AM   DVT prophylaxis: Enoxaparin 40 mg sq q24h Code Status: FULL CODE Family Communication: No family present Disposition Plan: Pending PT/OT Evaluation and likely SNF placement   Consultants:   None   Procedures: None  Antimicrobials:  Anti-infectives (From admission, onward)   Start     Dose/Rate Route Frequency Ordered Stop   01/18/19 1000  remdesivir 100 mg in sodium chloride 0.9 % 250 mL IVPB     100 mg 500 mL/hr over 30 Minutes Intravenous Every 24 hours 01/17/19 0320 01/22/19 0959   01/17/19 0400  remdesivir 200 mg in sodium chloride 0.9 % 250 mL IVPB     200 mg 500 mL/hr over 30 Minutes Intravenous Once 01/17/19 0320 01/17/19 0710     Subjective: Seen and examined at bedside and has no complaints but states that she did not sleep very well last night because people were disturbing her.  No nausea or vomiting.  Denies any shortness of breath, lightheadedness, or dizziness.  Objective: Vitals:   01/18/19 2122 01/18/19 2144 01/19/19 0531 01/19/19 1134  BP:  (!) 154/84 (!) 160/97 (!) 144/84  Pulse:  85 85 89  Resp:  _0 Temp:  98 F (36.7 C) 98.1 F (36.7 C) 98.9 F (37.2 C)  TempSrc:  Oral Oral Oral  SpO2: 98% 98% 95% 97%  Weight:      Height:        Intake/Output Summary (Last 24 hours) at 01/19/2019 1504 Last data filed at 01/19/2019 0700 Gross per 24 hour  Intake 744.52 ml  Output 1250 ml   Net -505.48 ml   Filed Weights   01/17/19 0644  Weight: 51.3 kg   Examination: Physical Exam:  Constitutional: Thin Caucasian female in NAD and appears calm and comfortable Eyes: Lids and conjunctivae normal, sclerae anicteric  ENMT: External Ears, Nose appear normal. Grossly normal hearing. Mucous membranes are moist.  Neck: Appears normal, supple, no cervical masses, normal ROM, no appreciable thyromegaly; no JVD Respiratory: Diminished to auscultation bilaterally, no wheezing, rales, rhonchi or crackles. Normal respiratory effort and patient is not tachypenic. No accessory muscle use. Unlabored Breathing  Cardiovascular: RRR, no murmurs / rubs / gallops. S1 and S2 auscultated. Trace extremity edema Abdomen: Soft, non-tender, non-distended. Bowel sounds positive x4.  GU: Deferred. Musculoskeletal: No clubbing / cyanosis of digits/nails. No joint deformity upper and lower extremities. Skin: No rashes, lesions, ulcers on a limited skin evaluation. No induration; Warm and dry.  Neurologic: CN 2-12 grossly intact with no focal deficits.  Romberg sign and cerebellar reflexes not assessed.  Psychiatric: Normal judgment and insight. Alert and oriented x 3. Normal mood and appropriate affect.   Data Reviewed: I have personally reviewed following labs and imaging studies  CBC: Recent Labs  Lab 01/16/19 2151 01/17/19 0451 01/18/19 0524 01/19/19 0325  WBC 4.2 3.8* 4.7 5.6  NEUTROABS 3.1 2.3 3.3 4.0  HGB 11.9* 10.9* 10.4* 11.4*  HCT 38.1 34.5* 33.0* 36.0  MCV 96.9 96.6 95.9 95.0  PLT 187 177 184 419   Basic Metabolic Panel: Recent Labs  Lab 01/16/19 2151 01/17/19 0248 01/17/19 0451 01/17/19 0901 01/18/19 0524 01/19/19 0325  NA 135  --  136  --  136 135  K 3.3*  --  3.5  --  5.1 4.9  CL 97*  --  99  --  103 98  CO2 24  --  26  --  25 27  GLUCOSE 139*  --  105*  --  102* 125*  BUN 24*  --  21  --  32* 37*  CREATININE 1.03*  --  0.83  --  0.90 0.82  CALCIUM 8.6*  --  8.6*   --  9.0 8.9  MG  --  2.0  --  1.9 1.9 2.0  PHOS  --   --   --  2.5 2.4* 2.7   GFR: Estimated Creatinine Clearance: 39.9 mL/min (by C-G formula based on SCr of 0.82 mg/dL). Liver Function Tests: Recent Labs  Lab 01/16/19 2151 01/18/19 0524 01/19/19 0325  AST 35 22 19  ALT _0 ALKPHOS 89 70 77  BILITOT 0.3 0.3 0.2*  PROT 7.2 5.9* 6.1*  ALBUMIN 3.4* 2.7* 2.6*   No results for input(s): LIPASE, AMYLASE in the last 168 hours. No results for input(s): AMMONIA in the last 168 hours. Coagulation Profile: No results for input(s): INR, PROTIME in the last 168 hours. Cardiac Enzymes: No results for input(s): CKTOTAL, CKMB, CKMBINDEX, TROPONINI in the last 168 hours. BNP (last 3 results) No results for input(s): PROBNP in the last 8760 hours. HbA1C: No results for input(s): HGBA1C in the last 72 hours. CBG: No results for input(s): GLUCAP in the last 168 hours. Lipid Profile: Recent Labs    01/16/19 2151  TRIG 48   Thyroid Function Tests: Recent Labs    01/17/19 0901  TSH 1.417  FREET4 1.02   Anemia Panel: Recent Labs    01/18/19 0524 01/19/19 0325  FERRITIN 149 172   Sepsis Labs: Recent Labs  Lab 01/16/19 2151 01/16/19 2351  PROCALCITON <0.10  --   LATICACIDVEN 2.9* 0.7    Recent Results (from the past 240 hour(s))  Blood Culture (routine x 2)     Status: None (Preliminary result)   Collection Time: 01/16/19  9:51 PM   Specimen: BLOOD  Result Value Ref Range Status   Specimen Description   Final    BLOOD LEFT ANTECUBITAL Performed at [facility], [place] [address] [place], [place] [address]    Special Requests   Final    BOTTLES DRAWN AEROBIC AND ANAEROBIC Blood Culture adequate volume Performed at [place] [address] [place], [place] [address]    Culture   Final    NO GROWTH 3 DAYS Performed at [facility], [place] [address] [place], [place] [address]    Report Status PENDING  Incomplete   Blood Culture (routine x 2)     Status: None (Preliminary result)   Collection Time: 01/16/19  9:56 PM   Specimen: BLOOD  Result Value Ref Range Status   Specimen Description   Final    BLOOD RIGHT ANTECUBITAL Performed at [facility]  [address] [place], [place] [address]    Special Requests   Final    BOTTLES DRAWN AEROBIC AND ANAEROBIC Blood Culture adequate volume Performed at [facility] [address] [place], [place] [address]    Culture   Final    NO GROWTH 3 DAYS Performed at [facility], [place] [address], [place], [place] [address]    Report Status PENDING  Incomplete  SARS CORONAVIRUS 2 (TAT 6-24 HRS) Nasopharyngeal Nasopharyngeal Swab     Status: Abnormal   Collection Time: 01/17/19  1:13 AM   Specimen: Nasopharyngeal Swab  Result Value Ref Range Status   SARS Coronavirus 2 POSITIVE (A) NEGATIVE Final    Comment: RESULT CALLED TO, READ BACK BY AND VERIFIED WITH: Henrietta Hoover RN 15:25 01/17/19 ([email or web address]) (NOTE) SARS-CoV-2 target nucleic acids are DETECTED. The SARS-CoV-2 RNA is generally detectable in upper and lower respiratory specimens during the acute phase of infection. Positive results are indicative of active infection with SARS-CoV-2. Clinical  correlation with patient history and other diagnostic information is necessary to determine patient infection status. Positive results do  not rule out bacterial infection or co-infection with other viruses. The expected result is Negative. Fact Sheet for Patients: SugarRoll.be Fact Sheet for Healthcare Providers: https://www.woods-mathews.com/ This test is not yet approved or cleared by the [place] FDA and  has been authorized for detection and/or diagnosis of SARS-CoV-2 by FDA under an Emergency Use Authorization (EUA). This EUA will remain  in effect (meaning this test can be used) fo r the duration of the COVID-19  declaration under Section 564(b)(1) of the Act, 21 U.S.C. section 360bbb-3(b)(1), unless the authorization is terminated or revoked sooner. Performed at [facility], [place] [address], [place], [place] [address]   MRSA PCR Screening     Status: None   Collection Time: 01/17/19  6:24 AM   Specimen: Nasopharyngeal  Result Value Ref Range Status   MRSA by PCR NEGATIVE NEGATIVE Final    Comment:        The GeneXpert MRSA Assay (FDA approved for NASAL specimens only), is one component of a comprehensive MRSA colonization surveillance program. It is not intended to diagnose MRSA infection nor to guide or monitor treatment for MRSA infections. Performed at [facility], [place] [address] [place], [place] [address]      RN Pressure Injury Documentation: Pressure Injury 12/20/18 Coccyx Medial Stage II -  Partial thickness loss of dermis presenting as a shallow open ulcer with a red, pink wound bed without slough. pink, open skin, non-blanchable (Active)  12/20/18 1415  Location: Coccyx  Location Orientation: Medial  Staging: Stage II -  Partial thickness loss of dermis presenting as a shallow open ulcer with a red, pink wound bed without slough.  Wound Description (Comments): pink, open skin, non-blanchable  Present on Admission: Yes    Radiology Studies: Dg Chest Port 1 View  Result Date: 01/19/2019 CLINICAL DATA:  Shortness of breath. EXAM: PORTABLE CHEST 1 VIEW COMPARISON:  01/18/2019 FINDINGS: 0416 hours. Lungs are hyperexpanded. Interstitial markings are diffusely coarsened with chronic features. Patchy airspace disease again noted right mid lung and left base. Potential new subtle airspace disease right upper lobe although superimposition of cardiac lead hinders assessment. The cardiopericardial silhouette is within normal limits for size. Bones are diffusely demineralized. Status post left shoulder replacement. Telemetry leads overlie the chest. IMPRESSION: No  substantial change. Patchy airspace disease right mid lung and left base. Emphysema with chronic interstitial coarsening. Electronically  Signed   By: Misty Stanley M.D.   On: 01/19/2019 06:58   Dg Chest Port 1 View  Result Date: 01/18/2019 CLINICAL DATA:  85 year old female with shortness of breath, COVID pneumonia EXAM: PORTABLE CHEST 1 VIEW COMPARISON:  Prior chest x-ray 01/16/2019 FINDINGS: Developing patchy airspace opacities in the medial right base and right mid lung. These changes are superimposed on a background of chronic bronchitic changes and interstitial prominence. Stable cardiac and mediastinal contours. Atherosclerotic calcifications present in the thoracic aorta. Surgical changes of prior left shoulder arthroplasty. High-riding right humeral head suggests chronic rotator cuff tear. No pleural effusion or pneumothorax. IMPRESSION: Developing patchy airspace opacities in the right mid lung and medial right base consistent with the clinical history of COVID. Hyperinflation and chronic bronchitic changes consistent with the clinical history of COPD. Electronically Signed   By: Jacqulynn Cadet M.D.   On: 01/18/2019 13:20   Scheduled Meds: . dexamethasone (DECADRON) injection  6 mg Intravenous Q0600  . diphenoxylate-atropine  1 tablet Oral TID AC  . enoxaparin (LOVENOX) injection  40 mg Subcutaneous Q0600  . glycopyrrolate  1 mg Oral TID AC  . Ipratropium-Albuterol  1 puff Inhalation BID  . mirabegron ER  50 mg Oral QHS  . multivitamin with minerals  1 tablet Oral Daily  . Ensure Max Protein  11 oz Oral TID PC  . vitamin C  500 mg Oral Daily  . zinc sulfate  220 mg Oral Daily   Continuous Infusions: . remdesivir 100 mg in NS 250 mL 100 mg (01/19/19 1133)    LOS: 2 days   Kerney Elbe, DO [facility] PAGER is on AMION  If 7PM-7AM, please contact night-coverage [email or web address]

## 2019-01-20 ENCOUNTER — Inpatient Hospital Stay (HOSPITAL_COMMUNITY): Payer: Medicare Other

## 2019-01-20 ENCOUNTER — Encounter (HOSPITAL_COMMUNITY): Payer: Self-pay

## 2019-01-20 LAB — COMPREHENSIVE METABOLIC PANEL
ALT: 23 U/L (ref 0–44)
AST: 29 U/L (ref 15–41)
Albumin: 2.5 g/dL — ABNORMAL LOW (ref 3.5–5.0)
Alkaline Phosphatase: 82 U/L (ref 38–126)
Anion gap: 10 (ref 5–15)
BUN: 43 mg/dL — ABNORMAL HIGH (ref 8–23)
CO2: 26 mmol/L (ref 22–32)
Calcium: 8.6 mg/dL — ABNORMAL LOW (ref 8.9–10.3)
Chloride: 100 mmol/L (ref 98–111)
Creatinine, Ser: 1.09 mg/dL — ABNORMAL HIGH (ref 0.44–1.00)
GFR calc Af Amer: 53 mL/min — ABNORMAL LOW (ref >60)
GFR calc non Af Amer: 46 mL/min — ABNORMAL LOW (ref >60)
Glucose, Bld: 126 mg/dL — ABNORMAL HIGH (ref 70–99)
Potassium: 4.5 mmol/L (ref 3.5–5.1)
Sodium: 136 mmol/L (ref 135–145)
Total Bilirubin: 0.2 mg/dL — ABNORMAL LOW (ref 0.3–1.2)
Total Protein: 5.7 g/dL — ABNORMAL LOW (ref 6.5–8.1)

## 2019-01-20 LAB — CBC WITH DIFFERENTIAL/PLATELET
Abs Immature Granulocytes: 0.23 10*3/uL — ABNORMAL HIGH (ref 0.00–0.07)
Basophils Absolute: 0 10*3/uL (ref 0.0–0.1)
Basophils Relative: 1 %
Eosinophils Absolute: 0 10*3/uL (ref 0.0–0.5)
Eosinophils Relative: 0 %
HCT: 34.1 % — ABNORMAL LOW (ref 36.0–46.0)
Hemoglobin: 10.8 g/dL — ABNORMAL LOW (ref 12.0–15.0)
Immature Granulocytes: 5 %
Lymphocytes Relative: 12 %
Lymphs Abs: 0.5 10*3/uL — ABNORMAL LOW (ref 0.7–4.0)
MCH: 29.8 pg (ref 26.0–34.0)
MCHC: 31.7 g/dL (ref 30.0–36.0)
MCV: 93.9 fL (ref 80.0–100.0)
Monocytes Absolute: 0.5 10*3/uL (ref 0.1–1.0)
Monocytes Relative: 10 %
Neutro Abs: 3.4 10*3/uL (ref 1.7–7.7)
Neutrophils Relative %: 72 %
Platelets: 245 10*3/uL (ref 150–400)
RBC: 3.63 MIL/uL — ABNORMAL LOW (ref 3.87–5.11)
RDW: 13.4 % (ref 11.5–15.5)
WBC: 4.6 10*3/uL (ref 4.0–10.5)
nRBC: 0 % (ref 0.0–0.2)

## 2019-01-20 LAB — D-DIMER, QUANTITATIVE: D-Dimer, Quant: 0.81 ug/mL-FEU — ABNORMAL HIGH (ref 0.00–0.50)

## 2019-01-20 LAB — C-REACTIVE PROTEIN: CRP: 1.5 mg/dL — ABNORMAL HIGH (ref <1.0)

## 2019-01-20 LAB — IRON AND TIBC
Iron: 47 ug/dL (ref 28–170)
Saturation Ratios: 20 % (ref 10.4–31.8)
TIBC: 237 ug/dL — ABNORMAL LOW (ref 250–450)
UIBC: 190 ug/dL

## 2019-01-20 LAB — RETICULOCYTES
Immature Retic Fract: 14 % (ref 2.3–15.9)
RBC.: 3.63 MIL/uL — ABNORMAL LOW (ref 3.87–5.11)
Retic Count, Absolute: 41.7 10*3/uL (ref 19.0–186.0)
Retic Ct Pct: 1.2 % (ref 0.4–3.1)

## 2019-01-20 LAB — PHOSPHORUS: Phosphorus: 3.8 mg/dL (ref 2.5–4.6)

## 2019-01-20 LAB — SEDIMENTATION RATE: Sed Rate: 43 mm/hr — ABNORMAL HIGH (ref 0–22)

## 2019-01-20 LAB — MAGNESIUM: Magnesium: 2.1 mg/dL (ref 1.7–2.4)

## 2019-01-20 LAB — FIBRINOGEN: Fibrinogen: 463 mg/dL (ref 210–475)

## 2019-01-20 LAB — FOLATE: Folate: 29.9 ng/mL (ref >5.9)

## 2019-01-20 LAB — FERRITIN: Ferritin: 152 ng/mL (ref 11–307)

## 2019-01-20 LAB — VITAMIN B12: Vitamin B-12: 497 pg/mL (ref 180–914)

## 2019-01-20 LAB — LACTATE DEHYDROGENASE: LDH: 160 U/L (ref 98–192)

## 2019-01-20 MED ORDER — SODIUM CHLORIDE 0.9 % IV SOLN
INTRAVENOUS | Status: AC
  Administered 2019-01-20: 09:00:00 via INTRAVENOUS

## 2019-01-20 NOTE — Evaluation (Signed)
Physical Therapy Evaluation Patient Details Name: [name] MRN: [id number] DOB: [birth year] Today's Date: 01/20/2019   History of Present Illness  85 year old Caucasian female from [facility] facility with a past medical history significant for but not limited to anemia, arthritis, chronic diarrhea from her prior Colon Surgery, COPD, GERD, hypertension and other comorbidities who presented to the hospital after being tested positive for COVID-19 disease  Clinical Impression  On eval, pt was Min assist for mobility. She walked ~45 feet around her room using a RW for support. Pt tolerated activity well. O2 >90% on RA during session. Discussed d/c plan. Pt has recently began staying at [facility] after a SNF rehab stay. She was there for only 1 day before being sent to [facility] 2* positive Covid test. During session today, pt stated she did not feel she could manage at home alone at this time. Recommend return to ALF with HHPT f/u if/when at all possible.     Follow Up Recommendations Home health PT(at ALF if able to return.)    Equipment Recommendations  None recommended by PT    Recommendations for Other Services       Precautions / Restrictions Precautions Precautions: Fall Restrictions Weight Bearing Restrictions: No      Mobility  Bed Mobility Overal bed mobility: Needs Assistance Bed Mobility: Supine to Sit     Supine to sit: HOB elevated;Supervision     General bed mobility comments: for safety. increased time.  Transfers Overall transfer level: Needs assistance Equipment used: Rolling walker (2 wheeled) Transfers: Sit to/from Stand Sit to Stand: Min assist         General transfer comment: Small amount of assist to rise, steady.  Ambulation/Gait Ambulation/Gait assistance: Min guard Gait Distance (Feet): 45 Feet Assistive device: Rolling walker (2 wheeled) Gait Pattern/deviations: Step-through pattern;Trunk flexed     General Gait Details: close  guard for safety. pt tolerated distance well. O2 > 90% on RA.  Stairs            Wheelchair Mobility    Modified Rankin (Stroke Patients Only)       Balance Overall balance assessment: Needs assistance Sitting-balance support: No upper extremity supported;Feet supported Sitting balance-Leahy Scale: Fair     Standing balance support: Bilateral upper extremity supported Standing balance-Leahy Scale: Poor Standing balance comment: Reliant on UE support                             Pertinent Vitals/Pain Pain Assessment: Faces Faces Pain Scale: No hurt Pain Intervention(s): Monitored during session    Home Living Family/patient expects to be discharged to:: Assisted living               Home Equipment: Kasandra Knudsen - single point Additional Comments: Was at [facility] for one day after rehab at [facility]. Was planning to stay at [facility] in preparation for home.    Prior Function Level of Independence: Independent with assistive device(s)         Comments: Prior to fall, pt was independent with ADLs, IADLs, and driving. At SNF for rehab after fall [facility] - requests not to return to [place]). Pt was using RW and walking to bathroom at SNF. Requiring assistance for LB ADLs.     Hand Dominance   Dominant Hand: Right    Extremity/Trunk Assessment   Upper Extremity Assessment Upper Extremity Assessment: Defer to OT evaluation RUE Deficits / Details:  Athritis at fingers. Poor FM skills RUE Coordination: decreased fine motor LUE Deficits / Details: Athritis at fingers. Poor FM skills LUE Coordination: decreased fine motor    Lower Extremity Assessment Lower Extremity Assessment: Generalized weakness    Cervical / Trunk Assessment Cervical / Trunk Assessment: Kyphotic  Communication   Communication: No difficulties  Cognition Arousal/Alertness: Awake/alert Behavior During Therapy: WFL for tasks  assessed/performed Overall Cognitive Status: Within Functional Limits for tasks assessed                                        General Comments General comments (skin integrity, edema, etc.): Spo2 >90% on RA    Exercises     Assessment/Plan    PT Assessment Patient needs continued PT services  PT Problem List Decreased strength;Decreased mobility;Decreased activity tolerance;Decreased balance;Decreased knowledge of use of DME       PT Treatment Interventions DME instruction;Gait training;Therapeutic exercise;Therapeutic activities;Patient/family education;Balance training;Functional mobility training    PT Goals (Current goals can be found in the Care Plan section)  Acute Rehab PT Goals Patient Stated Goal: "Get back to living on my own at home" PT Goal Formulation: With patient Time For Goal Achievement: 02/03/19 Potential to Achieve Goals: Good    Frequency Min 3X/week   Barriers to discharge        Co-evaluation   Reason for Co-Treatment: For patient/therapist safety;To address functional/ADL transfers   OT goals addressed during session: ADL's and self-care       AM-PAC PT "6 Clicks" Mobility  Outcome Measure Help needed turning from your back to your side while in a flat bed without using bedrails?: A Little Help needed moving from lying on your back to sitting on the side of a flat bed without using bedrails?: A Little Help needed moving to and from a bed to a chair (including a wheelchair)?: A Little Help needed standing up from a chair using your arms (e.g., wheelchair or bedside chair)?: A Little Help needed to walk in hospital room?: A Little Help needed climbing 3-5 steps with a railing? : A Lot 6 Click Score: 17    End of Session   Activity Tolerance: Patient tolerated treatment well Patient left: in chair;with call bell/phone within reach;with chair alarm set   PT Visit Diagnosis: Unsteadiness on feet (R26.81);Muscle weakness  (generalized) (M62.81);History of falling (Z91.81)    Time: 1120-1202 PT Time Calculation (min) (ACUTE ONLY): 42 min   Charges:   PT Evaluation $PT Eval Moderate Complexity: [place], PT Acute Rehabilitation Services Pager: [phone] Office: [phone]

## 2019-01-20 NOTE — TOC Progression Note (Signed)
Transition of Care [facility]) - Progression Note    Patient Details  Name: [name] MRN: [id number] Date of Birth: 11/16/1930  [facility] CM/SW Contact  Purcell Mouton, RN Phone Number: 01/20/2019, 1:50 PM  Clinical Narrative:     Pt from [facility]. Waiting to speak with pt's son concerning discharge plans.  Expected Discharge Plan: Assisted Living    Expected Discharge Plan and Services Expected Discharge Plan: Assisted Living   Discharge Planning Services: CM Consult   Living arrangements for the past 2 months: Assisted Living Facility                                       Social Determinants of Health (SDOH) Interventions    Readmission Risk Interventions No flowsheet data found.

## 2019-01-20 NOTE — Care Management Important Message (Signed)
Important Message  Patient Details IM Letter given to [name] RN to present to the Patient Name: CAMIA SCHAEDLER MRN: [id number] Date of Birth: 05/24/1930   Medicare Important Message Given:  Yes     Kerin Salen 01/20/2019, 11:21 AM

## 2019-01-20 NOTE — Progress Notes (Signed)
PROGRESS NOTE    [name]  OVZ:[id number] DOB: Jun 22, 1930 DOA: 01/16/2019 PCP: Lajean Manes, MD   Brief Narrative:  The patient is an 85 year old Caucasian female from [place] facility with a past medical history significant for but not limited to anemia, arthritis, chronic diarrhea from her prior Colon Surgery, COPD, GERD, hypertension and other comorbidities who presented to the hospital after being tested positive for COVID-19 disease.  Patient tested positive for COVID-19 at her facility.  She was sent by her son to be checked out and her only complaint was sneezing.  In the ED she is found to be febrile with oxygen saturation intermittently dropping to the mid to high 80s and she was placed on supplemental oxygen via nasal cannula 2 L with she is off of it now.  Initially she was not neutropenic but repeat CBC this morning showed neutropenia and also showed a hemoglobin of 10.9.  She has been given steroids and remdesivir and has been weaned off of oxygen and is doing better.  Physical therapy and Occupational Therapy saw the patient and OT reccomending SNF but PT recommending Home Health   Assessment & Plan:   Principal Problem:   Pneumonia due to COVID-19 virus Active Problems:   Sepsis ([place])   Acute respiratory failure with hypoxia ([place])   Pulmonary nodule   Thoracic aortic aneurysm ([facility])  Acute Hypoxic Respiratory Failure secondary to COVID-19 Multifocal pneumonia and SIRS from COVID-19 in a patient with underlying COPD, improving -Tested positive for COVID-19 11/19. -Oxygen saturation intermittently dropping to the mid to high 80s and was patient placed on 2 L supplemental oxygen and now weaned off and on Room Air; -She ambulated with PT and her O2 was >90% -Temperature 102.2 F initially andSlightly tachycardic and tachypneic. No leukocytosis, does have lymphopenia.  -Lactic acid 2.9 -> 0.7. Procalcitonin <0.10. -Inflammatory markers elevated but trending down  now: Recent Labs    01/18/19 0524 01/19/19 0325 01/20/19 0424  DDIMER 1.45* 0.96* 0.81*  FERRITIN 149 172 152  LDH 144 148 160  CRP 2.4*  2.5* 1.5*  1.5* 1.5*  -Fibrinogen 582 -> 541 -> 511 -> 499 -> 463 -ESR went from 52 -> 42 -> 57 -> 43 Lab Results  Component Value Date   SARSCOV2NAA POSITIVE (A) 01/17/2019   [place] NEGATIVE 12/20/2018  -CT angiogram negative for PE but showing evidence of multifocal pneumonia and scattered areas of atelectasis. -Repeat CXR this AM showed "No substantial change. Patchy airspace disease right mid lung and left base. Emphysema with chronic interstitial coarsening." -C/w Decadron 6 mg Daily  -C/w Remdesivir for 5 Days and has 2 more days -Vitamin C and Zinc -C/w Antitussives with Robitussin DM -Tylenol as needed -Incentive spirometry -Albuterol prn and Combivent scheduled Inhaler Treatments  -Trend Daily Inflammatory Markers as above -Continuous pulse ox -Supplemental oxygen to keep oxygen saturation above 90% -Blood culture x2 showed NGTD at 4 Days -Maintain euvolemia to net negative volume status -Repeat CXR in AM   Hypokalemia -Potassium was 3.3 and now is 4.5 -Continue to monitor and Replete as Necessary -Rpeat CMP in a.m.  Thyroid Mass/Multinodular Goiter -CT showingdominant mass arising from right lobe of thyroid measuring 3.3 x 2.5 cm. -Thyroid ultrasound was ordered but will cancel have the patient do this in outpatient setting -Check TSH and was 1.417, free T4 level was 1.02  Pulmonary Nodules -CT showing nodular opacities in the right middle lobe, largest measuring 7 mm.Patient is a former smoker. -Repeat chest CT in  32month  Thoracic Aortic Aneurysm -CT showingascending thoracic aorta measures 4.1 x 4.1 cm. No dissection evident. There is aortic atherosclerosis as well as foci of great vessel and coronary artery calcification. -Patient will need annual imaging follow-up by CTA or MRI  Chronic  Diarrhea -Continue with home Lomotil  Normocytic Anemia Patient's hemoglobin/hematocrit went from 11.9/38.1 -> 10.9/34.5 -> 10.4/33.0 -> 11.4/36.0 -> 10.8/34.1 -Checked Anemia Panel showed an iron level of 47, U IBC of 190, TIBC of 237, saturation ratios of 20%, ferritin level 152, folate level 29.9, and vitamin B12 level 497 -Continue to monitor for signs and symptoms of bleeding; currently no overt bleeding noted -Repeat CBC in a.m.  Hypophosphatemia -Patient's Phos Level was 3.8 this AM -Continue to Monitor and Replete as Necessary -Repeat Phos Level in AM   Elevated BUN -In the setting of steroid demargination as BUN was 43 -Continue to monitor and trend repeat CMP in AM   AKI  -Mild. Patient's BUN/creatinine went from 37/0.82 and is now 43/1.09 Avoid nephrotoxic medications, contrast dyes as well as hypotension and renally dose medications -Started the patient on gentle normal saline at a rate of 75 mL's per hour for 12 hours -Continue monitor and trend renal function -Repeat CMP in a.m.  DVT prophylaxis: Enoxaparin 40 mg sq q24h Code Status: FULL CODE Family Communication: No family present but updated son yesterday  [facility]   Consultants:   None   Procedures: None  Antimicrobials:  Anti-infectives (From admission, onward)   Start     Dose/Rate Route Frequency Ordered Stop   01/18/19 1000  remdesivir 100 mg in sodium chloride 0.9 % 250 mL IVPB     100 mg 500 mL/hr over 30 Minutes Intravenous Every 24 hours 01/17/19 0320 01/22/19 0959   01/17/19 0400  remdesivir 200 mg in sodium chloride 0.9 % 250 mL IVPB     200 mg 500 mL/hr over 30 Minutes Intravenous Once 01/17/19 0320 01/17/19 0710     Subjective: Seen and examined at bedside states he had a better night last night.  Wanting to get out of bed and work with therapy.  No chest pain, lightheadedness or dizziness.  No other concerns or complaints at  this time.   Objective: Vitals:   01/19/19 1134 01/19/19 2230 01/20/19 0611 01/20/19 1323  BP: (!) 144/84 138/90 (!) 166/97 (!) 163/88  Pulse: 89 92 81 75  Resp: _0 Temp: 98.9 F (37.2 C) 98.8 F (37.1 C) 97.7 F (36.5 C) 97.6 F (36.4 C)  TempSrc: Oral Oral Oral Oral  SpO2: 97% 94% 94% 95%  Weight:      Height:        Intake/Output Summary (Last 24 hours) at 01/20/2019 1527 Last data filed at 01/20/2019 1322 Gross per 24 hour  Intake 850.45 ml  Output 2250 ml  Net -1399.55 ml   Filed Weights   01/17/19 0644  Weight: 51.3 kg   Examination: Physical Exam:  Constitutional: Thin Caucasian female in NAD and appears calm Eyes: Lids and conjunctivae normal, sclerae anicteric  ENMT: External Ears, Nose appear normal. Grossly normal hearing. Mucous membranes are moist.  Neck: Appears normal, supple, no cervical masses, normal ROM, no appreciable thyromegaly; no JVD Respiratory: Diminished to auscultation bilaterally, no wheezing, rales, rhonchi or crackles. Normal respiratory effort and patient is not tachypenic. No accessory muscle use. Unlabored breathing and not wearing supplemental O2 via [place] Cardiovascular: RRR, no murmurs /  rubs / gallops. S1 and S2 auscultated. No extremity edema noted today Abdomen: Soft, non-tender, non-distended. Bowel sounds positive x4.  GU: Deferred. Musculoskeletal: No clubbing / cyanosis of digits/nails. No joint deformity upper and lower extremities. Skin: No rashes, lesions, ulcers on a limited skin evaluation.  Neurologic: CN 2-12 grossly intact with no focal deficits. Romberg sign and cerebellar reflexes not assessed.  Psychiatric: Normal judgment and insight. Alert and oriented x 3. Normal mood and appropriate affect.     Data Reviewed: I have personally reviewed following labs and imaging studies  CBC: Recent Labs  Lab 01/16/19 2151 01/17/19 0451 01/18/19 0524 01/19/19 0325 01/20/19 0424  WBC 4.2 3.8* 4.7 5.6 4.6   NEUTROABS 3.1 2.3 3.3 4.0 3.4  HGB 11.9* 10.9* 10.4* 11.4* 10.8*  HCT 38.1 34.5* 33.0* 36.0 34.1*  MCV 96.9 96.6 95.9 95.0 93.9  PLT 187 177 184 228 767   Basic Metabolic Panel: Recent Labs  Lab 01/16/19 2151 01/17/19 0248 01/17/19 0451 01/17/19 0901 01/18/19 0524 01/19/19 0325 01/20/19 0424  NA 135  --  136  --  136 135 136  K 3.3*  --  3.5  --  5.1 4.9 4.5  CL 97*  --  99  --  103 98 100  CO2 24  --  26  --  _0 GLUCOSE 139*  --  105*  --  102* 125* 126*  BUN 24*  --  21  --  32* 37* 43*  CREATININE 1.03*  --  0.83  --  0.90 0.82 1.09*  CALCIUM 8.6*  --  8.6*  --  9.0 8.9 8.6*  MG  --  2.0  --  1.9 1.9 2.0 2.1  PHOS  --   --   --  2.5 2.4* 2.7 3.8   GFR: Estimated Creatinine Clearance: 30 mL/min (A) (by C-G formula based on SCr of 1.09 mg/dL (H)). Liver Function Tests: Recent Labs  Lab 01/16/19 2151 01/18/19 0524 01/19/19 0325 01/20/19 0424  AST 35 _1 ALT _2 ALKPHOS 89 70 77 82  BILITOT 0.3 0.3 0.2* 0.2*  PROT 7.2 5.9* 6.1* 5.7*  ALBUMIN 3.4* 2.7* 2.6* 2.5*   No results for input(s): LIPASE, AMYLASE in the last 168 hours. No results for input(s): AMMONIA in the last 168 hours. Coagulation Profile: No results for input(s): INR, PROTIME in the last 168 hours. Cardiac Enzymes: No results for input(s): CKTOTAL, CKMB, CKMBINDEX, TROPONINI in the last 168 hours. BNP (last 3 results) No results for input(s): PROBNP in the last 8760 hours. HbA1C: No results for input(s): HGBA1C in the last 72 hours. CBG: No results for input(s): GLUCAP in the last 168 hours. Lipid Profile: No results for input(s): CHOL, HDL, LDLCALC, TRIG, CHOLHDL, LDLDIRECT in the last 72 hours. Thyroid Function Tests: No results for input(s): TSH, T4TOTAL, FREET4, T3FREE, THYROIDAB in the last 72 hours. Anemia Panel: Recent Labs    01/19/19 0325 01/20/19 0424  VITAMINB12  --  497  FOLATE  --  29.9  FERRITIN 172 152  TIBC  --  237*  IRON  --  47  RETICCTPCT  --   1.2   Sepsis Labs: Recent Labs  Lab 01/16/19 2151 01/16/19 2351  PROCALCITON <0.10  --   LATICACIDVEN 2.9* 0.7    Recent Results (from the past 240 hour(s))  Blood Culture (routine x 2)     Status: None (Preliminary result)   Collection Time: 01/16/19  9:51 PM  Specimen: BLOOD  Result Value Ref Range Status   Specimen Description   Final    BLOOD LEFT ANTECUBITAL Performed at [facility] [address], [place], [place] [address]    Special Requests   Final    BOTTLES DRAWN AEROBIC AND ANAEROBIC Blood Culture adequate volume Performed at [facility] [address], [place], [place] [address]    Culture   Final    NO GROWTH 4 DAYS Performed at [facility], [place] [address] [place], [place] [address]    Report Status PENDING  Incomplete  Blood Culture (routine x 2)     Status: None (Preliminary result)   Collection Time: 01/16/19  9:56 PM   Specimen: BLOOD  Result Value Ref Range Status   Specimen Description   Final    BLOOD RIGHT ANTECUBITAL Performed at [facility] [address], [place], [place] [address]    Special Requests   Final    BOTTLES DRAWN AEROBIC AND ANAEROBIC Blood Culture adequate volume Performed at [facility] [address] [place], [place] [address]    Culture   Final    NO GROWTH 4 DAYS Performed at [facility], [place] [address] [place], [place] [address]    Report Status PENDING  Incomplete  SARS CORONAVIRUS 2 (TAT 6-24 HRS) Nasopharyngeal Nasopharyngeal Swab     Status: Abnormal   Collection Time: 01/17/19  1:13 AM   Specimen: Nasopharyngeal Swab  Result Value Ref Range Status   SARS Coronavirus 2 POSITIVE (A) NEGATIVE Final    Comment: RESULT CALLED TO, READ BACK BY AND VERIFIED WITH: Henrietta Hoover RN 15:25 01/17/19 ([email or web address]) (NOTE) SARS-CoV-2 target nucleic acids are DETECTED. The SARS-CoV-2 RNA is generally detectable in upper and  lower respiratory specimens during the acute phase of infection. Positive results are indicative of active infection with SARS-CoV-2. Clinical  correlation with patient history and other diagnostic information is necessary to determine patient infection status. Positive results do  not rule out bacterial infection or co-infection with other viruses. The expected result is Negative. Fact Sheet for Patients: SugarRoll.be Fact Sheet for Healthcare Providers: https://www.woods-mathews.com/ This test is not yet approved or cleared by the [place] FDA and  has been authorized for detection and/or diagnosis of SARS-CoV-2 by FDA under an Emergency Use Authorization (EUA). This EUA will remain  in effect (meaning this test can be used) fo r the duration of the COVID-19 declaration under Section 564(b)(1) of the Act, 21 U.S.C. section 360bbb-3(b)(1), unless the authorization is terminated or revoked sooner. Performed at [facility], [place] [address], [place], [place] [address]   MRSA PCR Screening     Status: None   Collection Time: 01/17/19  6:24 AM   Specimen: Nasopharyngeal  Result Value Ref Range Status   MRSA by PCR NEGATIVE NEGATIVE Final    Comment:        The GeneXpert MRSA Assay (FDA approved for NASAL specimens only), is one component of a comprehensive MRSA colonization surveillance program. It is not intended to diagnose MRSA infection nor to guide or monitor treatment for MRSA infections. Performed at [facility], [place] [address], [place],  [address]      RN Pressure Injury Documentation: Pressure Injury 12/20/18 Coccyx Medial Stage II -  Partial thickness loss of dermis presenting as a shallow open ulcer with a red, pink wound bed without slough. pink, open skin, non-blanchable (Active)  12/20/18 1415  Location: Coccyx  Location Orientation: Medial  Staging: Stage II -  Partial thickness loss  of dermis presenting as a shallow open ulcer with a red, pink wound bed without slough.  Wound Description (Comments): pink, open skin, non-blanchable  Present on Admission: Yes    Radiology Studies: Dg Chest Port 1 View  Result Date: 01/19/2019 CLINICAL DATA:  Shortness of breath. EXAM: PORTABLE CHEST 1 VIEW COMPARISON:  01/18/2019 FINDINGS: 0416 hours. Lungs are hyperexpanded. Interstitial markings are diffusely coarsened with chronic features. Patchy airspace disease again noted right mid lung and left base. Potential new subtle airspace disease right upper lobe although superimposition of cardiac lead hinders assessment. The cardiopericardial silhouette is within normal limits for size. Bones are diffusely demineralized. Status post left shoulder replacement. Telemetry leads overlie the chest. IMPRESSION: No substantial change. Patchy airspace disease right mid lung and left base. Emphysema with chronic interstitial coarsening. Electronically Signed   By: Misty Stanley M.D.   On: 01/19/2019 06:58   Scheduled Meds: . dexamethasone (DECADRON) injection  6 mg Intravenous Q0600  . diphenoxylate-atropine  1 tablet Oral TID AC  . enoxaparin (LOVENOX) injection  40 mg Subcutaneous Q0600  . glycopyrrolate  1 mg Oral TID AC  . Ipratropium-Albuterol  1 puff Inhalation BID  . mirabegron ER  50 mg Oral QHS  . multivitamin with minerals  1 tablet Oral Daily  . Ensure Max Protein  11 oz Oral TID PC  . vitamin C  500 mg Oral Daily  . zinc sulfate  220 mg Oral Daily   Continuous Infusions: . sodium chloride 75 mL/hr at 01/20/19 0915  . remdesivir 100 mg in NS 250 mL 100 mg (01/20/19 1055)    LOS: 3 days   Kerney Elbe, DO [facility] PAGER is on [place]  If 7PM-7AM, please contact night-coverage [email or web address]

## 2019-01-20 NOTE — Evaluation (Signed)
Occupational Therapy Evaluation Patient Details Name: [name] MRN: [id number] DOB: 24-Sep-1930 Today's Date: 01/20/2019    History of Present Illness 85 year old Caucasian female from [place] facility with a past medical history significant for but not limited to anemia, arthritis, chronic diarrhea from her prior Colon Surgery, COPD, GERD, hypertension and other comorbidities who presented to the hospital after being tested positive for COVID-19 disease   Clinical Impression   PTA, pt was staying at [facility] (for one day) in preparation for home after recent rehab stay at [facility]. Pt was performing short distance mobility with staff and RW and requiring assistance for LB ADLs; prior to SNF admission pt was living alone and was completely independent. Pt would benefit from further acute OT to facilitate safe dc. Recommend dc to SNF for further OT to optimize safety, independence with ADLs, and return to PLOF.      Follow Up Recommendations  SNF;Supervision/Assistance - 24 hour    Equipment Recommendations  Other (comment)(Defer to next venue)    Recommendations for Other Services PT consult     Precautions / Restrictions Precautions Precautions: Fall      Mobility Bed Mobility Overal bed mobility: Needs Assistance Bed Mobility: Supine to Sit     Supine to sit: Min guard;HOB elevated     General bed mobility comments: Min Guard A for safety  Transfers Overall transfer level: Needs assistance Equipment used: Rolling walker (2 wheeled) Transfers: Sit to/from Stand Sit to Stand: Min assist         General transfer comment: Min A for power up and to steady in standing.     Balance Overall balance assessment: Needs assistance Sitting-balance support: No upper extremity supported;Feet supported Sitting balance-Leahy Scale: Fair     Standing balance support: Bilateral upper extremity supported;During functional activity Standing balance-Leahy  Scale: Poor Standing balance comment: Reliant on UE support                           ADL either performed or assessed with clinical judgement   ADL Overall ADL's : Needs assistance/impaired Eating/Feeding: Minimal assistance;Sitting Eating/Feeding Details (indicate cue type and reason): Min A for opening containers due to poor FM skills Grooming: Min guard;Standing   Upper Body Bathing: Set up;Supervision/ safety;Sitting   Lower Body Bathing: Moderate assistance;Sit to/from stand   Upper Body Dressing : Set up;Supervision/safety;Sitting   Lower Body Dressing: Maximal assistance;Sit to/from stand Lower Body Dressing Details (indicate cue type and reason): Max A for donning socks while sitting at EOB. Pt attempting to bend forward to don socks but limited ROM and strength. Pt would benefit from education on AE for LB ADLs Toilet Transfer: Minimal assistance;Comfort height toilet;RW(simulated to recliner)           Functional mobility during ADLs: Minimal assistance;Rolling walker General ADL Comments: Pt presenting with decreased strength and balance. Very eager to participate in therapy     Vision Baseline Vision/History: Wears glasses Patient Visual Report: No change from baseline       Perception     Praxis      Pertinent Vitals/Pain Pain Assessment: Faces Faces Pain Scale: No hurt Pain Intervention(s): Monitored during session     Hand Dominance Right   Extremity/Trunk Assessment Upper Extremity Assessment Upper Extremity Assessment: Generalized weakness;RUE deficits/detail;LUE deficits/detail RUE Deficits / Details: Athritis at fingers. Poor FM skills RUE Coordination: decreased fine motor LUE Deficits / Details: Athritis at fingers. Poor FM skills LUE  Coordination: decreased fine motor   Lower Extremity Assessment Lower Extremity Assessment: Defer to PT evaluation   Cervical / Trunk Assessment Cervical / Trunk Assessment: Kyphotic    Communication Communication Communication: No difficulties   Cognition Arousal/Alertness: Awake/alert Behavior During Therapy: WFL for tasks assessed/performed Overall Cognitive Status: Within Functional Limits for tasks assessed                                     General Comments  Spo2 >90% on RA    Exercises     Shoulder Instructions      Home Living Family/patient expects to be discharged to:: Assisted living                             Home Equipment: Kasandra Knudsen - single point   Additional Comments: Was at [facility] for one day after rehab at [facility]. Was planning to stay at [facility] in preparation for home.      Prior Functioning/Environment Level of Independence: Independent        Comments: Prior to fall, pt was independent with ADLs, IADLs, and driving. At SNF for rehab after fall [facility] - requests not to return to [place]). Pt was using RW and walking to bathroom at SNF. Requiring assistance for LB ADLs.        OT Problem List: Decreased strength;Decreased range of motion;Decreased activity tolerance;Impaired balance (sitting and/or standing);Decreased knowledge of use of DME or AE;Decreased knowledge of precautions;Decreased safety awareness      OT Treatment/Interventions: Self-care/ADL training;Therapeutic exercise;Energy conservation;DME and/or AE instruction;Therapeutic activities;Patient/family education    OT Goals(Current goals can be found in the care plan section) Acute Rehab OT Goals Patient Stated Goal: "Get back to living on my own at home" OT Goal Formulation: With patient Time For Goal Achievement: 02/03/19 Potential to Achieve Goals: Good  OT Frequency: Min 2X/week   Barriers to D/C:            Co-evaluation PT/OT/SLP Co-Evaluation/Treatment: Yes Reason for Co-Treatment: For patient/therapist safety;To address functional/ADL transfers   OT goals addressed during session: ADL's  and self-care      AM-PAC OT "6 Clicks" Daily Activity     Outcome Measure Help from another person eating meals?: None Help from another person taking care of personal grooming?: A Little Help from another person toileting, which includes using toliet, bedpan, or urinal?: A Little Help from another person bathing (including washing, rinsing, drying)?: A Lot Help from another person to put on and taking off regular upper body clothing?: A Little Help from another person to put on and taking off regular lower body clothing?: A Lot 6 Click Score: 17   End of Session Equipment Utilized During Treatment: Rolling walker Nurse Communication: Mobility status  Activity Tolerance: Patient tolerated treatment well Patient left: in chair;with call bell/phone within reach;with chair alarm set  OT Visit Diagnosis: Unsteadiness on feet (R26.81);Other abnormalities of gait and mobility (R26.89);Muscle weakness (generalized) (M62.81);History of falling (Z91.81)                Time: [id number] OT Time Calculation (min): 36 min Charges:  OT General Charges $OT Visit: 1 Visit OT Evaluation $OT Eval Moderate Complexity: [place], OTR/L Acute Rehab Pager: [phone] Office: [place] 01/20/2019, 1:11 PM

## 2019-01-21 ENCOUNTER — Encounter (HOSPITAL_COMMUNITY): Payer: Self-pay

## 2019-01-21 LAB — COMPREHENSIVE METABOLIC PANEL
ALT: 31 U/L (ref 0–44)
AST: 32 U/L (ref 15–41)
Albumin: 2.6 g/dL — ABNORMAL LOW (ref 3.5–5.0)
Alkaline Phosphatase: 77 U/L (ref 38–126)
Anion gap: 10 (ref 5–15)
BUN: 46 mg/dL — ABNORMAL HIGH (ref 8–23)
CO2: 25 mmol/L (ref 22–32)
Calcium: 8.7 mg/dL — ABNORMAL LOW (ref 8.9–10.3)
Chloride: 99 mmol/L (ref 98–111)
Creatinine, Ser: 0.88 mg/dL (ref 0.44–1.00)
GFR calc Af Amer: >60 mL/min (ref >60)
GFR calc non Af Amer: 59 mL/min — ABNORMAL LOW (ref >60)
Glucose, Bld: 112 mg/dL — ABNORMAL HIGH (ref 70–99)
Potassium: 4.5 mmol/L (ref 3.5–5.1)
Sodium: 134 mmol/L — ABNORMAL LOW (ref 135–145)
Total Bilirubin: 0.3 mg/dL (ref 0.3–1.2)
Total Protein: 5.7 g/dL — ABNORMAL LOW (ref 6.5–8.1)

## 2019-01-21 LAB — CBC WITH DIFFERENTIAL/PLATELET
Abs Immature Granulocytes: 0.41 10*3/uL — ABNORMAL HIGH (ref 0.00–0.07)
Basophils Absolute: 0.1 10*3/uL (ref 0.0–0.1)
Basophils Relative: 1 %
Eosinophils Absolute: 0 10*3/uL (ref 0.0–0.5)
Eosinophils Relative: 0 %
HCT: 35.9 % — ABNORMAL LOW (ref 36.0–46.0)
Hemoglobin: 11.3 g/dL — ABNORMAL LOW (ref 12.0–15.0)
Immature Granulocytes: 5 %
Lymphocytes Relative: 12 %
Lymphs Abs: 1 10*3/uL (ref 0.7–4.0)
MCH: 30 pg (ref 26.0–34.0)
MCHC: 31.5 g/dL (ref 30.0–36.0)
MCV: 95.2 fL (ref 80.0–100.0)
Monocytes Absolute: 0.8 10*3/uL (ref 0.1–1.0)
Monocytes Relative: 10 %
Neutro Abs: 5.6 10*3/uL (ref 1.7–7.7)
Neutrophils Relative %: 72 %
Platelets: 258 10*3/uL (ref 150–400)
RBC: 3.77 MIL/uL — ABNORMAL LOW (ref 3.87–5.11)
RDW: 13.3 % (ref 11.5–15.5)
WBC: 7.8 10*3/uL (ref 4.0–10.5)
nRBC: 0 % (ref 0.0–0.2)

## 2019-01-21 LAB — CULTURE, BLOOD (ROUTINE X 2)
Culture: NO GROWTH
Culture: NO GROWTH
Special Requests: ADEQUATE
Special Requests: ADEQUATE

## 2019-01-21 LAB — FIBRINOGEN: Fibrinogen: 454 mg/dL (ref 210–475)

## 2019-01-21 LAB — GLUCOSE, CAPILLARY
Glucose-Capillary: 145 mg/dL — ABNORMAL HIGH (ref 70–99)
Glucose-Capillary: 117 mg/dL — ABNORMAL HIGH (ref 70–99)

## 2019-01-21 LAB — LACTATE DEHYDROGENASE: LDH: 163 U/L (ref 98–192)

## 2019-01-21 LAB — D-DIMER, QUANTITATIVE: D-Dimer, Quant: 0.69 ug/mL-FEU — ABNORMAL HIGH (ref 0.00–0.50)

## 2019-01-21 LAB — MAGNESIUM: Magnesium: 2.1 mg/dL (ref 1.7–2.4)

## 2019-01-21 LAB — PHOSPHORUS: Phosphorus: 3.3 mg/dL (ref 2.5–4.6)

## 2019-01-21 LAB — C-REACTIVE PROTEIN: CRP: 1 mg/dL — ABNORMAL HIGH (ref <1.0)

## 2019-01-21 LAB — SEDIMENTATION RATE: Sed Rate: 40 mm/hr — ABNORMAL HIGH (ref 0–22)

## 2019-01-21 LAB — FERRITIN: Ferritin: 136 ng/mL (ref 11–307)

## 2019-01-21 MED ORDER — INSULIN ASPART 100 UNIT/ML ~~LOC~~ SOLN
0.0000 [IU] | Freq: Three times a day (TID) | SUBCUTANEOUS | Status: DC
  Administered 2019-01-21: 1 [IU] via SUBCUTANEOUS

## 2019-01-21 MED ORDER — INSULIN ASPART 100 UNIT/ML ~~LOC~~ SOLN: 0.0000 [IU] | Freq: Every day | SUBCUTANEOUS | Status: DC

## 2019-01-21 MED ORDER — DIPHENOXYLATE-ATROPINE 2.5-0.025 MG PO TABS
1.0000 | ORAL_TABLET | Freq: Three times a day (TID) | ORAL | Status: DC
  Administered 2019-01-22 – 2019-01-28 (×18): 1 via ORAL
  Filled 2019-01-21 (×18): qty 1

## 2019-01-21 MED ORDER — GLYCOPYRROLATE 1 MG PO TABS
1.0000 mg | ORAL_TABLET | Freq: Three times a day (TID) | ORAL | Status: DC
  Administered 2019-01-22 – 2019-01-28 (×17): 1 mg via ORAL
  Filled 2019-01-21 (×22): qty 1

## 2019-01-21 NOTE — Progress Notes (Signed)
PROGRESS NOTE    [name]  QIW:[id number] DOB: [birth year] DOA: 01/16/2019 PCP: Lajean Manes, MD   Brief Narrative:  The patient is an 85 year old Caucasian female from [facility] facility with a past medical history significant for but not limited to anemia, arthritis, chronic diarrhea from her prior Colon Surgery, COPD, GERD, hypertension and other comorbidities who presented to the hospital after being tested positive for COVID-19 disease.  Patient tested positive for COVID-19 at her facility.  She was sent by her son to be checked out and her only complaint was sneezing.  In the ED she is found to be febrile with oxygen saturation intermittently dropping to the mid to high 80s and she was placed on supplemental oxygen via nasal cannula 2 L with she is off of it now.  Initially she was not neutropenic but repeat CBC this morning showed neutropenia and also showed a hemoglobin of 10.9.  She has been given steroids and remdesivir and has been weaned off of oxygen and is doing better.  Physical therapy and Occupational Therapy saw the patient and OT reccomending SNF but PT recommending Home Health.  TOC Team consulted for help with disposition and placement  Assessment & Plan:   Principal Problem:   Pneumonia due to COVID-19 virus Active Problems:   Sepsis ([place])   Acute respiratory failure with hypoxia ([place])   Pulmonary nodule   Thoracic aortic aneurysm ([place])  Acute Hypoxic Respiratory Failure secondary to COVID-19 Multifocal pneumonia and SIRS from COVID-19 in a patient with underlying COPD, improving -Tested positive for COVID-19 11/19. -Oxygen saturation intermittently dropping to the mid to high 80s and was patient placed on 2 L supplemental oxygen and now weaned off and on Room Air; -She ambulated with PT and her O2 was >90% -Temperature 102.2 F initially andSlightly tachycardic and tachypneic. No leukocytosis, does have lymphopenia.  -Lactic acid 2.9 -> 0.7.  Procalcitonin <0.10. -Inflammatory markers elevated but trending down now: Recent Labs    01/19/19 0325 01/20/19 0424 01/21/19 0501  DDIMER 0.96* 0.81* 0.69*  FERRITIN 172 152 136  LDH 148 160 163  CRP 1.5*  1.5* 1.5* 1.0*  -Fibrinogen 582 -> 541 -> 511 -> 499 -> 463 -> 454 -ESR went from 52 -> 42 -> 57 -> 43 -> 40 Lab Results  Component Value Date   SARSCOV2NAA POSITIVE (A) 01/17/2019   [place] NEGATIVE 12/20/2018  -CT angiogram negative for PE but showing evidence of multifocal pneumonia and scattered areas of atelectasis. -Repeat CXR yesterday AM showed "No substantial change. Patchy airspace disease right mid lung and left base. Emphysema with chronic interstitial coarsening." -C/w Decadron 6 mg Daily  -C/w Remdesivir for 5 Days and has completed it today  -Vitamin C and Zinc -C/w Antitussives with Robitussin DM -Tylenol as needed -Incentive spirometry -Albuterol prn and Combivent scheduled Inhaler Treatments  -Trend Daily Inflammatory Markers as above -Continuous pulse ox -Supplemental oxygen to keep oxygen saturation above 90% -Blood culture x2 showed NGTD at 5 Days -Maintain euvolemia to net negative volume status -Repeat CXR in AM   Hypokalemia -Potassium was 3.3 and now is 4.5 -Continue to monitor and Replete as Necessary -Rpeat CMP in a.m.  Thyroid Mass/Multinodular Goiter -CT showingdominant mass arising from right lobe of thyroid measuring 3.3 x 2.5 cm. -Thyroid ultrasound was ordered but will cancel have the patient do this in outpatient setting -Check TSH and was 1.417, free T4 level was 1.02  Pulmonary Nodules -CT showing nodular opacities in the right middle lobe,  largest measuring 7 mm.Patient is a former smoker. -Repeat chest CT in 13month  Thoracic Aortic Aneurysm -CT showingascending thoracic aorta measures 4.1 x 4.1 cm. No dissection evident. There is aortic atherosclerosis as well as foci of great vessel and coronary artery  calcification. -Patient will need annual imaging follow-up by CTA or MRI  Chronic Diarrhea -Continue with home Lomotil  Normocytic Anemia Patient's hemoglobin/hematocrit went from 11.9/38.1 -> 10.9/34.5 -> 10.4/33.0 -> 11.4/36.0 -> 10.8/34.1 -> 11.3/35.9 -Checked Anemia Panel showed an iron level of 47, U IBC of 190, TIBC of 237, saturation ratios of 20%, ferritin level 152, folate level 29.9, and vitamin B12 level 497 -Continue to monitor for signs and symptoms of bleeding; currently no overt bleeding noted -Repeat CBC in a.m.  Hypophosphatemia -Patient's Phos Level was 3.3 this AM -Continue to Monitor and Replete as Necessary -Repeat Phos Level in AM   Elevated BUN -In the setting of steroid demargination as BUN was 46 -Continue to monitor and trend repeat CMP in AM   AKI  -Mild. Patient's BUN/creatinine went from 37/0.82 -> 43/1.09 -> 46/0.88 Avoid nephrotoxic medications, contrast dyes as well as hypotension and renally dose medications -Started the patient on gentle normal saline at a rate of 75 mL's per hour for 12 hours and stopped  -Continue monitor and trend renal function -Repeat CMP in a.m.  Hyponatremia -Patient's Na+ went from 136 -> 134 -Continue to Monitor and Trend  -Repeat CMP in AM   Hyperglycemia -Patient's Blood Sugars have been elevated on Daily BMP/CMP and ranging from 102-126 -In the setting of Steroid Demargination -Will need to r/o Diabetes Component and will check HbA1c in the AM -Will add Sensitive Novolog SSI AC and HS  DVT prophylaxis: Enoxaparin 40 mg sq q24h Code Status: FULL CODE  Family Communication: No family present at bedside  Disposition Plan: SNF vs. Home Health PT when completed with Remdesivir and [facility]:   None   Procedures: None  Antimicrobials:  Anti-infectives (From admission, onward)   Start     Dose/Rate Route Frequency Ordered Stop   01/18/19 1000  remdesivir 100 mg in sodium  chloride 0.9 % 250 mL IVPB     100 mg 500 mL/hr over 30 Minutes Intravenous Every 24 hours 01/17/19 0320 01/21/19 1001   01/17/19 0400  remdesivir 200 mg in sodium chloride 0.9 % 250 mL IVPB     200 mg 500 mL/hr over 30 Minutes Intravenous Once 01/17/19 0320 01/17/19 0710     Subjective: Seen and examined at bedside states she still did not sleep very well last night.  Does not complain of any shortness of breath or nausea or vomiting.  States that she did well with therapy yesterday and nursing confirmed this.  No other concerns or complaints at this time but wants to know what will happen [facility]  Objective: Vitals:   01/20/19 1323 01/20/19 2146 01/21/19 0505 01/21/19 1400  BP: (!) 163/88 (!) 164/91 (!) 179/89 139/79  Pulse: 75 93 73 89  Resp: _0 Temp: 97.6 F (36.4 C) 97.9 F (36.6 C) 98.1 F (36.7 C) 97.6 F (36.4 C)  TempSrc: Oral Oral Oral Oral  SpO2: 95% 97% 96% 95%  Weight:      Height:        Intake/Output Summary (Last 24 hours) at 01/21/2019 1615 Last data filed at 01/21/2019 0900 Gross per 24 hour  Intake 480 ml  Output  300 ml  Net 180 ml   Filed Weights   01/17/19 0644  Weight: 51.3 kg   Examination: Physical Exam:  Constitutional: Thin elderly Caucasian female in NAD and appears calm  Eyes: Lids and conjunctivae normal, sclerae anicteric  ENMT: External Ears, Nose appear normal. Grossly normal hearing. Mucous membranes are moist.  Neck: Appears normal, supple, no cervical masses, normal ROM, no appreciable thyromegaly; no JVD Respiratory: Diminished to auscultation bilaterally with coarse breath sounds, no wheezing, rales, rhonchi or crackles. Normal respiratory effort and patient is not tachypenic. No accessory muscle use.  Unlabored breathing and she is not wearing supplemental oxygen via nasal cannula Cardiovascular: RRR, no murmurs / rubs / gallops. S1 and S2 auscultated.  Mild extremity edema.   Abdomen: Soft, non-tender, non-distended. Bowel sounds positive.  GU: Deferred. Musculoskeletal: No clubbing / cyanosis of digits/nails. No joint deformity upper and lower extremities.  Skin: No rashes, lesions, ulcers on limited skin evaluation. No induration; Warm and dry.  Neurologic: CN 2-12 grossly intact with no focal deficits. Romberg sign and cerebellar reflexes not assessed.  Psychiatric: Normal judgment and insight. Alert and oriented x 3. Normal mood and appropriate affect.   Data Reviewed: I have personally reviewed following labs and imaging studies  CBC: Recent Labs  Lab 01/17/19 0451 01/18/19 0524 01/19/19 0325 01/20/19 0424 01/21/19 0501  WBC 3.8* 4.7 5.6 4.6 7.8  NEUTROABS 2.3 3.3 4.0 3.4 5.6  HGB 10.9* 10.4* 11.4* 10.8* 11.3*  HCT 34.5* 33.0* 36.0 34.1* 35.9*  MCV 96.6 95.9 95.0 93.9 95.2  PLT 177 184 228 245 276   Basic Metabolic Panel: Recent Labs  Lab 01/17/19 0451 01/17/19 0901 01/18/19 0524 01/19/19 0325 01/20/19 0424 01/21/19 0501  NA 136  --  136 135 136 134*  K 3.5  --  5.1 4.9 4.5 4.5  CL 99  --  103 98 100 99  CO2 26  --  _0 GLUCOSE 105*  --  102* 125* 126* 112*  BUN 21  --  32* 37* 43* 46*  CREATININE 0.83  --  0.90 0.82 1.09* 0.88  CALCIUM 8.6*  --  9.0 8.9 8.6* 8.7*  MG  --  1.9 1.9 2.0 2.1 2.1  PHOS  --  2.5 2.4* 2.7 3.8 3.3   GFR: Estimated Creatinine Clearance: 37.2 mL/min (by C-G formula based on SCr of 0.88 mg/dL). Liver Function Tests: Recent Labs  Lab 01/16/19 2151 01/18/19 0524 01/19/19 0325 01/20/19 0424 01/21/19 0501  AST 35 _1 32  ALT _2 ALKPHOS 89 70 77 82 77  BILITOT 0.3 0.3 0.2* 0.2* 0.3  PROT 7.2 5.9* 6.1* 5.7* 5.7*  ALBUMIN 3.4* 2.7* 2.6* 2.5* 2.6*   No results for input(s): LIPASE, AMYLASE in the last 168 hours. No results for input(s): AMMONIA in the last 168 hours. Coagulation Profile: No results for input(s): INR, PROTIME in the last 168 hours. Cardiac Enzymes: No  results for input(s): CKTOTAL, CKMB, CKMBINDEX, TROPONINI in the last 168 hours. BNP (last 3 results) No results for input(s): PROBNP in the last 8760 hours. HbA1C: No results for input(s): HGBA1C in the last 72 hours. CBG: No results for input(s): GLUCAP in the last 168 hours. Lipid Profile: No results for input(s): CHOL, HDL, LDLCALC, TRIG, CHOLHDL, LDLDIRECT in the last 72 hours. Thyroid Function Tests: No results for input(s): TSH, T4TOTAL, FREET4, T3FREE, THYROIDAB in the last 72 hours. Anemia Panel: Recent Labs    01/20/19 0424  01/21/19 0501  VITAMINB12 497  --   FOLATE 29.9  --   FERRITIN 152 136  TIBC 237*  --   IRON 47  --   RETICCTPCT 1.2  --    Sepsis Labs: Recent Labs  Lab 01/16/19 2151 01/16/19 2351  PROCALCITON <0.10  --   LATICACIDVEN 2.9* 0.7    Recent Results (from the past 240 hour(s))  Blood Culture (routine x 2)     Status: None   Collection Time: 01/16/19  9:51 PM   Specimen: BLOOD  Result Value Ref Range Status   Specimen Description   Final    BLOOD LEFT ANTECUBITAL Performed at [facility], [place] [address], [place], [place] [address]    Special Requests   Final    BOTTLES DRAWN AEROBIC AND ANAEROBIC Blood Culture adequate volume Performed at [facility] [address] [place], [place] [address]    Culture   Final    NO GROWTH 5 DAYS Performed at [facility], [place] [address] [place], [place] [address]    Report Status 01/21/2019 FINAL  Final  Blood Culture (routine x 2)     Status: None   Collection Time: 01/16/19  9:56 PM   Specimen: BLOOD  Result Value Ref Range Status   Specimen Description   Final    BLOOD RIGHT ANTECUBITAL Performed at [facility] [address], [place], [place] [address]    Special Requests   Final    BOTTLES DRAWN AEROBIC AND ANAEROBIC Blood Culture adequate volume Performed at [facility] [address]  [place], [place] [address]    Culture   Final    NO GROWTH 5 DAYS Performed at [facility], [place] [address] [place], [place] [address]    Report Status 01/21/2019 FINAL  Final  SARS CORONAVIRUS 2 (TAT 6-24 HRS) Nasopharyngeal Nasopharyngeal Swab     Status: Abnormal   Collection Time: 01/17/19  1:13 AM   Specimen: Nasopharyngeal Swab  Result Value Ref Range Status   SARS Coronavirus 2 POSITIVE (A) NEGATIVE Final    Comment: RESULT CALLED TO, READ BACK BY AND VERIFIED WITH: Henrietta Hoover RN 15:25 01/17/19 ([email or web address]) (NOTE) SARS-CoV-2 target nucleic acids are DETECTED. The SARS-CoV-2 RNA is generally detectable in upper and lower respiratory specimens during the acute phase of infection. Positive results are indicative of active infection with SARS-CoV-2. Clinical  correlation with patient history and other diagnostic information is necessary to determine patient infection status. Positive results do  not rule out bacterial infection or co-infection with other viruses. The expected result is Negative. Fact Sheet for Patients: SugarRoll.be Fact Sheet for Healthcare Providers: https://www.woods-mathews.com/ This test is not yet approved or cleared by the [place] FDA and  has been authorized for detection and/or diagnosis of SARS-CoV-2 by FDA under an Emergency Use Authorization (EUA). This EUA will remain  in effect (meaning this test can be used) fo r the duration of the COVID-19 declaration under Section 564(b)(1) of the Act, 21 U.S.C. section 360bbb-3(b)(1), unless the authorization is terminated or revoked sooner. Performed at [facility], [place] [address] [place], [place] [address]   MRSA PCR Screening     Status: None   Collection Time: 01/17/19  6:24 AM   Specimen: Nasopharyngeal  Result Value Ref Range Status   MRSA by PCR NEGATIVE NEGATIVE Final    Comment:        The GeneXpert MRSA Assay (FDA  approved for NASAL  specimens only), is one component of a comprehensive MRSA colonization surveillance program. It is not intended to diagnose MRSA infection nor to guide or monitor treatment for MRSA infections. Performed at [facility], [place] [address], [place], [place] [address]      RN Pressure Injury Documentation: Pressure Injury 12/20/18 Coccyx Medial Stage II -  Partial thickness loss of dermis presenting as a shallow open ulcer with a red, pink wound bed without slough. pink, open skin, non-blanchable (Active)  12/20/18 1415  Location: Coccyx  Location Orientation: Medial  Staging: Stage II -  Partial thickness loss of dermis presenting as a shallow open ulcer with a red, pink wound bed without slough.  Wound Description (Comments): pink, open skin, non-blanchable  Present on Admission: Yes    Radiology Studies: Dg Chest Port 1 View  Result Date: 01/20/2019 CLINICAL DATA:  COVID-19 positive EXAM: PORTABLE CHEST 1 VIEW COMPARISON:  01/19/2019 FINDINGS: Stable cardiomediastinal contours. Hyperexpanded lungs. Minimal vague opacity in the right perihilar region. Previously seen opacity in the lung bases appear less conspicuous on today's exam. Blunting of the left costophrenic angle may reflect interval development of a small left pleural effusion. No pneumothorax. Left reverse shoulder arthroplasty. IMPRESSION: 1. Previously described airspace opacities appear less conspicuous compared to prior exam. 2. Blunting of the left costophrenic angle may reflect a small left pleural effusion. Electronically Signed   By: Davina Poke M.D.   On: 01/20/2019 16:50   Scheduled Meds: . dexamethasone (DECADRON) injection  6 mg Intravenous Q0600  . diphenoxylate-atropine  1 tablet Oral TID AC  . enoxaparin (LOVENOX) injection  40 mg Subcutaneous Q0600  . glycopyrrolate  1 mg Oral TID AC  . Ipratropium-Albuterol  1 puff Inhalation BID  . mirabegron ER  50 mg Oral QHS  . multivitamin with  minerals  1 tablet Oral Daily  . Ensure Max Protein  11 oz Oral TID PC  . vitamin C  500 mg Oral Daily  . zinc sulfate  220 mg Oral Daily   Continuous Infusions:   LOS: 4 days   Kerney Elbe, DO [facility] PAGER is on [place]  If 7PM-7AM, please contact night-coverage [email or web address]

## 2019-01-21 NOTE — Plan of Care (Signed)
Plan of care reviewed with the patient. 

## 2019-01-22 ENCOUNTER — Inpatient Hospital Stay (HOSPITAL_COMMUNITY): Payer: Medicare Other

## 2019-01-22 LAB — COMPREHENSIVE METABOLIC PANEL WITH GFR
ALT: 31 U/L (ref 0–44)
AST: 27 U/L (ref 15–41)
Albumin: 2.6 g/dL — ABNORMAL LOW (ref 3.5–5.0)
Alkaline Phosphatase: 62 U/L (ref 38–126)
Anion gap: 8 (ref 5–15)
BUN: 50 mg/dL — ABNORMAL HIGH (ref 8–23)
CO2: 26 mmol/L (ref 22–32)
Calcium: 8.4 mg/dL — ABNORMAL LOW (ref 8.9–10.3)
Chloride: 99 mmol/L (ref 98–111)
Creatinine, Ser: 0.94 mg/dL (ref 0.44–1.00)
GFR calc Af Amer: >60 mL/min
GFR calc non Af Amer: 55 mL/min — ABNORMAL LOW
Glucose, Bld: 102 mg/dL — ABNORMAL HIGH (ref 70–99)
Potassium: 4.4 mmol/L (ref 3.5–5.1)
Sodium: 133 mmol/L — ABNORMAL LOW (ref 135–145)
Total Bilirubin: 0.6 mg/dL (ref 0.3–1.2)
Total Protein: 5.6 g/dL — ABNORMAL LOW (ref 6.5–8.1)

## 2019-01-22 LAB — CBC WITH DIFFERENTIAL/PLATELET
Abs Immature Granulocytes: 0.6 10*3/uL — ABNORMAL HIGH (ref 0.00–0.07)
Basophils Absolute: 0 10*3/uL (ref 0.0–0.1)
Basophils Relative: 0 %
Eosinophils Absolute: 0 10*3/uL (ref 0.0–0.5)
Eosinophils Relative: 0 %
HCT: 33.5 % — ABNORMAL LOW (ref 36.0–46.0)
Hemoglobin: 10.8 g/dL — ABNORMAL LOW (ref 12.0–15.0)
Immature Granulocytes: 7 %
Lymphocytes Relative: 15 %
Lymphs Abs: 1.3 10*3/uL (ref 0.7–4.0)
MCH: 30.5 pg (ref 26.0–34.0)
MCHC: 32.2 g/dL (ref 30.0–36.0)
MCV: 94.6 fL (ref 80.0–100.0)
Monocytes Absolute: 0.9 10*3/uL (ref 0.1–1.0)
Monocytes Relative: 10 %
Neutro Abs: 6.2 10*3/uL (ref 1.7–7.7)
Neutrophils Relative %: 68 %
Platelets: 290 10*3/uL (ref 150–400)
RBC: 3.54 MIL/uL — ABNORMAL LOW (ref 3.87–5.11)
RDW: 13.7 % (ref 11.5–15.5)
WBC: 9 10*3/uL (ref 4.0–10.5)
nRBC: 0 % (ref 0.0–0.2)

## 2019-01-22 LAB — GLUCOSE, CAPILLARY
Glucose-Capillary: 103 mg/dL — ABNORMAL HIGH (ref 70–99)
Glucose-Capillary: 215 mg/dL — ABNORMAL HIGH (ref 70–99)
Glucose-Capillary: 154 mg/dL — ABNORMAL HIGH (ref 70–99)
Glucose-Capillary: 155 mg/dL — ABNORMAL HIGH (ref 70–99)

## 2019-01-22 LAB — FERRITIN: Ferritin: 132 ng/mL (ref 11–307)

## 2019-01-22 LAB — D-DIMER, QUANTITATIVE: D-Dimer, Quant: 0.69 ug/mL-FEU — ABNORMAL HIGH (ref 0.00–0.50)

## 2019-01-22 LAB — LACTATE DEHYDROGENASE: LDH: 137 U/L (ref 98–192)

## 2019-01-22 LAB — FIBRINOGEN: Fibrinogen: 399 mg/dL (ref 210–475)

## 2019-01-22 LAB — C-REACTIVE PROTEIN: CRP: <0.8 mg/dL (ref <1.0)

## 2019-01-22 LAB — SEDIMENTATION RATE: Sed Rate: 35 mm/hr — ABNORMAL HIGH (ref 0–22)

## 2019-01-22 LAB — HEMOGLOBIN A1C
Hgb A1c MFr Bld: 5.9 % — ABNORMAL HIGH (ref 4.8–5.6)
Mean Plasma Glucose: 122.63 mg/dL

## 2019-01-22 LAB — PHOSPHORUS: Phosphorus: 3.7 mg/dL (ref 2.5–4.6)

## 2019-01-22 LAB — MAGNESIUM: Magnesium: 2.3 mg/dL (ref 1.7–2.4)

## 2019-01-22 NOTE — Progress Notes (Signed)
Physical Therapy Treatment Patient Details Name: [name] MRN: [id number] DOB: 1930-10-01 Today's Date: 01/22/2019    History of Present Illness 85 year old Caucasian female from [facility] facility with a past medical history significant for but not limited to anemia, arthritis, chronic diarrhea from her prior Colon Surgery, COPD, GERD, hypertension and other comorbidities who presented to the hospital after being tested positive for COVID-19 disease    PT Comments    Progressing with mobility. Discussed d/c plan during session today. Pt does not feel she can manage at her home alone. She is also unable to return to [facility]. Recommendation has been updated to reflect need to return to SNF for continued rehab to regain independence with functional mobility. Will continue to follow during hospital stay.     Follow Up Recommendations  SNF     Equipment Recommendations  None recommended by PT    Recommendations for Other Services       Precautions / Restrictions Precautions Precautions: Fall Restrictions Weight Bearing Restrictions: No    Mobility  Bed Mobility Overal bed mobility: Needs Assistance Bed Mobility: Supine to Sit;Sit to Supine     Supine to sit: Supervision;HOB elevated Sit to supine: Supervision;HOB elevated      Transfers Overall transfer level: Needs assistance Equipment used: Rolling walker (2 wheeled) Transfers: Sit to/from Stand Sit to Stand: Min assist         General transfer comment: Assist to rise, steady, control descent. VCs safety, technqiue, hand placement.  Ambulation/Gait Ambulation/Gait assistance: Min guard Gait Distance (Feet): 100 Feet Assistive device: Rolling walker (2 wheeled) Gait Pattern/deviations: Step-through pattern;Decreased stride length     General Gait Details: close guard for safety. pt tolerated increased distance well.   Stairs             Wheelchair Mobility    Modified Rankin (Stroke  Patients Only)       Balance Overall balance assessment: Needs assistance         Standing balance support: Bilateral upper extremity supported Standing balance-Leahy Scale: Poor                              Cognition Arousal/Alertness: Awake/alert Behavior During Therapy: WFL for tasks assessed/performed Overall Cognitive Status: Within Functional Limits for tasks assessed                                        Exercises      General Comments        Pertinent Vitals/Pain Pain Assessment: No/denies pain    Home Living                      Prior Function            PT Goals (current goals can now be found in the care plan section) Progress towards PT goals: Progressing toward goals    Frequency    Min 3X/week      PT Plan Current plan remains appropriate    Co-evaluation              AM-PAC PT "6 Clicks" Mobility   Outcome Measure  Help needed turning from your back to your side while in a flat bed without using bedrails?: A Little Help needed moving from lying on your back to sitting on the side of  a flat bed without using bedrails?: A Little Help needed moving to and from a bed to a chair (including a wheelchair)?: A Little Help needed standing up from a chair using your arms (e.g., wheelchair or bedside chair)?: A Little Help needed to walk in hospital room?: A Little Help needed climbing 3-5 steps with a railing? : A Little 6 Click Score: 18    End of Session   Activity Tolerance: Patient tolerated treatment well Patient left: in bed;with call bell/phone within reach;with bed alarm set   PT Visit Diagnosis: Unsteadiness on feet (R26.81);Muscle weakness (generalized) (M62.81);History of falling (Z91.81)     Time: 1202-1239 PT Time Calculation (min) (ACUTE ONLY): 37 min  Charges:  $Gait Training: 23-37 mins                       Weston Anna, PT Acute Rehabilitation Services Pager:  [phone] Office: [phone]

## 2019-01-22 NOTE — TOC Progression Note (Signed)
Transition of Care [facility]) - Progression Note    Patient Details  Name: [name] MRN: [id number] Date of Birth: Jun 04, 1930  [facility] CM/SW Contact  Purcell Mouton, RN Phone Number: 01/22/2019, 3:38 PM  Clinical Narrative:    Spoke with pt, [facility] admission coordinator, pt is not able to Pro[facility] ADL and live in [place] setting at this at this time. Pt agreed with being faxed to SNF.    Expected Discharge Plan: Assisted Living    Expected Discharge Plan and Services Expected Discharge Plan: Assisted Living   Discharge Planning Services: CM Consult   Living arrangements for the past 2 months: Assisted Living Facility                                       Social Determinants of Health (SDOH) Interventions    Readmission Risk Interventions No flowsheet data found.

## 2019-01-22 NOTE — Progress Notes (Signed)
PROGRESS NOTE    [name]  [id number] DOB: 07/02/1930 DOA: 01/16/2019 PCP: Lajean Manes, MD    Brief Narrative:  Patient is 85 year old Caucasian female from assisted living facility with history of anemia, arthritis, chronic diarrhea from prior colon surgery, COPD, GERD, hypertension who presented to the hospital after being tested positive for COVID-19.  Patient was sent to the ER by her son, apparently her complaints were only sneezing.  In the emergency room she was found to be febrile with oxygen saturation intermittently dropping to mid to high 80s.  She was treated with steroids and remdesivir and has been weaned off the oxygen. Pending disposition.   Assessment & Plan:   Principal Problem:   Pneumonia due to COVID-19 virus Active Problems:   Sepsis ([place])   Acute respiratory failure with hypoxia ([place])   Pulmonary nodule   Thoracic aortic aneurysm ([place])  Acute hypoxemic respiratory failure secondary to COVID-19 pneumonia, sepsis present on admission resolved: Clinically improved.  Afebrile. Finished remdesivir therapy. Remains on dexamethasone, will continue for total 10 days. Symptomatic treatment.  Hypoglycemia: In the setting of steroid use.  Hemoglobin A1c is 5.9.  No indication for treatment.  Hypertension: Blood pressures are stable.  She is on amlodipine at home.  Will resume.   DVT prophylaxis: Lovenox subcu Code Status: Full code Family Communication: None Disposition Plan: Pending.  Assisted living facility versus SNF.   Consultants:   None  Procedures:   None  Antimicrobials:   Remdesivir, 01/16/2019-01/21/2019   Subjective: Patient seen and examined.  No overnight events.  Remains on room air.  She is not sure where she can go.  Objective: Vitals:   01/21/19 1400 01/21/19 2057 01/22/19 0530 01/22/19 1240  BP: 139/79 (!) 143/85 (!) 157/82 (!) 141/59  Pulse: 89 84 (!) 52 84  Resp: 18 20 18  (!) 22  Temp: 97.6 F (36.4 C) 97.9 F  (36.6 C) (!) 97.4 F (36.3 C) (!) 97.5 F (36.4 C)  TempSrc: Oral Oral Oral Oral  SpO2: 95% 98% 94% 96%  Weight:      Height:        Intake/Output Summary (Last 24 hours) at 01/22/2019 1433 Last data filed at 01/22/2019 0600 Gross per 24 hour  Intake 120 ml  Output 350 ml  Net -230 ml   Filed Weights   01/17/19 0644  Weight: 51.3 kg    Examination:  General exam: Appears calm and comfortable  Respiratory system: Clear to auscultation. Respiratory effort normal. Cardiovascular system: S1 & S2 heard, RRR. No JVD, murmurs, rubs, gallops or clicks. No pedal edema. Gastrointestinal system: Abdomen is nondistended, soft and nontender. No organomegaly or masses felt. Normal bowel sounds heard. Central nervous system: Alert and oriented. No focal neurological deficits. Extremities: Symmetric 5 x 5 power. Skin: No rashes, lesions or ulcers Psychiatry: Judgement and insight appear normal. Mood & affect appropriate.     Data Reviewed: I have personally reviewed following labs and imaging studies  CBC: Recent Labs  Lab 01/18/19 0524 01/19/19 0325 01/20/19 0424 01/21/19 0501 01/22/19 0425  WBC 4.7 5.6 4.6 7.8 9.0  NEUTROABS 3.3 4.0 3.4 5.6 6.2  HGB 10.4* 11.4* 10.8* 11.3* 10.8*  HCT 33.0* 36.0 34.1* 35.9* 33.5*  MCV 95.9 95.0 93.9 95.2 94.6  PLT 184 228 245 258 [id number]   Basic Metabolic Panel: Recent Labs  Lab 01/18/19 0524 01/19/19 0325 01/20/19 0424 01/21/19 0501 01/22/19 0425  NA 136 135 136 134* 133*  K 5.1 4.9 4.5 4.5 4.4  CL 103 98 100 99 99  CO2 25 27 26 25 26   GLUCOSE 102* 125* 126* 112* 102*  BUN 32* 37* 43* 46* 50*  CREATININE 0.90 0.82 1.09* 0.88 0.94  CALCIUM 9.0 8.9 8.6* 8.7* 8.4*  MG 1.9 2.0 2.1 2.1 2.3  PHOS 2.4* 2.7 3.8 3.3 3.7   GFR: Estimated Creatinine Clearance: 34.8 mL/min (by C-G formula based on SCr of 0.94 mg/dL). Liver Function Tests: Recent Labs  Lab 01/18/19 0524 01/19/19 0325 01/20/19 0424 01/21/19 0501 01/22/19 0425  AST 22  19 29  32 27  ALT 18 16 23 31 31   ALKPHOS 70 77 82 77 62  BILITOT 0.3 0.2* 0.2* 0.3 0.6  PROT 5.9* 6.1* 5.7* 5.7* 5.6*  ALBUMIN 2.7* 2.6* 2.5* 2.6* 2.6*   No results for input(s): LIPASE, AMYLASE in the last 168 hours. No results for input(s): AMMONIA in the last 168 hours. Coagulation Profile: No results for input(s): INR, PROTIME in the last 168 hours. Cardiac Enzymes: No results for input(s): CKTOTAL, CKMB, CKMBINDEX, TROPONINI in the last 168 hours. BNP (last 3 results) No results for input(s): PROBNP in the last 8760 hours. HbA1C: Recent Labs    01/22/19 0425  HGBA1C 5.9*   CBG: Recent Labs  Lab 01/21/19 1645 01/21/19 2054 01/22/19 0901 01/22/19 1237  GLUCAP 145* 117* 103* 215*   Lipid Profile: No results for input(s): CHOL, HDL, LDLCALC, TRIG, CHOLHDL, LDLDIRECT in the last 72 hours. Thyroid Function Tests: No results for input(s): TSH, T4TOTAL, FREET4, T3FREE, THYROIDAB in the last 72 hours. Anemia Panel: Recent Labs    01/20/19 0424 01/21/19 0501 01/22/19 0425  VITAMINB12 497  --   --   FOLATE 29.9  --   --   FERRITIN 152 136 132  TIBC 237*  --   --   IRON 47  --   --   RETICCTPCT 1.2  --   --    Sepsis Labs: Recent Labs  Lab 01/16/19 2151 01/16/19 2351  PROCALCITON <0.10  --   LATICACIDVEN 2.9* 0.7    Recent Results (from the past 240 hour(s))  Blood Culture (routine x 2)     Status: None   Collection Time: 01/16/19  9:51 PM   Specimen: BLOOD  Result Value Ref Range Status   Specimen Description   Final    BLOOD LEFT ANTECUBITAL Performed at [facility], [place] [address] [place] [address] [place], [place] [address]    Special Requests   Final    BOTTLES DRAWN AEROBIC AND ANAEROBIC Blood Culture adequate volume Performed at [place] [address], [place], [place] [address]    Culture   Final    NO GROWTH 5 DAYS Performed at [facility], [place] [address], [place], [place] [address]    Report Status  01/21/2019 FINAL  Final  Blood Culture (routine x 2)     Status: None   Collection Time: 01/16/19  9:56 PM   Specimen: BLOOD  Result Value Ref Range Status   Specimen Description   Final    BLOOD RIGHT ANTECUBITAL Performed at [place] [address] [place], [place] [address]    Special Requests   Final    BOTTLES DRAWN AEROBIC AND ANAEROBIC Blood Culture adequate volume Performed at [place] [address] [place],  [address]    Culture   Final    NO GROWTH 5 DAYS Performed at [facility], [place],  [place] [address]    Report Status 01/21/2019 FINAL  Final  SARS CORONAVIRUS 2 (TAT 6-24 HRS) Nasopharyngeal Nasopharyngeal Swab     Status: Abnormal   Collection Time: 01/17/19  1:13 AM   Specimen: Nasopharyngeal Swab  Result Value Ref Range Status   SARS Coronavirus 2 POSITIVE (A) NEGATIVE Final    Comment: RESULT CALLED TO, READ BACK BY AND VERIFIED WITH: Henrietta Hoover RN 15:25 01/17/19 ([email or web address]) (NOTE) SARS-CoV-2 target nucleic acids are DETECTED. The SARS-CoV-2 RNA is generally detectable in upper and lower respiratory specimens during the acute phase of infection. Positive results are indicative of active infection with SARS-CoV-2. Clinical  correlation with patient history and other diagnostic information is necessary to determine patient infection status. Positive results do  not rule out bacterial infection or co-infection with other viruses. The expected result is Negative. Fact Sheet for Patients: SugarRoll.be Fact Sheet for Healthcare Providers: https://www.woods-mathews.com/ This test is not yet approved or cleared by the [place] FDA and  has been authorized for detection and/or diagnosis of SARS-CoV-2 by FDA under an Emergency Use Authorization (EUA). This EUA will remain  in effect (meaning this test can be used) fo r the duration of  the COVID-19 declaration under Section 564(b)(1) of the Act, 21 U.S.C. section 360bbb-3(b)(1), unless the authorization is terminated or revoked sooner. Performed at [facility], [place] [address] [place], [place] [address]   MRSA PCR Screening     Status: None   Collection Time: 01/17/19  6:24 AM   Specimen: Nasopharyngeal  Result Value Ref Range Status   MRSA by PCR NEGATIVE NEGATIVE Final    Comment:        The GeneXpert MRSA Assay (FDA approved for NASAL specimens only), is one component of a comprehensive MRSA colonization surveillance program. It is not intended to diagnose MRSA infection nor to guide or monitor treatment for MRSA infections. Performed at [facility], [place] [address], [place], [place] [address]          Radiology Studies: Dg Chest Port 1 View  Result Date: 01/22/2019 CLINICAL DATA:  COVID-19. EXAM: PORTABLE CHEST 1 VIEW COMPARISON:  January 20, 2019. FINDINGS: Stable cardiomediastinal silhouette. No pneumothorax is noted. Right lung is clear. Minimal left basilar atelectasis is noted with possible small left pleural effusion. Status post left shoulder arthroplasty. IMPRESSION: Minimal left basilar atelectasis is noted with possible small left pleural effusion. No other abnormality seen in the chest. Electronically Signed   By: Marijo Conception M.D.   On: 01/22/2019 07:52   Dg Chest Port 1 View  Result Date: 01/20/2019 CLINICAL DATA:  COVID-19 positive EXAM: PORTABLE CHEST 1 VIEW COMPARISON:  01/19/2019 FINDINGS: Stable cardiomediastinal contours. Hyperexpanded lungs. Minimal vague opacity in the right perihilar region. Previously seen opacity in the lung bases appear less conspicuous on today's exam. Blunting of the left costophrenic angle may reflect interval development of a small left pleural effusion. No pneumothorax. Left reverse shoulder arthroplasty. IMPRESSION: 1. Previously described airspace opacities appear less  conspicuous compared to prior exam. 2. Blunting of the left costophrenic angle may reflect a small left pleural effusion. Electronically Signed   By: Davina Poke M.D.   On: 01/20/2019 16:50        Scheduled Meds: . dexamethasone (DECADRON) injection  6 mg Intravenous Q0600  . diphenoxylate-atropine  1 tablet Oral Q8H  . enoxaparin (LOVENOX) injection  40 mg Subcutaneous Q0600  . glycopyrrolate  1 mg Oral Q8H  . Ipratropium-Albuterol  1  puff Inhalation BID  . mirabegron ER  50 mg Oral QHS  . multivitamin with minerals  1 tablet Oral Daily  . Ensure Max Protein  11 oz Oral TID PC  . vitamin C  500 mg Oral Daily  . zinc sulfate  220 mg Oral Daily   Continuous Infusions:   LOS: 5 days    Time spent: 25 minutes    Barb Merino, MD [facility] Pager [phone]

## 2019-01-22 NOTE — Care Management Important Message (Signed)
Important Message  Patient Details IM Letter given to [name] RN to present to the Patient Name: [name] MRN: [id number] Date of Birth: 1930/05/06   Medicare Important Message Given:  Yes     Kerin Salen 01/22/2019, 11:29 AM

## 2019-01-22 NOTE — Progress Notes (Signed)
Occupational Therapy Treatment Patient Details Name: [name] MRN: [id number] DOB: 1930-07-24 Today's Date: 01/22/2019    History of present illness 85 year old Caucasian female from [facility] facility with a past medical history significant for but not limited to anemia, arthritis, chronic diarrhea from her prior Colon Surgery, COPD, GERD, hypertension and other comorbidities who presented to the hospital after being tested positive for COVID-19 disease   OT comments  Performed functional mobility in room, LB dressing, and standing grooming task. Pt needs min A to stand. She feels that she caught COVID at snf and doesn't really want to return there.  She is not independent at this time to return to [facility] (and she said she must be negative to return there).  Feel she may need SNF initially, even though this is not her wish. Will continue to follow.     Follow Up Recommendations  SNF    Equipment Recommendations  3 in 1 bedside commode(was supposed to get one from snf)    Recommendations for Other Services      Precautions / Restrictions Precautions Precautions: Fall Restrictions Weight Bearing Restrictions: No       Mobility Bed Mobility Overal bed mobility: Needs Assistance Bed Mobility: Supine to Sit;Sit to Supine     Supine to sit: Supervision;HOB elevated Sit to supine: Supervision;HOB elevated      Transfers Overall transfer level: Needs assistance Equipment used: Rolling walker (2 wheeled) Transfers: Sit to/from Stand Sit to Stand: Min assist         General transfer comment: light assist to rise and steady    Balance Overall balance assessment: Needs assistance         Standing balance support: Bilateral upper extremity supported Standing balance-Leahy Scale: Poor Standing balance comment: Reliant on UE support                           ADL either performed or assessed with clinical judgement   ADL       Grooming:  Oral care;Min guard;Standing       Lower Body Bathing: Min guard;Sit to/from stand;With adaptive equipment       Lower Body Dressing: Minimal assistance;Sit to/from stand;With adaptive equipment                 General ADL Comments: ambulated around room, stood for 5 minutes to brush teeth and performed LB adls/AE education. 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  She was so able to don pants (pillowcase) without this.  Issued long sponge and long shoehorn to help her. She doesn't wear socks at home. At this time, she has some swelling and could not get shoes on completely by herself.      Vision       Perception     Praxis      Cognition Arousal/Alertness: Awake/alert Behavior During Therapy: WFL for tasks assessed/performed Overall Cognitive Status: Within Functional Limits for tasks assessed                                          Exercises     Shoulder Instructions       General Comments      Pertinent Vitals/ Pain       Pain Assessment: No/denies pain  Home Living Family/patient expects to be discharged to:: Skilled nursing facility  Additional Comments: was at [facility] x 1 day; was at [facility] prior to this      Prior Functioning/Environment Level of Independence: Independent with assistive device(s)        Comments: Prior to fall, pt was independent with ADLs, IADLs, and driving. At SNF for rehab after fall [facility] - requests not to return to [place]). Pt was using RW and walking to bathroom at SNF. Requiring assistance for LB ADLs.   Frequency  Min 2X/week        Progress Toward Goals  OT Goals(current goals can now be found in the care plan section)  Progress towards OT goals: Progressing toward goals     Plan      Co-evaluation                 AM-PAC OT "6 Clicks" Daily Activity     Outcome Measure   Help from another person eating meals?: None Help from  another person taking care of personal grooming?: A Little Help from another person toileting, which includes using toliet, bedpan, or urinal?: A Little Help from another person bathing (including washing, rinsing, drying)?: A Little Help from another person to put on and taking off regular upper body clothing?: A Little Help from another person to put on and taking off regular lower body clothing?: A Lot 6 Click Score: 18    End of Session    OT Visit Diagnosis: Unsteadiness on feet (R26.81);Other abnormalities of gait and mobility (R26.89);Muscle weakness (generalized) (M62.81);History of falling (Z91.81)   Activity Tolerance Patient tolerated treatment well   Patient Left in bed;with call bell/phone within reach;with bed alarm set   Nurse Communication          Time: [id number] OT Time Calculation (min): 33 min  Charges: OT General Charges $OT Visit: 1 Visit OT Treatments $Self Care/Home Management : 23-37 mins  Lesle Chris, OTR/L Acute Rehabilitation Services [phone] [place] pager [phone] office 01/22/2019   [name] 01/22/2019, 4:14 PM

## 2019-01-22 NOTE — NC FL2 (Signed)
[place] LEVEL OF CARE SCREENING TOOL     IDENTIFICATION  Patient Name: [name] Birthdate: 07/21/1930 Sex: female Admission Date (Current Location): 01/16/2019  [facility] and [place] Number:  Herbalist and Address:         Provider Number: [id number]  Attending Physician Name and Address:  Barb Merino, MD  Relative Name and Phone Number:  Enslee Echavarria [phone]    Current Level of Care: SNF Recommended Level of Care: [place] Prior Approval Number:    Date Approved/Denied:   PASRR Number: [id number]  Discharge Plan: SNF    Current Diagnoses: Patient Active Problem List   Diagnosis Date Noted  . Sepsis ([place]) 01/17/2019  . Acute respiratory failure with hypoxia ([place]) 01/17/2019  . Pneumonia due to COVID-19 virus 01/17/2019  . Pulmonary nodule 01/17/2019  . Thoracic aortic aneurysm ([place]) 01/17/2019  . Fall 12/21/2018  . Weakness 12/20/2018  . Pressure injury of skin 12/20/2018  . Meningioma ([place]) 12/20/2018  . AKI (acute kidney injury) ([place]) 12/20/2018  . Fall at home, initial encounter 04/19/2017  . Bilateral hip dislocation ([place]) 04/19/2017  . Uncontrolled pain 04/19/2017  . Hypertension 04/19/2017  . Leukocytosis 04/19/2017  . Elevated troponin 04/19/2017  . Osteoarthritis of hand 01/01/2016  . 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 Unspecified osteoarthritis, unspecified site 01/01/2016  . S/p reverse total shoulder arthroplasty 08/27/2014  . Chronic diarrhea 12/22/2011    Orientation RESPIRATION BLADDER Height & Weight     Self, Time, Situation, Place  Normal Continent Weight: 51.3 kg Height:  5\' 6"  (167.6 cm)  BEHAVIORAL SYMPTOMS/MOOD NEUROLOGICAL BOWEL NUTRITION STATUS      Continent Diet(regular)  AMBULATORY STATUS COMMUNICATION OF NEEDS Skin   Extensive Assist Verbally Bruising                       Personal Care Assistance Level of Assistance  Bathing, Feeding, Dressing Bathing  Assistance: Limited assistance Feeding assistance: Limited assistance Dressing Assistance: Limited assistance     Functional Limitations Info  Sight, Hearing, Speech Sight Info: Adequate Hearing Info: Adequate Speech Info: Adequate    SPECIAL CARE FACTORS FREQUENCY  PT (By licensed PT), OT (By licensed OT)     PT Frequency: 5x OT Frequency: 5x            Contractures Contractures Info: Not present    Additional Factors Info  Code Status, Allergies Code Status Info: FULL Allergies Info: Hydromorphone, Lisinopril, Mirtazapine, Nitrofuran Derivatives, Septra Bactrim, Sulfamethoxazole trimethoprim           Current Medications (01/22/2019):  This is the current hospital active medication list Current Facility-Administered Medications  Medication Dose Route Frequency Provider Last Rate Last Dose  . acetaminophen (TYLENOL) tablet 650 mg  650 mg Oral Q6H PRN Shela Leff, MD   650 mg at 01/17/19 2144  . albuterol (VENTOLIN HFA) 108 (90 Base) MCG/ACT inhaler 2-4 puff  2-4 puff Inhalation Q4H PRN Shela Leff, MD      . dexamethasone (DECADRON) injection 6 mg  6 mg Intravenous Q0600 Shela Leff, MD   6 mg at 01/22/19 0533  . diphenoxylate-atropine (LOMOTIL) 2.5-0.025 MG per tablet 1 tablet  1 tablet Oral [address] [place], [place]   1 tablet at 01/22/19 1422  . enoxaparin (LOVENOX) injection 40 mg  40 mg Subcutaneous Q0600 Shela Leff, MD   40 mg at 01/22/19 0533  . glycopyrrolate (ROBINUL) tablet 1 mg  1 mg Oral Q8H Sheikh, [place]  Latif, DO   1 mg at 01/22/19 1422  . guaiFENesin-dextromethorphan (ROBITUSSIN DM) 100-10 MG/5ML syrup 10 mL  10 mL Oral Q4H PRN Shela Leff, MD   10 mL at 01/20/19 1121  . Ipratropium-Albuterol (COMBIVENT) respimat 1 puff  1 puff Inhalation BID Raiford Noble [place], DO   1 puff at 01/22/19 [id number]  . mirabegron ER (MYRBETRIQ) tablet 50 mg  50 mg Oral QHS SheikhGeorgina Quint [place], DO   50 mg at 01/21/19 2126  . multivitamin with  minerals tablet 1 tablet  1 tablet Oral Daily Raiford Noble [place], [place]   1 tablet at 01/22/19 [id number]  . protein supplement (ENSURE MAX) liquid  11 oz Oral TID PC Sheikh, [place], DO   11 oz at 01/22/19 [id number]  . vitamin C (ASCORBIC ACID) tablet 500 mg  500 mg Oral Daily Shela Leff, MD   500 mg at 01/22/19 [id number]  . zinc sulfate capsule 220 mg  220 mg Oral Daily Shela Leff, MD   220 mg at 01/22/19 [id number]     Discharge Medications: Please see discharge summary for a list of discharge medications.  Relevant Imaging Results:  Relevant Lab Results:   Additional Information [phone]  Purcell Mouton, RN

## 2019-01-23 LAB — GLUCOSE, CAPILLARY
Glucose-Capillary: 86 mg/dL (ref 70–99)
Glucose-Capillary: 133 mg/dL — ABNORMAL HIGH (ref 70–99)

## 2019-01-23 MED ORDER — SENNOSIDES-DOCUSATE SODIUM 8.6-50 MG PO TABS
1.0000 | ORAL_TABLET | Freq: Two times a day (BID) | ORAL | Status: DC
  Administered 2019-01-24: 1 via ORAL
  Filled 2019-01-23 (×7): qty 1

## 2019-01-23 MED ORDER — DEXAMETHASONE 4 MG PO TABS
6.0000 mg | ORAL_TABLET | Freq: Every day | ORAL | Status: AC
  Administered 2019-01-24 – 2019-01-26 (×3): 6 mg via ORAL
  Filled 2019-01-23 (×3): qty 2

## 2019-01-23 NOTE — TOC Progression Note (Signed)
Transition of Care [facility]) - Progression Note    Patient Details  Name: KIERNEY ORECCHIO MRN: [id number] Date of Birth: [birth year]  [facility] CM/SW Contact  Lynora Dymond, Juliann Pulse, RN Phone Number: 01/23/2019, 10:40 AM  Clinical Narrative:  Per request TC [place] spoke to [facility] have no pharmacy today;Will need auth from CM to start tomorrow (insurance closed today), [facility] is able to accept tomorrow if Josem Kaufmann received-Kirsten will contact son Chrissie Noa [phone] with his concerns-Son has not accepted a facility yet.     Expected Discharge Plan: Assisted Living    Expected Discharge Plan and Services Expected Discharge Plan: Assisted Living   Discharge Planning Services: CM Consult   Living arrangements for the past 2 months: Assisted Living Facility                                       Social Determinants of Health (SDOH) Interventions    Readmission Risk Interventions No flowsheet data found.

## 2019-01-23 NOTE — Progress Notes (Signed)
PROGRESS NOTE    [name]  [id number] DOB: [birth year] DOA: 01/16/2019 PCP: Lajean Manes, MD    Brief Narrative:  Patient is 85 year old Caucasian female from assisted living facility with history of anemia, arthritis, chronic diarrhea from prior colon surgery, COPD, GERD, hypertension who presented to the hospital after being tested positive for COVID-19.  Patient was sent to the ER by her son, apparently her complaints were only sneezing.  In the emergency room she was found to be febrile with oxygen saturation intermittently dropping to mid to high 80s.  She was treated with steroids and remdesivir and has been weaned off the oxygen. Profound physical deconditioning.  Will need to go to rehab before returning home.   Assessment & Plan:   Principal Problem:   Pneumonia due to COVID-19 virus Active Problems:   Sepsis ([place])   Acute respiratory failure with hypoxia ([place])   Pulmonary nodule   Thoracic aortic aneurysm ([place])  Acute hypoxemic respiratory failure secondary to COVID-19 pneumonia, sepsis present on admission resolved: Clinically improved.  Afebrile. Finished remdesivir therapy. Remains on dexamethasone, will continue for total 10 days.  Change to oral dexamethasone today. Symptomatic treatment.  Hypoglycemia: In the setting of steroid use.  Hemoglobin A1c is 5.9.  No indication for treatment.  Hypertension: Blood pressures are stable.  She is on amlodipine at home.    Physical deconditioning: Patient will benefit with inpatient therapies before returning to assisted living facility.  Social worker on board, finding SNF.   DVT prophylaxis: Lovenox subcu Code Status: Full code Family Communication: None Disposition Plan: Skilled nursing facility when available.   Consultants:   None  Procedures:   None  Antimicrobials:   Remdesivir, 01/16/2019-01/21/2019   Subjective: Seen and examined.  No overnight events.  Afebrile.  She thinks it will be  acceptable to go to [facility].  Objective: Vitals:   01/22/19 0530 01/22/19 1240 01/22/19 2015 01/23/19 0458  BP: (!) 157/82 (!) 141/59 (!) 164/82 (!) 177/78  Pulse: (!) 52 84 86 81  Resp: 18 (!) 22 20 20   Temp: (!) 97.4 F (36.3 C) (!) 97.5 F (36.4 C) 98.5 F (36.9 C) 97.7 F (36.5 C)  TempSrc: Oral Oral Oral Oral  SpO2: 94% 96% 98% 98%  Weight:      Height:        Intake/Output Summary (Last 24 hours) at 01/23/2019 1112 Last data filed at 01/23/2019 0850 Gross per 24 hour  Intake 477 ml  Output 350 ml  Net 127 ml   Filed Weights   01/17/19 0644  Weight: 51.3 kg    Examination:  General exam: Appears calm and comfortable  Respiratory system: Clear to auscultation. Respiratory effort normal. Cardiovascular system: S1 & S2 heard, RRR. No JVD, murmurs, rubs, gallops or clicks. No pedal edema. Gastrointestinal system: Abdomen is nondistended, soft and nontender. No organomegaly or masses felt. Normal bowel sounds heard. Central nervous system: Alert and oriented. No focal neurological deficits. Extremities: Symmetric 5 x 5 power. Skin: No rashes, lesions or ulcers Psychiatry: Judgement and insight appear normal. Mood & affect appropriate.     Data Reviewed: I have personally reviewed following labs and imaging studies  CBC: Recent Labs  Lab 01/18/19 0524 01/19/19 0325 01/20/19 0424 01/21/19 0501 01/22/19 0425  WBC 4.7 5.6 4.6 7.8 9.0  NEUTROABS 3.3 4.0 3.4 5.6 6.2  HGB 10.4* 11.4* 10.8* 11.3* 10.8*  HCT 33.0* 36.0 34.1* 35.9* 33.5*  MCV 95.9 95.0 93.9 95.2 94.6  PLT 184 228  245 258 [id number]   Basic Metabolic Panel: Recent Labs  Lab 01/18/19 0524 01/19/19 0325 01/20/19 0424 01/21/19 0501 01/22/19 0425  NA 136 135 136 134* 133*  K 5.1 4.9 4.5 4.5 4.4  CL 103 98 100 99 99  CO2 25 27 26 25 26   GLUCOSE 102* 125* 126* 112* 102*  BUN 32* 37* 43* 46* 50*  CREATININE 0.90 0.82 1.09* 0.88 0.94  CALCIUM 9.0 8.9 8.6* 8.7* 8.4*  MG 1.9 2.0 2.1 2.1 2.3   PHOS 2.4* 2.7 3.8 3.3 3.7   GFR: Estimated Creatinine Clearance: 34.8 mL/min (by C-G formula based on SCr of 0.94 mg/dL). Liver Function Tests: Recent Labs  Lab 01/18/19 0524 01/19/19 0325 01/20/19 0424 01/21/19 0501 01/22/19 0425  AST 22 19 29  32 27  ALT 18 16 23 31 31   ALKPHOS 70 77 82 77 62  BILITOT 0.3 0.2* 0.2* 0.3 0.6  PROT 5.9* 6.1* 5.7* 5.7* 5.6*  ALBUMIN 2.7* 2.6* 2.5* 2.6* 2.6*   No results for input(s): LIPASE, AMYLASE in the last 168 hours. No results for input(s): AMMONIA in the last 168 hours. Coagulation Profile: No results for input(s): INR, PROTIME in the last 168 hours. Cardiac Enzymes: No results for input(s): CKTOTAL, CKMB, CKMBINDEX, TROPONINI in the last 168 hours. BNP (last 3 results) No results for input(s): PROBNP in the last 8760 hours. HbA1C: Recent Labs    01/22/19 0425  HGBA1C 5.9*   CBG: Recent Labs  Lab 01/22/19 0901 01/22/19 1237 01/22/19 1703 01/22/19 2011 01/23/19 0801  GLUCAP 103* 215* 154* 155* 86   Lipid Profile: No results for input(s): CHOL, HDL, LDLCALC, TRIG, CHOLHDL, LDLDIRECT in the last 72 hours. Thyroid Function Tests: No results for input(s): TSH, T4TOTAL, FREET4, T3FREE, THYROIDAB in the last 72 hours. Anemia Panel: Recent Labs    01/21/19 0501 01/22/19 0425  FERRITIN 136 132   Sepsis Labs: Recent Labs  Lab 01/16/19 2151 01/16/19 2351  PROCALCITON <0.10  --   LATICACIDVEN 2.9* 0.7    Recent Results (from the past 240 hour(s))  Blood Culture (routine x 2)     Status: None   Collection Time: 01/16/19  9:51 PM   Specimen: BLOOD  Result Value Ref Range Status   Specimen Description   Final    BLOOD LEFT ANTECUBITAL Performed at [facility], [employer] [address], [place], [place] [address]    Special Requests   Final    BOTTLES DRAWN AEROBIC AND ANAEROBIC Blood Culture adequate volume Performed at [facility] [address] [place], [place] [address]    Culture    Final    NO GROWTH 5 DAYS Performed at [facility], [place] [address] [place], [place] [address]    Report Status 01/21/2019 FINAL  Final  Blood Culture (routine x 2)     Status: None   Collection Time: 01/16/19  9:56 PM   Specimen: BLOOD  Result Value Ref Range Status   Specimen Description   Final    BLOOD RIGHT ANTECUBITAL Performed at [facility] [address] [place], [place] [address]    Special Requests   Final    BOTTLES DRAWN AEROBIC AND ANAEROBIC Blood Culture adequate volume Performed at [facility] [address], [place], [place] [address]    Culture   Final    NO GROWTH 5 DAYS Performed at [facility], [place] [address] [place],  [address]    Report Status 01/21/2019  FINAL  Final  SARS CORONAVIRUS 2 (TAT 6-24 HRS) Nasopharyngeal Nasopharyngeal Swab     Status: Abnormal   Collection Time: 01/17/19  1:13 AM   Specimen: Nasopharyngeal Swab  Result Value Ref Range Status   SARS Coronavirus 2 POSITIVE (A) NEGATIVE Final    Comment: RESULT CALLED TO, READ BACK BY AND VERIFIED WITH: Henrietta Hoover RN 15:25 01/17/19 ([email or web address]) (NOTE) SARS-CoV-2 target nucleic acids are DETECTED. The SARS-CoV-2 RNA is generally detectable in upper and lower respiratory specimens during the acute phase of infection. Positive results are indicative of active infection with SARS-CoV-2. Clinical  correlation with patient history and other diagnostic information is necessary to determine patient infection status. Positive results do  not rule out bacterial infection or co-infection with other viruses. The expected result is Negative. Fact Sheet for Patients: SugarRoll.be Fact Sheet for Healthcare Providers: https://www.woods-mathews.com/ This test is not yet approved or cleared by the [place] FDA and  has been authorized for detection and/or diagnosis of SARS-CoV-2 by FDA under an  Emergency Use Authorization (EUA). This EUA will remain  in effect (meaning this test can be used) fo r the duration of the COVID-19 declaration under Section 564(b)(1) of the Act, 21 U.S.C. section 360bbb-3(b)(1), unless the authorization is terminated or revoked sooner. Performed at [facility], [place] [address], [place], [place] [address]   MRSA PCR Screening     Status: None   Collection Time: 01/17/19  6:24 AM   Specimen: Nasopharyngeal  Result Value Ref Range Status   MRSA by PCR NEGATIVE NEGATIVE Final    Comment:        The GeneXpert MRSA Assay (FDA approved for NASAL specimens only), is one component of a comprehensive MRSA colonization surveillance program. It is not intended to diagnose MRSA infection nor to guide or monitor treatment for MRSA infections. Performed at [facility], [place] [address], [place], [place] [address]          Radiology Studies: Dg Chest Port 1 View  Result Date: 01/22/2019 CLINICAL DATA:  COVID-19. EXAM: PORTABLE CHEST 1 VIEW COMPARISON:  January 20, 2019. FINDINGS: Stable cardiomediastinal silhouette. No pneumothorax is noted. Right lung is clear. Minimal left basilar atelectasis is noted with possible small left pleural effusion. Status post left shoulder arthroplasty. IMPRESSION: Minimal left basilar atelectasis is noted with possible small left pleural effusion. No other abnormality seen in the chest. Electronically Signed   By: Marijo Conception M.D.   On: 01/22/2019 07:52        Scheduled Meds: . [START ON 01/24/2019] dexamethasone  6 mg Oral Daily  . diphenoxylate-atropine  1 tablet Oral Q8H  . enoxaparin (LOVENOX) injection  40 mg Subcutaneous Q0600  . glycopyrrolate  1 mg Oral Q8H  . Ipratropium-Albuterol  1 puff Inhalation BID  . mirabegron ER  50 mg Oral QHS  . multivitamin with minerals  1 tablet Oral Daily  . Ensure Max Protein  11 oz Oral TID PC  . vitamin C  500 mg Oral Daily  . zinc sulfate   220 mg Oral Daily   Continuous Infusions:   LOS: 6 days    Time spent: 25 minutes    Barb Merino, MD [facility] Pager [phone]

## 2019-01-24 LAB — GLUCOSE, CAPILLARY
Glucose-Capillary: 93 mg/dL (ref 70–99)
Glucose-Capillary: 124 mg/dL — ABNORMAL HIGH (ref 70–99)
Glucose-Capillary: 215 mg/dL — ABNORMAL HIGH (ref 70–99)
Glucose-Capillary: 215 mg/dL — ABNORMAL HIGH (ref 70–99)

## 2019-01-24 MED ORDER — TRAZODONE HCL 50 MG PO TABS
50.0000 mg | ORAL_TABLET | Freq: Once | ORAL | Status: AC
  Administered 2019-01-24: 50 mg via ORAL
  Filled 2019-01-24: qty 1

## 2019-01-24 MED ORDER — AMLODIPINE BESYLATE 10 MG PO TABS
10.0000 mg | ORAL_TABLET | Freq: Every day | ORAL | Status: DC
  Administered 2019-01-24 – 2019-01-28 (×5): 10 mg via ORAL
  Filled 2019-01-24 (×5): qty 1

## 2019-01-24 NOTE — Progress Notes (Signed)
PROGRESS NOTE    [name]  [id number] DOB: [birth year] DOA: 01/16/2019 PCP: Lajean Manes, MD    Brief Narrative:  Patient is 85 year old Caucasian female from assisted living facility with history of anemia, arthritis, chronic diarrhea from prior colon surgery, COPD, GERD, hypertension who presented to the hospital after being tested positive for COVID-19.  Patient was sent to the ER by her son. In the emergency room she was found to be febrile with oxygen saturation intermittently dropping to mid to high 80s.  She was treated with steroids and remdesivir and has been weaned off the oxygen. Profound physical deconditioning.  Will need to go to rehab before returning home.   Assessment & Plan:   Principal Problem:   Pneumonia due to COVID-19 virus Active Problems:   Sepsis ([place])   Acute respiratory failure with hypoxia ([place])   Pulmonary nodule   Thoracic aortic aneurysm ([place])  Acute hypoxemic respiratory failure secondary to COVID-19 pneumonia, sepsis present on admission resolved: Clinically improved.  Afebrile. Finished remdesivir therapy. Remains on dexamethasone, will continue for total 10 days.  Change to oral dexamethasone. Symptomatic treatment.  Hyperglycemia: In the setting of steroid use.  Hemoglobin A1c is 5.9.  No indication for treatment.  Hypertension: Blood pressures are elevated.  Resume her amlodipine from home.  Physical deconditioning: Patient will benefit with inpatient therapies before returning to assisted living facility.  Social worker on board, finding SNF.   DVT prophylaxis: Lovenox subcu Code Status: Full code Family Communication: None Disposition Plan: Skilled nursing facility when available.   Consultants:   None  Procedures:   None  Antimicrobials:   Remdesivir, 01/16/2019-01/21/2019   Subjective: No overnight events.  Denies any complaints.  Looking forward to go to a rehab.  Objective: Vitals:   01/23/19 1411 01/23/19  2020 01/24/19 0433 01/24/19 1337  BP: (!) 143/97 (!) 157/89 (!) 184/98 (!) 156/64  Pulse: 83 87 81 88  Resp: 16 16 16 15   Temp: 97.9 F (36.6 C) 98.5 F (36.9 C) 97.7 F (36.5 C) 98.4 F (36.9 C)  TempSrc: Oral Oral Oral Oral  SpO2: 96% 97% 97% 97%  Weight:      Height:        Intake/Output Summary (Last 24 hours) at 01/24/2019 1419 Last data filed at 01/23/2019 1857 Gross per 24 hour  Intake 240 ml  Output 400 ml  Net -160 ml   Filed Weights   01/17/19 0644  Weight: 51.3 kg    Examination:  General exam: Appears calm and comfortable  Respiratory system: Clear to auscultation. Respiratory effort normal. Cardiovascular system: S1 & S2 heard, RRR. No JVD, murmurs, rubs, gallops or clicks. No pedal edema. Gastrointestinal system: Abdomen is nondistended, soft and nontender. No organomegaly or masses felt. Normal bowel sounds heard. Central nervous system: Alert and oriented. No focal neurological deficits. Extremities: Symmetric 5 x 5 power. Skin: No rashes, lesions or ulcers Psychiatry: Judgement and insight appear normal. Mood & affect appropriate.     Data Reviewed: I have personally reviewed following labs and imaging studies  CBC: Recent Labs  Lab 01/18/19 0524 01/19/19 0325 01/20/19 0424 01/21/19 0501 01/22/19 0425  WBC 4.7 5.6 4.6 7.8 9.0  NEUTROABS 3.3 4.0 3.4 5.6 6.2  HGB 10.4* 11.4* 10.8* 11.3* 10.8*  HCT 33.0* 36.0 34.1* 35.9* 33.5*  MCV 95.9 95.0 93.9 95.2 94.6  PLT 184 228 245 258 Q000111Q   Basic Metabolic Panel: Recent Labs  Lab 01/18/19 0524 01/19/19 0325 01/20/19 0424 01/21/19 0501 01/22/19  0425  NA 136 135 136 134* 133*  K 5.1 4.9 4.5 4.5 4.4  CL 103 98 100 99 99  CO2 25 27 26 25 26   GLUCOSE 102* 125* 126* 112* 102*  BUN 32* 37* 43* 46* 50*  CREATININE 0.90 0.82 1.09* 0.88 0.94  CALCIUM 9.0 8.9 8.6* 8.7* 8.4*  MG 1.9 2.0 2.1 2.1 2.3  PHOS 2.4* 2.7 3.8 3.3 3.7   GFR: Estimated Creatinine Clearance: 34.8 mL/min (by C-G formula based  on SCr of 0.94 mg/dL). Liver Function Tests: Recent Labs  Lab 01/18/19 0524 01/19/19 0325 01/20/19 0424 01/21/19 0501 01/22/19 0425  AST 22 19 29  32 27  ALT 18 16 23 31 31   ALKPHOS 70 77 82 77 62  BILITOT 0.3 0.2* 0.2* 0.3 0.6  PROT 5.9* 6.1* 5.7* 5.7* 5.6*  ALBUMIN 2.7* 2.6* 2.5* 2.6* 2.6*   No results for input(s): LIPASE, AMYLASE in the last 168 hours. No results for input(s): AMMONIA in the last 168 hours. Coagulation Profile: No results for input(s): INR, PROTIME in the last 168 hours. Cardiac Enzymes: No results for input(s): CKTOTAL, CKMB, CKMBINDEX, TROPONINI in the last 168 hours. BNP (last 3 results) No results for input(s): PROBNP in the last 8760 hours. HbA1C: Recent Labs    01/22/19 0425  HGBA1C 5.9*   CBG: Recent Labs  Lab 01/22/19 2011 01/23/19 0801 01/23/19 1146 01/24/19 0757 01/24/19 1202  GLUCAP 155* 86 133* 93 124*   Lipid Profile: No results for input(s): CHOL, HDL, LDLCALC, TRIG, CHOLHDL, LDLDIRECT in the last 72 hours. Thyroid Function Tests: No results for input(s): TSH, T4TOTAL, FREET4, T3FREE, THYROIDAB in the last 72 hours. Anemia Panel: Recent Labs    01/22/19 0425  FERRITIN 132   Sepsis Labs: No results for input(s): PROCALCITON, LATICACIDVEN in the last 168 hours.  Recent Results (from the past 240 hour(s))  Blood Culture (routine x 2)     Status: None   Collection Time: 01/16/19  9:51 PM   Specimen: BLOOD  Result Value Ref Range Status   Specimen Description   Final    BLOOD LEFT ANTECUBITAL Performed at [facility] [address] [place], [place] [address]    Special Requests   Final    BOTTLES DRAWN AEROBIC AND ANAEROBIC Blood Culture adequate volume Performed at [facility] [address] [place], [place] [address]    Culture   Final    NO GROWTH 5 DAYS Performed at [facility], [place] [address], [place], [place] [address]    Report Status 01/21/2019 FINAL  Final   Blood Culture (routine x 2)     Status: None   Collection Time: 01/16/19  9:56 PM   Specimen: BLOOD  Result Value Ref Range Status   Specimen Description   Final    BLOOD RIGHT ANTECUBITAL Performed at [facility] [address], [place], [place] [address]    Special Requests   Final    BOTTLES DRAWN AEROBIC AND ANAEROBIC Blood Culture adequate volume Performed at [facility] [address] [place], [place] [address]    Culture   Final    NO GROWTH 5 DAYS Performed at [facility], [place] [address], [place], [place] [address]    Report Status 01/21/2019 FINAL  Final  SARS CORONAVIRUS 2 (TAT 6-24 HRS) Nasopharyngeal Nasopharyngeal Swab     Status: Abnormal   Collection Time: 01/17/19  1:13 AM   Specimen: Nasopharyngeal Swab  Result Value Ref Range Status   SARS Coronavirus 2 POSITIVE (A) NEGATIVE Final    Comment: RESULT CALLED TO, READ BACK BY AND VERIFIED WITH: Henrietta Hoover RN 15:25 01/17/19 ([email or web address]) (NOTE) SARS-CoV-2 target nucleic acids are DETECTED. The SARS-CoV-2 RNA is generally detectable in upper and lower respiratory specimens during the acute phase of infection. Positive results are indicative of active infection with SARS-CoV-2. Clinical  correlation with patient history and other diagnostic information is necessary to determine patient infection status. Positive results do  not rule out bacterial infection or co-infection with other viruses. The expected result is Negative. Fact Sheet for Patients: SugarRoll.be Fact Sheet for Healthcare Providers: https://www.woods-mathews.com/ This test is not yet approved or cleared by the [place] FDA and  has been authorized for detection and/or diagnosis of SARS-CoV-2 by FDA under an Emergency Use Authorization (EUA). This EUA will remain  in effect (meaning this test can be used) fo r the duration of the COVID-19 declaration under  Section 564(b)(1) of the Act, 21 U.S.C. section 360bbb-3(b)(1), unless the authorization is terminated or revoked sooner. Performed at [facility], [place] [address] [place], [place] [address]   MRSA PCR Screening     Status: None   Collection Time: 01/17/19  6:24 AM   Specimen: Nasopharyngeal  Result Value Ref Range Status   MRSA by PCR NEGATIVE NEGATIVE Final    Comment:        The GeneXpert MRSA Assay (FDA approved for NASAL specimens only), is one component of a comprehensive MRSA colonization surveillance program. It is not intended to diagnose MRSA infection nor to guide or monitor treatment for MRSA infections. Performed at [facility], [place] [address] [place], [place] [address]          Radiology Studies: No results found.      Scheduled Meds: . dexamethasone  6 mg Oral Daily  . diphenoxylate-atropine  1 tablet Oral Q8H  . enoxaparin (LOVENOX) injection  40 mg Subcutaneous Q0600  . glycopyrrolate  1 mg Oral Q8H  . Ipratropium-Albuterol  1 puff Inhalation BID  . mirabegron ER  50 mg Oral QHS  . multivitamin with minerals  1 tablet Oral Daily  . Ensure Max Protein  11 oz Oral TID PC  . senna-docusate  1 tablet Oral BID  . vitamin C  500 mg Oral Daily  . zinc sulfate  220 mg Oral Daily   Continuous Infusions:   LOS: 7 days    Time spent: 25 minutes    Barb Merino, MD [facility] Pager [phone]

## 2019-01-24 NOTE — Progress Notes (Signed)
BG 215. MD made aware. Will continue to monitor.

## 2019-01-24 NOTE — Progress Notes (Signed)
Occupational Therapy Treatment Patient Details Name: ANNTIONETTE IVANOV MRN: [id number] DOB: 1930/04/04 Today's Date: 01/24/2019    History of present illness 85 year old Caucasian female from [place] facility with a past medical history significant for but not limited to anemia, arthritis, chronic diarrhea from her prior Colon Surgery, COPD, GERD, hypertension and other comorbidities who presented to the hospital after being tested positive for COVID-19 disease   OT comments  Performed adl from EOB and level one theraband exercises. Pt wanted to return to bed to rest after this   Follow Up Recommendations  SNF    Equipment Recommendations  3 in 1 bedside commode    Recommendations for Other Services      Precautions / Restrictions Precautions Precautions: Fall Restrictions Weight Bearing Restrictions: No       Mobility Bed Mobility         Supine to sit: Supervision;HOB elevated Sit to supine: Supervision;HOB elevated      Transfers                      Balance                                           ADL either performed or assessed with clinical judgement   ADL       Grooming: Set up;Applying deodorant;Wash/dry hands;Wash/dry face   Upper Body Bathing: Set up   Lower Body Bathing: Minimal assistance   Upper Body Dressing : Minimal assistance(only to tie gown)                     General ADL Comments: performed ADL from EOB; pt did not want to get up to chair at this time     Vision       Perception     Praxis      Cognition Arousal/Alertness: Awake/alert Behavior During Therapy: [facility] for tasks assessed/performed Overall Cognitive Status: Within Functional Limits for tasks assessed                                          Exercises Exercises: Other exercises Other Exercises Other Exercises: issued level one theraband for L shoulder flexion and bil elbow flexion. Pt is unable to perform  with R shoulder due to pain  Encouraged AROM to tolerance   Shoulder Instructions       General Comments      Pertinent Vitals/ Pain       Pain Assessment: Faces Faces Pain Scale: Hurts little more Pain Location: R shoulder Pain Descriptors / Indicators: Sore Pain Intervention(s): (modified HEP)  Home Living                                          Prior Functioning/Environment              Frequency  Min 2X/week        Progress Toward Goals  OT Goals(current goals can now be found in the care plan section)  Progress towards OT goals: Progressing toward goals     Plan      Co-evaluation  AM-PAC OT "6 Clicks" Daily Activity     Outcome Measure   Help from another person eating meals?: None Help from another person taking care of personal grooming?: A Little Help from another person toileting, which includes using toliet, bedpan, or urinal?: A Little Help from another person bathing (including washing, rinsing, drying)?: A Little Help from another person to put on and taking off regular upper body clothing?: A Little Help from another person to put on and taking off regular lower body clothing?: A Lot 6 Click Score: 18    End of Session    OT Visit Diagnosis: Unsteadiness on feet (R26.81);Other abnormalities of gait and mobility (R26.89);Muscle weakness (generalized) (M62.81);History of falling (Z91.81)   Activity Tolerance Patient tolerated treatment well   Patient Left in bed;with call bell/phone within reach;with bed alarm set   Nurse Communication          Time: [id number] OT Time Calculation (min): 23 min  Charges: OT General Charges $OT Visit: 1 Visit OT Treatments $Self Care/Home Management : 8-22 mins $Therapeutic Exercise: 8-22 mins  Lesle Chris, OTR/L Acute Rehabilitation Services [phone] [facility] pager [phone] office 01/24/2019   Kabria Hetzer 01/24/2019, 11:28 AM

## 2019-01-24 NOTE — TOC Progression Note (Signed)
Transition of Care [facility]) - Progression Note    Patient Details  Name: [name] MRN: [id number] Date of Birth: 12-15-1930  [facility] CM/SW Contact  Purcell Mouton, RN Phone Number: 01/24/2019, 4:06 PM  Clinical Narrative:    Pt has bed at [facility]. Waiting for insurance auth.    Expected Discharge Plan: Assisted Living    Expected Discharge Plan and Services Expected Discharge Plan: Assisted Living   Discharge Planning Services: CM Consult   Living arrangements for the past 2 months: Assisted Living Facility                                       Social Determinants of Health (SDOH) Interventions    Readmission Risk Interventions No flowsheet data found.

## 2019-01-24 NOTE — Progress Notes (Signed)
Physical Therapy Treatment Patient Details Name: [name] MRN: [id number] DOB: [birth year] Today's Date: 01/24/2019    History of Present Illness 85 year old Caucasian female from [facility] facility with a past medical history significant for but not limited to anemia, arthritis, chronic diarrhea from her prior Colon Surgery, COPD, GERD, hypertension and other comorbidities who presented to the hospital after being tested positive for COVID-19 disease    PT Comments    General Comments: AxO x 3 reports "I can't sleep with that fan on all time" (HIPA  Filter) Assisted OOB to amb to bathroom.  General bed mobility comments: for safety. increased time.  General transfer comment: light assist to rise and steady and then uncontrolled stand to sit onto toilet.  Also required increased assist to rise from lower toilet level.  Light assist to maintain static standing balance donning brief as well as standing at sink to wash hands.General Gait Details: limited distance to and from bathroom [employer]).  Assisted with amb to and from bathroom with MinGuard?MIN Assist with caution during turns and backward steps to toilet. Pt will need ST Rehab at SNF prior to returning to ALF  Follow Up Recommendations  SNF     Equipment Recommendations  None recommended by PT    Recommendations for Other Services       Precautions / Restrictions Precautions Precautions: Fall Restrictions Weight Bearing Restrictions: No    Mobility  Bed Mobility Overal bed mobility: Needs Assistance Bed Mobility: Supine to Sit;Sit to Supine     Supine to sit: Supervision;HOB elevated Sit to supine: Supervision;HOB elevated   General bed mobility comments: for safety. increased time.  Transfers Overall transfer level: Needs assistance Equipment used: Rolling walker (2 wheeled) Transfers: Sit to/from [facility] Sit to Stand: Supervision;Min guard Stand pivot transfers: Min  guard;Min assist       General transfer comment: light assist to rise and steady and then uncontrolled stand to sit onto toilet.  Also required increased assist to rise from lower toilet level.  Light assist to maintain static standing balance donning brief as well as standing at sink to wash hands.  Ambulation/Gait Ambulation/Gait assistance: Min guard;Min assist Gait Distance (Feet): 22 Feet(11 feet x 2 to and from bathroom.) Assistive device: Rolling walker (2 wheeled) Gait Pattern/deviations: Step-through pattern;Decreased stride length Gait velocity: decreased   General Gait Details: limited distance to and from bathroom [employer]).  Assisted with amb to and from bathroom with MinGuard?MIN Assist with caution during turns and backward steps to toilet.   Stairs             Wheelchair Mobility    Modified Rankin (Stroke Patients Only)       Balance                                            Cognition Arousal/Alertness: Awake/alert Behavior During Therapy: WFL for tasks assessed/performed Overall Cognitive Status: Within Functional Limits for tasks assessed                                 General Comments: AxO x 3 reports "I can't sleep with that fan on all time" (HIPA  filter)      Exercises Other Exercises Other Exercises: issued level one theraband for L shoulder flexion and bil  elbow flexion. Pt is unable to perform with R shoulder due to pain  Encouraged AROM to tolerance    General Comments        Pertinent Vitals/Pain Pain Assessment: No/denies pain Faces Pain Scale: Hurts little more Pain Location: R shoulder Pain Descriptors / Indicators: Sore Pain Intervention(s): (modified HEP)    Home Living                      Prior Function            PT Goals (current goals can now be found in the care plan section)      Frequency    Min 3X/week      PT Plan Current plan remains  appropriate    Co-evaluation              AM-PAC PT "6 Clicks" Mobility   Outcome Measure    Help needed moving from lying on your back to sitting on the side of a flat bed without using bedrails?: A Little Help needed moving to and from a bed to a chair (including a wheelchair)?: A Little Help needed standing up from a chair using your arms (e.g., wheelchair or bedside chair)?: A Little Help needed to walk in hospital room?: A Little Help needed climbing 3-5 steps with a railing? : A Lot 6 Click Score: 14    End of Session Equipment Utilized During Treatment: Gait belt Activity Tolerance: Patient tolerated treatment well Patient left: in bed;with call bell/phone within reach;with bed alarm set   PT Visit Diagnosis: Unsteadiness on feet (R26.81);Muscle weakness (generalized) (M62.81);History of falling (Z91.81)     Time: [id number] PT Time Calculation (min) (ACUTE ONLY): 26 min  Charges:  $Gait Training: 8-22 mins $Therapeutic Activity: 8-22 mins                     Rica Koyanagi  PTA Acute  Rehabilitation Services Pager      [phone] Office      [phone]

## 2019-01-25 LAB — GLUCOSE, CAPILLARY
Glucose-Capillary: 86 mg/dL (ref 70–99)
Glucose-Capillary: 108 mg/dL — ABNORMAL HIGH (ref 70–99)
Glucose-Capillary: 143 mg/dL — ABNORMAL HIGH (ref 70–99)
Glucose-Capillary: 218 mg/dL — ABNORMAL HIGH (ref 70–99)

## 2019-01-25 MED ORDER — ZOLPIDEM TARTRATE 5 MG PO TABS
5.0000 mg | ORAL_TABLET | Freq: Every evening | ORAL | Status: DC | PRN
  Administered 2019-01-25 – 2019-01-27 (×3): 5 mg via ORAL
  Filled 2019-01-25 (×3): qty 1

## 2019-01-25 MED ORDER — IPRATROPIUM-ALBUTEROL 20-100 MCG/ACT IN AERS: 1.0000 | INHALATION_SPRAY | Freq: Four times a day (QID) | RESPIRATORY_TRACT | Status: DC | PRN

## 2019-01-25 NOTE — Progress Notes (Signed)
PROGRESS NOTE    [name]  [id number] DOB: 03/03/1930 DOA: 01/16/2019 PCP: Lajean Manes, MD    Brief Narrative:  Patient is 85 year old Caucasian female from assisted living facility with history of anemia, arthritis, chronic diarrhea from prior colon surgery, COPD, GERD, hypertension who presented to the hospital after being tested positive for COVID-19.  Patient was sent to the ER by her son. In the emergency room she was found to be febrile with oxygen saturation intermittently dropping to mid to high 80s.  She was treated with steroids and remdesivir and has been weaned off the oxygen. Profound physical deconditioning.  Will need to go to rehab before returning home.   Assessment & Plan:   Principal Problem:   Pneumonia due to COVID-19 virus Active Problems:   Sepsis ([place])   Acute respiratory failure with hypoxia ([place])   Pulmonary nodule   Thoracic aortic aneurysm ([place])  Acute hypoxemic respiratory failure secondary to COVID-19 pneumonia, sepsis present on admission resolved: Clinically improved.  Afebrile. Finished remdesivir therapy. Remains on dexamethasone, will continue for total 10 days.  Change to oral dexamethasone. Symptomatic treatment.  Hyperglycemia: In the setting of steroid use.  Hemoglobin A1c is 5.9.  No indication for treatment.  Hypertension: Blood pressures are elevated.  She was of antihypertensives in the past.  Resume amlodipine.    Physical deconditioning: Patient will benefit with inpatient therapies before returning to assisted living facility.  Social worker on board, waiting for SNF bed availability.   DVT prophylaxis: Lovenox subcu Code Status: Full code Family Communication: None Disposition Plan: Skilled nursing facility when available.   Consultants:   None  Procedures:   None  Antimicrobials:   Remdesivir, 01/16/2019-01/21/2019   Subjective: No overnight events.  Patient is very anxious that she did not get good sleep  last night.  Trazodone did not work.  She was wondering whether we can turn of the exhaust fan.  Objective: Vitals:   01/24/19 2008 01/25/19 0516 01/25/19 0837 01/25/19 1052  BP: (!) 170/86 (!) 187/95 (!) 179/85 (!) 146/85  Pulse: 87 78 78 75  Resp: 18 18    Temp: 98.6 F (37 C) 97.8 F (36.6 C)    TempSrc: Oral Oral    SpO2: 97% 98%    Weight:      Height:        Intake/Output Summary (Last 24 hours) at 01/25/2019 1134 Last data filed at 01/25/2019 0841 Gross per 24 hour  Intake -  Output 900 ml  Net -900 ml   Filed Weights   01/17/19 0644  Weight: 51.3 kg    Examination:  General exam: Appears calm and comfortable  Respiratory system: Clear to auscultation. Respiratory effort normal. Cardiovascular system: S1 & S2 heard, RRR. No JVD, murmurs, rubs, gallops or clicks. No pedal edema. Gastrointestinal system: Abdomen is nondistended, soft and nontender. No organomegaly or masses felt. Normal bowel sounds heard. Central nervous system: Alert and oriented. No focal neurological deficits. Extremities: Symmetric 5 x 5 power. Skin: No rashes, lesions or ulcers Psychiatry: Judgement and insight appear normal. Mood & affect anxious    Data Reviewed: I have personally reviewed following labs and imaging studies  CBC: Recent Labs  Lab 01/19/19 0325 01/20/19 0424 01/21/19 0501 01/22/19 0425  WBC 5.6 4.6 7.8 9.0  NEUTROABS 4.0 3.4 5.6 6.2  HGB 11.4* 10.8* 11.3* 10.8*  HCT 36.0 34.1* 35.9* 33.5*  MCV 95.0 93.9 95.2 94.6  PLT 228 245 258 Q000111Q   Basic Metabolic Panel:  Recent Labs  Lab 01/19/19 0325 01/20/19 0424 01/21/19 0501 01/22/19 0425  NA 135 136 134* 133*  K 4.9 4.5 4.5 4.4  CL 98 100 99 99  CO2 27 26 25 26   GLUCOSE 125* 126* 112* 102*  BUN 37* 43* 46* 50*  CREATININE 0.82 1.09* 0.88 0.94  CALCIUM 8.9 8.6* 8.7* 8.4*  MG 2.0 2.1 2.1 2.3  PHOS 2.7 3.8 3.3 3.7   GFR: Estimated Creatinine Clearance: 34.8 mL/min (by C-G formula based on SCr of 0.94  mg/dL). Liver Function Tests: Recent Labs  Lab 01/19/19 0325 01/20/19 0424 01/21/19 0501 01/22/19 0425  AST 19 29 32 27  ALT 16 23 31 31   ALKPHOS 77 82 77 62  BILITOT 0.2* 0.2* 0.3 0.6  PROT 6.1* 5.7* 5.7* 5.6*  ALBUMIN 2.6* 2.5* 2.6* 2.6*   No results for input(s): LIPASE, AMYLASE in the last 168 hours. No results for input(s): AMMONIA in the last 168 hours. Coagulation Profile: No results for input(s): INR, PROTIME in the last 168 hours. Cardiac Enzymes: No results for input(s): CKTOTAL, CKMB, CKMBINDEX, TROPONINI in the last 168 hours. BNP (last 3 results) No results for input(s): PROBNP in the last 8760 hours. HbA1C: No results for input(s): HGBA1C in the last 72 hours. CBG: Recent Labs  Lab 01/24/19 0757 01/24/19 1202 01/24/19 1714 01/24/19 2005 01/25/19 0859  GLUCAP 93 124* 215* 215* 86   Lipid Profile: No results for input(s): CHOL, HDL, LDLCALC, TRIG, CHOLHDL, LDLDIRECT in the last 72 hours. Thyroid Function Tests: No results for input(s): TSH, T4TOTAL, FREET4, T3FREE, THYROIDAB in the last 72 hours. Anemia Panel: No results for input(s): VITAMINB12, FOLATE, FERRITIN, TIBC, IRON, RETICCTPCT in the last 72 hours. Sepsis Labs: No results for input(s): PROCALCITON, LATICACIDVEN in the last 168 hours.  Recent Results (from the past 240 hour(s))  Blood Culture (routine x 2)     Status: None   Collection Time: 01/16/19  9:51 PM   Specimen: BLOOD  Result Value Ref Range Status   Specimen Description   Final    BLOOD LEFT ANTECUBITAL Performed at [facility] [address], [place], [place] [address]    Special Requests   Final    BOTTLES DRAWN AEROBIC AND ANAEROBIC Blood Culture adequate volume Performed at [facility] [address] [place], [place] [address]    Culture   Final    NO GROWTH 5 DAYS Performed at [facility], [place] [address] [place], [place] [address]    Report Status 01/21/2019 FINAL  Final   Blood Culture (routine x 2)     Status: None   Collection Time: 01/16/19  9:56 PM   Specimen: BLOOD  Result Value Ref Range Status   Specimen Description   Final    BLOOD RIGHT ANTECUBITAL Performed at [facility] [address] [place], [place] [address]    Special Requests   Final    BOTTLES DRAWN AEROBIC AND ANAEROBIC Blood Culture adequate volume Performed at [facility] [address] [place], [place] [address]    Culture   Final    NO GROWTH 5 DAYS Performed at [facility], [place] [address] [place], [place] [address]    Report Status 01/21/2019 FINAL  Final  SARS CORONAVIRUS 2 (TAT 6-24 HRS) Nasopharyngeal Nasopharyngeal Swab     Status: Abnormal   Collection Time: 01/17/19  1:13 AM   Specimen: Nasopharyngeal Swab  Result Value Ref Range Status  SARS Coronavirus 2 POSITIVE (A) NEGATIVE Final    Comment: RESULT CALLED TO, READ BACK BY AND VERIFIED WITH: Henrietta Hoover RN 15:25 01/17/19 ([email or web address]) (NOTE) SARS-CoV-2 target nucleic acids are DETECTED. The SARS-CoV-2 RNA is generally detectable in upper and lower respiratory specimens during the acute phase of infection. Positive results are indicative of active infection with SARS-CoV-2. Clinical  correlation with patient history and other diagnostic information is necessary to determine patient infection status. Positive results do  not rule out bacterial infection or co-infection with other viruses. The expected result is Negative. Fact Sheet for Patients: SugarRoll.be Fact Sheet for Healthcare Providers: https://www.woods-mathews.com/ This test is not yet approved or cleared by the [place] FDA and  has been authorized for detection and/or diagnosis of SARS-CoV-2 by FDA under an Emergency Use Authorization (EUA). This EUA will remain  in effect (meaning this test can be used) fo r the duration of the COVID-19 declaration under  Section 564(b)(1) of the Act, 21 U.S.C. section 360bbb-3(b)(1), unless the authorization is terminated or revoked sooner. Performed at [facility], [place] [address], [place], [place] [address]   MRSA PCR Screening     Status: None   Collection Time: 01/17/19  6:24 AM   Specimen: Nasopharyngeal  Result Value Ref Range Status   MRSA by PCR NEGATIVE NEGATIVE Final    Comment:        The GeneXpert MRSA Assay (FDA approved for NASAL specimens only), is one component of a comprehensive MRSA colonization surveillance program. It is not intended to diagnose MRSA infection nor to guide or monitor treatment for MRSA infections. Performed at [facility], [place] [address], [place], [place] [address]          Radiology Studies: No results found.      Scheduled Meds: . amLODipine  10 mg Oral Daily  . dexamethasone  6 mg Oral Daily  . diphenoxylate-atropine  1 tablet Oral Q8H  . enoxaparin (LOVENOX) injection  40 mg Subcutaneous Q0600  . glycopyrrolate  1 mg Oral Q8H  . Ipratropium-Albuterol  1 puff Inhalation BID  . mirabegron ER  50 mg Oral QHS  . multivitamin with minerals  1 tablet Oral Daily  . Ensure Max Protein  11 oz Oral TID PC  . senna-docusate  1 tablet Oral BID  . vitamin C  500 mg Oral Daily  . zinc sulfate  220 mg Oral Daily   Continuous Infusions:   LOS: 8 days    Time spent: 25 minutes    Barb Merino, MD [facility] Pager [phone]

## 2019-01-26 LAB — GLUCOSE, CAPILLARY
Glucose-Capillary: 101 mg/dL — ABNORMAL HIGH (ref 70–99)
Glucose-Capillary: 138 mg/dL — ABNORMAL HIGH (ref 70–99)
Glucose-Capillary: 158 mg/dL — ABNORMAL HIGH (ref 70–99)
Glucose-Capillary: 178 mg/dL — ABNORMAL HIGH (ref 70–99)

## 2019-01-26 NOTE — Progress Notes (Signed)
PROGRESS NOTE    [name]  [id number] DOB: March 12, 1930 DOA: 01/16/2019 PCP: Lajean Manes, MD    Brief Narrative:  Patient is 85 year old Caucasian female from assisted living facility with history of anemia, arthritis, chronic diarrhea from prior colon surgery, COPD, GERD, hypertension who presented to the hospital after being tested positive for COVID-19.  Patient was sent to the ER by her son. In the emergency room she was found to be febrile with oxygen saturation intermittently dropping to mid to high 80s.  She was treated with steroids and remdesivir and has been weaned off the oxygen. Profound physical deconditioning.  Will need to go to rehab before returning home.   Assessment & Plan:   Principal Problem:   Pneumonia due to COVID-19 virus Active Problems:   Sepsis ([place])   Acute respiratory failure with hypoxia ([place])   Pulmonary nodule   Thoracic aortic aneurysm ([place])  Acute hypoxemic respiratory failure secondary to COVID-19 pneumonia, sepsis present on admission resolved: Clinically improved.  Afebrile. Finished remdesivir therapy. Finished dexamethasone for 10 days.  Symptomatic treatment. Asymptomatic today.  Hyperglycemia: In the setting of steroid use.  Hemoglobin A1c is 5.9.  No indication for treatment.  Hypertension: Blood pressures are elevated.  She was off antihypertensives in the past.  Resumed amlodipine.    Physical deconditioning: Patient will benefit with inpatient therapies before returning to assisted living facility.  Social worker on board, waiting for SNF bed availability.   DVT prophylaxis: Lovenox subcu Code Status: Full code Family Communication: None Disposition Plan: Skilled nursing facility when available.  Waiting for insurance prior authorization.   Consultants:   None  Procedures:   None  Antimicrobials:   Remdesivir, 01/16/2019-01/21/2019   Subjective: No overnight events.  Looking forward to go to  rehab.  Objective: Vitals:   01/26/19 0621 01/26/19 0858 01/26/19 1058 01/26/19 1111  BP: (!) 172/89 (!) 176/99 (!) 157/82 (!) 157/82  Pulse: 83 92 90 94  Resp: 16   16  Temp: 98 F (36.7 C)   97.6 F (36.4 C)  TempSrc: Oral   Oral  SpO2: 98%   99%  Weight:      Height:        Intake/Output Summary (Last 24 hours) at 01/26/2019 1320 Last data filed at 01/26/2019 1112 Gross per 24 hour  Intake 750 ml  Output -  Net 750 ml   Filed Weights   01/17/19 0644  Weight: 51.3 kg    Examination:  General exam: Appears calm and comfortable  Respiratory system: Clear to auscultation. Respiratory effort normal. Cardiovascular system: S1 & S2 heard, RRR. No JVD, murmurs, rubs, gallops or clicks. No pedal edema. Gastrointestinal system: Abdomen is nondistended, soft and nontender. No organomegaly or masses felt. Normal bowel sounds heard. Central nervous system: Alert and oriented. No focal neurological deficits. Extremities: Symmetric 5 x 5 power. Skin: No rashes, lesions or ulcers Psychiatry: Judgement and insight appear normal. Mood & affect normal.    Data Reviewed: I have personally reviewed following labs and imaging studies  CBC: Recent Labs  Lab 01/20/19 0424 01/21/19 0501 01/22/19 0425  WBC 4.6 7.8 9.0  NEUTROABS 3.4 5.6 6.2  HGB 10.8* 11.3* 10.8*  HCT 34.1* 35.9* 33.5*  MCV 93.9 95.2 94.6  PLT 245 258 Q000111Q   Basic Metabolic Panel: Recent Labs  Lab 01/20/19 0424 01/21/19 0501 01/22/19 0425  NA 136 134* 133*  K 4.5 4.5 4.4  CL 100 99 99  CO2 26 25 26   GLUCOSE  126* 112* 102*  BUN 43* 46* 50*  CREATININE 1.09* 0.88 0.94  CALCIUM 8.6* 8.7* 8.4*  MG 2.1 2.1 2.3  PHOS 3.8 3.3 3.7   GFR: Estimated Creatinine Clearance: 34.8 mL/min (by C-G formula based on SCr of 0.94 mg/dL). Liver Function Tests: Recent Labs  Lab 01/20/19 0424 01/21/19 0501 01/22/19 0425  AST 29 32 27  ALT 23 31 31   ALKPHOS 82 77 62  BILITOT 0.2* 0.3 0.6  PROT 5.7* 5.7* 5.6*   ALBUMIN 2.5* 2.6* 2.6*   No results for input(s): LIPASE, AMYLASE in the last 168 hours. No results for input(s): AMMONIA in the last 168 hours. Coagulation Profile: No results for input(s): INR, PROTIME in the last 168 hours. Cardiac Enzymes: No results for input(s): CKTOTAL, CKMB, CKMBINDEX, TROPONINI in the last 168 hours. BNP (last 3 results) No results for input(s): PROBNP in the last 8760 hours. HbA1C: No results for input(s): HGBA1C in the last 72 hours. CBG: Recent Labs  Lab 01/25/19 1136 01/25/19 1631 01/25/19 1956 01/26/19 0845 01/26/19 1109  GLUCAP 108* 143* 218* 101* 138*   Lipid Profile: No results for input(s): CHOL, HDL, LDLCALC, TRIG, CHOLHDL, LDLDIRECT in the last 72 hours. Thyroid Function Tests: No results for input(s): TSH, T4TOTAL, FREET4, T3FREE, THYROIDAB in the last 72 hours. Anemia Panel: No results for input(s): VITAMINB12, FOLATE, FERRITIN, TIBC, IRON, RETICCTPCT in the last 72 hours. Sepsis Labs: No results for input(s): PROCALCITON, LATICACIDVEN in the last 168 hours.  Recent Results (from the past 240 hour(s))  Blood Culture (routine x 2)     Status: None   Collection Time: 01/16/19  9:51 PM   Specimen: BLOOD  Result Value Ref Range Status   Specimen Description   Final    BLOOD LEFT ANTECUBITAL Performed at [facility] [address], [place], [place] [address]    Special Requests   Final    BOTTLES DRAWN AEROBIC AND ANAEROBIC Blood Culture adequate volume Performed at [facility] [address] [place], [place] [address]    Culture   Final    NO GROWTH 5 DAYS Performed at [facility], [place] [address] [place], [place] [address]    Report Status 01/21/2019 FINAL  Final  Blood Culture (routine x 2)     Status: None   Collection Time: 01/16/19  9:56 PM   Specimen: BLOOD  Result Value Ref Range Status   Specimen Description   Final    BLOOD RIGHT ANTECUBITAL Performed at [facility] [address] [place], [place] [address]    Special Requests   Final    BOTTLES DRAWN AEROBIC AND ANAEROBIC Blood Culture adequate volume Performed at [facility] [address] [place], [place] [address]    Culture   Final    NO GROWTH 5 DAYS Performed at [facility], [place] [address] [place], [place] [address]    Report Status 01/21/2019 FINAL  Final  SARS CORONAVIRUS 2 (TAT 6-24 HRS) Nasopharyngeal Nasopharyngeal Swab     Status: Abnormal   Collection Time: 01/17/19  1:13 AM   Specimen: Nasopharyngeal Swab  Result Value Ref Range Status   SARS Coronavirus 2 POSITIVE (A) NEGATIVE Final    Comment: RESULT CALLED TO, READ BACK BY AND VERIFIED WITH: Henrietta Hoover RN 15:25 01/17/19 ([email or web address]) (NOTE) SARS-CoV-2 target nucleic acids are DETECTED. The SARS-CoV-2 RNA is generally detectable in upper and lower respiratory specimens during the acute phase of infection.  Positive results are indicative of active infection with SARS-CoV-2. Clinical  correlation with patient history and other diagnostic information is necessary to determine patient infection status. Positive results do  not rule out bacterial infection or co-infection with other viruses. The expected result is Negative. Fact Sheet for Patients: SugarRoll.be Fact Sheet for Healthcare Providers: https://www.woods-mathews.com/ This test is not yet approved or cleared by the [place] FDA and  has been authorized for detection and/or diagnosis of SARS-CoV-2 by FDA under an Emergency Use Authorization (EUA). This EUA will remain  in effect (meaning this test can be used) fo r the duration of the COVID-19 declaration under Section 564(b)(1) of the Act, 21 U.S.C. section 360bbb-3(b)(1), unless the authorization is terminated or revoked sooner. Performed at [facility], [place] [address] [place], [place] [address]   MRSA PCR  Screening     Status: None   Collection Time: 01/17/19  6:24 AM   Specimen: Nasopharyngeal  Result Value Ref Range Status   MRSA by PCR NEGATIVE NEGATIVE Final    Comment:        The GeneXpert MRSA Assay (FDA approved for NASAL specimens only), is one component of a comprehensive MRSA colonization surveillance program. It is not intended to diagnose MRSA infection nor to guide or monitor treatment for MRSA infections. Performed at [facility], [place] [address], [place],  [address]          Radiology Studies: No results found.      Scheduled Meds: . amLODipine  10 mg Oral Daily  . diphenoxylate-atropine  1 tablet Oral Q8H  . enoxaparin (LOVENOX) injection  40 mg Subcutaneous Q0600  . glycopyrrolate  1 mg Oral Q8H  . mirabegron ER  50 mg Oral QHS  . multivitamin with minerals  1 tablet Oral Daily  . Ensure Max Protein  11 oz Oral TID PC  . senna-docusate  1 tablet Oral BID  . vitamin C  500 mg Oral Daily  . zinc sulfate  220 mg Oral Daily   Continuous Infusions:   LOS: 9 days    Time spent: 25 minutes    Barb Merino, MD [facility] Pager [phone]

## 2019-01-26 NOTE — Progress Notes (Signed)
Occupational Therapy Treatment Patient Details Name: [name] MRN: [id number] DOB: 1930/11/08 Today's Date: 01/26/2019    History of present illness 85 year old Caucasian female from [facility] facility with a past medical history significant for but not limited to anemia, arthritis, chronic diarrhea from her prior Colon Surgery, COPD, GERD, hypertension and other comorbidities who presented to the hospital after being tested positive for COVID-19 disease   OT comments  Pt performed toileting, LB adls, standing grooming and walked around room twice.  Steadiness and activity tolerance are improving  Follow Up Recommendations  SNF    Equipment Recommendations  3 in 1 bedside commode    Recommendations for Other Services      Precautions / Restrictions Precautions Precautions: Fall Restrictions Weight Bearing Restrictions: No       Mobility Bed Mobility               General bed mobility comments: supervision for in/out of bed  Transfers   Equipment used: Rolling walker (2 wheeled)     Stand pivot transfers: Min guard;Min assist       General transfer comment: for safety from bed and min A from 3:1 commode, slightly lower    Balance                                           ADL either performed or assessed with clinical judgement   ADL   Eating/Feeding: Set up   Grooming: Oral care;Supervision/safety;Standing       Lower Body Bathing: Minimal assistance       Lower Body Dressing: Maximal assistance   Toilet Transfer: Minimal assistance;Comfort height toilet;RW           Functional mobility during ADLs: Minimal assistance;Rolling walker General ADL Comments: Pt incontinent x bowel.  Used BSC, performed LB bathing/dressing (changed underwear and socks), stood for oral care and walked around room twice.     Vision       Perception     Praxis      Cognition Arousal/Alertness: Awake/alert Behavior During Therapy:  WFL for tasks assessed/performed Overall Cognitive Status: Within Functional Limits for tasks assessed                                          Exercises     Shoulder Instructions       General Comments      Pertinent Vitals/ Pain       Pain Assessment: No/denies pain  Home Living                                          Prior Functioning/Environment              Frequency  Min 2X/week        Progress Toward Goals  OT Goals(current goals can now be found in the care plan section)  Progress towards OT goals: Progressing toward goals     Plan      Co-evaluation                 AM-PAC OT "6 Clicks" Daily Activity     Outcome Measure   Help from another person  eating meals?: None Help from another person taking care of personal grooming?: A Little Help from another person toileting, which includes using toliet, bedpan, or urinal?: A Little Help from another person bathing (including washing, rinsing, drying)?: A Little Help from another person to put on and taking off regular upper body clothing?: A Little Help from another person to put on and taking off regular lower body clothing?: A Lot 6 Click Score: 18    End of Session    OT Visit Diagnosis: Unsteadiness on feet (R26.81);Other abnormalities of gait and mobility (R26.89);Muscle weakness (generalized) (M62.81);History of falling (Z91.81)   Activity Tolerance Patient tolerated treatment well   Patient Left in bed;with call bell/phone within reach;with bed alarm set   Nurse Communication          Time: [id number] OT Time Calculation (min): 42 min  Charges: OT General Charges $OT Visit: 1 Visit OT Treatments $Self Care/Home Management : 23-37 mins $Therapeutic Activity: 8-22 mins  Lesle Chris, OTR/L Acute Rehabilitation Services [phone] [place] pager [phone] office 01/26/2019   [place] 01/26/2019, 4:47 PM

## 2019-01-27 LAB — GLUCOSE, CAPILLARY
Glucose-Capillary: 185 mg/dL — ABNORMAL HIGH (ref 70–99)
Glucose-Capillary: 98 mg/dL (ref 70–99)
Glucose-Capillary: 96 mg/dL (ref 70–99)
Glucose-Capillary: 104 mg/dL — ABNORMAL HIGH (ref 70–99)

## 2019-01-27 MED ORDER — ASCORBIC ACID 500 MG PO TABS: 500.0000 mg | ORAL_TABLET | Freq: Every day | ORAL | 0 refills | Status: AC

## 2019-01-27 MED ORDER — ZINC SULFATE 220 (50 ZN) MG PO CAPS: 220.0000 mg | ORAL_CAPSULE | Freq: Every day | ORAL | 0 refills | Status: AC

## 2019-01-27 MED ORDER — ALBUTEROL SULFATE HFA 108 (90 BASE) MCG/ACT IN AERS: 2.0000 | INHALATION_SPRAY | RESPIRATORY_TRACT | 0 refills | Status: DC | PRN

## 2019-01-27 MED ORDER — GUAIFENESIN-DM 100-10 MG/5ML PO SYRP: 10.0000 mL | ORAL_SOLUTION | ORAL | 0 refills | Status: DC | PRN

## 2019-01-27 NOTE — Progress Notes (Signed)
Physical Therapy Treatment Patient Details Name: [name] MRN: [id number] DOB: 1930/03/06 Today's Date: 01/27/2019    History of Present Illness 85 year old Caucasian female from [facility] facility with a past medical history significant for but not limited to anemia, arthritis, chronic diarrhea from her prior Colon Surgery, COPD, GERD, hypertension and other comorbidities who presented to the hospital after being tested positive for COVID-19 disease    PT Comments    Pt first urinated with pure wick over 1000 cc (RN notified)!  Pt then assisted with ambulating in room.  Pt reports generalized weakness and fatigue however happy to be up ambulating in room.  Pt denies SOB and SPO2 remained in upper 90s on room air during session.  Pt plans to return to SNF upon d/c.  If d/c back to ILF, pt would benefit from aide and home health services.   Follow Up Recommendations  SNF     Equipment Recommendations  None recommended by PT    Recommendations for Other Services       Precautions / Restrictions Precautions Precautions: Fall    Mobility  Bed Mobility Overal bed mobility: Needs Assistance Bed Mobility: Supine to Sit     Supine to sit: Supervision        Transfers Overall transfer level: Needs assistance Equipment used: Rolling walker (2 wheeled) Transfers: Sit to/from Stand Sit to Stand: Min guard;Supervision         General transfer comment: pt did not require physical assist, min/guard to supervision for safety  Ambulation/Gait Ambulation/Gait assistance: Min guard Gait Distance (Feet): 30 Feet(x3) Assistive device: Rolling walker (2 wheeled) Gait Pattern/deviations: Step-through pattern;Decreased stride length Gait velocity: decreased   General Gait Details: pt ambulated around room and steady with RW   Stairs             Wheelchair Mobility    Modified Rankin (Stroke Patients Only)       Balance Overall balance assessment: Needs  assistance         Standing balance support: Bilateral upper extremity supported Standing balance-Leahy Scale: Poor Standing balance comment: Reliant on UE support                            Cognition Arousal/Alertness: Awake/alert Behavior During Therapy: WFL for tasks assessed/performed Overall Cognitive Status: Within Functional Limits for tasks assessed                                        Exercises      General Comments General comments (skin integrity, edema, etc.): SpO2 96% on room air at rest and 100% on room air after ambulating      Pertinent Vitals/Pain Pain Assessment: Faces Faces Pain Scale: Hurts a little bit Pain Location: R shoulder (chronic) Pain Descriptors / Indicators: Sore Pain Intervention(s): Repositioned;Monitored during session    Home Living                      Prior Function            PT Goals (current goals can now be found in the care plan section) Progress towards PT goals: Progressing toward goals    Frequency    Min 3X/week      PT Plan Current plan remains appropriate    Co-evaluation  AM-PAC PT "6 Clicks" Mobility   Outcome Measure  Help needed turning from your back to your side while in a flat bed without using bedrails?: A Little Help needed moving from lying on your back to sitting on the side of a flat bed without using bedrails?: A Little Help needed moving to and from a bed to a chair (including a wheelchair)?: A Little Help needed standing up from a chair using your arms (e.g., wheelchair or bedside chair)?: A Little Help needed to walk in hospital room?: A Little Help needed climbing 3-5 steps with a railing? : A Lot 6 Click Score: 17    End of Session Equipment Utilized During Treatment: Gait belt Activity Tolerance: Patient tolerated treatment well Patient left: with call bell/phone within reach;in chair;with chair alarm set   PT Visit Diagnosis:  Unsteadiness on feet (R26.81);Muscle weakness (generalized) (M62.81);History of falling (Z91.81)     Time: [id number] PT Time Calculation (min) (ACUTE ONLY): 39 min  Charges:  $Gait Training: 8-22 mins $Therapeutic Activity: 8-22 mins                    Carmelia Bake, PT, DPT Acute Rehabilitation Services Office: [phone] Pager: [phone]  Trena Platt 01/27/2019, 1:34 PM

## 2019-01-27 NOTE — Care Management Important Message (Signed)
Important Message  Patient Details IM Letter given to [name] RN to presen to the Patient Name: [name] MRN: [id number] Date of Birth: April 02, 1930   Medicare Important Message Given:  Yes     Kerin Salen 01/27/2019, 12:26 PM

## 2019-01-27 NOTE — Discharge Summary (Signed)
Physician Discharge Summary  [name] [id number] DOB: 30-May-1930 DOA: 01/16/2019  PCP: Lajean Manes, MD  Admit date: 01/16/2019  Discharge date: 01/27/2019  Admitted From:Home  Disposition:  SNF  Recommendations for Outpatient Follow-up:  1. Follow up with PCP in 1-2 weeks  Home Health:None  Equipment/Devices:None  Discharge Condition:Stable  CODE STATUS: Full  Diet recommendation: Heart Healthy  Brief/Interim Summary: Patient is 85 year old Caucasian female from assisted living facility with history of anemia, arthritis, chronic diarrhea from prior colon surgery, COPD, GERD, hypertension who presented to the hospital after being tested positive for COVID-19.  Patient was sent to the ER by her son. In the emergency room she was found to be febrile with oxygen saturation intermittently dropping to mid to high 80s.  She was treated with steroids and remdesivir and has been weaned off the oxygen. Profound physical deconditioning.  Will need to go to rehab today on DC.  Acute hypoxemic respiratory failure secondary to COVID-19 pneumonia, sepsis present on admission resolved: Clinically improved.  Afebrile. Finished remdesivir therapy. Finished dexamethasone for 10 days.  Symptomatic treatment. Asymptomatic today.  Hyperglycemia: In the setting of steroid use.  Hemoglobin A1c is 5.9.  No indication for treatment.  Hypertension: Blood pressures are elevated.  She was off antihypertensives in the past.  Resumed amlodipine.    Physical deconditioning: Will benefit from SNF/Rehab.  Discharge Diagnoses:  Principal Problem:   Pneumonia due to COVID-19 virus Active Problems:   Sepsis ([place])   Acute respiratory failure with hypoxia [facility])   Pulmonary nodule   Thoracic aortic aneurysm [facility])    Discharge Instructions  Discharge Instructions    Diet - low sodium heart healthy   Complete by: As directed    Increase activity slowly   Complete by: As directed       Allergies as of 01/27/2019      Reactions   Hydromorphone    Pt does not remember   Lisinopril    fatigue   Mirtazapine    Pt doesn't remember.     Nitrofuran Derivatives Diarrhea   Septra [bactrim]    Pt doesn't remember.    Sulfamethoxazole-trimethoprim       Medication List    TAKE these medications   albuterol 108 (90 Base) MCG/ACT inhaler Commonly known as: VENTOLIN HFA Inhale 2-4 puffs into the lungs every 4 (four) hours as needed for wheezing or shortness of breath.   amLODipine 10 MG tablet Commonly known as: NORVASC Take 1 tablet (10 mg total) by mouth daily.   ascorbic acid 500 MG tablet Commonly known as: VITAMIN C Take 1 tablet (500 mg total) by mouth daily. Start taking on: January 28, 2019   diphenoxylate-atropine 2.5-0.025 MG tablet Commonly known as: LOMOTIL Take 1 tablet by mouth 3 (three) times daily before meals.   glycopyrrolate 1 MG tablet Commonly known as: ROBINUL Take 1 tablet (1 mg total) by mouth 3 (three) times daily before meals.   guaiFENesin-dextromethorphan 100-10 MG/5ML syrup Commonly known as: ROBITUSSIN DM Take 10 mLs by mouth every 4 (four) hours as needed for cough.   multivitamin with minerals Tabs tablet Take 1 tablet by mouth daily.   Myrbetriq 50 MG Tb24 tablet Generic drug: mirabegron ER Take 50 mg by mouth at bedtime.   Ensure Plus Liqd Take 237 mLs by mouth 3 (three) times daily with meals.   nutrition supplement (JUVEN) Pack Take 1 packet by mouth 2 (two) times daily between meals.   zinc sulfate 220 (50 Zn) MG  capsule Take 1 capsule (220 mg total) by mouth daily. Start taking on: January 28, 2019      Follow-up Information    Stoneking, Hal, MD Follow up in 1 week(s).   Specialty: Internal Medicine Contact information: [address] [place] [address] [phone]          Allergies  Allergen Reactions  . Hydromorphone     Pt does not remember  . Lisinopril     fatigue  .  Mirtazapine     Pt doesn't remember.    . Nitrofuran Derivatives Diarrhea  . Septra [Bactrim]     Pt doesn't remember.   Octaviano Glow     Consultations:  None   Procedures/Studies: Ct Angio Chest Pe W Or Wo Contrast  Result Date: 01/17/2019 CLINICAL DATA:  Shortness of breath.  COVID-19 positive EXAM: CT ANGIOGRAPHY CHEST WITH CONTRAST TECHNIQUE: Multidetector CT imaging of the chest was performed using the standard protocol during bolus administration of intravenous contrast. Multiplanar CT image reconstructions and MIPs were obtained to evaluate the vascular anatomy. CONTRAST:  155mL OMNIPAQUE IOHEXOL 350 MG/ML SOLN COMPARISON:  Chest radiograph January 16, 2019 FINDINGS: Cardiovascular: There is no demonstrable pulmonary embolus. The ascending thoracic aortic diameter measures 4.1 x 4.1 cm. There is no appreciable thoracic aortic dissection. There are foci of calcification in visualized great vessels. There is aortic atherosclerosis as well as multiple foci of coronary artery calcification. There is a small pericardial effusion. There is no pericardial thickening. Mediastinum/Nodes: There are multiple subcentimeter nodular lesions in the thyroid consistent with multinodular goiter. There is a solid mass arising from the lower pole of the right lobe of the thyroid measuring 3.3 x 2.5 cm. There is focal calcification in the central aspect of this mass. There is no evident thoracic adenopathy. No esophageal lesions are evident. Lungs/Pleura: There is scarring in the apex regions. There is ground-glass type opacity in the medial segment right middle lobe. Similar ground-glass type opacity is noted in the lateral and posterior segments of the left lower lobe. A small focus of probable developing ground-glass opacity is noted in the posterior segment right lower lobe. There is bibasilar atelectasis. There are no appreciable pleural effusions. There is mild bilateral lower lobe  bronchiectasis. On axial slice 78 series 10, there is a 7 x 7 mm nodular opacity in the medial segment right middle lobe. There is an immediately adjacent 4 mm nodular opacity in this area seen on axial slice 77 series 10. Upper Abdomen: There is upper abdominal aortic atherosclerosis. Visualized upper abdominal structures otherwise appear unremarkable. Musculoskeletal: There is degenerative change in the thoracic spine. There are no blastic or lytic bone lesions. There is a total shoulder replacement on the left. There are no chest wall lesions evident. Review of the MIP images confirms the above findings. IMPRESSION: 1.  No demonstrable pulmonary embolus. 2. Ascending thoracic aorta measures 4.1 x 4.1 cm. No dissection evident. There is aortic atherosclerosis as well as foci of great vessel and coronary artery calcification. Recommend annual imaging followup by CTA or MRA. This recommendation follows 2010 ACCF/AHA/AATS/ACR/ASA/SCA/SCAI/SIR/STS/SVM Guidelines for the Diagnosis and Management of Patients with Thoracic Aortic Disease. Circulation. 2010; [id number]. Aortic aneurysm NOS (ICD10-I71.9). 3. Multifocal pneumonia with the greatest degree of airspace opacity in the right middle lobe and left lower lobe regions. There are scattered areas of atelectasis as well. 4. Nodular opacities in the right middle lobe, largest measuring 7 mm. Non-contrast chest CT at 6-12 months is  recommended. If the nodule is stable at time of repeat CT, then future CT at 18-24 months (from today's scan) is considered optional for low-risk patients, but is recommended for high-risk patients. This recommendation follows the consensus statement: Guidelines for Management of Incidental Pulmonary Nodules Detected on CT Images: From the Fleischner Society 2017; Radiology 2017; 284:228-243. 5. Dominant mass arising from right lobe of thyroid measuring 3.3 x 2.5 cm. Advise correlation with thyroid ultrasound given this finding. 6.  Mild  lower lobe bronchiectatic change. 7.  No evident adenopathy. Aortic Atherosclerosis (ICD10-I70.0). Electronically Signed   By: Lowella Grip III M.D.   On: 01/17/2019 08:49   Dg Chest Port 1 View  Result Date: 01/22/2019 CLINICAL DATA:  COVID-19. EXAM: PORTABLE CHEST 1 VIEW COMPARISON:  January 20, 2019. FINDINGS: Stable cardiomediastinal silhouette. No pneumothorax is noted. Right lung is clear. Minimal left basilar atelectasis is noted with possible small left pleural effusion. Status post left shoulder arthroplasty. IMPRESSION: Minimal left basilar atelectasis is noted with possible small left pleural effusion. No other abnormality seen in the chest. Electronically Signed   By: Marijo Conception M.D.   On: 01/22/2019 07:52   Dg Chest Port 1 View  Result Date: 01/20/2019 CLINICAL DATA:  COVID-19 positive EXAM: PORTABLE CHEST 1 VIEW COMPARISON:  01/19/2019 FINDINGS: Stable cardiomediastinal contours. Hyperexpanded lungs. Minimal vague opacity in the right perihilar region. Previously seen opacity in the lung bases appear less conspicuous on today's exam. Blunting of the left costophrenic angle may reflect interval development of a small left pleural effusion. No pneumothorax. Left reverse shoulder arthroplasty. IMPRESSION: 1. Previously described airspace opacities appear less conspicuous compared to prior exam. 2. Blunting of the left costophrenic angle may reflect a small left pleural effusion. Electronically Signed   By: Davina Poke M.D.   On: 01/20/2019 16:50   Dg Chest Port 1 View  Result Date: 01/19/2019 CLINICAL DATA:  Shortness of breath. EXAM: PORTABLE CHEST 1 VIEW COMPARISON:  01/18/2019 FINDINGS: 0416 hours. Lungs are hyperexpanded. Interstitial markings are diffusely coarsened with chronic features. Patchy airspace disease again noted right mid lung and left base. Potential new subtle airspace disease right upper lobe although superimposition of cardiac lead hinders assessment.  The cardiopericardial silhouette is within normal limits for size. Bones are diffusely demineralized. Status post left shoulder replacement. Telemetry leads overlie the chest. IMPRESSION: No substantial change. Patchy airspace disease right mid lung and left base. Emphysema with chronic interstitial coarsening. Electronically Signed   By: Misty Stanley M.D.   On: 01/19/2019 06:58   Dg Chest Port 1 View  Result Date: 01/18/2019 CLINICAL DATA:  85 year old female with shortness of breath, COVID pneumonia EXAM: PORTABLE CHEST 1 VIEW COMPARISON:  Prior chest x-ray 01/16/2019 FINDINGS: Developing patchy airspace opacities in the medial right base and right mid lung. These changes are superimposed on a background of chronic bronchitic changes and interstitial prominence. Stable cardiac and mediastinal contours. Atherosclerotic calcifications present in the thoracic aorta. Surgical changes of prior left shoulder arthroplasty. High-riding right humeral head suggests chronic rotator cuff tear. No pleural effusion or pneumothorax. IMPRESSION: Developing patchy airspace opacities in the right mid lung and medial right base consistent with the clinical history of COVID. Hyperinflation and chronic bronchitic changes consistent with the clinical history of COPD. Electronically Signed   By: Jacqulynn Cadet M.D.   On: 01/18/2019 13:20   Dg Chest Portable 1 View  Result Date: 01/16/2019 CLINICAL DATA:  COVID positive EXAM: PORTABLE CHEST 1 VIEW COMPARISON:  Chest radiograph  dated 12/20/2018 FINDINGS: The heart size is normal. Vascular calcifications are seen in the aortic arch. The lungs are hyperinflated. Mild biapical pleuroparenchymal scarring is unchanged. The patient is status post a reverse left shoulder arthroplasty. IMPRESSION: Hyperinflation likely reflecting chronic obstructive pulmonary disease. No focal consolidation. Electronically Signed   By: Zerita Boers M.D.   On: 01/16/2019 22:14     Discharge  Exam: Vitals:   01/27/19 0556 01/27/19 1243  BP: (!) 148/89 125/75  Pulse: 89 86  Resp: 16 16  Temp: 97.8 F (36.6 C) 97.6 F (36.4 C)  SpO2: 100% 98%   Vitals:   01/26/19 1111 01/26/19 2145 01/27/19 0556 01/27/19 1243  BP: (!) 157/82 (!) 167/84 (!) 148/89 125/75  Pulse: 94 97 89 86  Resp: 16 20 16 16   Temp: 97.6 F (36.4 C) 98.3 F (36.8 C) 97.8 F (36.6 C) 97.6 F (36.4 C)  TempSrc: Oral Axillary Oral Oral  SpO2: 99% 98% 100% 98%  Weight:      Height:        General: Pt is alert, awake, not in acute distress Cardiovascular: RRR, S1/S2 +, no rubs, no gallops Respiratory: CTA bilaterally, no wheezing, no rhonchi Abdominal: Soft, NT, ND, bowel sounds + Extremities: no edema, no cyanosis    The results of significant diagnostics from this hospitalization (including imaging, microbiology, ancillary and laboratory) are listed below for reference.     Microbiology: No results found for this or any previous visit (from the past 240 hour(s)).   Labs: BNP (last 3 results) No results for input(s): BNP in the last 8760 hours. Basic Metabolic Panel: Recent Labs  Lab 01/21/19 0501 01/22/19 0425  NA 134* 133*  K 4.5 4.4  CL 99 99  CO2 25 26  GLUCOSE 112* 102*  BUN 46* 50*  CREATININE 0.88 0.94  CALCIUM 8.7* 8.4*  MG 2.1 2.3  PHOS 3.3 3.7   Liver Function Tests: Recent Labs  Lab 01/21/19 0501 01/22/19 0425  AST 32 27  ALT 31 31  ALKPHOS 77 62  BILITOT 0.3 0.6  PROT 5.7* 5.6*  ALBUMIN 2.6* 2.6*   No results for input(s): LIPASE, AMYLASE in the last 168 hours. No results for input(s): AMMONIA in the last 168 hours. CBC: Recent Labs  Lab 01/21/19 0501 01/22/19 0425  WBC 7.8 9.0  NEUTROABS 5.6 6.2  HGB 11.3* 10.8*  HCT 35.9* 33.5*  MCV 95.2 94.6  PLT 258 290   Cardiac Enzymes: No results for input(s): CKTOTAL, CKMB, CKMBINDEX, TROPONINI in the last 168 hours. BNP: Invalid input(s): POCBNP CBG: Recent Labs  Lab 01/26/19 1109 01/26/19 1612  01/26/19 1958 01/27/19 0753 01/27/19 1217  GLUCAP 138* 158* 178* 98 96   D-Dimer No results for input(s): DDIMER in the last 72 hours. Hgb A1c No results for input(s): HGBA1C in the last 72 hours. Lipid Profile No results for input(s): CHOL, HDL, LDLCALC, TRIG, CHOLHDL, LDLDIRECT in the last 72 hours. Thyroid function studies No results for input(s): TSH, T4TOTAL, T3FREE, THYROIDAB in the last 72 hours.  Invalid input(s): FREET3 Anemia work up No results for input(s): VITAMINB12, FOLATE, FERRITIN, TIBC, IRON, RETICCTPCT in the last 72 hours. Urinalysis    Component Value Date/Time   COLORURINE YELLOW 12/20/2018 1254   APPEARANCEUR CLOUDY (A) 12/20/2018 1254   LABSPEC 1.015 12/20/2018 1254   PHURINE 8.0 12/20/2018 1254   GLUCOSEU 100 (A) 12/20/2018 1254   HGBUR LARGE (A) 12/20/2018 1254   BILIRUBINUR MODERATE (A) 12/20/2018 1254   KETONESUR 15 (  A) 12/20/2018 1254   PROTEINUR 100 (A) 12/20/2018 1254   UROBILINOGEN 0.2 10/30/2014 1728   NITRITE POSITIVE (A) 12/20/2018 1254   LEUKOCYTESUR MODERATE (A) 12/20/2018 1254   Sepsis Labs Invalid input(s): PROCALCITONIN,  WBC,  LACTICIDVEN Microbiology No results found for this or any previous visit (from the past 240 hour(s)).   Time coordinating discharge: 35 minutes  SIGNED:   Rodena Goldmann, DO [facility] 01/27/2019, 2:36 PM  If 7PM-7AM, please contact night-coverage [email or web address]

## 2019-01-27 NOTE — TOC Progression Note (Signed)
Transition of Care [facility]) - Progression Note    Patient Details  Name: LOCHLAN TORNETTA MRN: [id number] Date of Birth: [birth year]  Transition of [facility]) CM/SW Contact  Purcell Mouton, RN Phone Number: 01/27/2019, 4:17 PM  Clinical Narrative:    MD asked to do peer to peer [phone] Opt 5. Waiting for authorization number.    Expected Discharge Plan: Assisted Living    Expected Discharge Plan and Services Expected Discharge Plan: Assisted Living   Discharge Planning Services: CM Consult   Living arrangements for the past 2 months: [place] Expected Discharge Date: 01/27/19                                     Social Determinants of Health (SDOH) Interventions    Readmission Risk Interventions No flowsheet data found.

## 2019-01-27 NOTE — TOC Progression Note (Signed)
Transition of Care [facility]) - Progression Note    Patient Details  Name: [name] MRN: [id number] Date of Birth: 1930-07-01  Transition of Care [facility] CM/SW Contact  Purcell Mouton, RN Phone Number: 01/27/2019, 4:23 PM  Clinical Narrative:    Pt was approved for SNF 01/28/2019 in AM to [facility]. [id number] ref, next review 01/30/2019.     Expected Discharge Plan: Assisted Living    Expected Discharge Plan and Services Expected Discharge Plan: Assisted Living   Discharge Planning Services: CM Consult   Living arrangements for the past 2 months: [place] Expected Discharge Date: 01/27/19                                     Social Determinants of Health (SDOH) Interventions    Readmission Risk Interventions No flowsheet data found.

## 2019-01-28 LAB — GLUCOSE, CAPILLARY
Glucose-Capillary: 70 mg/dL (ref 70–99)
Glucose-Capillary: 105 mg/dL — ABNORMAL HIGH (ref 70–99)

## 2019-01-28 MED ORDER — DIPHENOXYLATE-ATROPINE 2.5-0.025 MG PO TABS: 1.0000 | ORAL_TABLET | Freq: Three times a day (TID) | ORAL | 0 refills | Status: DC

## 2019-01-28 NOTE — Progress Notes (Signed)
Patient seen and evaluated this morning.  No new complaints or concerns noted this morning.  No acute events noted overnight.  Please refer to discharge summary dictated 01/27/2019.  She is stable for transfer to SNF/rehab today.  Total care time: 15 minutes.

## 2019-01-28 NOTE — Progress Notes (Signed)
Report called to [place] at [facility].  Awaiting PTAR for transport.

## 2019-01-28 NOTE — TOC Progression Note (Signed)
Transition of Care [facility]) - Progression Note    Patient Details  Name: [name] MRN: [id number] Date of Birth: 27-Dec-1930  Transition of Care [facility]) CM/SW Contact  Purcell Mouton, RN Phone Number: 01/28/2019, 11:58 AM  Clinical Narrative:    Corey Harold was called for transportation at 1:00 PM. [place] asked that pt be there at [facility]. RN aware.   Expected Discharge Plan: Assisted Living    Expected Discharge Plan and Services Expected Discharge Plan: Assisted Living   Discharge Planning Services: CM Consult   Living arrangements for the past 2 months: [place] Expected Discharge Date: 01/28/19                                     Social Determinants of Health (SDOH) Interventions    Readmission Risk Interventions No flowsheet data found.

## 2020-04-20 DIAGNOSIS — I129 Hypertensive chronic kidney disease with stage 1 through stage 4 chronic kidney disease, or unspecified chronic kidney disease: Secondary | ICD-10-CM | POA: Diagnosis not present

## 2020-04-20 DIAGNOSIS — N1832 Chronic kidney disease, stage 3b: Secondary | ICD-10-CM | POA: Diagnosis not present

## 2020-04-20 DIAGNOSIS — M069 Rheumatoid arthritis, unspecified: Secondary | ICD-10-CM | POA: Diagnosis not present

## 2020-04-20 DIAGNOSIS — G629 Polyneuropathy, unspecified: Secondary | ICD-10-CM | POA: Diagnosis not present

## 2020-04-20 DIAGNOSIS — R269 Unspecified abnormalities of gait and mobility: Secondary | ICD-10-CM | POA: Diagnosis not present

## 2020-04-20 DIAGNOSIS — I700 Atherosclerosis of aorta: Secondary | ICD-10-CM | POA: Diagnosis not present

## 2020-04-26 DIAGNOSIS — N1832 Chronic kidney disease, stage 3b: Secondary | ICD-10-CM | POA: Diagnosis not present

## 2020-04-26 DIAGNOSIS — M069 Rheumatoid arthritis, unspecified: Secondary | ICD-10-CM | POA: Diagnosis not present

## 2020-04-26 DIAGNOSIS — M810 Age-related osteoporosis without current pathological fracture: Secondary | ICD-10-CM | POA: Diagnosis not present

## 2020-04-26 DIAGNOSIS — I129 Hypertensive chronic kidney disease with stage 1 through stage 4 chronic kidney disease, or unspecified chronic kidney disease: Secondary | ICD-10-CM | POA: Diagnosis not present

## 2020-05-24 DIAGNOSIS — N1832 Chronic kidney disease, stage 3b: Secondary | ICD-10-CM | POA: Diagnosis not present

## 2020-05-24 DIAGNOSIS — M069 Rheumatoid arthritis, unspecified: Secondary | ICD-10-CM | POA: Diagnosis not present

## 2020-05-24 DIAGNOSIS — I129 Hypertensive chronic kidney disease with stage 1 through stage 4 chronic kidney disease, or unspecified chronic kidney disease: Secondary | ICD-10-CM | POA: Diagnosis not present

## 2020-05-24 DIAGNOSIS — R300 Dysuria: Secondary | ICD-10-CM | POA: Diagnosis not present

## 2020-06-01 ENCOUNTER — Other Ambulatory Visit: Payer: Self-pay | Admitting: Geriatric Medicine

## 2020-06-01 DIAGNOSIS — R3911 Hesitancy of micturition: Secondary | ICD-10-CM

## 2020-06-21 ENCOUNTER — Ambulatory Visit
Admission: RE | Admit: 2020-06-21 | Discharge: 2020-06-21 | Disposition: A | Discharge status: Home / Self Care | Payer: Medicare Other | Source: Ambulatory Visit | Attending: Geriatric Medicine | Admitting: Geriatric Medicine

## 2020-06-21 ENCOUNTER — Other Ambulatory Visit: Payer: Self-pay

## 2020-06-21 DIAGNOSIS — N3289 Other specified disorders of bladder: Secondary | ICD-10-CM | POA: Diagnosis not present

## 2020-06-21 DIAGNOSIS — N281 Cyst of kidney, acquired: Secondary | ICD-10-CM | POA: Diagnosis not present

## 2020-06-21 DIAGNOSIS — R3911 Hesitancy of micturition: Secondary | ICD-10-CM | POA: Diagnosis not present

## 2020-06-21 DIAGNOSIS — N200 Calculus of kidney: Secondary | ICD-10-CM | POA: Diagnosis not present

## 2020-08-31 DIAGNOSIS — D225 Melanocytic nevi of trunk: Secondary | ICD-10-CM | POA: Diagnosis not present

## 2020-08-31 DIAGNOSIS — L218 Other seborrheic dermatitis: Secondary | ICD-10-CM | POA: Diagnosis not present

## 2020-08-31 DIAGNOSIS — D2262 Melanocytic nevi of left upper limb, including shoulder: Secondary | ICD-10-CM | POA: Diagnosis not present

## 2020-08-31 DIAGNOSIS — C44529 Squamous cell carcinoma of skin of other part of trunk: Secondary | ICD-10-CM | POA: Diagnosis not present

## 2020-08-31 DIAGNOSIS — Z85820 Personal history of malignant melanoma of skin: Secondary | ICD-10-CM | POA: Diagnosis not present

## 2020-08-31 DIAGNOSIS — Z85828 Personal history of other malignant neoplasm of skin: Secondary | ICD-10-CM | POA: Diagnosis not present

## 2020-08-31 DIAGNOSIS — L820 Inflamed seborrheic keratosis: Secondary | ICD-10-CM | POA: Diagnosis not present

## 2020-08-31 DIAGNOSIS — B351 Tinea unguium: Secondary | ICD-10-CM | POA: Diagnosis not present

## 2020-08-31 DIAGNOSIS — L821 Other seborrheic keratosis: Secondary | ICD-10-CM | POA: Diagnosis not present

## 2020-10-14 DIAGNOSIS — Z8616 Personal history of COVID-19: Secondary | ICD-10-CM | POA: Diagnosis not present

## 2020-11-15 DIAGNOSIS — E46 Unspecified protein-calorie malnutrition: Secondary | ICD-10-CM | POA: Diagnosis not present

## 2020-11-15 DIAGNOSIS — D171 Benign lipomatous neoplasm of skin and subcutaneous tissue of trunk: Secondary | ICD-10-CM | POA: Diagnosis not present

## 2020-11-17 DIAGNOSIS — R3914 Feeling of incomplete bladder emptying: Secondary | ICD-10-CM | POA: Diagnosis not present

## 2020-11-17 DIAGNOSIS — N3020 Other chronic cystitis without hematuria: Secondary | ICD-10-CM | POA: Diagnosis not present

## 2020-12-20 ENCOUNTER — Inpatient Hospital Stay (HOSPITAL_COMMUNITY)
Admission: EM | Admit: 2020-12-20 | Discharge: 2020-12-31 | DRG: 689 | Disposition: A | Discharge status: Skilled Nursing Facility | Payer: Medicare Other | Attending: Internal Medicine | Admitting: Internal Medicine

## 2020-12-20 ENCOUNTER — Other Ambulatory Visit: Payer: Self-pay

## 2020-12-20 ENCOUNTER — Encounter (HOSPITAL_COMMUNITY): Payer: Self-pay

## 2020-12-20 DIAGNOSIS — E43 Unspecified severe protein-calorie malnutrition: Secondary | ICD-10-CM | POA: Diagnosis not present

## 2020-12-20 DIAGNOSIS — Z7901 Long term (current) use of anticoagulants: Secondary | ICD-10-CM

## 2020-12-20 DIAGNOSIS — J189 Pneumonia, unspecified organism: Secondary | ICD-10-CM | POA: Diagnosis present

## 2020-12-20 DIAGNOSIS — I499 Cardiac arrhythmia, unspecified: Secondary | ICD-10-CM | POA: Diagnosis not present

## 2020-12-20 DIAGNOSIS — R54 Age-related physical debility: Secondary | ICD-10-CM | POA: Diagnosis present

## 2020-12-20 DIAGNOSIS — W19XXXA Unspecified fall, initial encounter: Secondary | ICD-10-CM | POA: Diagnosis not present

## 2020-12-20 DIAGNOSIS — R0902 Hypoxemia: Secondary | ICD-10-CM | POA: Diagnosis not present

## 2020-12-20 DIAGNOSIS — K529 Noninfective gastroenteritis and colitis, unspecified: Secondary | ICD-10-CM | POA: Diagnosis present

## 2020-12-20 DIAGNOSIS — R2689 Other abnormalities of gait and mobility: Secondary | ICD-10-CM | POA: Diagnosis not present

## 2020-12-20 DIAGNOSIS — Z681 Body mass index (BMI) 19 or less, adult: Secondary | ICD-10-CM

## 2020-12-20 DIAGNOSIS — Z1624 Resistance to multiple antibiotics: Secondary | ICD-10-CM | POA: Diagnosis not present

## 2020-12-20 DIAGNOSIS — Z96612 Presence of left artificial shoulder joint: Secondary | ICD-10-CM | POA: Diagnosis present

## 2020-12-20 DIAGNOSIS — Z87891 Personal history of nicotine dependence: Secondary | ICD-10-CM

## 2020-12-20 DIAGNOSIS — Z888 Allergy status to other drugs, medicaments and biological substances status: Secondary | ICD-10-CM

## 2020-12-20 DIAGNOSIS — R5383 Other fatigue: Secondary | ICD-10-CM | POA: Diagnosis not present

## 2020-12-20 DIAGNOSIS — Z8249 Family history of ischemic heart disease and other diseases of the circulatory system: Secondary | ICD-10-CM

## 2020-12-20 DIAGNOSIS — I5021 Acute systolic (congestive) heart failure: Secondary | ICD-10-CM

## 2020-12-20 DIAGNOSIS — Z7401 Bed confinement status: Secondary | ICD-10-CM | POA: Diagnosis not present

## 2020-12-20 DIAGNOSIS — Z85820 Personal history of malignant melanoma of skin: Secondary | ICD-10-CM | POA: Diagnosis not present

## 2020-12-20 DIAGNOSIS — E869 Volume depletion, unspecified: Secondary | ICD-10-CM | POA: Diagnosis present

## 2020-12-20 DIAGNOSIS — G709 Myoneural disorder, unspecified: Secondary | ICD-10-CM | POA: Diagnosis not present

## 2020-12-20 DIAGNOSIS — D649 Anemia, unspecified: Secondary | ICD-10-CM | POA: Diagnosis present

## 2020-12-20 DIAGNOSIS — R531 Weakness: Secondary | ICD-10-CM

## 2020-12-20 DIAGNOSIS — N390 Urinary tract infection, site not specified: Secondary | ICD-10-CM | POA: Diagnosis not present

## 2020-12-20 DIAGNOSIS — Z881 Allergy status to other antibiotic agents status: Secondary | ICD-10-CM

## 2020-12-20 DIAGNOSIS — Z79899 Other long term (current) drug therapy: Secondary | ICD-10-CM

## 2020-12-20 DIAGNOSIS — I4892 Unspecified atrial flutter: Secondary | ICD-10-CM | POA: Diagnosis present

## 2020-12-20 DIAGNOSIS — Z743 Need for continuous supervision: Secondary | ICD-10-CM | POA: Diagnosis not present

## 2020-12-20 DIAGNOSIS — Z8616 Personal history of COVID-19: Secondary | ICD-10-CM

## 2020-12-20 DIAGNOSIS — R197 Diarrhea, unspecified: Secondary | ICD-10-CM

## 2020-12-20 DIAGNOSIS — Z9049 Acquired absence of other specified parts of digestive tract: Secondary | ICD-10-CM

## 2020-12-20 DIAGNOSIS — E876 Hypokalemia: Secondary | ICD-10-CM | POA: Diagnosis present

## 2020-12-20 DIAGNOSIS — K219 Gastro-esophageal reflux disease without esophagitis: Secondary | ICD-10-CM | POA: Diagnosis not present

## 2020-12-20 DIAGNOSIS — I480 Paroxysmal atrial fibrillation: Secondary | ICD-10-CM | POA: Diagnosis not present

## 2020-12-20 DIAGNOSIS — B9620 Unspecified Escherichia coli [E. coli] as the cause of diseases classified elsewhere: Secondary | ICD-10-CM | POA: Diagnosis present

## 2020-12-20 DIAGNOSIS — J90 Pleural effusion, not elsewhere classified: Secondary | ICD-10-CM | POA: Diagnosis not present

## 2020-12-20 DIAGNOSIS — R262 Difficulty in walking, not elsewhere classified: Secondary | ICD-10-CM | POA: Diagnosis not present

## 2020-12-20 DIAGNOSIS — I10 Essential (primary) hypertension: Secondary | ICD-10-CM | POA: Diagnosis not present

## 2020-12-20 DIAGNOSIS — Z885 Allergy status to narcotic agent status: Secondary | ICD-10-CM

## 2020-12-20 DIAGNOSIS — E860 Dehydration: Secondary | ICD-10-CM | POA: Diagnosis not present

## 2020-12-20 DIAGNOSIS — R296 Repeated falls: Secondary | ICD-10-CM | POA: Diagnosis present

## 2020-12-20 DIAGNOSIS — M6281 Muscle weakness (generalized): Secondary | ICD-10-CM | POA: Diagnosis not present

## 2020-12-20 DIAGNOSIS — R5381 Other malaise: Secondary | ICD-10-CM | POA: Diagnosis not present

## 2020-12-20 DIAGNOSIS — R000 Tachycardia, unspecified: Secondary | ICD-10-CM | POA: Diagnosis not present

## 2020-12-20 DIAGNOSIS — M810 Age-related osteoporosis without current pathological fracture: Secondary | ICD-10-CM | POA: Diagnosis not present

## 2020-12-20 DIAGNOSIS — J440 Chronic obstructive pulmonary disease with acute lower respiratory infection: Secondary | ICD-10-CM | POA: Diagnosis present

## 2020-12-20 DIAGNOSIS — I110 Hypertensive heart disease with heart failure: Secondary | ICD-10-CM | POA: Diagnosis not present

## 2020-12-20 DIAGNOSIS — E049 Nontoxic goiter, unspecified: Secondary | ICD-10-CM | POA: Diagnosis not present

## 2020-12-20 DIAGNOSIS — R2681 Unsteadiness on feet: Secondary | ICD-10-CM | POA: Diagnosis not present

## 2020-12-20 DIAGNOSIS — Z96643 Presence of artificial hip joint, bilateral: Secondary | ICD-10-CM | POA: Diagnosis present

## 2020-12-20 DIAGNOSIS — Z882 Allergy status to sulfonamides status: Secondary | ICD-10-CM

## 2020-12-20 DIAGNOSIS — I4891 Unspecified atrial fibrillation: Secondary | ICD-10-CM | POA: Diagnosis not present

## 2020-12-20 DIAGNOSIS — Z823 Family history of stroke: Secondary | ICD-10-CM

## 2020-12-20 DIAGNOSIS — R404 Transient alteration of awareness: Secondary | ICD-10-CM | POA: Diagnosis not present

## 2020-12-20 DIAGNOSIS — Z96653 Presence of artificial knee joint, bilateral: Secondary | ICD-10-CM | POA: Diagnosis not present

## 2020-12-20 DIAGNOSIS — R6889 Other general symptoms and signs: Secondary | ICD-10-CM | POA: Diagnosis not present

## 2020-12-20 DIAGNOSIS — I509 Heart failure, unspecified: Secondary | ICD-10-CM | POA: Diagnosis not present

## 2020-12-20 DIAGNOSIS — Z883 Allergy status to other anti-infective agents status: Secondary | ICD-10-CM

## 2020-12-20 DIAGNOSIS — J449 Chronic obstructive pulmonary disease, unspecified: Secondary | ICD-10-CM | POA: Diagnosis not present

## 2020-12-20 DIAGNOSIS — G629 Polyneuropathy, unspecified: Secondary | ICD-10-CM | POA: Diagnosis not present

## 2020-12-20 DIAGNOSIS — N3000 Acute cystitis without hematuria: Secondary | ICD-10-CM | POA: Diagnosis not present

## 2020-12-20 LAB — COMPREHENSIVE METABOLIC PANEL
ALT: 19 U/L (ref 0–44)
AST: 33 U/L (ref 15–41)
Albumin: 3.7 g/dL (ref 3.5–5.0)
Alkaline Phosphatase: 100 U/L (ref 38–126)
Anion gap: 12 (ref 5–15)
BUN: 27 mg/dL — ABNORMAL HIGH (ref 8–23)
CO2: 25 mmol/L (ref 22–32)
Calcium: 9.3 mg/dL (ref 8.9–10.3)
Chloride: 101 mmol/L (ref 98–111)
Creatinine, Ser: 1.22 mg/dL — ABNORMAL HIGH (ref 0.44–1.00)
GFR, Estimated: 43 mL/min — ABNORMAL LOW (ref >60)
Glucose, Bld: 143 mg/dL — ABNORMAL HIGH (ref 70–99)
Potassium: 3.4 mmol/L — ABNORMAL LOW (ref 3.5–5.1)
Sodium: 138 mmol/L (ref 135–145)
Total Bilirubin: 0.7 mg/dL (ref 0.3–1.2)
Total Protein: 7.6 g/dL (ref 6.5–8.1)

## 2020-12-20 LAB — CBC WITH DIFFERENTIAL/PLATELET
Abs Immature Granulocytes: 0.02 10*3/uL (ref 0.00–0.07)
Basophils Absolute: 0 10*3/uL (ref 0.0–0.1)
Basophils Relative: 0 %
Eosinophils Absolute: 0 10*3/uL (ref 0.0–0.5)
Eosinophils Relative: 0 %
HCT: 43.5 % (ref 36.0–46.0)
Hemoglobin: 13.8 g/dL (ref 12.0–15.0)
Immature Granulocytes: 0 %
Lymphocytes Relative: 7 %
Lymphs Abs: 0.5 10*3/uL — ABNORMAL LOW (ref 0.7–4.0)
MCH: 29.6 pg (ref 26.0–34.0)
MCHC: 31.7 g/dL (ref 30.0–36.0)
MCV: 93.1 fL (ref 80.0–100.0)
Monocytes Absolute: 0.6 10*3/uL (ref 0.1–1.0)
Monocytes Relative: 8 %
Neutro Abs: 6.9 10*3/uL (ref 1.7–7.7)
Neutrophils Relative %: 85 %
Platelets: 212 10*3/uL (ref 150–400)
RBC: 4.67 MIL/uL (ref 3.87–5.11)
RDW: 13.6 % (ref 11.5–15.5)
WBC: 8.1 10*3/uL (ref 4.0–10.5)
nRBC: 0 % (ref 0.0–0.2)

## 2020-12-20 LAB — LIPASE, BLOOD: Lipase: 38 U/L (ref 11–51)

## 2020-12-20 NOTE — ED Provider Notes (Signed)
Emergency Medicine Provider Triage Evaluation Note  ZENDA HERSKOWITZ , a 87 y.o. female  was evaluated in triage.  Pt complains of nonbloody diarrhea x3 days.  Denies associated nausea vomiting.  No abdominal pain.  Patient endorses generalized weakness.  No syncope.  No fever or chills.  Review of Systems  Positive: diarrhea Negative: Vomiting   Physical Exam  There were no vitals taken for this visit. Gen:   Awake, no distress   Resp:  Normal effort  MSK:   Moves extremities without difficulty  Other:  Abdomen soft, nondistended, nontender to palpation in all quadrants without guarding or peritoneal signs. No rebound.    Medical Decision Making  Medically screening exam initiated at 3:38 PM.  Appropriate orders placed.  [name] was informed that the remainder of the evaluation will be completed by another provider, this initial triage assessment does not replace that evaluation, and the importance of remaining in the ED until their evaluation is complete.  Abdominal labs   Karie Kirks 87/27/61 8485    Delora Fuel, MD 92/76/39 [phone]

## 2020-12-20 NOTE — ED Triage Notes (Addendum)
Per EMS, patient from [facility] independent living, c/o diarrhea x3 days. Weakness worsening since that time.   20g R FA 171ml NS

## 2020-12-21 DIAGNOSIS — N390 Urinary tract infection, site not specified: Secondary | ICD-10-CM | POA: Diagnosis not present

## 2020-12-21 DIAGNOSIS — E876 Hypokalemia: Secondary | ICD-10-CM | POA: Diagnosis present

## 2020-12-21 DIAGNOSIS — E869 Volume depletion, unspecified: Secondary | ICD-10-CM | POA: Diagnosis present

## 2020-12-21 LAB — URINALYSIS, ROUTINE W REFLEX MICROSCOPIC
Bilirubin Urine: NEGATIVE
Glucose, UA: NEGATIVE mg/dL
Ketones, ur: NEGATIVE mg/dL
Nitrite: NEGATIVE
Protein, ur: 100 mg/dL — AB
Specific Gravity, Urine: 1.014 (ref 1.005–1.030)
WBC, UA: >50 WBC/hpf — ABNORMAL HIGH (ref 0–5)
pH: 5 (ref 5.0–8.0)

## 2020-12-21 LAB — CBC
HCT: 38.2 % (ref 36.0–46.0)
Hemoglobin: 12.5 g/dL (ref 12.0–15.0)
MCH: 29.8 pg (ref 26.0–34.0)
MCHC: 32.7 g/dL (ref 30.0–36.0)
MCV: 91 fL (ref 80.0–100.0)
Platelets: 214 10*3/uL (ref 150–400)
RBC: 4.2 MIL/uL (ref 3.87–5.11)
RDW: 13.5 % (ref 11.5–15.5)
WBC: 6.8 10*3/uL (ref 4.0–10.5)
nRBC: 0 % (ref 0.0–0.2)

## 2020-12-21 LAB — RESP PANEL BY RT-PCR (FLU A&B, COVID) ARPGX2
Influenza A by PCR: NEGATIVE
Influenza B by PCR: NEGATIVE
SARS Coronavirus 2 by RT PCR: NEGATIVE

## 2020-12-21 LAB — COMPREHENSIVE METABOLIC PANEL
ALT: 17 U/L (ref 0–44)
AST: 22 U/L (ref 15–41)
Albumin: 3.1 g/dL — ABNORMAL LOW (ref 3.5–5.0)
Alkaline Phosphatase: 82 U/L (ref 38–126)
Anion gap: 11 (ref 5–15)
BUN: 27 mg/dL — ABNORMAL HIGH (ref 8–23)
CO2: 26 mmol/L (ref 22–32)
Calcium: 8.8 mg/dL — ABNORMAL LOW (ref 8.9–10.3)
Chloride: 100 mmol/L (ref 98–111)
Creatinine, Ser: 0.96 mg/dL (ref 0.44–1.00)
GFR, Estimated: 57 mL/min — ABNORMAL LOW (ref >60)
Glucose, Bld: 103 mg/dL — ABNORMAL HIGH (ref 70–99)
Potassium: 3.4 mmol/L — ABNORMAL LOW (ref 3.5–5.1)
Sodium: 137 mmol/L (ref 135–145)
Total Bilirubin: 0.5 mg/dL (ref 0.3–1.2)
Total Protein: 6.7 g/dL (ref 6.5–8.1)

## 2020-12-21 LAB — PHOSPHORUS: Phosphorus: 2.9 mg/dL (ref 2.5–4.6)

## 2020-12-21 LAB — C DIFFICILE QUICK SCREEN W PCR REFLEX
C Diff antigen: NEGATIVE
C Diff interpretation: NOT DETECTED
C Diff toxin: NEGATIVE

## 2020-12-21 LAB — MAGNESIUM: Magnesium: 2 mg/dL (ref 1.7–2.4)

## 2020-12-21 MED ORDER — CARVEDILOL 6.25 MG PO TABS
6.2500 mg | ORAL_TABLET | Freq: Two times a day (BID) | ORAL | Status: DC
  Administered 2020-12-21 – 2020-12-24 (×6): 6.25 mg via ORAL
  Filled 2020-12-21: qty 1
  Filled 2020-12-21: qty 2
  Filled 2020-12-21 (×2): qty 1
  Filled 2020-12-21: qty 2
  Filled 2020-12-21: qty 1

## 2020-12-21 MED ORDER — ONDANSETRON HCL 4 MG PO TABS: 4.0000 mg | ORAL_TABLET | Freq: Four times a day (QID) | ORAL | Status: DC | PRN

## 2020-12-21 MED ORDER — AMLODIPINE BESYLATE 5 MG PO TABS
2.5000 mg | ORAL_TABLET | Freq: Every day | ORAL | Status: DC
  Administered 2020-12-21 – 2020-12-22 (×2): 2.5 mg via ORAL
  Filled 2020-12-21 (×2): qty 1

## 2020-12-21 MED ORDER — METRONIDAZOLE 500 MG/100ML IV SOLN: 500.0000 mg | Freq: Two times a day (BID) | INTRAVENOUS | Status: DC

## 2020-12-21 MED ORDER — POTASSIUM CHLORIDE IN NACL 20-0.9 MEQ/L-% IV SOLN
INTRAVENOUS | Status: AC
  Filled 2020-12-21 (×2): qty 1000

## 2020-12-21 MED ORDER — MAGNESIUM SULFATE 2 GM/50ML IV SOLN
2.0000 g | Freq: Once | INTRAVENOUS | Status: AC
  Administered 2020-12-21: 2 g via INTRAVENOUS
  Filled 2020-12-21: qty 50

## 2020-12-21 MED ORDER — ONDANSETRON HCL 4 MG/2ML IJ SOLN: 4.0000 mg | Freq: Four times a day (QID) | INTRAMUSCULAR | Status: DC | PRN

## 2020-12-21 MED ORDER — LOPERAMIDE HCL 2 MG PO CAPS: 2.0000 mg | ORAL_CAPSULE | Freq: Four times a day (QID) | ORAL | 0 refills | Status: AC | PRN

## 2020-12-21 MED ORDER — ACETAMINOPHEN 650 MG RE SUPP: 650.0000 mg | Freq: Four times a day (QID) | RECTAL | Status: DC | PRN

## 2020-12-21 MED ORDER — METOPROLOL TARTRATE 5 MG/5ML IV SOLN
2.5000 mg | Freq: Once | INTRAVENOUS | Status: AC
  Administered 2020-12-21: 2.5 mg via INTRAVENOUS
  Filled 2020-12-21: qty 5

## 2020-12-21 MED ORDER — LACTATED RINGERS IV SOLN: INTRAVENOUS | Status: DC

## 2020-12-21 MED ORDER — LOPERAMIDE HCL 2 MG PO CAPS
4.0000 mg | ORAL_CAPSULE | Freq: Once | ORAL | Status: AC
  Administered 2020-12-21: 4 mg via ORAL
  Filled 2020-12-21: qty 2

## 2020-12-21 MED ORDER — SODIUM CHLORIDE 0.9 % IV SOLN
1.0000 g | INTRAVENOUS | Status: DC
  Administered 2020-12-22 – 2020-12-23 (×2): 1 g via INTRAVENOUS
  Filled 2020-12-21 (×2): qty 10

## 2020-12-21 MED ORDER — ENOXAPARIN SODIUM 30 MG/0.3ML IJ SOSY
30.0000 mg | PREFILLED_SYRINGE | INTRAMUSCULAR | Status: DC
  Administered 2020-12-21 – 2020-12-23 (×3): 30 mg via SUBCUTANEOUS
  Filled 2020-12-21 (×3): qty 0.3

## 2020-12-21 MED ORDER — LACTATED RINGERS IV BOLUS
1000.0000 mL | Freq: Once | INTRAVENOUS | Status: AC
  Administered 2020-12-21: 1000 mL via INTRAVENOUS

## 2020-12-21 MED ORDER — ACETAMINOPHEN 325 MG PO TABS
650.0000 mg | ORAL_TABLET | Freq: Four times a day (QID) | ORAL | Status: DC | PRN
  Administered 2020-12-21: 650 mg via ORAL
  Filled 2020-12-21: qty 2

## 2020-12-21 MED ORDER — POTASSIUM CHLORIDE CRYS ER 20 MEQ PO TBCR
40.0000 meq | EXTENDED_RELEASE_TABLET | Freq: Once | ORAL | Status: DC
  Filled 2020-12-21: qty 2

## 2020-12-21 MED ORDER — SODIUM CHLORIDE 0.9 % IV SOLN
1.0000 g | Freq: Once | INTRAVENOUS | Status: AC
  Administered 2020-12-21: 1 g via INTRAVENOUS
  Filled 2020-12-21: qty 10

## 2020-12-21 MED ORDER — POTASSIUM CHLORIDE 10 MEQ/100ML IV SOLN
10.0000 meq | Freq: Once | INTRAVENOUS | Status: AC
  Administered 2020-12-21: 10 meq via INTRAVENOUS
  Filled 2020-12-21: qty 100

## 2020-12-21 MED ORDER — OPIUM 10 MG/ML (1%) PO TINC
0.6000 mL | Freq: Four times a day (QID) | ORAL | Status: DC | PRN
  Administered 2020-12-21 – 2020-12-23 (×6): 6 mg via ORAL
  Filled 2020-12-21 (×8): qty 0.6

## 2020-12-21 NOTE — ED Notes (Addendum)
Hospitalist aware and to see pt.

## 2020-12-21 NOTE — H&P (Signed)
History and Physical    [name] [id number] DOB: March 17, 1930 DOA: 12/20/2020  PCP: Lajean Manes, MD  Patient coming from: Assisted living.  I have personally briefly reviewed patient's old medical records in [place]  Chief Complaint: Generalized weakness.  HPI: [name] is a 87 y.o. female with medical history significant of anemia, osteoarthritis, cancer, chronic diarrhea, COPD, depression, GERD, goiter, headache, hypertension, unspecified peripheral neuropathy, osteoporosis, history of UTI who is coming to the emergency department due to generalized weakness and diarrhea for the past 3 days.  No fever, but positive chills and night sweats.  No rhinorrhea, sore throat, wheezing, productive cough or hemoptysis.  Denied chest pain, palpitations, diaphoresis, PND, orthopnea or pitting edema lower extremities.  No abdominal pain, nausea, emesis, constipation, melena or hematochezia.  Positive frequency no dysuria, flank pain or hematuria.  No polyuria, polydipsia, polyphagia or blurred vision.  ED Course: Initial vital signs were temperature 98.6 F, pulse 86, respiration 18, BP 165/96 mmHg O2 sat 99% on room air.  The patient received 1000 mL of LR bolus, loperamide 2 mg p.o., ceftriaxone 1 g IVPB and 10 mEq of potassium IVPB.  While in the emergency department, the patient had tachycardia in the 130s and 498Y with her systolic blood pressure in the 160s.  These responded to metoprolol and electrolyte replacement.  Lab work: Her urinalysis shows small hemoglobinuria, proteinuria 100 mg/dL, large leukocyte esterase, more than 50 WBC and many bacteria microscopic examination with WBC clumps.  CBC showed a white count of 8.1 with 85% neutrophils, Moding 13.8 g/dL platelets 212.  Lipase was normal.  CMP showed a potassium of 3.4 mmol/L.  The rest of the electrolytes were normal.  Glucose 143, BUN 27 and creatinine 1.22 mg/dL.  Hepatic function was unremarkable.  Review of Systems: As  per HPI otherwise all other systems reviewed and are negative.  Past Medical History:  Diagnosis Date   Anemia    Arthritis    Cancer ([place])    melanoma   Chronic diarrhea    Complication of anesthesia    hallucinations in the past, last surgery was OK anesth.    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   GERD (gastroesophageal reflux disease)    Goiter    Headache(784.0)    sinus   History of blood transfusion 2006   due to colon bleeding    Hypertension    Neuromuscular disorder (HCC)    peripheral neuropathy - both feet    Osteoporosis    Shortness of breath dyspnea    UTI (lower urinary tract infection)    start Tx today /w probiotic- 08/18/2014     Past Surgical History:  Procedure Laterality Date   ABDOMINAL HYSTERECTOMY     APPENDECTOMY     BALLOON DILATION  04/19/2011   Procedure: BALLOON DILATION;  Surgeon: Landry Dyke, MD;  Location: [facility] ENDOSCOPY;  Service: Endoscopy;  Laterality: N/A;   BLADDER SURGERY     COLON SURGERY  2006   Dr. Zella Richer- due to diverticulitis   ESOPHAGOGASTRODUODENOSCOPY  04/19/2011   Procedure: ESOPHAGOGASTRODUODENOSCOPY (EGD);  Surgeon: Landry Dyke, MD;  Location: [facility];  Service: Endoscopy;  Laterality: N/A;  mac    hip replacement-bilateral     JOINT REPLACEMENT     REVERSE SHOULDER ARTHROPLASTY Left 08/27/2014   Procedure: REVERSE LEFT SHOULDER ARTHROPLASTY;  Surgeon: Justice Britain, MD;  Location: [place];  Service: Orthopedics;  Laterality: Left;   TOTAL KNEE ARTHROPLASTY Bilateral    Social History  reports that she quit smoking about 52 years ago. She has never used smokeless tobacco. She reports current alcohol use. She reports that she does not use drugs.  Allergies  Allergen Reactions   Hydromorphone     Pt does not remember   Lisinopril     fatigue   Mirtazapine     Pt doesn't remember.     Nitrofuran Derivatives Diarrhea   Septra  [Bactrim]     Pt doesn't remember.    Sulfamethoxazole-Trimethoprim    Family History  Problem Relation Age of Onset   Heart attack Mother    Stroke Father    Prior to Admission medications   Medication Sig Start Date End Date Taking? Authorizing Provider  loperamide (IMODIUM) 2 MG capsule Take 1 capsule (2 mg total) by mouth 4 (four) times daily as needed for diarrhea or loose stools. 47/09/62  Yes Delora Fuel, MD  albuterol (VENTOLIN HFA) 108 (90 Base) MCG/ACT inhaler Inhale 2-4 puffs into the lungs every 4 (four) hours as needed for wheezing or shortness of breath. 01/27/19   Manuella Ghazi, Pratik D, DO  amLODipine (NORVASC) 10 MG tablet Take 1 tablet (10 mg total) by mouth daily. Patient not taking: Reported on 01/17/2019 12/25/18   Kayleen Memos, DO  diphenoxylate-atropine (LOMOTIL) 2.5-0.025 MG tablet Take 1 tablet by mouth 3 (three) times daily before meals. 01/28/19   Manuella Ghazi, Pratik D, DO  Ensure Plus (ENSURE PLUS) LIQD Take 237 mLs by mouth 3 (three) times daily with meals.    [provider]  glycopyrrolate (ROBINUL) 1 MG tablet Take 1 tablet (1 mg total) by mouth 3 (three) times daily before meals. 12/24/18   Kayleen Memos, DO  guaiFENesin-dextromethorphan (ROBITUSSIN DM) 100-10 MG/5ML syrup Take 10 mLs by mouth every 4 (four) hours as needed for cough. 01/27/19   Manuella Ghazi, Pratik D, DO  Multiple Vitamin (MULTIVITAMIN WITH MINERALS) TABS tablet Take 1 tablet by mouth daily. 12/25/18   Irene Pap N, DO  MYRBETRIQ 50 MG TB24 tablet Take 50 mg by mouth at bedtime.  01/13/19   [provider]  nutrition supplement, JUVEN, (JUVEN) PACK Take 1 packet by mouth 2 (two) times daily between meals. Patient not taking: Reported on 01/17/2019 12/24/18   Kayleen Memos, DO   Physical Exam: Vitals:   12/21/20 0001 12/21/20 0509 12/21/20 0600 12/21/20 0713  BP: (!) 172/107 (!) 188/89 (!) 176/90   Pulse: 93 87 72   Resp: _0 Temp: 98.6 F (37 C)     TempSrc: Oral     SpO2:  99% 98% 93% 97%   Constitutional: NAD, calm, comfortable Eyes: PERRL, lids and conjunctivae normal ENMT: Mucous membranes are moist. Posterior pharynx clear of any exudate or lesions. Neck: normal, supple, no masses, no thyromegaly Respiratory: clear to auscultation bilaterally, no wheezing, no crackles. Normal respiratory effort. No accessory muscle use.  Cardiovascular: Sinus tachycardia in the 130s and 140s, no murmurs / rubs / gallops. No extremity edema. 2+ pedal pulses. No carotid bruits.  Abdomen: no tenderness, no masses palpated. No hepatosplenomegaly. Bowel sounds positive.  Musculoskeletal: Moderate generalized weakness.  No clubbing / cyanosis. Good ROM, no contractures. Normal muscle tone.  Skin: The skin is dry, hyperkeratotic and scaly. Neurologic: CN 2-12 grossly intact. Sensation intact, DTR normal. Strength 5/5 in all 4.  Psychiatric: Normal judgment and insight. Alert and  oriented x 3. Normal mood.   Labs on Admission: I have personally reviewed following labs and imaging studies  CBC: Recent Labs  Lab 12/20/20 1554  WBC 8.1  NEUTROABS 6.9  HGB 13.8  HCT 43.5  MCV 93.1  PLT 707   Basic Metabolic Panel: Recent Labs  Lab 12/20/20 1554  NA 138  K 3.4*  CL 101  CO2 25  GLUCOSE 143*  BUN 27*  CREATININE 1.22*  CALCIUM 9.3   GFR: CrCl cannot be calculated (Unknown ideal weight.).  Liver Function Tests: Recent Labs  Lab 12/20/20 1554  AST 33  ALT 19  ALKPHOS 100  BILITOT 0.7  PROT 7.6  ALBUMIN 3.7   Urine analysis:    Component Value Date/Time   COLORURINE YELLOW 12/21/2020 0615   APPEARANCEUR CLOUDY (A) 12/21/2020 0615   LABSPEC 1.014 12/21/2020 0615   PHURINE 5.0 12/21/2020 0615   GLUCOSEU NEGATIVE 12/21/2020 0615   HGBUR SMALL (A) 12/21/2020 0615   BILIRUBINUR NEGATIVE 12/21/2020 0615   KETONESUR NEGATIVE 12/21/2020 0615   PROTEINUR 100 (A) 12/21/2020 0615   UROBILINOGEN 0.2 10/30/2014 1728   NITRITE NEGATIVE 12/21/2020 0615    LEUKOCYTESUR LARGE (A) 12/21/2020 0615   Radiological Exams on Admission: No results found.  EKG: Independently reviewed.   Assessment/Plan Principal Problem:   Acute lower UTI (urinary tract infection) Observation/telemetry. Continue gentle IV fluids. Continue ceftriaxone 1 gr IV daily. Follow-up urine culture and sensitivity.  Active Problems:   Hypokalemia Continue potassium supplementation. Magnesium was supplemented.    Volume depletion Replenishing. Follow renal function electrolytes.    Hypertension Continue carvedilol 6.25 mg p.o. BID. Continue amlodipine 2.5 mg p.o. daily.   Monitor BP and heart rate.    General weakness PT evaluation if no improvement with treatment.  DVT prophylaxis: Lovenox SQ. Code Status:   Full code. Family Communication:   Disposition Plan:   Patient is from:  Home.  Anticipated DC to:  Home.  Anticipated DC date:  12/22/2020 or 12/23/2020.  Anticipated DC barriers: Clinical status.  Consults called:  TOC team. Admission status:  Observation/telemetry.  Severity of Illness:  High severity due to presenting with acute generalized weakness in the setting of acute lower UTI.  The patient will be admitted for IV hydration, electrolyte replacement and IV antibiotic therapy.  Reubin Milan MD [facility]  How to contact the [facility] Attending or Consulting provider [place] or covering provider during after hours [place], for this patient?   Check the care team in [facility] and look for a) attending/consulting TR[facility] provider listed and b) the [facility] team listed Log into [email or web address] and use [place]'s universal password to access. If you do not have the password, please contact the hospital operator. Locate the [facility] provider you are looking for under [facility] and page to a number that you can be directly reached. If you still have difficulty reaching the provider, please page the [facility] (Director on Call) for the Hospitalists listed  on amion for assistance.  12/21/2020, 10:43 AM   This document was prepared in Dragon voice recognition software may contain some unintended transcription errors.

## 2020-12-21 NOTE — ED Notes (Signed)
Hospitalist at the bedside at this time ?

## 2020-12-21 NOTE — ED Notes (Signed)
Report given to Genella Rife, RN.

## 2020-12-21 NOTE — Discharge Instructions (Addendum)
Take loperamide (Imodium AD) as needed for diarrhea.  Return if you start vomiting, or start running a fever.  ___________________________________________________ Information on my medicine - ELIQUIS (apixaban)  This medication education was reviewed with me or my healthcare representative as part of my discharge preparation.   Why was Eliquis prescribed for you? Eliquis was prescribed for you to reduce the risk of a blood clot forming that can cause a stroke if you have a medical condition called atrial fibrillation (a type of irregular heartbeat).  What do You need to know about Eliquis ? Take your Eliquis TWICE DAILY - one tablet in the morning and one tablet in the evening with or without food. If you have difficulty swallowing the tablet whole please discuss with your pharmacist how to take the medication safely.  Take Eliquis exactly as prescribed by your doctor and DO NOT stop taking Eliquis without talking to the doctor who prescribed the medication.  Stopping may increase your risk of developing a stroke.  Refill your prescription before you run out.  After discharge, you should have regular check-up appointments with your healthcare provider that is prescribing your Eliquis.  In the future your dose may need to be changed if your kidney function or weight changes by a significant amount or as you get older.  What do you do if you miss a dose? If you miss a dose, take it as soon as you remember on the same day and resume taking twice daily.  Do not take more than one dose of ELIQUIS at the same time to make up a missed dose.  Important Safety Information A possible side effect of Eliquis is bleeding. You should call your healthcare provider right away if you experience any of the following: Bleeding from an injury or your nose that does not stop. Unusual colored urine (red or dark brown) or unusual colored stools (red or black). Unusual bruising for unknown reasons. A  serious fall or if you hit your head (even if there is no bleeding).  Some medicines may interact with Eliquis and might increase your risk of bleeding or clotting while on Eliquis. To help avoid this, consult your healthcare provider or pharmacist prior to using any new prescription or non-prescription medications, including herbals, vitamins, non-steroidal anti-inflammatory drugs (NSAIDs) and supplements.  This website has more information on Eliquis (apixaban): http://www.eliquis.com/eliquis/home

## 2020-12-21 NOTE — ED Notes (Addendum)
This nurse requested, Charge Nurse [facility], to have pt moved into monitored room, given this nurse does not feel the pt is hallway appropriate.  However, no room available at this time. Pt remains in hallway.

## 2020-12-21 NOTE — ED Notes (Signed)
Pt given ginger ale to drink. 

## 2020-12-21 NOTE — ED Notes (Signed)
Attempted to get patient to ambulate with a walker. Patient was unable to sit up on her own, pull herself to a standing position using the walker. Patient assisted to stand, but unable to actually take steps using the walker. Patient was repositioned back in the stretcher chair.

## 2020-12-21 NOTE — ED Notes (Signed)
Patients son would like a call back with an update: Daleah Coulson [phone]

## 2020-12-21 NOTE — ED Provider Notes (Addendum)
[facility] DEPT Provider Note   CSN: [id number] Arrival date & time: 12/20/20  1524     History Chief Complaint  Patient presents with   Diarrhea    [name] is a 87 y.o. female.  The history is provided by the patient and the nursing home.  Diarrhea She has history of hypertension, COPD, chronic diarrhea and was transferred here from a nursing home because of diarrhea for the last 3 days, and generalized weakness.  Patient is a very poor historian and is difficult to get reliable information from her.  Apparently, she has chronic diarrhea secondary to a colon resection and normally takes tincture of opium.  She states that she had stopped taking it but is not sure when.  She did take some today.  She cannot immediately have any bowel movements a day she is having but denies blood in the stool.  She denies abdominal pain, nausea, vomiting.  She denies fever, chills, sweats.  She states she did fall twice yesterday, she denies injury.   Past Medical History:  Diagnosis Date   Anemia    Arthritis    Cancer ([place])    melanoma   Chronic diarrhea    Complication of anesthesia    hallucinations in the past, last surgery was OK anesth.    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   GERD (gastroesophageal reflux disease)    Goiter    Headache(784.0)    sinus   History of blood transfusion 2006   due to colon bleeding    Hypertension    Neuromuscular disorder (HCC)    peripheral neuropathy - both feet    Osteoporosis    Shortness of breath dyspnea    UTI (lower urinary tract infection)    start Tx today /w probiotic- 08/18/2014     Patient Active Problem List   Diagnosis Date Noted   Sepsis ([place]) 01/17/2019   Acute respiratory failure with hypoxia ([place]) 01/17/2019   Pneumonia due to COVID-19 virus 01/17/2019   Pulmonary nodule 01/17/2019   Thoracic aortic aneurysm  01/17/2019   Fall 12/21/2018   Weakness 12/20/2018   Pressure injury of skin 12/20/2018   Meningioma ([place]) 12/20/2018   AKI (acute kidney injury) ([place]) 12/20/2018   Fall at home, initial encounter 04/19/2017   Bilateral hip dislocation ([place]) 04/19/2017   Uncontrolled pain 04/19/2017   Hypertension 04/19/2017   Leukocytosis 04/19/2017   Elevated troponin 04/19/2017   Osteoarthritis of hand 01/01/2016   Osteoarthritis of foot 01/01/2016   Unspecified osteoarthritis, unspecified site 01/01/2016   S/p reverse total shoulder arthroplasty 08/27/2014   Chronic diarrhea 12/22/2011    Past Surgical History:  Procedure Laterality Date   ABDOMINAL HYSTERECTOMY     APPENDECTOMY     BALLOON DILATION  04/19/2011   Procedure: BALLOON DILATION;  Surgeon: Landry Dyke, MD;  Location: [facility];  Service: Endoscopy;  Laterality: N/A;   BLADDER SURGERY     COLON SURGERY  2006   Dr. Zella Richer- due to diverticulitis   ESOPHAGOGASTRODUODENOSCOPY  04/19/2011   Procedure: ESOPHAGOGASTRODUODENOSCOPY (EGD);  Surgeon: Landry Dyke, MD;  Location: [facility];  Service: Endoscopy;  Laterality: N/A;  mac    hip replacement-bilateral     JOINT REPLACEMENT     REVERSE SHOULDER ARTHROPLASTY Left 08/27/2014   Procedure: REVERSE LEFT SHOULDER ARTHROPLASTY;  Surgeon: Justice Britain, MD;  Location: [facility]  OR;  Service: Orthopedics;  Laterality: Left;   TOTAL KNEE ARTHROPLASTY Bilateral      OB History     Gravida      Para      Term      Preterm      AB      Living  1      SAB      IAB      Ectopic      Multiple      Live Births              Family History  Problem Relation Age of Onset   Heart attack Mother    Stroke Father     Social History   Tobacco Use   Smoking status: Former    Types: Cigarettes    Quit date: 02/28/1968    Years since quitting: 52.8   Smokeless tobacco: Never  Substance Use Topics   Alcohol use: Yes    Comment: rarely   Drug use: No     Home Medications Prior to Admission medications   Medication Sig Start Date End Date Taking? Authorizing Provider  albuterol (VENTOLIN HFA) 108 (90 Base) MCG/ACT inhaler Inhale 2-4 puffs into the lungs every 4 (four) hours as needed for wheezing or shortness of breath. 01/27/19   Manuella Ghazi, Pratik D, DO  amLODipine (NORVASC) 10 MG tablet Take 1 tablet (10 mg total) by mouth daily. Patient not taking: Reported on 01/17/2019 12/25/18   Kayleen Memos, DO  diphenoxylate-atropine (LOMOTIL) 2.5-0.025 MG tablet Take 1 tablet by mouth 3 (three) times daily before meals. 01/28/19   Manuella Ghazi, Pratik D, DO  Ensure Plus (ENSURE PLUS) LIQD Take 237 mLs by mouth 3 (three) times daily with meals.    [provider]  glycopyrrolate (ROBINUL) 1 MG tablet Take 1 tablet (1 mg total) by mouth 3 (three) times daily before meals. 12/24/18   Kayleen Memos, DO  guaiFENesin-dextromethorphan (ROBITUSSIN DM) 100-10 MG/5ML syrup Take 10 mLs by mouth every 4 (four) hours as needed for cough. 01/27/19   Manuella Ghazi, Pratik D, DO  Multiple Vitamin (MULTIVITAMIN WITH MINERALS) TABS tablet Take 1 tablet by mouth daily. 12/25/18   Irene Pap N, DO  MYRBETRIQ 50 MG TB24 tablet Take 50 mg by mouth at bedtime.  01/13/19   [provider]  nutrition supplement, JUVEN, (JUVEN) PACK Take 1 packet by mouth 2 (two) times daily between meals. Patient not taking: Reported on 01/17/2019 12/24/18   Kayleen Memos, DO    Allergies    Hydromorphone, Lisinopril, Mirtazapine, Nitrofuran derivatives, Septra [bactrim], and Sulfamethoxazole-trimethoprim  Review of Systems   Review of Systems  Gastrointestinal:  Positive for diarrhea.  All other systems reviewed and are negative.  Physical Exam Updated Vital Signs BP (!) 172/107   Pulse 93   Temp 98.6 F (37 C) (Oral)   Resp 12   SpO2 99%   Physical Exam Vitals and nursing note reviewed.  87 year old female, resting comfortably and in no acute distress. Vital signs are  significant for elevated blood pressure. Oxygen saturation is 99%, which is normal. Head is normocephalic and atraumatic. PERRLA, EOMI. Oropharynx is clear.  Mucous membranes are dry. Neck is nontender and supple without adenopathy or JVD. Back is nontender and there is no CVA tenderness. Lungs are clear without rales, wheezes, or rhonchi. Chest is nontender. Heart has regular rate and rhythm without murmur. Abdomen is soft, flat, nontender without masses or hepatosplenomegaly and peristalsis is  hypoactive. Extremities have no cyanosis or edema, full range of motion is present. Skin is warm and dry without rash.  Skin turgor is decreased. Neurologic: Mental status is normal, cranial nerves are intact, moves all extremities equally.  ED Results / Procedures / Treatments   Labs (all labs ordered are listed, but only abnormal results are displayed) Labs Reviewed  CBC WITH DIFFERENTIAL/PLATELET - Abnormal; Notable for the following components:      Result Value   Lymphs Abs 0.5 (*)    All other components within normal limits  COMPREHENSIVE METABOLIC PANEL - Abnormal; Notable for the following components:   Potassium 3.4 (*)    Glucose, Bld 143 (*)    BUN 27 (*)    Creatinine, Ser 1.22 (*)    GFR, Estimated 43 (*)    All other components within normal limits  URINALYSIS, ROUTINE W REFLEX MICROSCOPIC - Abnormal; Notable for the following components:   APPearance CLOUDY (*)    Hgb urine dipstick SMALL (*)    Protein, ur 100 (*)    Leukocytes,Ua LARGE (*)    WBC, UA >50 (*)    Bacteria, UA MANY (*)    All other components within normal limits  LIPASE, BLOOD   Procedures Procedures   Medications Ordered in ED Medications  lactated ringers bolus 1,000 mL (1,000 mLs Intravenous New Bag/Given 12/21/20 0304)  loperamide (IMODIUM) capsule 4 mg (4 mg Oral Given 12/21/20 0340)  potassium chloride 10 mEq in 100 mL IVPB (0 mEq Intravenous Stopped 12/21/20 0500)    ED Course  I have  reviewed the triage vital signs and the nursing notes.  Pertinent labs & imaging results that were available during my care of the patient were reviewed by me and considered in my medical decision making (see chart for details).   MDM Rules/Calculators/A&P                         Diarrhea and generalized weakness.  Patient does appear clinically dehydrated.  Labs do show creatinine increased compared with baseline, but BUN is actually decreased.  Mild hypokalemia is present, probably related to diarrhea.  She is given a dose of oral potassium.  WBC is normal.  Hemoglobin is increased compared with baseline, consistent with dehydration.  We will also need to check urinalysis to make sure she does not have an occult urinary tract infection.  She will be given IV fluids and a dose of oral loperamide.  Old records are reviewed, and she does have a prior ED visit for chronic diarrhea.  She has had no further diarrhea while in the emergency department.  She is discharged and advised to continue using loperamide as needed.  Follow-up with primary care provider as needed.  Return precautions discussed.  Final Clinical Impression(s) / ED Diagnoses Final diagnoses:  Diarrhea, unspecified type  Hypokalemia    Rx / DC Orders ED Discharge Orders          Ordered    loperamide (IMODIUM) 2 MG capsule  4 times daily PRN        12/21/20 9518             Delora Fuel, MD 84/16/60 [phone]  Urinalysis has come back with greater than 50 WBCs and many bacteria.  However, patient is not having any urinary symptoms.  With chronic diarrhea, I wish to avoid antibiotics when not needed.  Therefore, we will not treat for possible UTI.   Delora Fuel,  MD 12/21/20 [phone]

## 2020-12-21 NOTE — ED Provider Notes (Signed)
I was asked to see patient as patient was getting ready for discharge with [facility] but is too weak.  She lives in independent living and normally walks with a walker.  We tried to get her up to walk but she could not get up even with assistance.  She denies urinary complaints but the diarrhea sounds like an on and off problem and with her new weakness and large leukocytes/greater than 50 WBCs/many bacteria I think this is probably a symptomatic UTI that will need antibiotics and admission.  She will likely need PT and is unsafe to go back to her independent living facility. Discussed with Dr. Olevia Bowens for admission.   Sherwood Gambler, MD 12/21/20 [phone]

## 2020-12-21 NOTE — ED Notes (Signed)
Pt moved from hallway bed to monitored ED exam room, at the request of this nurse. Pt's HR noted to be 140, VS otherwise stable. Hospitalist notified. Awaiting further orders at this time. Will continue to monitor.

## 2020-12-22 ENCOUNTER — Observation Stay (HOSPITAL_COMMUNITY): Payer: Medicare Other

## 2020-12-22 DIAGNOSIS — E43 Unspecified severe protein-calorie malnutrition: Secondary | ICD-10-CM | POA: Diagnosis not present

## 2020-12-22 DIAGNOSIS — K529 Noninfective gastroenteritis and colitis, unspecified: Secondary | ICD-10-CM | POA: Diagnosis present

## 2020-12-22 DIAGNOSIS — I110 Hypertensive heart disease with heart failure: Secondary | ICD-10-CM | POA: Diagnosis present

## 2020-12-22 DIAGNOSIS — Z1624 Resistance to multiple antibiotics: Secondary | ICD-10-CM | POA: Diagnosis present

## 2020-12-22 DIAGNOSIS — I480 Paroxysmal atrial fibrillation: Secondary | ICD-10-CM | POA: Diagnosis present

## 2020-12-22 DIAGNOSIS — Z85820 Personal history of malignant melanoma of skin: Secondary | ICD-10-CM | POA: Diagnosis not present

## 2020-12-22 DIAGNOSIS — J189 Pneumonia, unspecified organism: Secondary | ICD-10-CM | POA: Diagnosis present

## 2020-12-22 DIAGNOSIS — I4891 Unspecified atrial fibrillation: Secondary | ICD-10-CM

## 2020-12-22 DIAGNOSIS — B9620 Unspecified Escherichia coli [E. coli] as the cause of diseases classified elsewhere: Secondary | ICD-10-CM | POA: Diagnosis present

## 2020-12-22 DIAGNOSIS — Z681 Body mass index (BMI) 19 or less, adult: Secondary | ICD-10-CM | POA: Diagnosis not present

## 2020-12-22 DIAGNOSIS — E860 Dehydration: Secondary | ICD-10-CM | POA: Diagnosis present

## 2020-12-22 DIAGNOSIS — D649 Anemia, unspecified: Secondary | ICD-10-CM | POA: Diagnosis present

## 2020-12-22 DIAGNOSIS — E876 Hypokalemia: Secondary | ICD-10-CM | POA: Diagnosis present

## 2020-12-22 DIAGNOSIS — Z87891 Personal history of nicotine dependence: Secondary | ICD-10-CM | POA: Diagnosis not present

## 2020-12-22 DIAGNOSIS — R54 Age-related physical debility: Secondary | ICD-10-CM | POA: Diagnosis present

## 2020-12-22 DIAGNOSIS — I10 Essential (primary) hypertension: Secondary | ICD-10-CM | POA: Diagnosis not present

## 2020-12-22 DIAGNOSIS — N390 Urinary tract infection, site not specified: Secondary | ICD-10-CM | POA: Diagnosis present

## 2020-12-22 DIAGNOSIS — N3000 Acute cystitis without hematuria: Secondary | ICD-10-CM | POA: Diagnosis not present

## 2020-12-22 DIAGNOSIS — Z7901 Long term (current) use of anticoagulants: Secondary | ICD-10-CM | POA: Diagnosis not present

## 2020-12-22 DIAGNOSIS — Z9049 Acquired absence of other specified parts of digestive tract: Secondary | ICD-10-CM | POA: Diagnosis not present

## 2020-12-22 DIAGNOSIS — Z8616 Personal history of COVID-19: Secondary | ICD-10-CM | POA: Diagnosis not present

## 2020-12-22 DIAGNOSIS — R197 Diarrhea, unspecified: Secondary | ICD-10-CM | POA: Diagnosis not present

## 2020-12-22 DIAGNOSIS — I5021 Acute systolic (congestive) heart failure: Secondary | ICD-10-CM | POA: Diagnosis not present

## 2020-12-22 DIAGNOSIS — J440 Chronic obstructive pulmonary disease with acute lower respiratory infection: Secondary | ICD-10-CM | POA: Diagnosis present

## 2020-12-22 DIAGNOSIS — R296 Repeated falls: Secondary | ICD-10-CM | POA: Diagnosis present

## 2020-12-22 DIAGNOSIS — I4892 Unspecified atrial flutter: Secondary | ICD-10-CM | POA: Diagnosis present

## 2020-12-22 DIAGNOSIS — Z79899 Other long term (current) drug therapy: Secondary | ICD-10-CM | POA: Diagnosis not present

## 2020-12-22 DIAGNOSIS — Z96653 Presence of artificial knee joint, bilateral: Secondary | ICD-10-CM | POA: Diagnosis present

## 2020-12-22 LAB — GASTROINTESTINAL PANEL BY PCR, STOOL (REPLACES STOOL CULTURE)

## 2020-12-22 LAB — ECHOCARDIOGRAM COMPLETE
AR max vel: 2.07 cm2
AV Peak grad: 6 mmHg
Ao pk vel: 1.22 m/s
Area-P 1/2: 5.27 cm2
Calc EF: 41.6 %
MV M vel: 5.04 m/s
MV Peak grad: 101.6 mmHg
S' Lateral: 3 cm
Single Plane A2C EF: 40.3 %
Single Plane A4C EF: 42 %

## 2020-12-22 LAB — BASIC METABOLIC PANEL
Anion gap: 7 (ref 5–15)
BUN: 24 mg/dL — ABNORMAL HIGH (ref 8–23)
CO2: 27 mmol/L (ref 22–32)
Calcium: 8.3 mg/dL — ABNORMAL LOW (ref 8.9–10.3)
Chloride: 99 mmol/L (ref 98–111)
Creatinine, Ser: 0.91 mg/dL (ref 0.44–1.00)
GFR, Estimated: >60 mL/min (ref >60)
Glucose, Bld: 95 mg/dL (ref 70–99)
Potassium: 3.7 mmol/L (ref 3.5–5.1)
Sodium: 133 mmol/L — ABNORMAL LOW (ref 135–145)

## 2020-12-22 LAB — TSH: TSH: 1.489 u[IU]/mL (ref 0.350–4.500)

## 2020-12-22 MED ORDER — DILTIAZEM HCL-DEXTROSE 125-5 MG/125ML-% IV SOLN (PREMIX)
5.0000 mg/h | INTRAVENOUS | Status: DC
Start: 2020-12-22 — End: 2020-12-23
  Administered 2020-12-22 (×2): 15 mg/h via INTRAVENOUS
  Administered 2020-12-22: 5 mg/h via INTRAVENOUS
  Filled 2020-12-22 (×3): qty 125

## 2020-12-22 NOTE — ED Notes (Signed)
Pt given meal tray.

## 2020-12-22 NOTE — ED Notes (Signed)
Pulse ox moved to right foot

## 2020-12-22 NOTE — Consult Note (Addendum)
Cardiology Consultation:   Patient ID: [name] MRN: [id number]; DOB: Jan 25, 1931  Admit date: 12/20/2020 Date of Consult: 12/22/2020  PCP:  Rachael Manes, MD   [facility] Providers Cardiologist:  Rachael Klein, MD new  Patient Profile:   [name] is a 87 y.o. female with a hx of COPD, HTN, OA, hx of melanoma, GERD, depression, and peripheral neuropathy who is being seen 12/22/2020 for the evaluation of new onset AFib at the request of Dr. Lupita [name].  History of Present Illness:   [name] has no prior cardiac history. She was hospitalized in 12/2018 with COVID-19 and discharged to SNF due to deconditioning. She has a history of colectomy due to bleeding approximately 6 years ago. She follows with [name]. She has had no further bleeding since, but continues to have chronic diarrhea controlled with opium tincture.   She presented to [facility] with three days of diarrhea and worsening weakness. She states her opium tincture did not come in the mail and she has been having worsening diarrhea. She fell on Sunday twice due to lightheadedness. She has had reduced PO intake. On arrival, labs consistent with dehydration and positive for UTI. She was admitted for ABX and deconditioning. She was waiting a bed in the hallway and pulse noted in the 140s. She was moved to a monitored bed and telemetry showed irregular tachycardia. Cardiology was consulted.   Telemetry reviewed: artifact makes definitive interpretation difficult, but appears to have converted to Afib/flutter at 0737 this morning. Evidence of 2:1 fllutter and possibly episodes of Afib. Fortunately, she is completely  unaware of her rhythm. She denies chest pain, shortness of breath, palpitations, and syncope. I [name] not completely understand the circumstances of her falls on Sunday due to difficulty gathering history.    Past Medical History:  Diagnosis Date   Anemia    Arthritis    Cancer ([place])    melanoma   Chronic diarrhea     Complication of anesthesia    hallucinations in the past, last surgery was OK anesth.    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   GERD (gastroesophageal reflux disease)    Goiter    Headache(784.0)    sinus   History of blood transfusion 2006   due to colon bleeding    Hypertension    Neuromuscular disorder (HCC)    peripheral neuropathy - both feet    Osteoporosis    Shortness of breath dyspnea    UTI (lower urinary tract infection)    start Tx today /w probiotic- 08/18/2014     Past Surgical History:  Procedure Laterality Date   ABDOMINAL HYSTERECTOMY     APPENDECTOMY     BALLOON DILATION  04/19/2011   Procedure: BALLOON DILATION;  Surgeon: Rachael Dyke, MD;  Location: [facility];  Service: Endoscopy;  Laterality: N/A;   BLADDER SURGERY     COLON SURGERY  2006   Dr. Zella [name]- due to diverticulitis   ESOPHAGOGASTRODUODENOSCOPY  04/19/2011   Procedure: ESOPHAGOGASTRODUODENOSCOPY (EGD);  Surgeon: Rachael Dyke, MD;  Location: [facility];  Service: Endoscopy;  Laterality: N/A;  mac    hip replacement-bilateral     JOINT REPLACEMENT     REVERSE SHOULDER ARTHROPLASTY Left 08/27/2014   Procedure: REVERSE LEFT SHOULDER ARTHROPLASTY;  Surgeon: Rachael Britain, MD;  Location: [facility];  Service: Orthopedics;  Laterality: Left;   TOTAL KNEE ARTHROPLASTY Bilateral  Home Medications:  Prior to Admission medications   Medication Sig Start Date End Date Taking? Authorizing Provider  acetaminophen (TYLENOL) 650 MG CR tablet Take 650 mg by mouth every 8 (eight) hours as needed for pain.   Yes [provider]  amLODipine (NORVASC) 2.5 MG tablet Take 2.5 mg by mouth daily. 11/08/20  Yes [provider]  carvedilol (COREG) 6.25 MG tablet Take 6.25 mg by mouth 2 (two) times daily. 11/15/20  Yes [provider]  feeding supplement (BOOST HIGH PROTEIN) LIQD Take 1 Container by mouth  daily.   Yes [provider]  loperamide (IMODIUM) 2 MG capsule Take 1 capsule (2 mg total) by mouth 4 (four) times daily as needed for diarrhea or loose stools. 26/41/58  Yes Rachael Fuel, MD  Multiple Vitamin (MULTIVITAMIN WITH MINERALS) TABS tablet Take 1 tablet by mouth daily. 12/25/18  Yes [name]  Opium 10 MG/ML (1%) TINC Take 0.6 mLs by mouth 4 (four) times daily as needed for diarrhea or loose stools. 10/14/20  Yes [provider]  albuterol (VENTOLIN HFA) 108 (90 Base) MCG/ACT inhaler Inhale 2-4 puffs into the lungs every 4 (four) hours as needed for wheezing or shortness of breath. Patient not taking: No sig reported 01/27/19   [name]  diphenoxylate-atropine (LOMOTIL) 2.5-0.025 MG tablet Take 1 tablet by mouth 3 (three) times daily before meals. Patient not taking: No sig reported 01/28/19   [name]  glycopyrrolate (ROBINUL) 1 MG tablet Take 1 tablet (1 mg total) by mouth 3 (three) times daily before meals. Patient not taking: No sig reported 12/24/18   [name]    Inpatient Medications: Scheduled Meds:  amLODipine  2.5 mg Oral Daily   carvedilol  6.25 mg Oral BID   enoxaparin (LOVENOX) injection  30 mg Subcutaneous Q24H   Continuous Infusions:  0.9 % NaCl with KCl 20 mEq / L 50 mL/hr at 12/21/20 1614   cefTRIAXone (ROCEPHIN)  IV     lactated ringers Stopped (12/21/20 1626)   PRN Meds: acetaminophen **OR** acetaminophen, ondansetron **OR** ondansetron (ZOFRAN) IV, Opium  Allergies:    Allergies  Allergen Reactions   Hydromorphone     Pt does not remember   Lisinopril     fatigue   Mirtazapine     Pt doesn't remember.     Nitrofuran Derivatives Diarrhea   Septra [Bactrim]     Pt doesn't remember.    Sulfamethoxazole-Trimethoprim     Social History:   Social History   Socioeconomic History   Marital status: Divorced    Spouse name: Not on file   Number of children: Not on file   Years of education: Not  on file   Highest education level: Not on file  Occupational History   Not on file  Tobacco Use   Smoking status: Former    Types: Cigarettes    Quit date: 02/28/1968    Years since quitting: 52.8   Smokeless tobacco: Never  Substance and Sexual Activity   Alcohol use: Yes    Comment: rarely   Drug use: No   Sexual activity: Not on file  Other Topics Concern   Not on file  Social History Narrative   Not on file   Social Determinants of Health   Financial Resource Strain: Not on file  Food Insecurity: Not on file  Transportation Needs: Not on file  Physical Activity: Not on file  Stress: Not on file  Social  Connections: Not on file  Intimate Partner Violence: Not on file    Family History:    Family History  Problem Relation Age of Onset   Heart attack Mother    Stroke Father      ROS:  Please see the history of present illness.   All other ROS reviewed and negative.     Physical Exam/Data:   Vitals:   12/22/20 0200 12/22/20 0720 12/22/20 0730 12/22/20 1015  BP: (!) 143/79 (!) 186/94  (!) 143/99  Pulse: (!) 58 63 71 (!) 135  Resp: _0 Temp:      TempSrc:      SpO2: 92% 96% 94% 96%    Intake/Output Summary (Last 24 hours) at 12/22/2020 1138 Last data filed at 12/21/2020 1730 Gross per 24 hour  Intake 49 ml  Output --  Net 49 ml   Last 3 Weights 01/17/2019 12/22/2018 12/22/2018  Weight (lbs) 113 lb 129 lb 13.6 oz 130 lb  Weight (kg) 51.256 kg 58.9 kg 58.968 kg     There is no height or weight on file to calculate BMI.  General:  thin elderly female in NAD HEENT: normal Neck: no JVD Vascular: No carotid bruits; Distal pulses 2+ bilaterally Cardiac:  irregular rhythm, tachycardic rate, no murmur Lungs:  clear to auscultation bilaterally, no wheezing, rhonchi or rales  Abd: soft, nontender, no hepatomegaly  Ext: no edema Musculoskeletal:  No deformities, BUE and BLE strength normal and equal Skin: warm and dry  Neuro:  CNs 2-12 intact, no  focal abnormalities noted Psych:  Normal affect   EKG:  The EKG was personally reviewed and demonstrates:  atrial fibrillation vs flutter with ventricular rate  Telemetry:  Telemetry was personally reviewed and demonstrates:  sinus rhythm with conversion to fib/flutter at 0737, HR in the 130s  Relevant CV Studies:  Echo pending  Laboratory Data:  High Sensitivity Troponin:  No results for input(s): TROPONINIHS in the last 720 hours.   Chemistry Recent Labs  Lab 12/20/20 1554 12/21/20 1055 12/22/20 0435  NA 138 137 133*  K 3.4* 3.4* 3.7  CL 101 100 99  CO2 _1 GLUCOSE 143* 103* 95  BUN 27* 27* 24*  CREATININE 1.22* 0.96 0.91  CALCIUM 9.3 8.8* 8.3*  MG  --  2.0  --   GFRNONAA 43* 57* >60  ANIONGAP _2 Recent Labs  Lab 12/20/20 1554 12/21/20 1055  PROT 7.6 6.7  ALBUMIN 3.7 3.1*  AST 33 22  ALT 19 17  ALKPHOS 100 82  BILITOT 0.7 0.5   Lipids No results for input(s): CHOL, TRIG, HDL, LABVLDL, LDLCALC, CHOLHDL in the last 168 hours.  Hematology Recent Labs  Lab 12/20/20 1554 12/21/20 1055  WBC 8.1 6.8  RBC 4.67 4.20  HGB 13.8 12.5  HCT 43.5 38.2  MCV 93.1 91.0  MCH 29.6 29.8  MCHC 31.7 32.7  RDW 13.6 13.5  PLT 212 214   Thyroid No results for input(s): TSH, FREET4 in the last 168 hours.  BNPNo results for input(s): BNP, PROBNP in the last 168 hours.  DDimer No results for input(s): DDIMER in the last 168 hours.   Radiology/Studies:  No results found.   Assessment and Plan:   Question Atrial fibrillation vs flutter with RVR - home coreg restarted - has received 2.5 mg IV lopressor x 1 dose - she is unaware of her rhythm, unclear if she may have been in RVR on  Sunday when she fell - despite chronic diarrhea, Mg and K WNL - TSH pending - recommend starting IV cardizem -can stop amlodipine and increase coreg as tolerated - hopefully arrhythmia and RVR will resolve as infection is treated - will review telemetry with attending - consider  obtaining echocardiogram   Need for anticoagulation - she has a history of prior GI bleeding resolved with colectomy - she reports no falls prior to her 2 falls on Sunday - circumstances surrounding falls incompletely understood - anticoagulation has not been started by primary due to bleeding concerns This patients CHA2DS2-VASc Score and unadjusted Ischemic Stroke Rate (% per year) is equal to 4.8 % stroke rate/year from a score of 4 (HTN, female, 2age) - appreciate recommendations for [facility] from [name] - if she can be anticoagulated and she does not convert, may be a candidate for DCCV after 3 weeks of anticoagulation   Hypertension - maintained on 2.5 mg amlodipien and 6.25 mg coreg BID at home - pressure has been elevated here   Chronic diarrhea Weight loss - opium tincture has been restarted    Risk Assessment/Risk Scores:     CHA2DS2-VASc Score = 4   This indicates a 4.8% annual risk of stroke. The patient's score is based upon: CHF History: 0 HTN History: 1 Diabetes History: 0 Stroke History: 0 Vascular Disease History: 0 Age Score: 2 Gender Score: 1      For questions or updates, please contact [place] Please consult [email or web address] for contact info under    Signed, Ledora Bottcher, PA  12/22/2020 11:38 AM  I have seen and examined the patient along with Ledora Bottcher, PA .  I have reviewed the chart, notes and new data.  I agree with PA/NP's note.  Key new complaints: unaware of palpitations. Denies dizziness, angina or dyspnea at rest. Eating lunch. Key examination changes: looks very comfortable, HR 120s, irregular, but with periods of regularization suggesting atrial flutter. Clear lungs. Key new findings / data: telemetry suggests periods of atrial flutter and 2:1 AV conduction, but mostly in atrial fibrillation with RVR.  Bedside review of echocardiogram shows normal left and right ventricular systolic function, normal right and left  atrial sizes, the absence of major valvular abnormalities (2+ TR, 2+ MR) and relatively organized atrial activity (there are distinct atrial filling "kicks" on the mitral inflow consistent with atrial flutter.  All of these places her at relatively lower risk of arrhythmia recurrence and stroke, but nevertheless the CHA2DS2-VASc score is high (4 for age, hypertension, gender).  PLAN: Transition amlodipine to IV diltiazem for faster rate control and also increase carvedilol dose. At prior visits in office her baseline heart rate  in SR appears to be high 70s-high 80s and BP normal to high. She is elderly and very thin and has had previous GI bleeding (not recently and source of bleeding may have been removed with her colectomy). If we proceed with anticoagulation, would use Eliquis 2.5 mg twice daily.  Would indeed appreciate GI opinion regarding safety of long-term anticoagulation.  Rachael Klein, MD, [place] [phone] 12/22/2020, 4:08 PM

## 2020-12-22 NOTE — ED Notes (Signed)
IP nurse given report, patient ready to go to room.

## 2020-12-22 NOTE — Plan of Care (Signed)
  Problem: Clinical Measurements: Goal: Ability to maintain clinical measurements within normal limits will improve Outcome: Progressing Goal: Respiratory complications will improve Outcome: Progressing   

## 2020-12-22 NOTE — Progress Notes (Signed)
PROGRESS NOTE    [name]  FHL:[id number] DOB: 1930/08/03 DOA: 12/20/2020 PCP: Lajean Manes, MD   Chief Complaint  Patient presents with   Diarrhea   Brief Narrative/Hospital Course:  [name], 87 y.o. female with PMH of anemia, osteoarthritis, cancer, chronic diarrhea, COPD, depression, GERD, goiter, headache, hypertension, unspecified peripheral neuropathy, osteoporosis, history of UTI presented from assisted living facility with complaint of generalized weakness, diarrhea x3 days. In the ED vitals fairly stable, although had episode of tachycardia in 130s to 140s, given IV fluids, labs showed UA consistent with UTI, mild hypokalemia creatinine 1.2. Patient is kept on IV fluids and antibiotics and admitted.   Subjective: Seen and examined this morning in the ED. C/o chronic diarrhea-states she takes opium before eating, reports she has no colon- had sugrey 6 yrs ago Overnight no fever Blood pressure accelerated 140s to 180s. Monitor showing A. fib in 130s 140s at rest  Assessment & Plan:  Acute lower UTI : Afebrile, no leukocytosis.  Clinically patient stable, continue On ceftriaxone.  Follow-up urine culture. Recent Labs  Lab 12/20/20 1554 12/21/20 1055  WBC 8.1 6.8    Tachycardia- EKG? A flutter vs Fib with RVR, also with artifact. Cont her coreg.  Monitor on telemetry check tsh, cards eval requested.  Starting on Cardizem drip, getting echocardiogram.  Wt loss:suspect malnutrition has had poor appetite-asked ED nurse to check weight and height and document today. RD consulted  Diarrhea: seems chronic since her colon surgery, on chronic opiates. Negative for C. Difficile and GI panel.  Symptomatic management  Generalized weakness/debility/deconditioning: PTOT eval.  Hypertension: Blood pressure poorly controlled on amlodipine and Coreg, add as needed meds  Hypokalemia-k improved to 3.7  Volume depletion: we will cont IV fluids, encourage p.o.  Pressure injury  stage II on coccyx POA see below Pressure Injury 12/20/18 Coccyx Medial Stage II -  Partial thickness loss of dermis presenting as a shallow open ulcer with a red, pink wound bed without slough. pink, open skin, non-blanchable (Active)  12/20/18 1415  Location: Coccyx  Location Orientation: Medial  Staging: Stage II -  Partial thickness loss of dermis presenting as a shallow open ulcer with a red, pink wound bed without slough.  Wound Description (Comments): pink, open skin, non-blanchable  Present on Admission: Yes   DVT prophylaxis: enoxaparin (LOVENOX) injection 30 mg Start: 12/21/20 1045 Code Status:   Code Status: Full Code Family Communication: plan of care discussed with patient at bedside. Status is: Inpatient Lives at [address] [facility] Patient remains hospitalized will need at least 2 midnight stay for ongoing management of her tachycardia, UTI generalized weakness, changed to inpatient status  Objective: Vitals last 24 hrs: Vitals:   12/22/20 0130 12/22/20 0200 12/22/20 0720 12/22/20 0730  BP: (!) 169/89 (!) 143/79 (!) 186/94   Pulse: 60 (!) 58 63 71  Resp:  18 18   Temp:      TempSrc:      SpO2: 98% 92% 96% 94%   Weight change:   Intake/Output Summary (Last 24 hours) at 12/22/2020 0920 Last data filed at 12/21/2020 1730 Gross per 24 hour  Intake 149 ml  Output --  Net 149 ml   Net IO Since Admission: 149 mL [12/22/20 0920]   Physical Examination: General exam: Aa0x3, weak, thin, frail, on RA HEENT:Oral mucosa moist, Ear/Nose WNL grossly,dentition normal. Respiratory system: B/l CLEAR BS, no use of accessory muscle, non tender. Cardiovascular system: S1 & S2 +,No JVD. Gastrointestinal system: Abdomen soft,SCAPHOID,NT,ND,  BS+. Nervous System:Alert, awake, moving extremities. Extremities: edema none, distal peripheral pulses palpable.  Skin: No rashes, no icterus. MSK: Normal muscle bulk, tone, power.  Medications reviewed:  Scheduled Meds:  amLODipine   2.5 mg Oral Daily   carvedilol  6.25 mg Oral BID   enoxaparin (LOVENOX) injection  30 mg Subcutaneous Q24H   Continuous Infusions:  0.9 % NaCl with KCl 20 mEq / L 50 mL/hr at 12/21/20 1614   cefTRIAXone (ROCEPHIN)  IV     lactated ringers Stopped (12/21/20 1626)   Diet Order             Diet Heart Room service appropriate? Yes; Fluid consistency: Thin  Diet effective now                 Weight change:   Wt Readings from Last 3 Encounters:  01/17/19 51.3 kg  12/22/18 58.9 kg  04/19/17 54.8 kg     Consultants:see note  Procedures:see note Antimicrobials: Anti-infectives (From admission, onward)    Start     Dose/Rate Route Frequency Ordered Stop   12/22/20 1200  cefTRIAXone (ROCEPHIN) 1 g in sodium chloride 0.9 % 100 mL IVPB        1 g 200 mL/hr over 30 Minutes Intravenous Every 24 hours 12/21/20 1953     12/21/20 1800  metroNIDAZOLE (FLAGYL) IVPB 500 mg  Status:  Discontinued        500 mg 100 mL/hr over 60 Minutes Intravenous Every 12 hours 12/21/20 1750 12/21/20 1750   12/21/20 1015  cefTRIAXone (ROCEPHIN) 1 g in sodium chloride 0.9 % 100 mL IVPB        1 g 200 mL/hr over 30 Minutes Intravenous  Once 12/21/20 1008 12/21/20 1134      Culture/Microbiology    Component Value Date/Time   SDES  01/16/2019 2156    BLOOD RIGHT ANTECUBITAL Performed at [facility], [place] [address], [place], [place] [address]    [place]  01/16/2019 2156    BOTTLES DRAWN AEROBIC AND ANAEROBIC Blood Culture adequate volume Performed at [facility] [address] [place], [place] [address]    CULT  01/16/2019 2156    NO GROWTH 5 DAYS Performed at [facility] [address] [place],  [address]    REPTSTATUS 01/21/2019 FINAL 01/16/2019 2156    Other culture-see note  Unresulted Labs (From admission, onward)     Start     Ordered   12/28/20 0500  Creatinine, serum  (enoxaparin (LOVENOX)    CrCl < 30 ml/min)  Weekly,   R      Comments: while on enoxaparin therapy.    12/21/20 1039   12/21/20 1008  Urine Culture  Once,   STAT       Question:  Indication  Answer:  Dysuria   12/21/20 1007   12/21/20 1007  Gastrointestinal Panel by PCR , Stool  (Gastrointestinal Panel by PCR, Stool                                                                                                                                                     **  Does Not include CLOSTRIDIUM DIFFICILE testing. **If CDIFF testing is needed, place order from the "C Difficile Testing" order set.**)  Once,   STAT        12/21/20 1007           Data Reviewed: I have personally reviewed following labs and imaging studies CBC: Recent Labs  Lab 12/20/20 1554 12/21/20 1055  WBC 8.1 6.8  NEUTROABS 6.9  --   HGB 13.8 12.5  HCT 43.5 38.2  MCV 93.1 91.0  PLT 212 944   Basic Metabolic Panel: Recent Labs  Lab 12/20/20 1554 12/21/20 1055 12/22/20 0435  NA 138 137 133*  K 3.4* 3.4* 3.7  CL 101 100 99  CO2 25 26 27   GLUCOSE 143* 103* 95  BUN 27* 27* 24*  CREATININE 1.22* 0.96 0.91  CALCIUM 9.3 8.8* 8.3*  MG  --  2.0  --   PHOS  --  2.9  --    GFR: CrCl cannot be calculated (Unknown ideal weight.). Liver Function Tests: Recent Labs  Lab 12/20/20 1554 12/21/20 1055  AST 33 22  ALT 19 17  ALKPHOS 100 82  BILITOT 0.7 0.5  PROT 7.6 6.7  ALBUMIN 3.7 3.1*   Recent Labs  Lab 12/20/20 1554  LIPASE 38   No results for input(s): AMMONIA in the last 168 hours. Coagulation Profile: No results for input(s): INR, PROTIME in the last 168 hours. Cardiac Enzymes: No results for input(s): CKTOTAL, CKMB, CKMBINDEX, TROPONINI in the last 168 hours. BNP (last 3 results) No results for input(s): PROBNP in the last 8760 hours. HbA1C: No results for input(s): HGBA1C in the last 72 hours. CBG: No results for input(s): GLUCAP in the last 168 hours. Lipid Profile: No results for input(s): CHOL, HDL, LDLCALC, TRIG, CHOLHDL, LDLDIRECT in the last  72 hours. Thyroid Function Tests: No results for input(s): TSH, T4TOTAL, FREET4, T3FREE, THYROIDAB in the last 72 hours. Anemia Panel: No results for input(s): VITAMINB12, FOLATE, FERRITIN, TIBC, IRON, RETICCTPCT in the last 72 hours. Sepsis Labs: No results for input(s): PROCALCITON, LATICACIDVEN in the last 168 hours.  Recent Results (from the past 240 hour(s))  Resp Panel by RT-PCR (Flu A&B, Covid) Nasopharyngeal Swab     Status: None   Collection Time: 12/21/20 10:54 AM   Specimen: Nasopharyngeal Swab; Nasopharyngeal(NP) swabs in vial transport medium  Result Value Ref Range Status   SARS Coronavirus 2 by RT PCR NEGATIVE NEGATIVE Final    Comment: (NOTE) SARS-CoV-2 target nucleic acids are NOT DETECTED.  The SARS-CoV-2 RNA is generally detectable in upper respiratory specimens during the acute phase of infection. The lowest concentration of SARS-CoV-2 viral copies this assay can detect is 138 copies/mL. A negative result does not preclude SARS-Cov-2 infection and should not be used as the sole basis for treatment or other patient management decisions. A negative result may occur with  improper specimen collection/handling, submission of specimen other than nasopharyngeal swab, presence of viral mutation(s) within the areas targeted by this assay, and inadequate number of viral copies(<138 copies/mL). A negative result must be combined with clinical observations, patient history, and epidemiological information. The expected result is Negative.  Fact Sheet for Patients:  EntrepreneurPulse.com.au  Fact Sheet for Healthcare Providers:  IncredibleEmployment.be  This test is no t yet approved or cleared by the [place] FDA and  has been authorized for detection and/or diagnosis of SARS-CoV-2 by FDA under an Emergency Use Authorization (EUA). This EUA will remain  in effect (meaning this test can  be used) for the duration of the COVID-19  declaration under Section 564(b)(1) of the Act, 21 U.S.C.section 360bbb-3(b)(1), unless the authorization is terminated  or revoked sooner.       Influenza A by PCR NEGATIVE NEGATIVE Final   Influenza B by PCR NEGATIVE NEGATIVE Final    Comment: (NOTE) The Xpert Xpress SARS-CoV-2/FLU/RSV plus assay is intended as an aid in the diagnosis of influenza from Nasopharyngeal swab specimens and should not be used as a sole basis for treatment. Nasal washings and aspirates are unacceptable for Xpert Xpress SARS-CoV-2/FLU/RSV testing.  Fact Sheet for Patients: EntrepreneurPulse.com.au  Fact Sheet for Healthcare Providers: IncredibleEmployment.be  This test is not yet approved or cleared by the [place] FDA and has been authorized for detection and/or diagnosis of SARS-CoV-2 by FDA under an Emergency Use Authorization (EUA). This EUA will remain in effect (meaning this test can be used) for the duration of the COVID-19 declaration under Section 564(b)(1) of the Act, 21 U.S.C. section 360bbb-3(b)(1), unless the authorization is terminated or revoked.  Performed at [facility], [place] [address], [place], [place] [address]   C Difficile Quick Screen w PCR reflex     Status: None   Collection Time: 12/21/20  4:19 PM   Specimen: Stool  Result Value Ref Range Status   C Diff antigen NEGATIVE NEGATIVE Final   C Diff toxin NEGATIVE NEGATIVE Final   C Diff interpretation No C. difficile detected.  Final    Comment: Performed at [facility], [place] [address] [place], [place] [address]     Radiology Studies: No results found.   LOS: 0 days   Antonieta Pert, MD [facility]  12/22/2020, 9:20 AM

## 2020-12-23 DIAGNOSIS — I10 Essential (primary) hypertension: Secondary | ICD-10-CM | POA: Diagnosis not present

## 2020-12-23 DIAGNOSIS — R197 Diarrhea, unspecified: Secondary | ICD-10-CM

## 2020-12-23 DIAGNOSIS — N390 Urinary tract infection, site not specified: Secondary | ICD-10-CM | POA: Diagnosis not present

## 2020-12-23 DIAGNOSIS — I480 Paroxysmal atrial fibrillation: Secondary | ICD-10-CM

## 2020-12-23 DIAGNOSIS — E876 Hypokalemia: Secondary | ICD-10-CM | POA: Diagnosis not present

## 2020-12-23 DIAGNOSIS — E43 Unspecified severe protein-calorie malnutrition: Secondary | ICD-10-CM

## 2020-12-23 DIAGNOSIS — I5021 Acute systolic (congestive) heart failure: Secondary | ICD-10-CM

## 2020-12-23 DIAGNOSIS — N3000 Acute cystitis without hematuria: Secondary | ICD-10-CM

## 2020-12-23 LAB — BASIC METABOLIC PANEL
Anion gap: 5 (ref 5–15)
BUN: 21 mg/dL (ref 8–23)
CO2: 24 mmol/L (ref 22–32)
Calcium: 8 mg/dL — ABNORMAL LOW (ref 8.9–10.3)
Chloride: 102 mmol/L (ref 98–111)
Creatinine, Ser: 1.06 mg/dL — ABNORMAL HIGH (ref 0.44–1.00)
GFR, Estimated: 51 mL/min — ABNORMAL LOW (ref >60)
Glucose, Bld: 124 mg/dL — ABNORMAL HIGH (ref 70–99)
Potassium: 3.7 mmol/L (ref 3.5–5.1)
Sodium: 131 mmol/L — ABNORMAL LOW (ref 135–145)

## 2020-12-23 LAB — CBC
HCT: 32.4 % — ABNORMAL LOW (ref 36.0–46.0)
Hemoglobin: 10.4 g/dL — ABNORMAL LOW (ref 12.0–15.0)
MCH: 29.5 pg (ref 26.0–34.0)
MCHC: 32.1 g/dL (ref 30.0–36.0)
MCV: 91.8 fL (ref 80.0–100.0)
Platelets: 188 10*3/uL (ref 150–400)
RBC: 3.53 MIL/uL — ABNORMAL LOW (ref 3.87–5.11)
RDW: 13.6 % (ref 11.5–15.5)
WBC: 6.9 10*3/uL (ref 4.0–10.5)
nRBC: 0 % (ref 0.0–0.2)

## 2020-12-23 LAB — URINE CULTURE: Culture: >=100000 — AB

## 2020-12-23 MED ORDER — PROSOURCE PLUS PO LIQD
30.0000 mL | Freq: Two times a day (BID) | ORAL | Status: DC
  Administered 2020-12-24 – 2020-12-30 (×14): 30 mL via ORAL
  Filled 2020-12-23 (×15): qty 30

## 2020-12-23 MED ORDER — OPIUM 10 MG/ML (1%) PO TINC
0.6000 mL | Freq: Three times a day (TID) | ORAL | Status: DC
Start: 2020-12-23 — End: 2020-12-31
  Administered 2020-12-23 – 2020-12-30 (×28): 6 mg via ORAL
  Filled 2020-12-23 (×7): qty 1
  Filled 2020-12-23: qty 0.6
  Filled 2020-12-23 (×21): qty 1

## 2020-12-23 MED ORDER — ADULT MULTIVITAMIN W/MINERALS CH
1.0000 | ORAL_TABLET | Freq: Every day | ORAL | Status: DC
  Administered 2020-12-23 – 2020-12-30 (×8): 1 via ORAL
  Filled 2020-12-23 (×8): qty 1

## 2020-12-23 MED ORDER — BOOST / RESOURCE BREEZE PO LIQD CUSTOM
1.0000 | Freq: Two times a day (BID) | ORAL | Status: DC
  Administered 2020-12-24 – 2020-12-30 (×13): 1 via ORAL

## 2020-12-23 MED ORDER — CEFADROXIL 500 MG PO CAPS
500.0000 mg | ORAL_CAPSULE | Freq: Two times a day (BID) | ORAL | Status: AC
  Administered 2020-12-24 – 2020-12-27 (×8): 500 mg via ORAL
  Filled 2020-12-23 (×9): qty 1

## 2020-12-23 MED ORDER — APIXABAN 2.5 MG PO TABS
2.5000 mg | ORAL_TABLET | Freq: Two times a day (BID) | ORAL | Status: DC
  Administered 2020-12-23 – 2020-12-30 (×15): 2.5 mg via ORAL
  Filled 2020-12-23 (×15): qty 1

## 2020-12-23 MED ORDER — LOSARTAN POTASSIUM 25 MG PO TABS
12.5000 mg | ORAL_TABLET | Freq: Every day | ORAL | Status: DC
  Administered 2020-12-23 – 2020-12-30 (×8): 12.5 mg via ORAL
  Filled 2020-12-23 (×8): qty 1

## 2020-12-23 NOTE — Progress Notes (Signed)
PROGRESS NOTE    [name]  YKD:[id number] DOB: Feb 15, 1931 DOA: 12/20/2020 PCP: Lajean Manes, MD   Chief Complaint  Patient presents with   Diarrhea    Brief Narrative: [name], 87 y.o. female with PMH of anemia, osteoarthritis, cancer, chronic diarrhea, COPD, depression, GERD, goiter, headache, hypertension, unspecified peripheral neuropathy, osteoporosis, history of UTI presented from assisted living facility with complaint of generalized weakness, diarrhea x3 days. In the ED vitals fairly stable, although had episode of tachycardia in 130s to 140s, given IV fluids, labs showed UA consistent with UTI, mild hypokalemia creatinine 1.2. Patient is kept on IV fluids and antibiotics and admitted   Assessment & Plan:   Principal Problem:   Acute lower UTI (urinary tract infection) Active Problems:   Hypertension   General weakness   Hypokalemia   Volume depletion   UTI (urinary tract infection)   Protein-calorie malnutrition, severe  #1 E. coli UTI -Afebrile.  Normal white count. -Improving clinically. -Urine cultures with E. coli UTI resistant to ampicillin, Bactrim, Zosyn.  Sensitive to the cephalosporins, fluoroquinolones, gentamicin, imipenem, Bactrim. -We will transition from IV Rocephin after today's dose and start patient on oral cefadroxil tomorrow to complete course of antibiotic treatment.  2.  A flutter/A. fib with RVR CHA2DS2VASC score 4 -Patient noted to have converted to normal sinus rhythm this morning after being on Cardizem drip.. -2D echo with EF of 40 to 45%, global hypokinesis moderate LVH.  Right ventricular systolic function normal.  Left atrial size mildly dilated -Patient being followed by cardiology, amlodipine discontinued and patient on Coreg of 6.25 mg twice daily for rate control, patient started on low-dose Eliquis for anticoagulation. -Cardiology recommending a 30-day heart monitor to evaluate for recurrence of A. fib/flutter. -Patient will  need to be closely monitored for falls and anemia. -Per cardiology.  3.  Newly recognized CHF -2D echo with EF of 40 to 45% with global hypokinesis, LVH, mild MR, moderate TR. -Patient clinically does not look volume overloaded on examination and has been having chronic intermittent diarrhea. -Blood pressure currently stable. -On Coreg. -If further blood pressure control is needed cardiology recommending addition of ARB.  4.  Hypertension -Norvasc discontinued. -Continue Coreg. -If further blood pressure control needed cardiology recommending addition of ARB.  5.  Chronic diarrhea -The patient has had diarrhea since her colon surgery and opium tincture usually helps manage it. -Stool studies are negative for C. difficile PCR. -Patient states usually takes a opium tincture before meals which she is not getting during this hospitalization. -Change opium tincture to before meals and at bedtime. -Outpatient follow-up.  6.  Generalized weakness/debility/deconditioning -Patient seen by PT OT who are recommending SNF. -Consult TOC for SNF placement.  7.  Hypokalemia -Repleted.  8.  Severe protein calorie malnutrition -Placed on nutritional supplementation.  9.  Pressure injury stage II on the coccyx, POA Pressure Injury 12/20/18 Coccyx Medial Stage II -  Partial thickness loss of dermis presenting as a shallow open ulcer with a red, pink wound bed without slough. pink, open skin, non-blanchable (Active)  12/20/18 1415  Location: Coccyx  Location Orientation: Medial  Staging: Stage II -  Partial thickness loss of dermis presenting as a shallow open ulcer with a red, pink wound bed without slough.  Wound Description (Comments): pink, open skin, non-blanchable  Present on Admission: Yes         DVT prophylaxis: Eliquis Code Status: Full Family Communication: Updated patient.  No family at bedside. Disposition:   Status  is: Inpatient  Remains inpatient appropriate because:  Ongoing management of patient's tachycardia, UTI, generalized weakness.  Unsafe disposition.       Consultants:  Cardiology: Dr. Sallyanne Kuster 12/23/2018  Procedures:  2D echo 12/22/2020  Antimicrobials:  IV Rocephin 12/21/2020>>>>> 12/23/2020 Cefadroxil 12/24/2020>>>>> 12/28/2020   Subjective: Laying in bed.  Overall feeling better.  States diarrhea improved with resumption of opium tincture however states she is not getting it before meals like she takes it at home.  No chest pain.  No shortness of breath.  States she lives in independent living.  Patient noted to have converted to sinus rhythm this morning.  Objective: Vitals:   12/23/20 0544 12/23/20 1013 12/23/20 1406 12/23/20 1513  BP: 112/65  (!) 141/70   Pulse: 60 65 67   Resp: 18  18   Temp: 98.7 F (37.1 C)  98.2 F (36.8 C)   TempSrc: Oral  Oral   SpO2: 93%  97%   Weight:    51.7 kg  Height:    5\' 5"  (1.651 m)    Intake/Output Summary (Last 24 hours) at 12/23/2020 2032 Last data filed at 12/23/2020 1837 Gross per 24 hour  Intake 2127.2 ml  Output 550 ml  Net 1577.2 ml   Filed Weights   12/23/20 1513  Weight: 51.7 kg    Examination:  General exam: Appears calm and comfortable.  Dry mucous membranes. Respiratory system: Clear to auscultation anterior lung fields.  No wheezes, no crackles, no rhonchi.Marland Kitchen Respiratory effort normal. Cardiovascular system: S1 & S2 heard, RRR. No JVD, murmurs, rubs, gallops or clicks. No pedal edema. Gastrointestinal system: Abdomen is nondistended, soft and nontender. No organomegaly or masses felt. Normal bowel sounds heard. Central nervous system: Alert and oriented. No focal neurological deficits. Extremities: Symmetric 5 x 5 power. Skin: No rashes, lesions or ulcers Psychiatry: Judgement and insight appear normal. Mood & affect appropriate.     Data Reviewed: I have personally reviewed following labs and imaging studies  CBC: Recent Labs  Lab 12/20/20 1554  12/21/20 1055 12/23/20 0400  WBC 8.1 6.8 6.9  NEUTROABS 6.9  --   --   HGB 13.8 12.5 10.4*  HCT 43.5 38.2 32.4*  MCV 93.1 91.0 91.8  PLT 212 214 818    Basic Metabolic Panel: Recent Labs  Lab 12/20/20 1554 12/21/20 1055 12/22/20 0435 12/23/20 0400  NA 138 137 133* 131*  K 3.4* 3.4* 3.7 3.7  CL 101 100 99 102  CO2 25 26 27 24   GLUCOSE 143* 103* 95 124*  BUN 27* 27* 24* 21  CREATININE 1.22* 0.96 0.91 1.06*  CALCIUM 9.3 8.8* 8.3* 8.0*  MG  --  2.0  --   --   PHOS  --  2.9  --   --     GFR: Estimated Creatinine Clearance: 29.9 mL/min (A) (by C-G formula based on SCr of 1.06 mg/dL (H)).  Liver Function Tests: Recent Labs  Lab 12/20/20 1554 12/21/20 1055  AST 33 22  ALT 19 17  ALKPHOS 100 82  BILITOT 0.7 0.5  PROT 7.6 6.7  ALBUMIN 3.7 3.1*    CBG: No results for input(s): GLUCAP in the last 168 hours.   Recent Results (from the past 240 hour(s))  Resp Panel by RT-PCR (Flu A&B, Covid) Nasopharyngeal Swab     Status: None   Collection Time: 12/21/20 10:54 AM   Specimen: Nasopharyngeal Swab; Nasopharyngeal(NP) swabs in vial transport medium  Result Value Ref Range Status   SARS Coronavirus 2  by RT PCR NEGATIVE NEGATIVE Final    Comment: (NOTE) SARS-CoV-2 target nucleic acids are NOT DETECTED.  The SARS-CoV-2 RNA is generally detectable in upper respiratory specimens during the acute phase of infection. The lowest concentration of SARS-CoV-2 viral copies this assay can detect is 138 copies/mL. A negative result does not preclude SARS-Cov-2 infection and should not be used as the sole basis for treatment or other patient management decisions. A negative result may occur with  improper specimen collection/handling, submission of specimen other than nasopharyngeal swab, presence of viral mutation(s) within the areas targeted by this assay, and inadequate number of viral copies(<138 copies/mL). A negative result must be combined with clinical observations,  patient history, and epidemiological information. The expected result is Negative.  Fact Sheet for Patients:  EntrepreneurPulse.com.au  Fact Sheet for Healthcare Providers:  IncredibleEmployment.be  This test is no t yet approved or cleared by the [place] FDA and  has been authorized for detection and/or diagnosis of SARS-CoV-2 by FDA under an Emergency Use Authorization (EUA). This EUA will remain  in effect (meaning this test can be used) for the duration of the COVID-19 declaration under Section 564(b)(1) of the Act, 21 U.S.C.section 360bbb-3(b)(1), unless the authorization is terminated  or revoked sooner.       Influenza A by PCR NEGATIVE NEGATIVE Final   Influenza B by PCR NEGATIVE NEGATIVE Final    Comment: (NOTE) The Xpert Xpress SARS-CoV-2/FLU/RSV plus assay is intended as an aid in the diagnosis of influenza from Nasopharyngeal swab specimens and should not be used as a sole basis for treatment. Nasal washings and aspirates are unacceptable for Xpert Xpress SARS-CoV-2/FLU/RSV testing.  Fact Sheet for Patients: EntrepreneurPulse.com.au  Fact Sheet for Healthcare Providers: IncredibleEmployment.be  This test is not yet approved or cleared by the [place] FDA and has been authorized for detection and/or diagnosis of SARS-CoV-2 by FDA under an Emergency Use Authorization (EUA). This EUA will remain in effect (meaning this test can be used) for the duration of the COVID-19 declaration under Section 564(b)(1) of the Act, 21 U.S.C. section 360bbb-3(b)(1), unless the authorization is terminated or revoked.  Performed at [facility], [place] [address] [place], [place] [address]   Urine Culture     Status: Abnormal   Collection Time: 12/21/20 11:07 AM   Specimen: Urine, Clean Catch  Result Value Ref Range Status   Specimen Description   Final    URINE, CLEAN  CATCH Performed at [facility], [place] [address] [place], [place] [address]    Special Requests   Final    NONE Performed at [facility], [place] [address] [place], [place] [address]    Culture >=100,000 COLONIES/mL ESCHERICHIA COLI (A)  Final   Report Status 12/23/2020 FINAL  Final   Organism ID, Bacteria ESCHERICHIA COLI (A)  Final      Susceptibility   Escherichia coli - MIC*    AMPICILLIN >=32 RESISTANT Resistant     CEFAZOLIN <=4 SENSITIVE Sensitive     CEFEPIME <=0.12 SENSITIVE Sensitive     CEFTRIAXONE <=0.25 SENSITIVE Sensitive     CIPROFLOXACIN <=0.25 SENSITIVE Sensitive     GENTAMICIN <=1 SENSITIVE Sensitive     IMIPENEM <=0.25 SENSITIVE Sensitive     NITROFURANTOIN 32 SENSITIVE Sensitive     TRIMETH/SULFA >=320 RESISTANT Resistant     AMPICILLIN/SULBACTAM >=32 RESISTANT Resistant     PIP/TAZO <=4 SENSITIVE Sensitive     * >=100,000 COLONIES/mL ESCHERICHIA COLI  Gastrointestinal Panel  by PCR , Stool     Status: None   Collection Time: 12/21/20  4:19 PM   Specimen: Stool  Result Value Ref Range Status   Campylobacter species NOT DETECTED NOT DETECTED Final   Plesimonas shigelloides NOT DETECTED NOT DETECTED Final   Salmonella species NOT DETECTED NOT DETECTED Final   Yersinia enterocolitica NOT DETECTED NOT DETECTED Final   Vibrio species NOT DETECTED NOT DETECTED Final   Vibrio cholerae NOT DETECTED NOT DETECTED Final   Enteroaggregative E coli (EAEC) NOT DETECTED NOT DETECTED Final   Enteropathogenic E coli (EPEC) NOT DETECTED NOT DETECTED Final   Enterotoxigenic E coli (ETEC) NOT DETECTED NOT DETECTED Final   Shiga like toxin producing E coli (STEC) NOT DETECTED NOT DETECTED Final   Shigella/Enteroinvasive E coli (EIEC) NOT DETECTED NOT DETECTED Final   Cryptosporidium NOT DETECTED NOT DETECTED Final   Cyclospora cayetanensis NOT DETECTED NOT DETECTED Final   Entamoeba histolytica NOT DETECTED NOT DETECTED Final   Giardia  lamblia NOT DETECTED NOT DETECTED Final   Adenovirus F40/41 NOT DETECTED NOT DETECTED Final   Astrovirus NOT DETECTED NOT DETECTED Final   Norovirus GI/GII NOT DETECTED NOT DETECTED Final   Rotavirus A NOT DETECTED NOT DETECTED Final   Sapovirus (I, II, IV, and V) NOT DETECTED NOT DETECTED Final    Comment: Performed at [facility], [place] [place], [place] [address]  C Difficile Quick Screen w PCR reflex     Status: None   Collection Time: 12/21/20  4:19 PM   Specimen: Stool  Result Value Ref Range Status   C Diff antigen NEGATIVE NEGATIVE Final   C Diff toxin NEGATIVE NEGATIVE Final   C Diff interpretation No C. difficile detected.  Final    Comment: Performed at [facility], [place] [address], [place], [place] [address]         Radiology Studies: ECHOCARDIOGRAM COMPLETE  Result Date: 12/22/2020    ECHOCARDIOGRAM REPORT   Patient Name:   Jorgia ENAYA HOWZE Date of Exam: 12/22/2020 Medical Rec #:  [id number]   Height:       66.0 in Accession #:    [id number]  Weight:       113.0 lb Date of Birth:  Jul 11, 1930   BSA:          1.569 m Patient Age:    65 years    BP:           127/94 mmHg Patient Gender: F           HR:           106 bpm. Exam Location:  Outpatient Procedure: 2D Echo, Cardiac Doppler and Color Doppler Indications:    AFIB  History:        Patient has no prior history of Echocardiogram examinations.                 Risk Factors:Hypertension.  Sonographer:    Jyl Heinz Referring Phys: [id number] [place]  1. Moderate septal hypertrophy with otherwise mild concentric LVH. Left ventricular ejection fraction, by estimation, is 40 to 45%. The left ventricle has mildly decreased function. The left ventricle demonstrates global hypokinesis. There is moderate left ventricular hypertrophy. Left ventricular diastolic parameters are indeterminate. Elevated left ventricular end-diastolic pressure.  2. Right ventricular systolic function is  normal. The right ventricular size is normal. There is mildly elevated pulmonary artery systolic pressure.  3. Left atrial size was mildly dilated.  4. The  mitral valve is normal in structure. Mild mitral valve regurgitation. No evidence of mitral stenosis.  5. Tricuspid valve regurgitation is moderate.  6. The aortic valve is tricuspid. There is mild calcification of the aortic valve. There is mild thickening of the aortic valve. Aortic valve regurgitation is not visualized. No aortic stenosis is present.  7. The inferior vena cava is dilated in size with >50% respiratory variability, suggesting right atrial pressure of 8 mmHg. FINDINGS  Left Ventricle: Moderate septal hypertrophy with otherwise mild concentric LVH. Left ventricular ejection fraction, by estimation, is 40 to 45%. The left ventricle has mildly decreased function. The left ventricle demonstrates global hypokinesis. The left ventricular internal cavity size was normal in size. There is moderate left ventricular hypertrophy. Left ventricular diastolic parameters are indeterminate. Elevated left ventricular end-diastolic pressure. Right Ventricle: The right ventricular size is normal. No increase in right ventricular wall thickness. Right ventricular systolic function is normal. There is mildly elevated pulmonary artery systolic pressure. The tricuspid regurgitant velocity is 2.88  m/s, and with an assumed right atrial pressure of 8 mmHg, the estimated right ventricular systolic pressure is 76.7 mmHg. Left Atrium: Left atrial size was mildly dilated. Right Atrium: Right atrial size was normal in size. Pericardium: There is no evidence of pericardial effusion. Mitral Valve: The mitral valve is normal in structure. Mild mitral valve regurgitation. No evidence of mitral valve stenosis. Tricuspid Valve: The tricuspid valve is normal in structure. Tricuspid valve regurgitation is moderate . No evidence of tricuspid stenosis. Aortic Valve: The aortic valve is  tricuspid. There is mild calcification of the aortic valve. There is mild thickening of the aortic valve. Aortic valve regurgitation is not visualized. No aortic stenosis is present. Aortic valve peak gradient measures 6.0 mmHg. Pulmonic Valve: The pulmonic valve was normal in structure. Pulmonic valve regurgitation is trivial. No evidence of pulmonic stenosis. Aorta: The aortic root is normal in size and structure. Venous: The inferior vena cava is dilated in size with greater than 50% respiratory variability, suggesting right atrial pressure of 8 mmHg. IAS/Shunts: No atrial level shunt detected by color flow Doppler.  LEFT VENTRICLE PLAX 2D LVIDd:         3.40 cm     Diastology LVIDs:         3.00 cm     LV e' medial:    7.51 cm/s LV PW:         1.10 cm     LV E/e' medial:  16.2 LV IVS:        1.50 cm     LV e' lateral:   8.27 cm/s LVOT diam:     2.00 cm     LV E/e' lateral: 14.8 LV SV:         45 LV SV Index:   29 LVOT Area:     3.14 cm  LV Volumes (MOD) LV vol d, MOD A2C: 58.3 ml LV vol d, MOD A4C: 73.1 ml LV vol s, MOD A2C: 34.8 ml LV vol s, MOD A4C: 42.4 ml LV SV MOD A2C:     23.5 ml LV SV MOD A4C:     73.1 ml LV SV MOD BP:      27.9 ml RIGHT VENTRICLE             IVC RV Basal diam:  4.20 cm     IVC diam: 2.30 cm RV S prime:     12.50 cm/s TAPSE (M-mode): 2.0 cm LEFT ATRIUM  Index        RIGHT ATRIUM           Index LA diam:        3.50 cm 2.23 cm/m   RA Area:     16.10 cm LA Vol (A2C):   48.2 ml 30.72 ml/m  RA Volume:   42.70 ml  27.22 ml/m LA Vol (A4C):   39.0 ml 24.86 ml/m LA Biplane Vol: 44.5 ml 28.36 ml/m  AORTIC VALVE AV Area (Vmax): 2.07 cm AV Vmax:        122.00 cm/s AV Peak Grad:   6.0 mmHg LVOT Vmax:      80.20 cm/s LVOT Vmean:     56.450 cm/s LVOT VTI:       0.143 m  AORTA Ao Root diam: 3.40 cm Ao Asc diam:  3.40 cm MITRAL VALVE                TRICUSPID VALVE MV Area (PHT): 5.27 cm     TR Peak grad:   33.2 mmHg MV Decel Time: 144 msec     TR Vmax:        288.00 cm/s MR Peak grad:  101.6 mmHg MR Mean grad: 70.0 mmHg     SHUNTS MR Vmax:      504.00 cm/s   Systemic VTI:  0.14 m MR Vmean:     403.5 cm/s    Systemic Diam: 2.00 cm MV E velocity: 122.00 cm/s MV A velocity: 85.40 cm/s MV E/A ratio:  1.43 Skeet Latch MD Electronically signed by Skeet Latch MD Signature Date/Time: 12/22/2020/6:06:35 PM    Final         Scheduled Meds:  (feeding supplement) PROSource Plus  30 mL Oral BID BM   apixaban  2.5 mg Oral BID   carvedilol  6.25 mg Oral BID   [START ON 12/24/2020] cefadroxil  500 mg Oral BID   feeding supplement  1 Container Oral BID BM   losartan  12.5 mg Oral Daily   multivitamin with minerals  1 tablet Oral Daily   Opium  0.6 mL Oral TID AC & HS   Continuous Infusions:  lactated ringers Stopped (12/21/20 1626)     LOS: 1 day    Time spent: 35 minutes    Irine Seal, MD [facility]   To contact the attending provider between 7A-7P or the covering provider during after hours 7P-7A, please log into the web site [email or web address] and access using universal [place] password for that web site. If you do not have the password, please call the hospital operator.  12/23/2020, 8:32 PM

## 2020-12-23 NOTE — Plan of Care (Signed)

## 2020-12-23 NOTE — Progress Notes (Addendum)
Progress Note  Patient Name: [name] Date of Encounter: 12/23/2020  [facility] Cardiologist: Sanda Klein, MD   Subjective   Much better spirits this morning, working with OT. No complaints. Unaware of conversion to sinus.  Inpatient Medications    Scheduled Meds:  carvedilol  6.25 mg Oral BID   enoxaparin (LOVENOX) injection  30 mg Subcutaneous Q24H   Continuous Infusions:  cefTRIAXone (ROCEPHIN)  IV 1 g (12/22/20 1134)   lactated ringers Stopped (12/21/20 1626)   PRN Meds: acetaminophen **OR** acetaminophen, ondansetron **OR** ondansetron (ZOFRAN) IV, Opium   Vital Signs    Vitals:   12/22/20 2000 12/22/20 2121 12/23/20 0143 12/23/20 0544  BP:  (!) 154/91 (!) 98/57 112/65  Pulse:  84 (!) 55 60  Resp:  18 18 18   Temp:  98.9 F (37.2 C) 98.1 F (36.7 C) 98.7 F (37.1 C)  TempSrc:  Oral Oral Oral  SpO2: 95% 95% 94% 93%    Intake/Output Summary (Last 24 hours) at 12/23/2020 0945 Last data filed at 12/23/2020 0600 Gross per 24 hour  Intake 2812.06 ml  Output 550 ml  Net 2262.06 ml   Last 3 Weights 01/17/2019 12/22/2018 12/22/2018  Weight (lbs) 113 lb 129 lb 13.6 oz 130 lb  Weight (kg) 51.256 kg 58.9 kg 58.968 kg      Telemetry    Fib/flutter with RVR --> slow flutter --> now sinus rhythm in the 60-70s - Personally Reviewed  ECG    No new tracings - Personally Reviewed  Physical Exam   GEN: No acute distress.   Neck: No JVD Cardiac: RRR, no murmurs, rubs, or gallops.  Respiratory: respirations unlabored, crackles in right base GI: Soft, nontender, non-distended  MS: No edema; No deformity. Neuro:  Nonfocal  Psych: Normal affect   Labs    High Sensitivity Troponin:  No results for input(s): TROPONINIHS in the last 720 hours.   Chemistry Recent Labs  Lab 12/20/20 1554 12/21/20 1055 12/22/20 0435 12/23/20 0400  NA 138 137 133* 131*  K 3.4* 3.4* 3.7 3.7  CL 101 100 99 102  CO2 25 26 27 24   GLUCOSE 143* 103* 95 124*  BUN 27*  27* 24* 21  CREATININE 1.22* 0.96 0.91 1.06*  CALCIUM 9.3 8.8* 8.3* 8.0*  MG  --  2.0  --   --   PROT 7.6 6.7  --   --   ALBUMIN 3.7 3.1*  --   --   AST 33 22  --   --   ALT 19 17  --   --   ALKPHOS 100 82  --   --   BILITOT 0.7 0.5  --   --   GFRNONAA 43* 57* >60 51*  ANIONGAP 12 11 7 5     Lipids No results for input(s): CHOL, TRIG, HDL, LABVLDL, LDLCALC, CHOLHDL in the last 168 hours.  Hematology Recent Labs  Lab 12/20/20 1554 12/21/20 1055 12/23/20 0400  WBC 8.1 6.8 6.9  RBC 4.67 4.20 3.53*  HGB 13.8 12.5 10.4*  HCT 43.5 38.2 32.4*  MCV 93.1 91.0 91.8  MCH 29.6 29.8 29.5  MCHC 31.7 32.7 32.1  RDW 13.6 13.5 13.6  PLT 212 214 188   Thyroid  Recent Labs  Lab 12/22/20 1330  TSH 1.489    BNPNo results for input(s): BNP, PROBNP in the last 168 hours.  DDimer No results for input(s): DDIMER in the last 168 hours.   Radiology    ECHOCARDIOGRAM COMPLETE  Result  Date: 12/22/2020    ECHOCARDIOGRAM REPORT   Patient Name:   Berenize CARLISA EBLE Date of Exam: 12/22/2020 Medical Rec #:  [id number]   Height:       66.0 in Accession #:    [id number]  Weight:       113.0 lb Date of Birth:  1931/01/27   BSA:          1.569 m Patient Age:    10 years    BP:           127/94 mmHg Patient Gender: F           HR:           106 bpm. Exam Location:  Outpatient Procedure: 2D Echo, Cardiac Doppler and Color Doppler Indications:    AFIB  History:        Patient has no prior history of Echocardiogram examinations.                 Risk Factors:Hypertension.  Sonographer:    Jyl Heinz Referring Phys: [id number] [place]  1. Moderate septal hypertrophy with otherwise mild concentric LVH. Left ventricular ejection fraction, by estimation, is 40 to 45%. The left ventricle has mildly decreased function. The left ventricle demonstrates global hypokinesis. There is moderate left ventricular hypertrophy. Left ventricular diastolic parameters are indeterminate. Elevated left ventricular  end-diastolic pressure.  2. Right ventricular systolic function is normal. The right ventricular size is normal. There is mildly elevated pulmonary artery systolic pressure.  3. Left atrial size was mildly dilated.  4. The mitral valve is normal in structure. Mild mitral valve regurgitation. No evidence of mitral stenosis.  5. Tricuspid valve regurgitation is moderate.  6. The aortic valve is tricuspid. There is mild calcification of the aortic valve. There is mild thickening of the aortic valve. Aortic valve regurgitation is not visualized. No aortic stenosis is present.  7. The inferior vena cava is dilated in size with >50% respiratory variability, suggesting right atrial pressure of 8 mmHg. FINDINGS  Left Ventricle: Moderate septal hypertrophy with otherwise mild concentric LVH. Left ventricular ejection fraction, by estimation, is 40 to 45%. The left ventricle has mildly decreased function. The left ventricle demonstrates global hypokinesis. The left ventricular internal cavity size was normal in size. There is moderate left ventricular hypertrophy. Left ventricular diastolic parameters are indeterminate. Elevated left ventricular end-diastolic pressure. Right Ventricle: The right ventricular size is normal. No increase in right ventricular wall thickness. Right ventricular systolic function is normal. There is mildly elevated pulmonary artery systolic pressure. The tricuspid regurgitant velocity is 2.88  m/s, and with an assumed right atrial pressure of 8 mmHg, the estimated right ventricular systolic pressure is 14.4 mmHg. Left Atrium: Left atrial size was mildly dilated. Right Atrium: Right atrial size was normal in size. Pericardium: There is no evidence of pericardial effusion. Mitral Valve: The mitral valve is normal in structure. Mild mitral valve regurgitation. No evidence of mitral valve stenosis. Tricuspid Valve: The tricuspid valve is normal in structure. Tricuspid valve regurgitation is moderate .  No evidence of tricuspid stenosis. Aortic Valve: The aortic valve is tricuspid. There is mild calcification of the aortic valve. There is mild thickening of the aortic valve. Aortic valve regurgitation is not visualized. No aortic stenosis is present. Aortic valve peak gradient measures 6.0 mmHg. Pulmonic Valve: The pulmonic valve was normal in structure. Pulmonic valve regurgitation is trivial. No evidence of pulmonic stenosis. Aorta: The aortic root is normal in size and structure.  Venous: The inferior vena cava is dilated in size with greater than 50% respiratory variability, suggesting right atrial pressure of 8 mmHg. IAS/Shunts: No atrial level shunt detected by color flow Doppler.  LEFT VENTRICLE PLAX 2D LVIDd:         3.40 cm     Diastology LVIDs:         3.00 cm     LV e' medial:    7.51 cm/s LV PW:         1.10 cm     LV E/e' medial:  16.2 LV IVS:        1.50 cm     LV e' lateral:   8.27 cm/s LVOT diam:     2.00 cm     LV E/e' lateral: 14.8 LV SV:         45 LV SV Index:   29 LVOT Area:     3.14 cm  LV Volumes (MOD) LV vol d, MOD A2C: 58.3 ml LV vol d, MOD A4C: 73.1 ml LV vol s, MOD A2C: 34.8 ml LV vol s, MOD A4C: 42.4 ml LV SV MOD A2C:     23.5 ml LV SV MOD A4C:     73.1 ml LV SV MOD BP:      27.9 ml RIGHT VENTRICLE             IVC RV Basal diam:  4.20 cm     IVC diam: 2.30 cm RV S prime:     12.50 cm/s TAPSE (M-mode): 2.0 cm LEFT ATRIUM             Index        RIGHT ATRIUM           Index LA diam:        3.50 cm 2.23 cm/m   RA Area:     16.10 cm LA Vol (A2C):   48.2 ml 30.72 ml/m  RA Volume:   42.70 ml  27.22 ml/m LA Vol (A4C):   39.0 ml 24.86 ml/m LA Biplane Vol: 44.5 ml 28.36 ml/m  AORTIC VALVE AV Area (Vmax): 2.07 cm AV Vmax:        122.00 cm/s AV Peak Grad:   6.0 mmHg LVOT Vmax:      80.20 cm/s LVOT Vmean:     56.450 cm/s LVOT VTI:       0.143 m  AORTA Ao Root diam: 3.40 cm Ao Asc diam:  3.40 cm MITRAL VALVE                TRICUSPID VALVE MV Area (PHT): 5.27 cm     TR Peak grad:   33.2 mmHg  MV Decel Time: 144 msec     TR Vmax:        288.00 cm/s MR Peak grad: 101.6 mmHg MR Mean grad: 70.0 mmHg     SHUNTS MR Vmax:      504.00 cm/s   Systemic VTI:  0.14 m MR Vmean:     403.5 cm/s    Systemic Diam: 2.00 cm MV E velocity: 122.00 cm/s MV A velocity: 85.40 cm/s MV E/A ratio:  1.43 Skeet Latch MD Electronically signed by Skeet Latch MD Signature Date/Time: 12/22/2020/6:06:35 PM    Final     Cardiac Studies   Echo 12/22/20: 1. Moderate septal hypertrophy with otherwise mild concentric LVH. Left  ventricular ejection fraction, by estimation, is 40 to 45%. The left  ventricle has mildly decreased function. The left ventricle demonstrates  global hypokinesis. There is moderate  left ventricular hypertrophy. Left ventricular diastolic parameters are  indeterminate. Elevated left ventricular end-diastolic pressure.   2. Right ventricular systolic function is normal. The right ventricular  size is normal. There is mildly elevated pulmonary artery systolic  pressure.   3. Left atrial size was mildly dilated.   4. The mitral valve is normal in structure. Mild mitral valve  regurgitation. No evidence of mitral stenosis.   5. Tricuspid valve regurgitation is moderate.   6. The aortic valve is tricuspid. There is mild calcification of the  aortic valve. There is mild thickening of the aortic valve. Aortic valve  regurgitation is not visualized. No aortic stenosis is present.   7. The inferior vena cava is dilated in size with >50% respiratory  variability, suggesting right atrial pressure of 8 mmHg.   Patient Profile     87 y.o. female with a hx of COPD, HTN, OA, hx of melanoma, GERD, depression, and peripheral neuropathy who is being seen 12/22/2020 for the evaluation of new onset AFib .  Assessment & Plan    Atrial fibrillation / flutter RVR - cardizem gtt started with resolution of RVR to slow flutter and eventual conversion to sinus rhythm in the 60s - amlodipine was D/C'ed,  but coreg remains at home dose of 6.25 mg BID - cardizem gtt discontinued this morning due to sinus rhythm - consider increasing coreg to 12.5 mg BID - TSH WNL   Need for chronic anticoagulation This patients CHA2DS2-VASc Score and unadjusted Ischemic Stroke Rate (% per year) is equal to 4.8 % stroke rate/year from a score of 4 (HTN, female, 2age) - fortunately, she has converted - recommend 30 day heart monitor to evaluate recurrence of Afib/flutter - given her age and chronic diarrhea, she has a high likelihood of returning to fib/flutter - recommend 2.5 mg eliquis BID, but will need clearance from GI - she is frail, will also need to monitor anemia and falls   Newly recognized systolic heart failure - LVEF 40-45% with global hypokinesis, LVH, elevated LVEDP, mild MR, moderate TR - crackles in right base - does not appear extremely volume up, although volume status is going to be difficult given her chronic intermittent diarrhea  - possible eitology could be tachy-mediated, unclear [name] long she was in RVR in the absence of symptoms - BP much lower today, likely from IV cardizem that is now off - if BP increases, consider adding ARB - pressures were in the 180s in the ER but somewhat labile   Hypertension - D/C'ed amlodipine - monitor pressure off of cardizem - add ARB if better control is needed   Chronic diarrhea Weight loss Hx of colectomy for bleeding, per patient - opium tincture has been restarted - she is very sensitive to effects in the absence of tincture - recommend avoiding interruption in therapy   I will arrange follow up with cardiology.     For questions or updates, please contact [place] Please consult [email or web address] for contact info under        Signed, Ledora Bottcher, PA  12/23/2020, 9:45 AM    I have seen and examined the patient along with Ledora Bottcher, PA.  I have reviewed the chart, notes and new data.  I agree with PA/NP's  note.  Key new complaints: asymptomatic from arrhythmia point of view - never felt the transition back from AFib. Was very well rate controlled at time of conversion to NSR. Key examination  changes: no clinical signs of hypervolemia/CHF Key new findings / data: NSR 60s and SBP 100-110 on carvedilol 6.25 mg BID. Echo shows mildly reduced LVEF (global hypokinesis).  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 No overt CHF. Added low dose losartan. Repeat echo in 2-3 months. Need a way to monitor for arrhythmia recurrence and make sure rate control is adequate, since she is unaware of palpitations. She does not use electronic devices (has an iPad she has not used in years) and is unlikely to be able to manage a wearable device like a smartwatch. Recommended that she consider an implantable loop recorder, that can be done at office follow-up. CHADSVasc is 43 (age 22, gender, HTN and decreased LVEF). Embolic risk is increased, but so is her bleeding risk (elderly, thin and frail, recent falls, history of GI bleeding). Had a long discussion re: benefits and risks of anticoagulation. She reports that the falls this weekend are the only ones she has had in years. No GI bleeding since her colectomy. Will start Eliquis (low dose for age and small body size).  [facility] will sign off.   Medication Recommendations:  carvedilol 6.25 mg twice daily, losartan 12.5 mg daily, Eliquis 2.5 mg twice daily Other recommendations (labs, testing, etc): limited echo in 2-3 months Follow up as an outpatient:  AFib clinic in 2 weeks, loop recorder in office (if she agrees to it after consultation w family)   Sanda Klein, MD, [facility] [phone] 12/23/2020, 12:32 PM

## 2020-12-23 NOTE — Evaluation (Signed)
Physical Therapy Evaluation Patient Details Name: [name] MRN: [id number] DOB: 1930-07-14 Today's Date: 12/23/2020  History of Present Illness  87 year old Caucasian female from [place] facility with a past medical history significant for but not limited to COVID-19, anemia, arthritis, chronic diarrhea from her prior Colon Surgery, COPD, GERD, hypertension and other comorbidities who presented to the hospital with diarrhea x 3 days, negative for C-Diff, found with UTI, tachycardia and new onset A-Fib.  Clinical Impression  Pt reports living at [place] and states she is frustrated upon arriving to room.  Pt attempting to call her son in [place] from room phone.  Explained room phone can only make local calls.  RN into room later and reminded pt they had discussed her friend bringing in her cell phone.  Pt requires increased time for mobilizing and performing all activities.  Pt also requiring cues for safety as well as during pericare (wiping front to back).  Pt reports wearing depends and 2 pads at baseline.  Pt may benefit from SNF upon d/c if ILF is not able to provide min assist.     Recommendations for follow up therapy are one component of a multi-disciplinary discharge planning process, led by the attending physician.  Recommendations may be updated based on patient status, additional functional criteria and insurance authorization.  Follow Up Recommendations Skilled nursing-short term rehab (<3 hours/day)    Assistance Recommended at Discharge Frequent or constant Supervision/Assistance  Functional Status Assessment Patient has had a recent decline in their functional status and demonstrates the ability to make significant improvements in function in a reasonable and predictable amount of time.  Equipment Recommendations  None recommended by PT    Recommendations for Other Services       Precautions / Restrictions Precautions Precautions: Fall Precaution Comments:  incontinent of both urine and bowels      Mobility  Bed Mobility Overal bed mobility: Needs Assistance Bed Mobility: Supine to Sit     Supine to sit: Supervision;HOB elevated     General bed mobility comments: increased time    Transfers Overall transfer level: Needs assistance Equipment used: Rolling walker (2 wheels) Transfers: Sit to/from Stand Sit to Stand: Min assist           General transfer comment: assist for rise, cues for hand placement    Ambulation/Gait Ambulation/Gait assistance: Min guard Gait Distance (Feet): 10 Feet Assistive device: Rolling walker (2 wheels) Gait Pattern/deviations: Step-through pattern;Decreased stride length     General Gait Details: started ambulating in room and pt had urinary incontinence then felt she needed to have BM (also started to have BM) so ambulated to bathroom, pt then ambulated to recliner in room  Stairs            Wheelchair Mobility    Modified Rankin (Stroke Patients Only)       Balance Overall balance assessment: History of Falls;Needs assistance         Standing balance support: Bilateral upper extremity supported;Reliant on assistive device for balance Standing balance-Leahy Scale: Poor                               Pertinent Vitals/Pain Pain Assessment: No/denies pain    Home Living Family/patient expects to be discharged to:: Private residence Living Arrangements: Alone   Type of Home: Independent living facility           Home Equipment: Conservation officer, nature (2  wheels) Additional Comments: Pt lives at [facility] on the Independent Living wing.  Has walk in shower and built in bench.    Prior Function Prior Level of Function : Needs assist;History of Falls (last six months);Driving             Mobility Comments: Pt uses [employer] 2 wheeled RW. ADLs Comments: Pt has housekeeping and meals provided through ILF. Pt does her own laundry. Laundry serivce is  available for a fee. Pt reports Independence with BADLs, does not wear socks. Uses reacher and long bath brush as needed.     Hand Dominance        Extremity/Trunk Assessment        Lower Extremity Assessment Lower Extremity Assessment: RLE deficits/detail;LLE deficits/detail;Generalized weakness RLE Sensation: history of peripheral neuropathy LLE Sensation: history of peripheral neuropathy       Communication   Communication: Expressive difficulties  Cognition Arousal/Alertness: Awake/alert Behavior During Therapy: WFL for tasks assessed/performed;Flat affect Overall Cognitive Status: No family/caregiver present to determine baseline cognitive functioning                                 General Comments: slow speech and difficulty portraying what she is thinking, requires increased time for activities and also perseverates on task at hand        General Comments      Exercises     Assessment/Plan    PT Assessment Patient needs continued PT services  PT Problem List Decreased knowledge of use of DME;Decreased activity tolerance;Decreased strength;Decreased mobility       PT Treatment Interventions DME instruction;Therapeutic exercise;Functional mobility training;Therapeutic activities;Patient/family education;Balance training;Gait training    PT Goals (Current goals can be found in the Care Plan section)  Acute Rehab PT Goals PT Goal Formulation: With patient Time For Goal Achievement: 01/06/21 Potential to Achieve Goals: Good    Frequency Min 3X/week   Barriers to discharge        Co-evaluation               AM-PAC PT "6 Clicks" Mobility  Outcome Measure Help needed turning from your back to your side while in a flat bed without using bedrails?: A Little Help needed moving from lying on your back to sitting on the side of a flat bed without using bedrails?: A Little Help needed moving to and from a bed to a chair (including a  wheelchair)?: A Little Help needed standing up from a chair using your arms (e.g., wheelchair or bedside chair)?: A Little Help needed to walk in hospital room?: A Little Help needed climbing 3-5 steps with a railing? : A Little 6 Click Score: 18    End of Session Equipment Utilized During Treatment: Gait belt Activity Tolerance: Patient tolerated treatment well Patient left: in chair;with call bell/phone within reach;with chair alarm set;with nursing/sitter in room Nurse Communication: Mobility status PT Visit Diagnosis: Difficulty in walking, not elsewhere classified (R26.2)    Time: 2703-5009 PT Time Calculation (min) (ACUTE ONLY): 33 min   Charges:   PT Evaluation $PT Eval Low Complexity: 1 Low PT Treatments $Therapeutic Activity: 8-22 mins   Jannette Spanner PT, DPT Acute Rehabilitation Services Pager: [phone] Office: [place] 12/23/2020, 3:33 PM

## 2020-12-23 NOTE — Evaluation (Signed)
Occupational Therapy Evaluation Patient Details Name: [name] MRN: [id number] DOB: 1930-03-27 Today's Date: 12/23/2020   History of Present Illness 87 year old Caucasian female from [place] facility with a past medical history significant for but not limited to COVID-19, anemia, arthritis, chronic diarrhea from her prior Colon Surgery, COPD, GERD, hypertension and other comorbidities who presented to the hospital with diarrhea x 3 days, negative for C-Diff, found with UTI, tachycardia and new onset A-Fib.   Clinical Impression   Patient is currently requiring assistance with ADLs including moderate to maximum assist with toileting, moderate to minimal assist with LE dressing, moderate assist with bathing, and min guard to setup  assist with feeding and grooming, all of which is below patient's reported typical baseline of being Modified independent.  During this evaluation, patient was limited by brady-speech and bradykinesia with cognitive and memory deficits, delayed processing, and perseveration of activities such as 20 min brushing teeth, generalized weakness, and impaired activity tolerance which has the potential to impact patient's safety and independence during functional mobility, as well as performance for ADLs.  Patient lives at an [facility] with PRN assistance and meal/housekeeping provided.  Patient demonstrates good rehab potential, and should benefit from continued skilled occupational therapy services while in acute care to maximize safety, independence and quality of life at home.  Continued occupational therapy services in a SNF setting prior to return home is recommended.  ?     Recommendations for follow up therapy are one component of a multi-disciplinary discharge planning process, led by the attending physician.  Recommendations may be updated based on patient status, additional functional criteria and insurance authorization.   Follow Up Recommendations  Skilled  nursing-short term rehab (<3 hours/day)    Assistance Recommended at Discharge Frequent or constant Supervision/Assistance  Functional Status Assessment  Patient has had a recent decline in their functional status and demonstrates the ability to make significant improvements in function in a reasonable and predictable amount of time.  Equipment Recommendations  None recommended by OT    Recommendations for Other Services       Precautions / Restrictions Precautions Precautions: Fall Restrictions Weight Bearing Restrictions: No      Mobility Bed Mobility               General bed mobility comments: Pt up in recliner    Transfers Overall transfer level: Needs assistance Equipment used: Rolling walker (2 wheels) Transfers: Sit to/from Stand Sit to Stand: Mod assist           General transfer comment: Moderate assist to stand from recliner and from toilet.      Balance Overall balance assessment: History of Falls;Needs assistance Sitting-balance support: Single extremity supported;Feet supported Sitting balance-Leahy Scale: Fair     Standing balance support: Bilateral upper extremity supported Standing balance-Leahy Scale: Poor Standing balance comment: Required BUE support with dynamic and unilateral UE support with static.                           ADL either performed or assessed with clinical judgement   ADL Overall ADL's : Needs assistance/impaired (All ADLs required increased time due to brady-kinesia and delayed processing.) Eating/Feeding: Set up;Sitting Eating/Feeding Details (indicate cue type and reason): Needs assist with opening containers. Grooming: Oral care;Minimal assistance;Standing Grooming Details (indicate cue type and reason): Increased time ~20 min to brush teeth with need of assist with handling faucets, opening toothpsate and applying paste to brush. Min  guard for static standing at sink with one arm suport. Upper Body  Bathing: Minimal assistance;Sitting   Lower Body Bathing: Maximal assistance;Sitting/lateral leans Lower Body Bathing Details (indicate cue type and reason): Pt required assistance for sponge bathing lower body after incontinence of urine x 2 while ambulating to bathroom. Pt sat on commode for max assist hygiene Upper Body Dressing : Minimal assistance;Sitting   Lower Body Dressing: Set up;Min guard;Sitting/lateral leans Lower Body Dressing Details (indicate cue type and reason): Pt never wears socks. Uses easy to slip on shoes. Pt able to simulate donning underwear with pillow case over feet in chair with increased time/effort and Min guard. Toilet Transfer: Regular Toilet;Grab bars;Ambulation;Rolling walker (2 wheels);Moderate assistance Toilet Transfer Details (indicate cue type and reason): Min Assist and cues to descend to toilet with use of grab bar. Moderate assist to stand from toilet with cues for hand placement. Increased time/effort. Toileting- Clothing Manipulation and Hygiene: Moderate assistance;Sitting/lateral lean Toileting - Clothing Manipulation Details (indicate cue type and reason): Pt on pure wick. reported daytime continence but unabel to control bladder x 2 while ambulating. After using toilet pt required Moderate assist for hygiene.     Functional mobility during ADLs: Minimal assistance;Moderate assistance;Rolling walker (2 wheels)       Vision Baseline Vision/History: 1 Wears glasses Ability to See in Adequate Light: 0 Adequate Patient Visual Report: No change from baseline Additional Comments: Progressives.     Perception     Praxis Praxis Praxis: Impaired Praxis Impairment Details: Initiation;Perseveration;Motor planning    Pertinent Vitals/Pain Pain Assessment: No/denies pain Breathing: normal Negative Vocalization: none Facial Expression: smiling or inexpressive Body Language: relaxed Consolability: no need to console PAINAD Score: 0     Hand  Dominance Right   Extremity/Trunk Assessment Upper Extremity Assessment Upper Extremity Assessment: RUE deficits/detail;LUE deficits/detail RUE Deficits / Details: Shoulder without AROM and pt reports PROM is painful so avoids. elbow WFL grip with apparent OA and RA deformities (pt reports both) RUE Coordination: decreased fine motor LUE Deficits / Details: Shoulder AROM to ~115, elbow: WFL, grip with OA and RA looking deformities. LUE Coordination: decreased fine motor   Lower Extremity Assessment Lower Extremity Assessment: Defer to PT evaluation (h/o neuropathy to feet with decreased sensation.)       Communication Communication Communication: Other (comment);Expressive difficulties   Cognition Arousal/Alertness: Awake/alert Behavior During Therapy: WFL for tasks assessed/performed;Flat affect Overall Cognitive Status: No family/caregiver present to determine baseline cognitive functioning                                 General Comments: See Communication section as well. Pt with brady-kinesia, brady-speech, requires cues to follow through with tasks, becomes distracted.     General Comments       Exercises     Shoulder Instructions      Home Living Family/patient expects to be discharged to:: Other (Comment)                                 Additional Comments: Pt lives at [facility] on the Independent Living wing.  Has walk in shower and built in bench.      Prior Functioning/Environment Prior Level of Function : Needs assist;History of Falls (last six months);Driving             Mobility Comments: Pt uses [employer] 2 wheeled RW. Pt  reports that "it has a bucket. I call them gift items. It's Bella."  Unable to understand pt's description. ADLs Comments: Pt has housekeeping and meals provided through ILF. Pt does her own laundry. Laundry serivce is available for a fee. Pt reports Independence with BADLs, does not wear socks.  Uses reacher and long bath brush as needed.        OT Problem List: Decreased strength;Decreased coordination;Cardiopulmonary status limiting activity;Decreased range of motion;Impaired sensation;Decreased cognition;Decreased activity tolerance;Decreased safety awareness;Impaired balance (sitting and/or standing);Decreased knowledge of use of DME or AE;Impaired UE functional use      OT Treatment/Interventions:      OT Goals(Current goals can be found in the care plan section) Acute Rehab OT Goals Patient Stated Goal: To stop falling OT Goal Formulation: With patient Time For Goal Achievement: 01/06/21 Potential to Achieve Goals: Good ADL Goals Pt Will Perform Grooming: standing;with modified independence Pt Will Perform Lower Body Bathing: with adaptive equipment;with modified independence;sitting/lateral leans;sit to/from stand Pt Will Perform Lower Body Dressing: with modified independence;with adaptive equipment;sitting/lateral leans;sit to/from stand Pt Will Transfer to Toilet: with modified independence;ambulating;grab bars;regular height toilet Pt Will Perform Toileting - Clothing Manipulation and hygiene: with modified independence;with adaptive equipment;sitting/lateral leans Additional ADL Goal #1: Pt will engage in at least 5 min standing functional activities without loss of balance, in order to demonstrate improved activity tolerance and balance needed to perform ADLs safely at home.  OT Frequency:     Barriers to D/C:            Co-evaluation              AM-PAC OT "6 Clicks" Daily Activity     Outcome Measure Help from another person eating meals?: A Little Help from another person taking care of personal grooming?: A Little Help from another person toileting, which includes using toliet, bedpan, or urinal?: A Lot Help from another person bathing (including washing, rinsing, drying)?: A Lot Help from another person to put on and taking off regular upper body  clothing?: A Little Help from another person to put on and taking off regular lower body clothing?: A Little 6 Click Score: 16   End of Session Equipment Utilized During Treatment: Gait belt;Rolling walker (2 wheels) Nurse Communication: Mobility status (need of pure wick application.)  Activity Tolerance: Patient tolerated treatment well Patient left: in chair;with call bell/phone within reach;with chair alarm set;with nursing/sitter in room  OT Visit Diagnosis: Other abnormalities of gait and mobility (R26.89);Unsteadiness on feet (R26.81);History of falling (Z91.81);Muscle weakness (generalized) (M62.81);Cognitive communication deficit (R41.841)                Time: 8527-7824 OT Time Calculation (min): 60 min Charges:  OT General Charges $OT Visit: 1 Visit OT Evaluation $OT Eval Moderate Complexity: 1 Mod OT Treatments $Self Care/Home Management : 23-37 mins $Therapeutic Activity: 8-22 mins  Anderson Malta, OT Acute Rehab Services Office: [phone] 12/23/2020  Julien Girt 12/23/2020, 10:27 AM

## 2020-12-23 NOTE — Progress Notes (Signed)
Enteric precautions discontinued per protocol, C diff negative, GI panel negative.

## 2020-12-23 NOTE — Progress Notes (Signed)
Initial Nutrition Assessment  DOCUMENTATION CODES:   Severe malnutrition in context of chronic illness  INTERVENTION:  - will order Boost Breeze BID, each supplement provides 250 kcal and 9 grams of protein. - will order 30 ml Prosource Plus BID, each supplement provides 100 kcal and 15 grams protein.  - will order 1 tablet multivitamin with minerals/day. - weigh and measure patient today to determine weight and height.    NUTRITION DIAGNOSIS:   Severe Malnutrition related to chronic illness as evidenced by severe fat depletion, severe muscle depletion.  GOAL:   Patient will meet greater than or equal to 90% of their needs  MONITOR:   PO intake, Supplement acceptance, Labs, Weight trends  REASON FOR ASSESSMENT:   Consult Assessment of nutrition requirement/status  ASSESSMENT:   87 y.o. female with medical history of anemia, osteoarthritis, cancer, chronic diarrhea, COPD, depression, GERD, goiter, headaches, HTN, unspecified peripheral neuropathy, and osteoporosis. She presented to the ED from ALF at [facility] due to generalized weakness and diarrhea x3 days.  Patient sitting up in bed with no visitors at the time of RD visit. Patient is difficult to re-direct at times often getting off on tangents. She talks in a slow manner and sometimes forgets what the initial question was that she was trying to respond to.   She utilizes the cafeterias at [facility] [employer] and likes one better than the other. She often skips breakfast, lunch is left overs from the night before, and dinner she enjoys eating in the cafeteria with friends.   She reports that she often weighs herself after dinner and that she owns a scale. She states that a few weeks ago her weight was 107 lb and that she has been losing weight since moving to [facility] [employer] after a fall ~2 years ago.   Notes indicate that patient has chronic diarrhea. She shares that she needs an opium tincture prior to eating to avoid  diarrhea with meals.   No weight recording in the chart since 01/17/19.   Cardiology saw patient yesterday and today and has now signed off.    Labs reviewed; Na: 131 mmol/l, creatinine: 1.06 mg/dl, Ca: 8 mg/dl, GFR: 51 ml/min.  Medications reviewed. IVF; LR @ 100 ml/hr.     NUTRITION - FOCUSED PHYSICAL EXAM:  Flowsheet Row Most Recent Value  Orbital Region Severe depletion  Upper Arm Region Severe depletion  Thoracic and Lumbar Region Unable to assess  Buccal Region Severe depletion  Temple Region Severe depletion  Clavicle Bone Region Severe depletion  Clavicle and Acromion Bone Region Severe depletion  Scapular Bone Region Unable to assess  Dorsal Hand Severe depletion  Patellar Region Unable to assess  Anterior Thigh Region Unable to assess  Posterior Calf Region Unable to assess  Edema (RD Assessment) None  Hair Reviewed  Eyes Reviewed  Mouth Reviewed  Skin Reviewed  Nails Reviewed       Diet Order:   Diet Order             Diet Heart Room service appropriate? Yes; Fluid consistency: Thin  Diet effective now                   EDUCATION NEEDS:   Not appropriate for education at this time  Skin:  Skin Assessment: Reviewed RN Assessment  Last BM:  10/26  Height:   Ht Readings from Last 1 Encounters:  01/17/19 5\' 6"  (1.676 m)    Weight:   Wt Readings from Last 1 Encounters:  01/17/19 51.3 kg    Estimated Nutritional Needs:  Kcal:  1400-1600 kcal Protein:  70-80 grams Fluid:  >/= 1.5 L/day     Jarome Matin, MS, RD, LDN, CNSC Inpatient Clinical Dietitian RD pager # available in [facility]  After hours/weekend pager # available in [facility]

## 2020-12-24 DIAGNOSIS — R197 Diarrhea, unspecified: Secondary | ICD-10-CM | POA: Diagnosis not present

## 2020-12-24 DIAGNOSIS — E876 Hypokalemia: Secondary | ICD-10-CM | POA: Diagnosis not present

## 2020-12-24 DIAGNOSIS — N390 Urinary tract infection, site not specified: Secondary | ICD-10-CM | POA: Diagnosis not present

## 2020-12-24 DIAGNOSIS — I10 Essential (primary) hypertension: Secondary | ICD-10-CM | POA: Diagnosis not present

## 2020-12-24 LAB — BASIC METABOLIC PANEL
Anion gap: 6 (ref 5–15)
BUN: 26 mg/dL — ABNORMAL HIGH (ref 8–23)
CO2: 25 mmol/L (ref 22–32)
Calcium: 8.3 mg/dL — ABNORMAL LOW (ref 8.9–10.3)
Chloride: 104 mmol/L (ref 98–111)
Creatinine, Ser: 0.95 mg/dL (ref 0.44–1.00)
GFR, Estimated: 58 mL/min — ABNORMAL LOW (ref >60)
Glucose, Bld: 99 mg/dL (ref 70–99)
Potassium: 4.1 mmol/L (ref 3.5–5.1)
Sodium: 135 mmol/L (ref 135–145)

## 2020-12-24 LAB — CBC WITH DIFFERENTIAL/PLATELET
Abs Immature Granulocytes: 0.1 10*3/uL — ABNORMAL HIGH (ref 0.00–0.07)
Basophils Absolute: 0 10*3/uL (ref 0.0–0.1)
Basophils Relative: 0 %
Eosinophils Absolute: 0.2 10*3/uL (ref 0.0–0.5)
Eosinophils Relative: 3 %
HCT: 34 % — ABNORMAL LOW (ref 36.0–46.0)
Hemoglobin: 10.9 g/dL — ABNORMAL LOW (ref 12.0–15.0)
Immature Granulocytes: 1 %
Lymphocytes Relative: 15 %
Lymphs Abs: 1.1 10*3/uL (ref 0.7–4.0)
MCH: 29.8 pg (ref 26.0–34.0)
MCHC: 32.1 g/dL (ref 30.0–36.0)
MCV: 92.9 fL (ref 80.0–100.0)
Monocytes Absolute: 0.6 10*3/uL (ref 0.1–1.0)
Monocytes Relative: 8 %
Neutro Abs: 5.4 10*3/uL (ref 1.7–7.7)
Neutrophils Relative %: 73 %
Platelets: 225 10*3/uL (ref 150–400)
RBC: 3.66 MIL/uL — ABNORMAL LOW (ref 3.87–5.11)
RDW: 13.5 % (ref 11.5–15.5)
WBC: 7.3 10*3/uL (ref 4.0–10.5)
nRBC: 0 % (ref 0.0–0.2)

## 2020-12-24 LAB — MAGNESIUM: Magnesium: 2 mg/dL (ref 1.7–2.4)

## 2020-12-24 MED ORDER — CARVEDILOL 12.5 MG PO TABS
12.5000 mg | ORAL_TABLET | Freq: Two times a day (BID) | ORAL | Status: DC
  Administered 2020-12-25 – 2020-12-30 (×13): 12.5 mg via ORAL
  Filled 2020-12-24 (×13): qty 1

## 2020-12-24 NOTE — NC FL2 (Signed)
[place] MEDICAID FL2 LEVEL OF CARE SCREENING TOOL     IDENTIFICATION  Patient Name: [name] Birthdate: 08-Nov-1930 Sex: female Admission Date (Current Location): 12/20/2020  [facility] and [place] Number:  Herbalist and Address:  [facility],  [address] [place], [place]      Provider Number: [id number]  Attending Physician Name and Address:  Eugenie Filler, MD  Relative Name and Phone Number:  Darlys, Buis [phone]  [phone]    Current Level of Care: Hospital Recommended Level of Care: [place] Prior Approval Number:    Date Approved/Denied:   PASRR Number: [id number]  Discharge Plan: SNF    Current Diagnoses: Patient Active Problem List   Diagnosis Date Noted   Protein-calorie malnutrition, severe 12/23/2020   Diarrhea    Paroxysmal atrial fibrillation (HCC)    CHF NYHA class I (no symptoms from ordinary activities), acute, systolic ([place])    UTI (urinary tract infection) 12/22/2020   Acute lower UTI (urinary tract infection) 12/21/2020   Hypokalemia 12/21/2020   Volume depletion 12/21/2020   Sepsis ([place]) 01/17/2019   Acute respiratory failure with hypoxia ([place]) 01/17/2019   Pneumonia due to COVID-19 virus 01/17/2019   Pulmonary nodule 01/17/2019   Thoracic aortic aneurysm 01/17/2019   Fall 12/21/2018   General weakness 12/20/2018   Pressure injury of skin 12/20/2018   Meningioma ([place]) 12/20/2018   AKI (acute kidney injury) ([place]) 12/20/2018   Fall at home, initial encounter 04/19/2017   Bilateral hip dislocation ([place]) 04/19/2017   Uncontrolled pain 04/19/2017   Hypertension 04/19/2017   Leukocytosis 04/19/2017   Elevated troponin 04/19/2017   Osteoarthritis of hand 01/01/2016   Osteoarthritis of foot 01/01/2016   Unspecified osteoarthritis, unspecified site 01/01/2016   S/p reverse total shoulder arthroplasty 08/27/2014   Chronic diarrhea 12/22/2011    Orientation RESPIRATION BLADDER  Height & Weight     Self, Time, Situation, Place  Normal Incontinent Weight: 51.7 kg Height:  5\' 5"  (165.1 cm)  BEHAVIORAL SYMPTOMS/MOOD NEUROLOGICAL BOWEL NUTRITION STATUS      Incontinent Diet  AMBULATORY STATUS COMMUNICATION OF NEEDS Skin   Extensive Assist Verbally Other (Comment) (Abrasion Ecchymosis, elbow, shoulder left and right)                       Personal Care Assistance Level of Assistance  Bathing, Feeding, Dressing Bathing Assistance: Maximum assistance Feeding assistance: Independent Dressing Assistance: Maximum assistance     Functional Limitations Info  Sight, Hearing, Speech Sight Info: Impaired Hearing Info: Adequate Speech Info: Adequate    SPECIAL CARE FACTORS FREQUENCY  PT (By licensed PT), OT (By licensed OT)     PT Frequency: x5 week OT Frequency: x5 week            Contractures Contractures Info: Not present    Additional Factors Info  Code Status, Allergies Code Status Info: FULL Allergies Info: Hydromorphone, Lisinopril, Mirtazapine, Nitrofuran Derivatives, Septra (Bactrim), Sulfamethoxazole-trimethoprim           Current Medications (12/24/2020):  This is the current hospital active medication list Current Facility-Administered Medications  Medication Dose Route Frequency Provider Last Rate Last Admin   (feeding supplement) PROSource Plus liquid 30 mL  30 mL Oral BID BM Eugenie Filler, MD   30 mL at 12/24/20 1251   acetaminophen (TYLENOL) tablet 650 mg  650 mg Oral Q6H PRN Reubin Milan, MD   650 mg at 12/21/20 1609   Or   acetaminophen (  TYLENOL) suppository 650 mg  650 mg Rectal Q6H PRN Reubin Milan, MD       apixaban Arne [place]) tablet 2.5 mg  2.5 mg Oral BID Croitoru, Mihai, MD   2.5 mg at 12/24/20 1002   carvedilol (COREG) tablet 12.5 mg  12.5 mg Oral BID Croitoru, Mihai, MD       cefadroxil (DURICEF) capsule 500 mg  500 mg Oral BID Eugenie Filler, MD   500 mg at 12/24/20 1251   feeding supplement (BOOST  / RESOURCE BREEZE) liquid 1 Container  1 Container Oral BID BM Eugenie Filler, MD   1 Container at 12/24/20 1011   lactated ringers infusion   Intravenous Continuous Eugenie Filler, MD   Held at 12/21/20 1626   losartan (COZAAR) tablet 12.5 mg  12.5 mg Oral Daily Croitoru, Mihai, MD   12.5 mg at 12/24/20 1001   multivitamin with minerals tablet 1 tablet  1 tablet Oral Daily Eugenie Filler, MD   1 tablet at 12/24/20 1003   ondansetron (ZOFRAN) tablet 4 mg  4 mg Oral Q6H PRN Reubin Milan, MD       Or   ondansetron [facility]) injection 4 mg  4 mg Intravenous Q6H PRN Reubin Milan, MD       Opium 10 MG/ML (1%) tincture 6 mg  0.6 mL Oral TID AC & HS Eugenie Filler, MD   6 mg at 12/24/20 1251     Discharge Medications: Please see discharge summary for a list of discharge medications.  Relevant Imaging Results:  Relevant Lab Results:   Additional Information [phone]  Purcell Mouton, RN

## 2020-12-24 NOTE — Progress Notes (Signed)
Noted another brief episode of paroxysmal atrial fibrillation from 3 to 4 AM, with ventricular rates in 140s. BP is still high. Will increase the carvedilol dose.  Medication Recommendations:   - carvedilol 12.5 mg twice daily - losartan 12.5 mg daily - Eliquis 2.5 mg twice daily Other recommendations (labs, testing, etc): limited echo in 2-3 months Follow up as an outpatient:  AFib clinic in 2 weeks, loop recorder in office (if she agrees to it after consultation w family)

## 2020-12-24 NOTE — TOC Progression Note (Signed)
Transition of Care [facility]) - Progression Note    Patient Details  Name: [name] MRN: [id number] Date of Birth: 18-Oct-1930  Transition of Care [facility]) CM/SW Contact  Purcell Mouton, RN Phone Number: 12/24/2020, 1:25 PM  Clinical Narrative:    Spoke with pt concerning SNF. Pt agreed to going, will fax clinicals out to facilities.    Expected Discharge Plan: [place] Barriers to Discharge: No Barriers Identified  Expected Discharge Plan and Services Expected Discharge Plan: [place] arrangements for the past 2 months: Single Family Home                                       Social Determinants of Health (SDOH) Interventions    Readmission Risk Interventions No flowsheet data found.

## 2020-12-24 NOTE — Progress Notes (Signed)
PROGRESS NOTE    [name]  PYP:[id number] DOB: 1930-07-30 DOA: 12/20/2020 PCP: Lajean Manes, MD   Chief Complaint  Patient presents with   Diarrhea    Brief Narrative: [name], 87 y.o. female with PMH of anemia, osteoarthritis, cancer, chronic diarrhea, COPD, depression, GERD, goiter, headache, hypertension, unspecified peripheral neuropathy, osteoporosis, history of UTI presented from assisted living facility with complaint of generalized weakness, diarrhea x3 days. In the ED vitals fairly stable, although had episode of tachycardia in 130s to 140s, given IV fluids, labs showed UA consistent with UTI, mild hypokalemia creatinine 1.2. Patient is kept on IV fluids and antibiotics and admitted   Assessment & Plan:   Principal Problem:   Acute lower UTI (urinary tract infection) Active Problems:   Hypertension   General weakness   Hypokalemia   Volume depletion   UTI (urinary tract infection)   Protein-calorie malnutrition, severe   Diarrhea   Paroxysmal atrial fibrillation (HCC)   CHF NYHA class I (no symptoms from ordinary activities), acute, systolic (HCC)  #1 E. coli UTI -Afebrile.  Normal white count. -Improving clinically. -Urine cultures with E. coli UTI resistant to ampicillin, Bactrim, Zosyn.  Sensitive to the cephalosporins, fluoroquinolones, gentamicin, imipenem, Bactrim. -Was on IV Rocephin and has been transitioned to cefadroxil today to complete course of antibiotic treatment.  2.  A flutter/A. fib with RVR CHA2DS2VASC score 4 -Patient noted to have converted to normal sinus rhythm this morning after being on Cardizem drip.. -2D echo with EF of 40 to 45%, global hypokinesis moderate LVH.  Right ventricular systolic function normal.  Left atrial size mildly dilated -Patient being followed by cardiology, amlodipine discontinued and patient on Coreg of 6.25 mg twice daily for rate control and dose adjusted to 12.5 mg twice daily for better blood pressure  control. -Continue low-dose Eliquis as recommended per cardiology for anticoagulation. -Cardiology recommending a 30-day heart monitor to evaluate for recurrence of A. fib/flutter on discharge. -Patient will need to be closely monitored for falls and anemia. -Per cardiology.  3.  Newly recognized CHF -2D echo with EF of 40 to 45% with global hypokinesis, LVH, mild MR, moderate TR. -Patient clinically does not look volume overloaded on examination and has been having chronic intermittent diarrhea. -Continue Coreg and dose increased for better blood pressure management. -Continue losartan as recommended by cardiology. -We will need outpatient follow-up with cardiology with repeat limited 2D echo in 2 to 3 months.  4.  Hypertension -Norvasc discontinued. -Coreg dose increased to 12.5 mg twice daily per cardiology recommendations and patient started on losartan 12.5 mg daily.  5.  Chronic diarrhea -The patient has had diarrhea since her colon surgery and opium tincture usually helps manage it. -Stool studies are negative for C. difficile PCR. -Patient states usually takes a opium tincture before meals which she was not initially getting early on in the hospitalization and currently placed on home regimen of before meals.   -Continue opium tincture.   -Outpatient follow-up.  6.  Generalized weakness/debility/deconditioning -Patient seen by PT OT who are recommending SNF. -TOC consulted for SNF placement.  7.  Hypokalemia -Repleted. -Potassium of 4.1, magnesium at 2.0  8.  Severe protein calorie malnutrition -Continue nutritional supplementation.   9.  Pressure injury stage II on the coccyx, POA Pressure Injury 12/20/18 Coccyx Medial Stage II -  Partial thickness loss of dermis presenting as a shallow open ulcer with a red, pink wound bed without slough. pink, open skin, non-blanchable (Active)  12/20/18 1415  Location: Coccyx  Location Orientation: Medial  Staging: Stage II -   Partial thickness loss of dermis presenting as a shallow open ulcer with a red, pink wound bed without slough.  Wound Description (Comments): pink, open skin, non-blanchable  Present on Admission: Yes         DVT prophylaxis: Eliquis Code Status: Full Family Communication: Updated patient.  No family at bedside. Disposition:   Status is: Inpatient  Remains inpatient appropriate because: Ongoing management of patient's tachycardia, UTI, generalized weakness.  Unsafe disposition.       Consultants:  Cardiology: Dr. Sallyanne Kuster 12/23/2018  Procedures:  2D echo 12/22/2020  Antimicrobials:  IV Rocephin 12/21/2020>>>>> 12/23/2020 Cefadroxil 12/24/2020>>>>> 12/28/2020   Subjective: Patient sitting up in chair.  Denies any chest pain.  No shortness of breath.  No abdominal pain.  States she is just confused about ordering her lunch.  Noted to have a bout of paroxysmal A. fib early this morning.     Objective: Vitals:   12/23/20 1406 12/23/20 1513 12/23/20 2146 12/24/20 0430  BP: (!) 141/70  (!) 168/89 (!) 142/66  Pulse: 67  85 88  Resp: 18  20 17   Temp: 98.2 F (36.8 C)  (!) 97.5 F (36.4 C) 98.3 F (36.8 C)  TempSrc: Oral  Oral Oral  SpO2: 97%  100% 92%  Weight:  51.7 kg    Height:  5\' 5"  (1.651 m)      Intake/Output Summary (Last 24 hours) at 12/24/2020 1303 Last data filed at 12/24/2020 0432 Gross per 24 hour  Intake 275.01 ml  Output 300 ml  Net -24.99 ml    Filed Weights   12/23/20 1513  Weight: 51.7 kg    Examination:  General exam: : NAD.  Frail Respiratory system: CTA B.  No wheezes, no rhonchi.  Speaking in full sentences.  Normal respiratory effort. Cardiovascular system: Regular rate and rhythm no murmurs rubs or gallops.  No JVD.  No lower extremity edema.  Gastrointestinal system: Abdomen soft, nontender, nondistended, positive bowel sounds.  No rebound.  No guarding. Central nervous system: Alert and oriented. No focal neurological  deficits. Extremities: Symmetric 5 x 5 power. Skin: No rashes, lesions or ulcers Psychiatry: Judgement and insight appear normal. Mood & affect appropriate.   Data Reviewed: I have personally reviewed following labs and imaging studies  CBC: Recent Labs  Lab 12/20/20 1554 12/21/20 1055 12/23/20 0400 12/24/20 0842  WBC 8.1 6.8 6.9 7.3  NEUTROABS 6.9  --   --  5.4  HGB 13.8 12.5 10.4* 10.9*  HCT 43.5 38.2 32.4* 34.0*  MCV 93.1 91.0 91.8 92.9  PLT 212 214 188 225     Basic Metabolic Panel: Recent Labs  Lab 12/20/20 1554 12/21/20 1055 12/22/20 0435 12/23/20 0400 12/24/20 0842  NA 138 137 133* 131* 135  K 3.4* 3.4* 3.7 3.7 4.1  CL 101 100 99 102 104  CO2 25 26 27 24 25   GLUCOSE 143* 103* 95 124* 99  BUN 27* 27* 24* 21 26*  CREATININE 1.22* 0.96 0.91 1.06* 0.95  CALCIUM 9.3 8.8* 8.3* 8.0* 8.3*  MG  --  2.0  --   --  2.0  PHOS  --  2.9  --   --   --      GFR: Estimated Creatinine Clearance: 33.4 mL/min (by C-G formula based on SCr of 0.95 mg/dL).  Liver Function Tests: Recent Labs  Lab 12/20/20 1554 12/21/20 1055  AST 33 22  ALT 19 17  ALKPHOS  100 82  BILITOT 0.7 0.5  PROT 7.6 6.7  ALBUMIN 3.7 3.1*     CBG: No results for input(s): GLUCAP in the last 168 hours.   Recent Results (from the past 240 hour(s))  Resp Panel by RT-PCR (Flu A&B, Covid) Nasopharyngeal Swab     Status: None   Collection Time: 12/21/20 10:54 AM   Specimen: Nasopharyngeal Swab; Nasopharyngeal(NP) swabs in vial transport medium  Result Value Ref Range Status   SARS Coronavirus 2 by RT PCR NEGATIVE NEGATIVE Final    Comment: (NOTE) SARS-CoV-2 target nucleic acids are NOT DETECTED.  The SARS-CoV-2 RNA is generally detectable in upper respiratory specimens during the acute phase of infection. The lowest concentration of SARS-CoV-2 viral copies this assay can detect is 138 copies/mL. A negative result does not preclude SARS-Cov-2 infection and should not be used as the sole basis  for treatment or other patient management decisions. A negative result may occur with  improper specimen collection/handling, submission of specimen other than nasopharyngeal swab, presence of viral mutation(s) within the areas targeted by this assay, and inadequate number of viral copies(<138 copies/mL). A negative result must be combined with clinical observations, patient history, and epidemiological information. The expected result is Negative.  Fact Sheet for Patients:  EntrepreneurPulse.com.au  Fact Sheet for Healthcare Providers:  IncredibleEmployment.be  This test is no t yet approved or cleared by the [place] FDA and  has been authorized for detection and/or diagnosis of SARS-CoV-2 by FDA under an Emergency Use Authorization (EUA). This EUA will remain  in effect (meaning this test can be used) for the duration of the COVID-19 declaration under Section 564(b)(1) of the Act, 21 U.S.C.section 360bbb-3(b)(1), unless the authorization is terminated  or revoked sooner.       Influenza A by PCR NEGATIVE NEGATIVE Final   Influenza B by PCR NEGATIVE NEGATIVE Final    Comment: (NOTE) The Xpert Xpress SARS-CoV-2/FLU/RSV plus assay is intended as an aid in the diagnosis of influenza from Nasopharyngeal swab specimens and should not be used as a sole basis for treatment. Nasal washings and aspirates are unacceptable for Xpert Xpress SARS-CoV-2/FLU/RSV testing.  Fact Sheet for Patients: EntrepreneurPulse.com.au  Fact Sheet for Healthcare Providers: IncredibleEmployment.be  This test is not yet approved or cleared by the [place] FDA and has been authorized for detection and/or diagnosis of SARS-CoV-2 by FDA under an Emergency Use Authorization (EUA). This EUA will remain in effect (meaning this test can be used) for the duration of the COVID-19 declaration under Section 564(b)(1) of the Act, 21  U.S.C. section 360bbb-3(b)(1), unless the authorization is terminated or revoked.  Performed at [facility], [place] [address], [place], [place] [address]   Urine Culture     Status: Abnormal   Collection Time: 12/21/20 11:07 AM   Specimen: Urine, Clean Catch  Result Value Ref Range Status   Specimen Description   Final    URINE, CLEAN CATCH Performed at [facility], [place] [address], [place], [place] [address]    Special Requests   Final    NONE Performed at [facility], [place] [address], [place], [place] [address]    Culture >=100,000 COLONIES/mL ESCHERICHIA COLI (A)  Final   Report Status 12/23/2020 FINAL  Final   Organism ID, Bacteria ESCHERICHIA COLI (A)  Final      Susceptibility   Escherichia coli - MIC*    AMPICILLIN >=32 RESISTANT Resistant     CEFAZOLIN <=4 SENSITIVE Sensitive  CEFEPIME <=0.12 SENSITIVE Sensitive     CEFTRIAXONE <=0.25 SENSITIVE Sensitive     CIPROFLOXACIN <=0.25 SENSITIVE Sensitive     GENTAMICIN <=1 SENSITIVE Sensitive     IMIPENEM <=0.25 SENSITIVE Sensitive     NITROFURANTOIN 32 SENSITIVE Sensitive     TRIMETH/SULFA >=320 RESISTANT Resistant     AMPICILLIN/SULBACTAM >=32 RESISTANT Resistant     PIP/TAZO <=4 SENSITIVE Sensitive     * >=100,000 COLONIES/mL ESCHERICHIA COLI  Gastrointestinal Panel by PCR , Stool     Status: None   Collection Time: 12/21/20  4:19 PM   Specimen: Stool  Result Value Ref Range Status   Campylobacter species NOT DETECTED NOT DETECTED Final   Plesimonas shigelloides NOT DETECTED NOT DETECTED Final   Salmonella species NOT DETECTED NOT DETECTED Final   Yersinia enterocolitica NOT DETECTED NOT DETECTED Final   Vibrio species NOT DETECTED NOT DETECTED Final   Vibrio cholerae NOT DETECTED NOT DETECTED Final   Enteroaggregative E coli (EAEC) NOT DETECTED NOT DETECTED Final   Enteropathogenic E coli (EPEC) NOT DETECTED NOT DETECTED Final   Enterotoxigenic E coli  (ETEC) NOT DETECTED NOT DETECTED Final   Shiga like toxin producing E coli (STEC) NOT DETECTED NOT DETECTED Final   Shigella/Enteroinvasive E coli (EIEC) NOT DETECTED NOT DETECTED Final   Cryptosporidium NOT DETECTED NOT DETECTED Final   Cyclospora cayetanensis NOT DETECTED NOT DETECTED Final   Entamoeba histolytica NOT DETECTED NOT DETECTED Final   Giardia lamblia NOT DETECTED NOT DETECTED Final   Adenovirus F40/41 NOT DETECTED NOT DETECTED Final   Astrovirus NOT DETECTED NOT DETECTED Final   Norovirus GI/GII NOT DETECTED NOT DETECTED Final   Rotavirus A NOT DETECTED NOT DETECTED Final   Sapovirus (I, II, IV, and V) NOT DETECTED NOT DETECTED Final    Comment: Performed at [facility], [place] [address] [place], [place] [address]  C Difficile Quick Screen w PCR reflex     Status: None   Collection Time: 12/21/20  4:19 PM   Specimen: Stool  Result Value Ref Range Status   C Diff antigen NEGATIVE NEGATIVE Final   C Diff toxin NEGATIVE NEGATIVE Final   C Diff interpretation No C. difficile detected.  Final    Comment: Performed at [facility], [place] [address] [place], [place] [address]          Radiology Studies: ECHOCARDIOGRAM COMPLETE  Result Date: 12/22/2020    ECHOCARDIOGRAM REPORT   Patient Name:   [name] Date of Exam: 12/22/2020 Medical Rec #:  [id number]   Height:       66.0 in Accession #:    [id number]  Weight:       113.0 lb Date of Birth:  [birth year]   BSA:          1.569 m Patient Age:    22 years    BP:           127/94 mmHg Patient Gender: F           HR:           106 bpm. Exam Location:  Outpatient Procedure: 2D Echo, Cardiac Doppler and Color Doppler Indications:    AFIB  History:        Patient has no prior history of Echocardiogram examinations.                 Risk Factors:Hypertension.  Sonographer:    Jyl Heinz Referring Phys: [id number] [place]  1. Moderate  septal hypertrophy with otherwise mild concentric  LVH. Left ventricular ejection fraction, by estimation, is 40 to 45%. The left ventricle has mildly decreased function. The left ventricle demonstrates global hypokinesis. There is moderate left ventricular hypertrophy. Left ventricular diastolic parameters are indeterminate. Elevated left ventricular end-diastolic pressure.  2. Right ventricular systolic function is normal. The right ventricular size is normal. There is mildly elevated pulmonary artery systolic pressure.  3. Left atrial size was mildly dilated.  4. The mitral valve is normal in structure. Mild mitral valve regurgitation. No evidence of mitral stenosis.  5. Tricuspid valve regurgitation is moderate.  6. The aortic valve is tricuspid. There is mild calcification of the aortic valve. There is mild thickening of the aortic valve. Aortic valve regurgitation is not visualized. No aortic stenosis is present.  7. The inferior vena cava is dilated in size with >50% respiratory variability, suggesting right atrial pressure of 8 mmHg. FINDINGS  Left Ventricle: Moderate septal hypertrophy with otherwise mild concentric LVH. Left ventricular ejection fraction, by estimation, is 40 to 45%. The left ventricle has mildly decreased function. The left ventricle demonstrates global hypokinesis. The left ventricular internal cavity size was normal in size. There is moderate left ventricular hypertrophy. Left ventricular diastolic parameters are indeterminate. Elevated left ventricular end-diastolic pressure. Right Ventricle: The right ventricular size is normal. No increase in right ventricular wall thickness. Right ventricular systolic function is normal. There is mildly elevated pulmonary artery systolic pressure. The tricuspid regurgitant velocity is 2.88  m/s, and with an assumed right atrial pressure of 8 mmHg, the estimated right ventricular systolic pressure is 62.1 mmHg. Left Atrium: Left atrial size was mildly dilated. Right Atrium: Right atrial size was  normal in size. Pericardium: There is no evidence of pericardial effusion. Mitral Valve: The mitral valve is normal in structure. Mild mitral valve regurgitation. No evidence of mitral valve stenosis. Tricuspid Valve: The tricuspid valve is normal in structure. Tricuspid valve regurgitation is moderate . No evidence of tricuspid stenosis. Aortic Valve: The aortic valve is tricuspid. There is mild calcification of the aortic valve. There is mild thickening of the aortic valve. Aortic valve regurgitation is not visualized. No aortic stenosis is present. Aortic valve peak gradient measures 6.0 mmHg. Pulmonic Valve: The pulmonic valve was normal in structure. Pulmonic valve regurgitation is trivial. No evidence of pulmonic stenosis. Aorta: The aortic root is normal in size and structure. Venous: The inferior vena cava is dilated in size with greater than 50% respiratory variability, suggesting right atrial pressure of 8 mmHg. IAS/Shunts: No atrial level shunt detected by color flow Doppler.  LEFT VENTRICLE PLAX 2D LVIDd:         3.40 cm     Diastology LVIDs:         3.00 cm     LV e' medial:    7.51 cm/s LV PW:         1.10 cm     LV E/e' medial:  16.2 LV IVS:        1.50 cm     LV e' lateral:   8.27 cm/s LVOT diam:     2.00 cm     LV E/e' lateral: 14.8 LV SV:         45 LV SV Index:   29 LVOT Area:     3.14 cm  LV Volumes (MOD) LV vol d, MOD A2C: 58.3 ml LV vol d, MOD A4C: 73.1 ml LV vol s, MOD A2C: 34.8 ml LV vol s, MOD  A4C: 42.4 ml LV SV MOD A2C:     23.5 ml LV SV MOD A4C:     73.1 ml LV SV MOD BP:      27.9 ml RIGHT VENTRICLE             IVC RV Basal diam:  4.20 cm     IVC diam: 2.30 cm RV S prime:     12.50 cm/s TAPSE (M-mode): 2.0 cm LEFT ATRIUM             Index        RIGHT ATRIUM           Index LA diam:        3.50 cm 2.23 cm/m   RA Area:     16.10 cm LA Vol (A2C):   48.2 ml 30.72 ml/m  RA Volume:   42.70 ml  27.22 ml/m LA Vol (A4C):   39.0 ml 24.86 ml/m LA Biplane Vol: 44.5 ml 28.36 ml/m  AORTIC VALVE  AV Area (Vmax): 2.07 cm AV Vmax:        122.00 cm/s AV Peak Grad:   6.0 mmHg LVOT Vmax:      80.20 cm/s LVOT Vmean:     56.450 cm/s LVOT VTI:       0.143 m  AORTA Ao Root diam: 3.40 cm Ao Asc diam:  3.40 cm MITRAL VALVE                TRICUSPID VALVE MV Area (PHT): 5.27 cm     TR Peak grad:   33.2 mmHg MV Decel Time: 144 msec     TR Vmax:        288.00 cm/s MR Peak grad: 101.6 mmHg MR Mean grad: 70.0 mmHg     SHUNTS MR Vmax:      504.00 cm/s   Systemic VTI:  0.14 m MR Vmean:     403.5 cm/s    Systemic Diam: 2.00 cm MV E velocity: 122.00 cm/s MV A velocity: 85.40 cm/s MV E/A ratio:  1.43 Skeet Latch MD Electronically signed by Skeet Latch MD Signature Date/Time: 12/22/2020/6:06:35 PM    Final         Scheduled Meds:  (feeding supplement) PROSource Plus  30 mL Oral BID BM   apixaban  2.5 mg Oral BID   carvedilol  12.5 mg Oral BID   cefadroxil  500 mg Oral BID   feeding supplement  1 Container Oral BID BM   losartan  12.5 mg Oral Daily   multivitamin with minerals  1 tablet Oral Daily   Opium  0.6 mL Oral TID AC & HS   Continuous Infusions:  lactated ringers Stopped (12/21/20 1626)     LOS: 2 days    Time spent: 35 minutes    Irine Seal, MD [facility]   To contact the attending provider between 7A-7P or the covering provider during after hours 7P-7A, please log into the web site [email or web address] and access using universal [place] password for that web site. If you do not have the password, please call the hospital operator.  12/24/2020, 1:03 PM

## 2020-12-24 NOTE — Plan of Care (Signed)
  Problem: Clinical Measurements: Goal: Ability to maintain clinical measurements within normal limits will improve Outcome: Progressing Goal: Cardiovascular complication will be avoided Outcome: Progressing   Problem: Nutrition: Goal: Adequate nutrition will be maintained Outcome: Progressing

## 2020-12-24 NOTE — Care Management Important Message (Signed)
Medicare IM printed for W/L Social Work to give to the patient. 

## 2020-12-25 DIAGNOSIS — N390 Urinary tract infection, site not specified: Secondary | ICD-10-CM | POA: Diagnosis not present

## 2020-12-25 DIAGNOSIS — R197 Diarrhea, unspecified: Secondary | ICD-10-CM | POA: Diagnosis not present

## 2020-12-25 DIAGNOSIS — E876 Hypokalemia: Secondary | ICD-10-CM | POA: Diagnosis not present

## 2020-12-25 DIAGNOSIS — I10 Essential (primary) hypertension: Secondary | ICD-10-CM | POA: Diagnosis not present

## 2020-12-25 LAB — BASIC METABOLIC PANEL
Anion gap: 7 (ref 5–15)
BUN: 34 mg/dL — ABNORMAL HIGH (ref 8–23)
CO2: 25 mmol/L (ref 22–32)
Calcium: 8.2 mg/dL — ABNORMAL LOW (ref 8.9–10.3)
Chloride: 99 mmol/L (ref 98–111)
Creatinine, Ser: 0.79 mg/dL (ref 0.44–1.00)
GFR, Estimated: >60 mL/min (ref >60)
Glucose, Bld: 111 mg/dL — ABNORMAL HIGH (ref 70–99)
Potassium: 4.2 mmol/L (ref 3.5–5.1)
Sodium: 131 mmol/L — ABNORMAL LOW (ref 135–145)

## 2020-12-25 LAB — CBC
HCT: 34.9 % — ABNORMAL LOW (ref 36.0–46.0)
Hemoglobin: 11 g/dL — ABNORMAL LOW (ref 12.0–15.0)
MCH: 29.3 pg (ref 26.0–34.0)
MCHC: 31.5 g/dL (ref 30.0–36.0)
MCV: 92.8 fL (ref 80.0–100.0)
Platelets: 270 10*3/uL (ref 150–400)
RBC: 3.76 MIL/uL — ABNORMAL LOW (ref 3.87–5.11)
RDW: 13.6 % (ref 11.5–15.5)
WBC: 7.6 10*3/uL (ref 4.0–10.5)
nRBC: 0 % (ref 0.0–0.2)

## 2020-12-25 LAB — MAGNESIUM: Magnesium: 1.9 mg/dL (ref 1.7–2.4)

## 2020-12-25 MED ORDER — HYDRALAZINE HCL 20 MG/ML IJ SOLN
10.0000 mg | Freq: Four times a day (QID) | INTRAMUSCULAR | Status: DC | PRN
  Administered 2020-12-28: 10 mg via INTRAVENOUS
  Filled 2020-12-25 (×2): qty 1

## 2020-12-25 NOTE — Progress Notes (Signed)
PROGRESS NOTE    [name]  QAS:[id number] DOB: 03-29-1930 DOA: 12/20/2020 PCP: Lajean Manes, MD   Chief Complaint  Patient presents with   Diarrhea    Brief Narrative: [name], 87 y.o. female with PMH of anemia, osteoarthritis, cancer, chronic diarrhea, COPD, depression, GERD, goiter, headache, hypertension, unspecified peripheral neuropathy, osteoporosis, history of UTI presented from assisted living facility with complaint of generalized weakness, diarrhea x3 days. In the ED vitals fairly stable, although had episode of tachycardia in 130s to 140s, given IV fluids, labs showed UA consistent with UTI, mild hypokalemia creatinine 1.2. Patient is kept on IV fluids and antibiotics and admitted   Assessment & Plan:   Principal Problem:   Acute lower UTI (urinary tract infection) Active Problems:   Hypertension   General weakness   Hypokalemia   Volume depletion   UTI (urinary tract infection)   Protein-calorie malnutrition, severe   Diarrhea   Paroxysmal atrial fibrillation (HCC)   CHF NYHA class I (no symptoms from ordinary activities), acute, systolic (HCC)  #1 E. coli UTI -Afebrile.  Normal white count. -Improving clinically. -Urine cultures with E. coli UTI resistant to ampicillin, Bactrim, Zosyn.  Sensitive to the cephalosporins, fluoroquinolones, gentamicin, imipenem, Bactrim. -Was on IV Rocephin and has been transitioned to cefadroxil to complete course of antibiotic treatment.  2.  A flutter/A. fib with RVR CHA2DS2VASC score 4 -Patient noted to have converted to normal sinus rhythm this morning after being on Cardizem drip.. -2D echo with EF of 40 to 45%, global hypokinesis moderate LVH.  Right ventricular systolic function normal.  Left atrial size mildly dilated -Patient being followed by cardiology, amlodipine discontinued and patient on Coreg of 6.25 mg twice daily for rate control and dose adjusted to 12.5 mg twice daily for better blood pressure  control. -Continue low-dose Eliquis as recommended per cardiology for anticoagulation. -Cardiology recommending a 30-day heart monitor to evaluate for recurrence of A. fib/flutter on discharge. -Patient will need to be closely monitored for falls and anemia. -Per cardiology.  3.  Newly recognized CHF -2D echo with EF of 40 to 45% with global hypokinesis, LVH, mild MR, moderate TR. -Patient clinically does not look volume overloaded on examination and has been having chronic intermittent diarrhea. -Continue increased dose of Coreg, losartan.  -Will need outpatient follow-up with cardiology with repeat limited 2D echo in 2 to 3 months.  4.  Hypertension -Norvasc discontinued. -Coreg increased to 12.5 mg twice daily per cardiology recommendations and patient started on losartan 12.5 mg daily.  5.  Chronic diarrhea -The patient has had diarrhea since her colon surgery and opium tincture usually helps manage it. -Stool studies are negative for C. difficile PCR. -Patient states usually takes a opium tincture before meals which she was not initially getting early on in the hospitalization and currently placed on home regimen of before meals.   -Continue opium tincture.   -Outpatient follow-up.  6.  Generalized weakness/debility/deconditioning -Patient seen by PT OT who are recommending SNF. -TOC consulted for SNF placement.  7.  Hypokalemia -Repleted. -Potassium of 4.2, magnesium at 1.9.    8.  Severe protein calorie malnutrition -Nutritional supplementation.   9.  Pressure injury stage II on the coccyx, POA Pressure Injury 12/20/18 Coccyx Medial Stage II -  Partial thickness loss of dermis presenting as a shallow open ulcer with a red, pink wound bed without slough. pink, open skin, non-blanchable (Active)  12/20/18 1415  Location: Coccyx  Location Orientation: Medial  Staging: Stage II -  Partial thickness loss of dermis presenting as a shallow open ulcer with a red, pink wound bed  without slough.  Wound Description (Comments): pink, open skin, non-blanchable  Present on Admission: Yes         DVT prophylaxis: Eliquis Code Status: Full Family Communication: Updated patient.  No family at bedside. Disposition:   Status is: Inpatient  Remains inpatient appropriate because: Ongoing management of patient's tachycardia, UTI, generalized weakness.  Unsafe disposition.       Consultants:  Cardiology: Dr. Sallyanne Kuster 12/23/2018  Procedures:  2D echo 12/22/2020  Antimicrobials:  IV Rocephin 12/21/2020>>>>> 12/23/2020 Cefadroxil 12/24/2020>>>>> 12/28/2020   Subjective: Sitting up in bed eating breakfast.  Denies any chest pain.  No shortness of breath.  Denies any further diarrhea.  Overall feeling better.    Objective: Vitals:   12/25/20 0204 12/25/20 0300 12/25/20 0400 12/25/20 0454  BP: (!) 200/100 134/69  (!) 155/97  Pulse:  89  83  Resp: 15 (!) 22 12 18   Temp:    (!) 97.3 F (36.3 C)  TempSrc:    Oral  SpO2:    96%  Weight:      Height:        Intake/Output Summary (Last 24 hours) at 12/25/2020 1032 Last data filed at 12/25/2020 0400 Gross per 24 hour  Intake 900 ml  Output 350 ml  Net 550 ml    Filed Weights   12/23/20 1513  Weight: 51.7 kg    Examination:  General exam: : NAD.  Frail. Respiratory system: CTA B.  No wheezes, no rhonchi.  Speaking in full sentences.  Normal respiratory effort. Cardiovascular system: Regular rate and rhythm no murmurs rubs or gallops.  No JVD.  No lower extremity edema.  Gastrointestinal system: Abdomen soft, nontender, nondistended, positive bowel sounds.  No rebound.  No guarding. Central nervous system: Alert and oriented. No focal neurological deficits. Extremities: Symmetric 5 x 5 power. Skin: No rashes, lesions or ulcers Psychiatry: Judgement and insight appear normal. Mood & affect appropriate.  Data Reviewed: I have personally reviewed following labs and imaging studies  CBC: Recent  Labs  Lab 12/20/20 1554 12/21/20 1055 12/23/20 0400 12/24/20 0842 12/25/20 0354  WBC 8.1 6.8 6.9 7.3 7.6  NEUTROABS 6.9  --   --  5.4  --   HGB 13.8 12.5 10.4* 10.9* 11.0*  HCT 43.5 38.2 32.4* 34.0* 34.9*  MCV 93.1 91.0 91.8 92.9 92.8  PLT 212 214 188 225 270     Basic Metabolic Panel: Recent Labs  Lab 12/21/20 1055 12/22/20 0435 12/23/20 0400 12/24/20 0842 12/25/20 0354  NA 137 133* 131* 135 131*  K 3.4* 3.7 3.7 4.1 4.2  CL 100 99 102 104 99  CO2 26 27 24 25 25   GLUCOSE 103* 95 124* 99 111*  BUN 27* 24* 21 26* 34*  CREATININE 0.96 0.91 1.06* 0.95 0.79  CALCIUM 8.8* 8.3* 8.0* 8.3* 8.2*  MG 2.0  --   --  2.0 1.9  PHOS 2.9  --   --   --   --      GFR: Estimated Creatinine Clearance: 39.7 mL/min (by C-G formula based on SCr of 0.79 mg/dL).  Liver Function Tests: Recent Labs  Lab 12/20/20 1554 12/21/20 1055  AST 33 22  ALT 19 17  ALKPHOS 100 82  BILITOT 0.7 0.5  PROT 7.6 6.7  ALBUMIN 3.7 3.1*     CBG: No results for input(s): GLUCAP in the last 168 hours.   Recent Results (  from the past 240 hour(s))  Resp Panel by RT-PCR (Flu A&B, Covid) Nasopharyngeal Swab     Status: None   Collection Time: 12/21/20 10:54 AM   Specimen: Nasopharyngeal Swab; Nasopharyngeal(NP) swabs in vial transport medium  Result Value Ref Range Status   SARS Coronavirus 2 by RT PCR NEGATIVE NEGATIVE Final    Comment: (NOTE) SARS-CoV-2 target nucleic acids are NOT DETECTED.  The SARS-CoV-2 RNA is generally detectable in upper respiratory specimens during the acute phase of infection. The lowest concentration of SARS-CoV-2 viral copies this assay can detect is 138 copies/mL. A negative result does not preclude SARS-Cov-2 infection and should not be used as the sole basis for treatment or other patient management decisions. A negative result may occur with  improper specimen collection/handling, submission of specimen other than nasopharyngeal swab, presence of viral mutation(s)  within the areas targeted by this assay, and inadequate number of viral copies(<138 copies/mL). A negative result must be combined with clinical observations, patient history, and epidemiological information. The expected result is Negative.  Fact Sheet for Patients:  EntrepreneurPulse.com.au  Fact Sheet for Healthcare Providers:  IncredibleEmployment.be  This test is no t yet approved or cleared by the [place] FDA and  has been authorized for detection and/or diagnosis of SARS-CoV-2 by FDA under an Emergency Use Authorization (EUA). This EUA will remain  in effect (meaning this test can be used) for the duration of the COVID-19 declaration under Section 564(b)(1) of the Act, 21 U.S.C.section 360bbb-3(b)(1), unless the authorization is terminated  or revoked sooner.       Influenza A by PCR NEGATIVE NEGATIVE Final   Influenza B by PCR NEGATIVE NEGATIVE Final    Comment: (NOTE) The Xpert Xpress SARS-CoV-2/FLU/RSV plus assay is intended as an aid in the diagnosis of influenza from Nasopharyngeal swab specimens and should not be used as a sole basis for treatment. Nasal washings and aspirates are unacceptable for Xpert Xpress SARS-CoV-2/FLU/RSV testing.  Fact Sheet for Patients: EntrepreneurPulse.com.au  Fact Sheet for Healthcare Providers: IncredibleEmployment.be  This test is not yet approved or cleared by the [place] FDA and has been authorized for detection and/or diagnosis of SARS-CoV-2 by FDA under an Emergency Use Authorization (EUA). This EUA will remain in effect (meaning this test can be used) for the duration of the COVID-19 declaration under Section 564(b)(1) of the Act, 21 U.S.C. section 360bbb-3(b)(1), unless the authorization is terminated or revoked.  Performed at [facility], [place] [address], [place], [place] [address]   Urine Culture     Status: Abnormal    Collection Time: 12/21/20 11:07 AM   Specimen: Urine, Clean Catch  Result Value Ref Range Status   Specimen Description   Final    URINE, CLEAN CATCH Performed at [facility], [place] [address] [place], [place] [address]    Special Requests   Final    NONE Performed at [facility], [place] [address], [place], [place] [address]    Culture >=100,000 COLONIES/mL ESCHERICHIA COLI (A)  Final   Report Status 12/23/2020 FINAL  Final   Organism ID, Bacteria ESCHERICHIA COLI (A)  Final      Susceptibility   Escherichia coli - MIC*    AMPICILLIN >=32 RESISTANT Resistant     CEFAZOLIN <=4 SENSITIVE Sensitive     CEFEPIME <=0.12 SENSITIVE Sensitive     CEFTRIAXONE <=0.25 SENSITIVE Sensitive     CIPROFLOXACIN <=0.25 SENSITIVE Sensitive     GENTAMICIN <=1 SENSITIVE Sensitive  IMIPENEM <=0.25 SENSITIVE Sensitive     NITROFURANTOIN 32 SENSITIVE Sensitive     TRIMETH/SULFA >=320 RESISTANT Resistant     AMPICILLIN/SULBACTAM >=32 RESISTANT Resistant     PIP/TAZO <=4 SENSITIVE Sensitive     * >=100,000 COLONIES/mL ESCHERICHIA COLI  Gastrointestinal Panel by PCR , Stool     Status: None   Collection Time: 12/21/20  4:19 PM   Specimen: Stool  Result Value Ref Range Status   Campylobacter species NOT DETECTED NOT DETECTED Final   Plesimonas shigelloides NOT DETECTED NOT DETECTED Final   Salmonella species NOT DETECTED NOT DETECTED Final   Yersinia enterocolitica NOT DETECTED NOT DETECTED Final   Vibrio species NOT DETECTED NOT DETECTED Final   Vibrio cholerae NOT DETECTED NOT DETECTED Final   Enteroaggregative E coli (EAEC) NOT DETECTED NOT DETECTED Final   Enteropathogenic E coli (EPEC) NOT DETECTED NOT DETECTED Final   Enterotoxigenic E coli (ETEC) NOT DETECTED NOT DETECTED Final   Shiga like toxin producing E coli (STEC) NOT DETECTED NOT DETECTED Final   Shigella/Enteroinvasive E coli (EIEC) NOT DETECTED NOT DETECTED Final   Cryptosporidium NOT DETECTED  NOT DETECTED Final   Cyclospora cayetanensis NOT DETECTED NOT DETECTED Final   Entamoeba histolytica NOT DETECTED NOT DETECTED Final   Giardia lamblia NOT DETECTED NOT DETECTED Final   Adenovirus F40/41 NOT DETECTED NOT DETECTED Final   Astrovirus NOT DETECTED NOT DETECTED Final   Norovirus GI/GII NOT DETECTED NOT DETECTED Final   Rotavirus A NOT DETECTED NOT DETECTED Final   Sapovirus (I, II, IV, and V) NOT DETECTED NOT DETECTED Final    Comment: Performed at [facility], [place] [place], [place] [address]  C Difficile Quick Screen w PCR reflex     Status: None   Collection Time: 12/21/20  4:19 PM   Specimen: Stool  Result Value Ref Range Status   C Diff antigen NEGATIVE NEGATIVE Final   C Diff toxin NEGATIVE NEGATIVE Final   C Diff interpretation No C. difficile detected.  Final    Comment: Performed at [facility], [place] [address] [place], [place] [address]          Radiology Studies: No results found.      Scheduled Meds:  (feeding supplement) PROSource Plus  30 mL Oral BID BM   apixaban  2.5 mg Oral BID   carvedilol  12.5 mg Oral BID   cefadroxil  500 mg Oral BID   feeding supplement  1 Container Oral BID BM   losartan  12.5 mg Oral Daily   multivitamin with minerals  1 tablet Oral Daily   Opium  0.6 mL Oral TID AC & HS   Continuous Infusions:  lactated ringers Stopped (12/21/20 1626)     LOS: 3 days    Time spent: 35 minutes    Irine Seal, MD [facility]   To contact the attending provider between 7A-7P or the covering provider during after hours 7P-7A, please log into the web site [email or web address] and access using universal [place] password for that web site. If you do not have the password, please call the hospital operator.  12/25/2020, 10:32 AM

## 2020-12-26 DIAGNOSIS — N390 Urinary tract infection, site not specified: Secondary | ICD-10-CM | POA: Diagnosis not present

## 2020-12-26 DIAGNOSIS — R197 Diarrhea, unspecified: Secondary | ICD-10-CM | POA: Diagnosis not present

## 2020-12-26 DIAGNOSIS — I5021 Acute systolic (congestive) heart failure: Secondary | ICD-10-CM | POA: Diagnosis not present

## 2020-12-26 DIAGNOSIS — I10 Essential (primary) hypertension: Secondary | ICD-10-CM | POA: Diagnosis not present

## 2020-12-26 LAB — BASIC METABOLIC PANEL
Anion gap: 6 (ref 5–15)
BUN: 37 mg/dL — ABNORMAL HIGH (ref 8–23)
CO2: 26 mmol/L (ref 22–32)
Calcium: 8.2 mg/dL — ABNORMAL LOW (ref 8.9–10.3)
Chloride: 100 mmol/L (ref 98–111)
Creatinine, Ser: 0.96 mg/dL (ref 0.44–1.00)
GFR, Estimated: 57 mL/min — ABNORMAL LOW (ref >60)
Glucose, Bld: 107 mg/dL — ABNORMAL HIGH (ref 70–99)
Potassium: 4.3 mmol/L (ref 3.5–5.1)
Sodium: 132 mmol/L — ABNORMAL LOW (ref 135–145)

## 2020-12-26 LAB — MAGNESIUM: Magnesium: 1.9 mg/dL (ref 1.7–2.4)

## 2020-12-26 MED ORDER — MAGNESIUM OXIDE -MG SUPPLEMENT 400 (240 MG) MG PO TABS
400.0000 mg | ORAL_TABLET | Freq: Two times a day (BID) | ORAL | Status: AC
  Administered 2020-12-26 – 2020-12-28 (×6): 400 mg via ORAL
  Filled 2020-12-26 (×6): qty 1

## 2020-12-26 NOTE — Plan of Care (Signed)
  Problem: Clinical Measurements: Goal: Cardiovascular complication will be avoided Outcome: Progressing   

## 2020-12-26 NOTE — Progress Notes (Signed)
PROGRESS NOTE    [name]  ZOX:[id number] DOB: Sep 09, 1930 DOA: 12/20/2020 PCP: Lajean Manes, MD   Chief Complaint  Patient presents with   Diarrhea    Brief Narrative: [name], 87 y.o. female with PMH of anemia, osteoarthritis, cancer, chronic diarrhea, COPD, depression, GERD, goiter, headache, hypertension, unspecified peripheral neuropathy, osteoporosis, history of UTI presented from assisted living facility with complaint of generalized weakness, diarrhea x3 days. In the ED vitals fairly stable, although had episode of tachycardia in 130s to 140s, given IV fluids, labs showed UA consistent with UTI, mild hypokalemia creatinine 1.2. Patient is kept on IV fluids and antibiotics and admitted   Assessment & Plan:   Principal Problem:   Acute lower UTI (urinary tract infection) Active Problems:   Hypertension   General weakness   Hypokalemia   Volume depletion   UTI (urinary tract infection)   Protein-calorie malnutrition, severe   Diarrhea   Paroxysmal atrial fibrillation (HCC)   CHF NYHA class I (no symptoms from ordinary activities), acute, systolic (HCC)  #1 E. coli UTI -Afebrile.  Normal white count. -Improving clinically. -Urine cultures with E. coli UTI resistant to ampicillin, Bactrim, Zosyn.  Sensitive to the cephalosporins, fluoroquinolones, gentamicin, imipenem, Bactrim. -Was on IV Rocephin and has been transitioned to cefadroxil to complete course of antibiotic treatment.  2.  A flutter/A. fib with RVR CHA2DS2VASC score 4 -Patient noted to have converted to normal sinus rhythm this morning after being on Cardizem drip.. -2D echo with EF of 40 to 45%, global hypokinesis moderate LVH.  Right ventricular systolic function normal.  Left atrial size mildly dilated -Patient being followed by cardiology, amlodipine discontinued and patient on Coreg of 6.25 mg twice daily for rate control and dose adjusted to 12.5 mg twice daily for better blood pressure  control. -Continue low-dose Eliquis as recommended per cardiology for anticoagulation. -Cardiology recommending a 30-day heart monitor to evaluate for recurrence of A. fib/flutter on discharge. -Patient will need to be closely monitored for falls and anemia. -Per cardiology.  3.  Newly recognized CHF -2D echo with EF of 40 to 45% with global hypokinesis, LVH, mild MR, moderate TR. -Patient clinically does not look volume overloaded on examination and has been having chronic intermittent diarrhea. -Continue increased dose of Coreg, losartan.  -Will need outpatient follow-up with cardiology with repeat limited 2D echo in 2 to 3 months.  4.  Hypertension -Norvasc discontinued. -Coreg increased to 12.5 mg twice daily per cardiology recommendations and patient started on losartan 12.5 mg daily. -Outpatient follow-up  5.  Chronic diarrhea -The patient has had diarrhea since her colon surgery and opium tincture usually helps manage it. -Stool studies are negative for C. difficile PCR. -Patient states usually takes a opium tincture before meals which she was not initially getting early on in the hospitalization and currently placed on home regimen of before meals.   -Continue opium tincture.   -Outpatient follow-up.  6.  Generalized weakness/debility/deconditioning -Patient seen by PT OT who are recommending SNF. -TOC consulted for SNF placement.  7.  Hypokalemia -Repleted. -Potassium of 4.3 today, magnesium at 1.9.   8.  Severe protein calorie malnutrition -Continue nutritional supplementation  9.  Pressure injury stage II on the coccyx, POA Pressure Injury 12/20/18 Coccyx Medial Stage II -  Partial thickness loss of dermis presenting as a shallow open ulcer with a red, pink wound bed without slough. pink, open skin, non-blanchable (Active)  12/20/18 1415  Location: Coccyx  Location Orientation: Medial  Staging:  Stage II -  Partial thickness loss of dermis presenting as a shallow  open ulcer with a red, pink wound bed without slough.  Wound Description (Comments): pink, open skin, non-blanchable  Present on Admission: Yes         DVT prophylaxis: Eliquis Code Status: Full Family Communication: Updated patient.  No family at bedside. Disposition:   Status is: Inpatient  Remains inpatient appropriate because: Ongoing management of patient's tachycardia, UTI, generalized weakness.  Unsafe disposition.       Consultants:  Cardiology: Dr. Sallyanne Kuster 12/23/2018  Procedures:  2D echo 12/22/2020  Antimicrobials:  IV Rocephin 12/21/2020>>>>> 12/23/2020 Cefadroxil 12/24/2020>>>>> 12/28/2020   Subjective: Patient sleeping heavily.  Arousable.  Denies any chest pain or shortness of breath.     Objective: Vitals:   12/25/20 2000 12/25/20 2340 12/26/20 0000 12/26/20 0614  BP:  (!) 176/76  130/66  Pulse:  82  65  Resp: 14 20 19 18   Temp:  98.6 F (37 C)  98 F (36.7 C)  TempSrc:  Oral  Oral  SpO2:  98%  95%  Weight:      Height:        Intake/Output Summary (Last 24 hours) at 12/26/2020 1337 Last data filed at 12/26/2020 0600 Gross per 24 hour  Intake 240 ml  Output 1150 ml  Net -910 ml    Filed Weights   12/23/20 1513  Weight: 51.7 kg    Examination:  General exam: : NAD Respiratory system: CTA B anterior lung fields.  No wheezes, no rhonchi.  Speaking in full sentences.  Normal respiratory effort. Cardiovascular system: Regular rate and rhythm no murmurs rubs or gallops.  No JVD.  No lower extremity edema.  Gastrointestinal system: Abdomen soft, nontender, nondistended, positive bowel sounds.  No rebound.  No guarding. Central nervous system: Alert and oriented. No focal neurological deficits. Extremities: Symmetric 5 x 5 power. Skin: No rashes, lesions or ulcers Psychiatry: Judgement and insight appear normal. Mood & affect appropriate.  Data Reviewed: I have personally reviewed following labs and imaging studies  CBC: Recent  Labs  Lab 12/20/20 1554 12/21/20 1055 12/23/20 0400 12/24/20 0842 12/25/20 0354  WBC 8.1 6.8 6.9 7.3 7.6  NEUTROABS 6.9  --   --  5.4  --   HGB 13.8 12.5 10.4* 10.9* 11.0*  HCT 43.5 38.2 32.4* 34.0* 34.9*  MCV 93.1 91.0 91.8 92.9 92.8  PLT 212 214 188 225 270     Basic Metabolic Panel: Recent Labs  Lab 12/21/20 1055 12/22/20 0435 12/23/20 0400 12/24/20 0842 12/25/20 0354 12/26/20 0338  NA 137 133* 131* 135 131* 132*  K 3.4* 3.7 3.7 4.1 4.2 4.3  CL 100 99 102 104 99 100  CO2 26 27 24 25 25 26   GLUCOSE 103* 95 124* 99 111* 107*  BUN 27* 24* 21 26* 34* 37*  CREATININE 0.96 0.91 1.06* 0.95 0.79 0.96  CALCIUM 8.8* 8.3* 8.0* 8.3* 8.2* 8.2*  MG 2.0  --   --  2.0 1.9 1.9  PHOS 2.9  --   --   --   --   --      GFR: Estimated Creatinine Clearance: 33.1 mL/min (by C-G formula based on SCr of 0.96 mg/dL).  Liver Function Tests: Recent Labs  Lab 12/20/20 1554 12/21/20 1055  AST 33 22  ALT 19 17  ALKPHOS 100 82  BILITOT 0.7 0.5  PROT 7.6 6.7  ALBUMIN 3.7 3.1*     CBG: No results for input(s): GLUCAP  in the last 168 hours.   Recent Results (from the past 240 hour(s))  Resp Panel by RT-PCR (Flu A&B, Covid) Nasopharyngeal Swab     Status: None   Collection Time: 12/21/20 10:54 AM   Specimen: Nasopharyngeal Swab; Nasopharyngeal(NP) swabs in vial transport medium  Result Value Ref Range Status   SARS Coronavirus 2 by RT PCR NEGATIVE NEGATIVE Final    Comment: (NOTE) SARS-CoV-2 target nucleic acids are NOT DETECTED.  The SARS-CoV-2 RNA is generally detectable in upper respiratory specimens during the acute phase of infection. The lowest concentration of SARS-CoV-2 viral copies this assay can detect is 138 copies/mL. A negative result does not preclude SARS-Cov-2 infection and should not be used as the sole basis for treatment or other patient management decisions. A negative result may occur with  improper specimen collection/handling, submission of specimen  other than nasopharyngeal swab, presence of viral mutation(s) within the areas targeted by this assay, and inadequate number of viral copies(<138 copies/mL). A negative result must be combined with clinical observations, patient history, and epidemiological information. The expected result is Negative.  Fact Sheet for Patients:  EntrepreneurPulse.com.au  Fact Sheet for Healthcare Providers:  IncredibleEmployment.be  This test is no t yet approved or cleared by the [place] FDA and  has been authorized for detection and/or diagnosis of SARS-CoV-2 by FDA under an Emergency Use Authorization (EUA). This EUA will remain  in effect (meaning this test can be used) for the duration of the COVID-19 declaration under Section 564(b)(1) of the Act, 21 U.S.C.section 360bbb-3(b)(1), unless the authorization is terminated  or revoked sooner.       Influenza A by PCR NEGATIVE NEGATIVE Final   Influenza B by PCR NEGATIVE NEGATIVE Final    Comment: (NOTE) The Xpert Xpress SARS-CoV-2/FLU/RSV plus assay is intended as an aid in the diagnosis of influenza from Nasopharyngeal swab specimens and should not be used as a sole basis for treatment. Nasal washings and aspirates are unacceptable for Xpert Xpress SARS-CoV-2/FLU/RSV testing.  Fact Sheet for Patients: EntrepreneurPulse.com.au  Fact Sheet for Healthcare Providers: IncredibleEmployment.be  This test is not yet approved or cleared by the [place] FDA and has been authorized for detection and/or diagnosis of SARS-CoV-2 by FDA under an Emergency Use Authorization (EUA). This EUA will remain in effect (meaning this test can be used) for the duration of the COVID-19 declaration under Section 564(b)(1) of the Act, 21 U.S.C. section 360bbb-3(b)(1), unless the authorization is terminated or revoked.  Performed at [facility], [place] [address], [place], [place] [address]   Urine Culture     Status: Abnormal   Collection Time: 12/21/20 11:07 AM   Specimen: Urine, Clean Catch  Result Value Ref Range Status   Specimen Description   Final    URINE, CLEAN CATCH Performed at [facility], [place] [address], [place], [place] [address]    Special Requests   Final    NONE Performed at [facility], [place] [address] [place], [place] [address]    Culture >=100,000 COLONIES/mL ESCHERICHIA COLI (A)  Final   Report Status 12/23/2020 FINAL  Final   Organism ID, Bacteria ESCHERICHIA COLI (A)  Final      Susceptibility   Escherichia coli - MIC*    AMPICILLIN >=32 RESISTANT Resistant     CEFAZOLIN <=4 SENSITIVE Sensitive     CEFEPIME <=0.12 SENSITIVE Sensitive     CEFTRIAXONE <=0.25 SENSITIVE Sensitive     CIPROFLOXACIN <=0.25 SENSITIVE Sensitive  GENTAMICIN <=1 SENSITIVE Sensitive     IMIPENEM <=0.25 SENSITIVE Sensitive     NITROFURANTOIN 32 SENSITIVE Sensitive     TRIMETH/SULFA >=320 RESISTANT Resistant     AMPICILLIN/SULBACTAM >=32 RESISTANT Resistant     PIP/TAZO <=4 SENSITIVE Sensitive     * >=100,000 COLONIES/mL ESCHERICHIA COLI  Gastrointestinal Panel by PCR , Stool     Status: None   Collection Time: 12/21/20  4:19 PM   Specimen: Stool  Result Value Ref Range Status   Campylobacter species NOT DETECTED NOT DETECTED Final   Plesimonas shigelloides NOT DETECTED NOT DETECTED Final   Salmonella species NOT DETECTED NOT DETECTED Final   Yersinia enterocolitica NOT DETECTED NOT DETECTED Final   Vibrio species NOT DETECTED NOT DETECTED Final   Vibrio cholerae NOT DETECTED NOT DETECTED Final   Enteroaggregative E coli (EAEC) NOT DETECTED NOT DETECTED Final   Enteropathogenic E coli (EPEC) NOT DETECTED NOT DETECTED Final   Enterotoxigenic E coli (ETEC) NOT DETECTED NOT DETECTED Final   Shiga like toxin producing E coli (STEC) NOT DETECTED NOT DETECTED Final   Shigella/Enteroinvasive E coli  (EIEC) NOT DETECTED NOT DETECTED Final   Cryptosporidium NOT DETECTED NOT DETECTED Final   Cyclospora cayetanensis NOT DETECTED NOT DETECTED Final   Entamoeba histolytica NOT DETECTED NOT DETECTED Final   Giardia lamblia NOT DETECTED NOT DETECTED Final   Adenovirus F40/41 NOT DETECTED NOT DETECTED Final   Astrovirus NOT DETECTED NOT DETECTED Final   Norovirus GI/GII NOT DETECTED NOT DETECTED Final   Rotavirus A NOT DETECTED NOT DETECTED Final   Sapovirus (I, II, IV, and V) NOT DETECTED NOT DETECTED Final    Comment: Performed at [facility], [place] [place], [place] [address]  C Difficile Quick Screen w PCR reflex     Status: None   Collection Time: 12/21/20  4:19 PM   Specimen: Stool  Result Value Ref Range Status   C Diff antigen NEGATIVE NEGATIVE Final   C Diff toxin NEGATIVE NEGATIVE Final   C Diff interpretation No C. difficile detected.  Final    Comment: Performed at [facility], [place] [address] [place],  [address]          Radiology Studies: No results found.      Scheduled Meds:  (feeding supplement) PROSource Plus  30 mL Oral BID BM   apixaban  2.5 mg Oral BID   carvedilol  12.5 mg Oral BID   cefadroxil  500 mg Oral BID   feeding supplement  1 Container Oral BID BM   losartan  12.5 mg Oral Daily   magnesium oxide  400 mg Oral BID   multivitamin with minerals  1 tablet Oral Daily   Opium  0.6 mL Oral TID AC & HS   Continuous Infusions:     LOS: 4 days    Time spent: 35 minutes    Irine Seal, MD [facility]   To contact the attending provider between 7A-7P or the covering provider during after hours 7P-7A, please log into the web site [email or web address] and access using universal [place] password for that web site. If you do not have the password, please call the hospital operator.  12/26/2020, 1:37 PM

## 2020-12-27 DIAGNOSIS — R197 Diarrhea, unspecified: Secondary | ICD-10-CM | POA: Diagnosis not present

## 2020-12-27 DIAGNOSIS — N390 Urinary tract infection, site not specified: Secondary | ICD-10-CM | POA: Diagnosis not present

## 2020-12-27 DIAGNOSIS — I5021 Acute systolic (congestive) heart failure: Secondary | ICD-10-CM | POA: Diagnosis not present

## 2020-12-27 DIAGNOSIS — I10 Essential (primary) hypertension: Secondary | ICD-10-CM | POA: Diagnosis not present

## 2020-12-27 LAB — CBC
HCT: 31.4 % — ABNORMAL LOW (ref 36.0–46.0)
Hemoglobin: 9.9 g/dL — ABNORMAL LOW (ref 12.0–15.0)
MCH: 29.1 pg (ref 26.0–34.0)
MCHC: 31.5 g/dL (ref 30.0–36.0)
MCV: 92.4 fL (ref 80.0–100.0)
Platelets: 321 10*3/uL (ref 150–400)
RBC: 3.4 MIL/uL — ABNORMAL LOW (ref 3.87–5.11)
RDW: 13.8 % (ref 11.5–15.5)
WBC: 7.7 10*3/uL (ref 4.0–10.5)
nRBC: 0 % (ref 0.0–0.2)

## 2020-12-27 LAB — BASIC METABOLIC PANEL
Anion gap: 10 (ref 5–15)
BUN: 48 mg/dL — ABNORMAL HIGH (ref 8–23)
CO2: 27 mmol/L (ref 22–32)
Calcium: 8.5 mg/dL — ABNORMAL LOW (ref 8.9–10.3)
Chloride: 97 mmol/L — ABNORMAL LOW (ref 98–111)
Creatinine, Ser: 1.21 mg/dL — ABNORMAL HIGH (ref 0.44–1.00)
GFR, Estimated: 43 mL/min — ABNORMAL LOW (ref >60)
Glucose, Bld: 129 mg/dL — ABNORMAL HIGH (ref 70–99)
Potassium: 4.3 mmol/L (ref 3.5–5.1)
Sodium: 134 mmol/L — ABNORMAL LOW (ref 135–145)

## 2020-12-27 LAB — MAGNESIUM: Magnesium: 2.1 mg/dL (ref 1.7–2.4)

## 2020-12-27 LAB — RESP PANEL BY RT-PCR (FLU A&B, COVID) ARPGX2
Influenza A by PCR: NEGATIVE
Influenza B by PCR: NEGATIVE
SARS Coronavirus 2 by RT PCR: NEGATIVE

## 2020-12-27 MED ORDER — SODIUM CHLORIDE 0.9 % IV BOLUS
250.0000 mL | Freq: Once | INTRAVENOUS | Status: AC
  Administered 2020-12-27: 250 mL via INTRAVENOUS

## 2020-12-27 MED ORDER — GUAIFENESIN ER 600 MG PO TB12
600.0000 mg | ORAL_TABLET | Freq: Two times a day (BID) | ORAL | Status: DC
  Administered 2020-12-27 – 2020-12-30 (×8): 600 mg via ORAL
  Filled 2020-12-27 (×8): qty 1

## 2020-12-27 MED ORDER — SODIUM CHLORIDE 0.9 % IV SOLN: INTRAVENOUS | Status: DC

## 2020-12-27 MED ORDER — FLUTICASONE PROPIONATE 50 MCG/ACT NA SUSP
2.0000 | Freq: Every day | NASAL | Status: DC
  Administered 2020-12-27 – 2020-12-30 (×4): 2 via NASAL
  Filled 2020-12-27: qty 16

## 2020-12-27 MED ORDER — LORATADINE 10 MG PO TABS
10.0000 mg | ORAL_TABLET | Freq: Every day | ORAL | Status: DC
  Administered 2020-12-27 – 2020-12-30 (×4): 10 mg via ORAL
  Filled 2020-12-27 (×4): qty 1

## 2020-12-27 NOTE — Progress Notes (Signed)
PROGRESS NOTE    [name]  NOM:[id number] DOB: 11/18/1930 DOA: 12/20/2020 PCP: Lajean Manes, MD   Chief Complaint  Patient presents with   Diarrhea    Brief Narrative: [name], 87 y.o. female with PMH of anemia, osteoarthritis, cancer, chronic diarrhea, COPD, depression, GERD, goiter, headache, hypertension, unspecified peripheral neuropathy, osteoporosis, history of UTI presented from assisted living facility with complaint of generalized weakness, diarrhea x3 days. In the ED vitals fairly stable, although had episode of tachycardia in 130s to 140s, given IV fluids, labs showed UA consistent with UTI, mild hypokalemia creatinine 1.2. Patient is kept on IV fluids and antibiotics and admitted   Assessment & Plan:   Principal Problem:   Acute lower UTI (urinary tract infection) Active Problems:   Hypertension   General weakness   Hypokalemia   Volume depletion   UTI (urinary tract infection)   Protein-calorie malnutrition, severe   Diarrhea   Paroxysmal atrial fibrillation (HCC)   CHF NYHA class I (no symptoms from ordinary activities), acute, systolic (HCC)  #1 E. coli UTI -Afebrile.  Normal white count. -Improving clinically. -Urine cultures with E. coli UTI resistant to ampicillin, Bactrim, Zosyn.  Sensitive to the cephalosporins, fluoroquinolones, gentamicin, imipenem, Bactrim. -Was on IV Rocephin and has been transitioned to cefadroxil to complete course of antibiotic treatment.  2.  A flutter/A. fib with RVR CHA2DS2VASC score 4 -Patient noted to have converted to normal sinus rhythm this morning after being on Cardizem drip.. -2D echo with EF of 40 to 45%, global hypokinesis moderate LVH.  Right ventricular systolic function normal.  Left atrial size mildly dilated -Patient being followed by cardiology, amlodipine discontinued and patient on Coreg of 6.25 mg twice daily for rate control and dose adjusted to 12.5 mg twice daily for better blood pressure  control. -Continue low-dose Eliquis as recommended per cardiology for anticoagulation. -Cardiology recommending a 30-day heart monitor to evaluate for recurrence of A. fib/flutter on discharge. -Patient will need to be closely monitored for falls and anemia. -Per cardiology.  3.  Newly recognized CHF -2D echo with EF of 40 to 45% with global hypokinesis, LVH, mild MR, moderate TR. -Patient clinically does not look volume overloaded on examination and has been having chronic intermittent diarrhea. -Continue current regimen of Coreg, losartan.   -Outpatient follow-up with cardiology for repeat limited 2D echo 2-3 months.   4.  Hypertension -Norvasc discontinued. -Coreg increased to 12.5 mg twice daily per cardiology recommendations and patient started on losartan 12.5 mg daily. -Outpatient follow-up  5.  Chronic diarrhea -The patient has had diarrhea since her colon surgery and opium tincture usually helps manage it. -Stool studies are negative for C. difficile PCR. -Patient states usually takes a opium tincture before meals which she was not initially getting early on in the hospitalization and currently placed on home regimen of before meals.   -Continue opium tincture.   -Outpatient follow-up.  6.  Generalized weakness/debility/deconditioning -Patient seen by PT OT who are recommending SNF. -TOC consulted for SNF placement.  7.  Hypokalemia -Repleted. -Potassium at 4.3.  Magnesium at 2.1.  8.  Severe protein calorie malnutrition -Nutritional supplementation.  9.  Pressure injury stage II on the coccyx, POA Pressure Injury 12/20/18 Coccyx Medial Stage II -  Partial thickness loss of dermis presenting as a shallow open ulcer with a red, pink wound bed without slough. pink, open skin, non-blanchable (Active)  12/20/18 1415  Location: Coccyx  Location Orientation: Medial  Staging: Stage II -  Partial  thickness loss of dermis presenting as a shallow open ulcer with a red, pink  wound bed without slough.  Wound Description (Comments): pink, open skin, non-blanchable  Present on Admission: Yes         DVT prophylaxis: Eliquis Code Status: Full Family Communication: Updated patient.  Updated son at bedside.  Disposition:   Status is: Inpatient  Remains inpatient appropriate because: Ongoing management of patient's tachycardia, UTI, generalized weakness.  Unsafe disposition.       Consultants:  Cardiology: Dr. Sallyanne Kuster 12/23/2018  Procedures:  2D echo 12/22/2020  Antimicrobials:  IV Rocephin 12/21/2020>>>>> 12/23/2020 Cefadroxil 12/24/2020>>>>> 12/28/2020   Subjective: Sitting up eating lunch.  Denies any chest pain.  No shortness of breath.  No abdominal pain.  Complain of some nasal congestion.  Son at bedside.  Son asking whether patient can go to SNF that is part of her independent living at [facility].   Objective: Vitals:   12/26/20 1356 12/26/20 2000 12/27/20 0515 12/27/20 0944  BP: (!) 119/58 (!) 157/78 (!) 86/45 (!) 179/77  Pulse: 66 73 73 77  Resp: 18 19  14   Temp: 98.4 F (36.9 C) 99.6 F (37.6 C) 98.3 F (36.8 C) 97.8 F (36.6 C)  TempSrc: Oral Oral Oral Oral  SpO2: 95% 97% 97% 93%  Weight:      Height:        Intake/Output Summary (Last 24 hours) at 12/27/2020 1322 Last data filed at 12/27/2020 1045 Gross per 24 hour  Intake 825.09 ml  Output 650 ml  Net 175.09 ml    Filed Weights   12/23/20 1513  Weight: 51.7 kg    Examination: General exam: : NAD Respiratory system: CTA B.  No wheezes, no rhonchi.  Speaking in full sentences.  Normal respiratory effort. Cardiovascular system: Regular rate and rhythm no murmurs rubs or gallops.  No JVD.  No lower extremity edema.  Gastrointestinal system: Abdomen soft, nontender, nondistended, positive bowel sounds.  No rebound.  No guarding. Central nervous system: Alert and oriented. No focal neurological deficits. Extremities: Symmetric 5 x 5 power. Skin: No rashes,  lesions or ulcers Psychiatry: Judgement and insight appear normal. Mood & affect appropriate.  Data Reviewed: I have personally reviewed following labs and imaging studies  CBC: Recent Labs  Lab 12/20/20 1554 12/21/20 1055 12/23/20 0400 12/24/20 0842 12/25/20 0354 12/27/20 0357  WBC 8.1 6.8 6.9 7.3 7.6 7.7  NEUTROABS 6.9  --   --  5.4  --   --   HGB 13.8 12.5 10.4* 10.9* 11.0* 9.9*  HCT 43.5 38.2 32.4* 34.0* 34.9* 31.4*  MCV 93.1 91.0 91.8 92.9 92.8 92.4  PLT 212 214 188 225 270 321     Basic Metabolic Panel: Recent Labs  Lab 12/21/20 1055 12/22/20 0435 12/23/20 0400 12/24/20 0842 12/25/20 0354 12/26/20 0338 12/27/20 0357  NA 137   < > 131* 135 131* 132* 134*  K 3.4*   < > 3.7 4.1 4.2 4.3 4.3  CL 100   < > 102 104 99 100 97*  CO2 26   < > 24 25 25 26 27   GLUCOSE 103*   < > 124* 99 111* 107* 129*  BUN 27*   < > 21 26* 34* 37* 48*  CREATININE 0.96   < > 1.06* 0.95 0.79 0.96 1.21*  CALCIUM 8.8*   < > 8.0* 8.3* 8.2* 8.2* 8.5*  MG 2.0  --   --  2.0 1.9 1.9 2.1  PHOS 2.9  --   --   --   --   --   --    < > =  values in this interval not displayed.     GFR: Estimated Creatinine Clearance: 26.2 mL/min (A) (by C-G formula based on SCr of 1.21 mg/dL (H)).  Liver Function Tests: Recent Labs  Lab 12/20/20 1554 12/21/20 1055  AST 33 22  ALT 19 17  ALKPHOS 100 82  BILITOT 0.7 0.5  PROT 7.6 6.7  ALBUMIN 3.7 3.1*     CBG: No results for input(s): GLUCAP in the last 168 hours.   Recent Results (from the past 240 hour(s))  Resp Panel by RT-PCR (Flu A&B, Covid) Nasopharyngeal Swab     Status: None   Collection Time: 12/21/20 10:54 AM   Specimen: Nasopharyngeal Swab; Nasopharyngeal(NP) swabs in vial transport medium  Result Value Ref Range Status   SARS Coronavirus 2 by RT PCR NEGATIVE NEGATIVE Final    Comment: (NOTE) SARS-CoV-2 target nucleic acids are NOT DETECTED.  The SARS-CoV-2 RNA is generally detectable in upper respiratory specimens during the acute  phase of infection. The lowest concentration of SARS-CoV-2 viral copies this assay can detect is 138 copies/mL. A negative result does not preclude SARS-Cov-2 infection and should not be used as the sole basis for treatment or other patient management decisions. A negative result may occur with  improper specimen collection/handling, submission of specimen other than nasopharyngeal swab, presence of viral mutation(s) within the areas targeted by this assay, and inadequate number of viral copies(<138 copies/mL). A negative result must be combined with clinical observations, patient history, and epidemiological information. The expected result is Negative.  Fact Sheet for Patients:  EntrepreneurPulse.com.au  Fact Sheet for Healthcare Providers:  IncredibleEmployment.be  This test is no t yet approved or cleared by the [place] FDA and  has been authorized for detection and/or diagnosis of SARS-CoV-2 by FDA under an Emergency Use Authorization (EUA). This EUA will remain  in effect (meaning this test can be used) for the duration of the COVID-19 declaration under Section 564(b)(1) of the Act, 21 U.S.C.section 360bbb-3(b)(1), unless the authorization is terminated  or revoked sooner.       Influenza A by PCR NEGATIVE NEGATIVE Final   Influenza B by PCR NEGATIVE NEGATIVE Final    Comment: (NOTE) The Xpert Xpress SARS-CoV-2/FLU/RSV plus assay is intended as an aid in the diagnosis of influenza from Nasopharyngeal swab specimens and should not be used as a sole basis for treatment. Nasal washings and aspirates are unacceptable for Xpert Xpress SARS-CoV-2/FLU/RSV testing.  Fact Sheet for Patients: EntrepreneurPulse.com.au  Fact Sheet for Healthcare Providers: IncredibleEmployment.be  This test is not yet approved or cleared by the [place] FDA and has been authorized for detection and/or diagnosis of  SARS-CoV-2 by FDA under an Emergency Use Authorization (EUA). This EUA will remain in effect (meaning this test can be used) for the duration of the COVID-19 declaration under Section 564(b)(1) of the Act, 21 U.S.C. section 360bbb-3(b)(1), unless the authorization is terminated or revoked.  Performed at [facility], [place] [address], [place], [place] [address]   Urine Culture     Status: Abnormal   Collection Time: 12/21/20 11:07 AM   Specimen: Urine, Clean Catch  Result Value Ref Range Status   Specimen Description   Final    URINE, CLEAN CATCH Performed at [facility], [place] [address] [place], [place] [address]    Special Requests   Final    NONE Performed at [facility], [place] [address] [place], [place] [address]    Culture >=100,000 COLONIES/mL ESCHERICHIA COLI (  A)  Final   Report Status 12/23/2020 FINAL  Final   Organism ID, Bacteria ESCHERICHIA COLI (A)  Final      Susceptibility   Escherichia coli - MIC*    AMPICILLIN >=32 RESISTANT Resistant     CEFAZOLIN <=4 SENSITIVE Sensitive     CEFEPIME <=0.12 SENSITIVE Sensitive     CEFTRIAXONE <=0.25 SENSITIVE Sensitive     CIPROFLOXACIN <=0.25 SENSITIVE Sensitive     GENTAMICIN <=1 SENSITIVE Sensitive     IMIPENEM <=0.25 SENSITIVE Sensitive     NITROFURANTOIN 32 SENSITIVE Sensitive     TRIMETH/SULFA >=320 RESISTANT Resistant     AMPICILLIN/SULBACTAM >=32 RESISTANT Resistant     PIP/TAZO <=4 SENSITIVE Sensitive     * >=100,000 COLONIES/mL ESCHERICHIA COLI  Gastrointestinal Panel by PCR , Stool     Status: None   Collection Time: 12/21/20  4:19 PM   Specimen: Stool  Result Value Ref Range Status   Campylobacter species NOT DETECTED NOT DETECTED Final   Plesimonas shigelloides NOT DETECTED NOT DETECTED Final   Salmonella species NOT DETECTED NOT DETECTED Final   Yersinia enterocolitica NOT DETECTED NOT DETECTED Final   Vibrio species NOT DETECTED NOT DETECTED Final    Vibrio cholerae NOT DETECTED NOT DETECTED Final   Enteroaggregative E coli (EAEC) NOT DETECTED NOT DETECTED Final   Enteropathogenic E coli (EPEC) NOT DETECTED NOT DETECTED Final   Enterotoxigenic E coli (ETEC) NOT DETECTED NOT DETECTED Final   Shiga like toxin producing E coli (STEC) NOT DETECTED NOT DETECTED Final   Shigella/Enteroinvasive E coli (EIEC) NOT DETECTED NOT DETECTED Final   Cryptosporidium NOT DETECTED NOT DETECTED Final   Cyclospora cayetanensis NOT DETECTED NOT DETECTED Final   Entamoeba histolytica NOT DETECTED NOT DETECTED Final   Giardia lamblia NOT DETECTED NOT DETECTED Final   Adenovirus F40/41 NOT DETECTED NOT DETECTED Final   Astrovirus NOT DETECTED NOT DETECTED Final   Norovirus GI/GII NOT DETECTED NOT DETECTED Final   Rotavirus A NOT DETECTED NOT DETECTED Final   Sapovirus (I, II, IV, and V) NOT DETECTED NOT DETECTED Final    Comment: Performed at [facility], [address] [place], [place] [address]  C Difficile Quick Screen w PCR reflex     Status: None   Collection Time: 12/21/20  4:19 PM   Specimen: Stool  Result Value Ref Range Status   C Diff antigen NEGATIVE NEGATIVE Final   C Diff toxin NEGATIVE NEGATIVE Final   C Diff interpretation No C. difficile detected.  Final    Comment: Performed at [facility], [place] [address], [place], [place] [address]  Resp Panel by RT-PCR (Flu A&B, Covid) Nasopharyngeal Swab     Status: None   Collection Time: 12/27/20  5:50 AM   Specimen: Nasopharyngeal Swab; Nasopharyngeal(NP) swabs in vial transport medium  Result Value Ref Range Status   SARS Coronavirus 2 by RT PCR NEGATIVE NEGATIVE Final    Comment: (NOTE) SARS-CoV-2 target nucleic acids are NOT DETECTED.  The SARS-CoV-2 RNA is generally detectable in upper respiratory specimens during the acute phase of infection. The lowest concentration of SARS-CoV-2 viral copies this assay can detect is 138 copies/mL. A negative result does  not preclude SARS-Cov-2 infection and should not be used as the sole basis for treatment or other patient management decisions. A negative result may occur with  improper specimen collection/handling, submission of specimen other than nasopharyngeal swab, presence of viral mutation(s) within the areas targeted by this assay, and inadequate number of  viral copies(<138 copies/mL). A negative result must be combined with clinical observations, patient history, and epidemiological information. The expected result is Negative.  Fact Sheet for Patients:  EntrepreneurPulse.com.au  Fact Sheet for Healthcare Providers:  IncredibleEmployment.be  This test is no t yet approved or cleared by the [place] FDA and  has been authorized for detection and/or diagnosis of SARS-CoV-2 by FDA under an Emergency Use Authorization (EUA). This EUA will remain  in effect (meaning this test can be used) for the duration of the COVID-19 declaration under Section 564(b)(1) of the Act, 21 U.S.C.section 360bbb-3(b)(1), unless the authorization is terminated  or revoked sooner.       Influenza A by PCR NEGATIVE NEGATIVE Final   Influenza B by PCR NEGATIVE NEGATIVE Final    Comment: (NOTE) The Xpert Xpress SARS-CoV-2/FLU/RSV plus assay is intended as an aid in the diagnosis of influenza from Nasopharyngeal swab specimens and should not be used as a sole basis for treatment. Nasal washings and aspirates are unacceptable for Xpert Xpress SARS-CoV-2/FLU/RSV testing.  Fact Sheet for Patients: EntrepreneurPulse.com.au  Fact Sheet for Healthcare Providers: IncredibleEmployment.be  This test is not yet approved or cleared by the [place] FDA and has been authorized for detection and/or diagnosis of SARS-CoV-2 by FDA under an Emergency Use Authorization (EUA). This EUA will remain in effect (meaning this test can be used) for the  duration of the COVID-19 declaration under Section 564(b)(1) of the Act, 21 U.S.C. section 360bbb-3(b)(1), unless the authorization is terminated or revoked.  Performed at [facility], [place] [address], [place], [place] [address]           Radiology Studies: No results found.      Scheduled Meds:  (feeding supplement) PROSource Plus  30 mL Oral BID BM   apixaban  2.5 mg Oral BID   carvedilol  12.5 mg Oral BID   cefadroxil  500 mg Oral BID   feeding supplement  1 Container Oral BID BM   losartan  12.5 mg Oral Daily   magnesium oxide  400 mg Oral BID   multivitamin with minerals  1 tablet Oral Daily   Opium  0.6 mL Oral TID AC & HS   Continuous Infusions:     LOS: 5 days    Time spent: 35 minutes    Irine Seal, MD [facility]   To contact the attending provider between 7A-7P or the covering provider during after hours 7P-7A, please log into the web site [email or web address] and access using universal [place] password for that web site. If you do not have the password, please call the hospital operator.  12/27/2020, 1:22 PM

## 2020-12-27 NOTE — Progress Notes (Signed)
Chaplain responded to spiritual consult for advanced directive.  Chaplain visited while son Yvone Neu was present bedside. He explained that he had just driven up from [place] and was in the medical arena, as he did emergency work and donor work.  He said he did not know the location of his father "that's a long story".  The conversation proceeded with his concerns, and his mother was eating and listening. He wanted his mother to complete an AD to be "proactive." "I don't even know what medications she was taking before she was hospitalized."  Chaplain explained that the AD would not give him that power.  The HCPOA was only for end-of-life decision-making.  The chaplain explained parts A & B and left document for him to discuss with his mother. Chaplain said she would stop by later if able to help proceed with its completion if the patient was ready to do so. Rev. Tamsen Snider Pager [phone]

## 2020-12-27 NOTE — Progress Notes (Addendum)
Physical Therapy Treatment Patient Details Name: [name] MRN: [id number] DOB: 1930-06-25 Today's Date: 12/27/2020   History of Present Illness 87 year old Caucasian female from [facility] facility with a past medical history significant for but not limited to COVID-19, anemia, arthritis, chronic diarrhea from her prior Colon Surgery, COPD, GERD, hypertension and other comorbidities who presented to the hospital with diarrhea x 3 days, negative for C-Diff, found with UTI, tachycardia and new onset A-Fib.    PT Comments    Pt is requiring increased assistance on today compared to evaluation-nursing reports the same. She c/o feeling tired. She appears generally weak and fatigued. She walked ~15 feet x 2 with RW. She is at risk for falls. No family present during session. Continue to recommend SNF at this time.     Recommendations for follow up therapy are one component of a multi-disciplinary discharge planning process, led by the attending physician.  Recommendations may be updated based on patient status, additional functional criteria and insurance authorization.  Follow Up Recommendations  Skilled nursing-short term rehab (<3 hours/day)     Assistance Recommended at Discharge Frequent or constant Supervision/Assistance  Equipment Recommendations  None recommended by PT    Recommendations for Other Services       Precautions / Restrictions Precautions Precautions: Fall Precaution Comments: incontinent of both urine and bowels-needs brief when mobilizing Restrictions Weight Bearing Restrictions: No     Mobility  Bed Mobility Overal bed mobility: Needs Assistance Bed Mobility: Supine to Sit;Sit to Supine     Supine to sit: Mod assist Sit to supine: Mod assist   General bed mobility comments: Assist for trunk, bil LEs, and to scoot to EOB. Increased time. Cues required.    Transfers Overall transfer level: Needs assistance Equipment used: Rolling walker (2  wheels) Transfers: Sit to/from Stand Sit to Stand: Mod assist;+2 safety/equipment;From elevated surface           General transfer comment: Assist to rise, steady, control descent. Increased time. Cues required.    Ambulation/Gait Ambulation/Gait assistance: Min assist;+2 safety/equipment Gait Distance (Feet): 15 Feet (x2) Assistive device: Rolling walker (2 wheels) Gait Pattern/deviations: Step-through pattern;Decreased stride length;Trunk flexed     General Gait Details: Pt walked to/from bathroom with RW. Assist to stabilize and to maneuver RW intermittently. Slow gait. Pt appears geneally weak and fatigued with minimal activity.   Stairs             Wheelchair Mobility    Modified Rankin (Stroke Patients Only)       Balance Overall balance assessment: Needs assistance;History of Falls         Standing balance support: Bilateral upper extremity supported Standing balance-Leahy Scale: Poor                              Cognition Arousal/Alertness: Awake/alert Behavior During Therapy: WFL for tasks assessed/performed Overall Cognitive Status: No family/caregiver present to determine baseline cognitive functioning                                 General Comments: still with slow responses and movement        Exercises      General Comments        Pertinent Vitals/Pain Pain Assessment: No/denies pain    Home Living  Prior Function            PT Goals (current goals can now be found in the care plan section) Progress towards PT goals: Progressing toward goals    Frequency    Min 3X/week      PT Plan Current plan remains appropriate    Co-evaluation              AM-PAC PT "6 Clicks" Mobility   Outcome Measure  Help needed turning from your back to your side while in a flat bed without using bedrails?: A Lot Help needed moving from lying on your back to sitting on  the side of a flat bed without using bedrails?: A Lot Help needed moving to and from a bed to a chair (including a wheelchair)?: A Lot Help needed standing up from a chair using your arms (e.g., wheelchair or bedside chair)?: A Lot Help needed to walk in hospital room?: A Little Help needed climbing 3-5 steps with a railing? : A Lot 6 Click Score: 13    End of Session Equipment Utilized During Treatment: Gait belt Activity Tolerance: Patient limited by fatigue Patient left: in bed;with call bell/phone within reach;with bed alarm set   PT Visit Diagnosis: Muscle weakness (generalized) (M62.81);Difficulty in walking, not elsewhere classified (R26.2)     Time: 9983-3825 PT Time Calculation (min) (ACUTE ONLY): 27 min  Charges:  $Gait Training: 8-22 mins $Therapeutic Activity: 8-22 mins                        Doreatha Massed, PT Acute Rehabilitation  Office: [phone] Pager: [phone]

## 2020-12-28 ENCOUNTER — Inpatient Hospital Stay (HOSPITAL_COMMUNITY): Payer: Medicare Other

## 2020-12-28 DIAGNOSIS — R5383 Other fatigue: Secondary | ICD-10-CM

## 2020-12-28 DIAGNOSIS — J189 Pneumonia, unspecified organism: Secondary | ICD-10-CM

## 2020-12-28 LAB — BASIC METABOLIC PANEL
Anion gap: 8 (ref 5–15)
BUN: 39 mg/dL — ABNORMAL HIGH (ref 8–23)
CO2: 26 mmol/L (ref 22–32)
Calcium: 8.1 mg/dL — ABNORMAL LOW (ref 8.9–10.3)
Chloride: 102 mmol/L (ref 98–111)
Creatinine, Ser: 0.95 mg/dL (ref 0.44–1.00)
GFR, Estimated: 58 mL/min — ABNORMAL LOW (ref >60)
Glucose, Bld: 146 mg/dL — ABNORMAL HIGH (ref 70–99)
Potassium: 3.8 mmol/L (ref 3.5–5.1)
Sodium: 136 mmol/L (ref 135–145)

## 2020-12-28 MED ORDER — AZITHROMYCIN 250 MG PO TABS
500.0000 mg | ORAL_TABLET | Freq: Every day | ORAL | Status: DC
  Administered 2020-12-28 – 2020-12-30 (×3): 500 mg via ORAL
  Filled 2020-12-28 (×3): qty 2

## 2020-12-28 MED ORDER — CHLORHEXIDINE GLUCONATE 0.12 % MT SOLN
15.0000 mL | Freq: Two times a day (BID) | OROMUCOSAL | Status: DC
  Administered 2020-12-28 – 2020-12-30 (×5): 15 mL via OROMUCOSAL
  Filled 2020-12-28 (×5): qty 15

## 2020-12-28 MED ORDER — ORAL CARE MOUTH RINSE
15.0000 mL | Freq: Two times a day (BID) | OROMUCOSAL | Status: DC
  Administered 2020-12-29 – 2020-12-30 (×2): 15 mL via OROMUCOSAL

## 2020-12-28 MED ORDER — GUAIFENESIN-DM 100-10 MG/5ML PO SYRP: 5.0000 mL | ORAL_SOLUTION | ORAL | Status: DC | PRN

## 2020-12-28 MED ORDER — SODIUM CHLORIDE 0.9 % IV SOLN
2.0000 g | INTRAVENOUS | Status: DC
  Administered 2020-12-28 – 2020-12-30 (×3): 2 g via INTRAVENOUS
  Filled 2020-12-28 (×3): qty 20

## 2020-12-28 NOTE — Progress Notes (Signed)
Bladder scan at 1933 showed 279 cc in urine.  Pt assisted to [facility], moved bowels and voided 75 cc.  Night shift RN aware.  Angie Fava, RN

## 2020-12-28 NOTE — TOC Progression Note (Addendum)
Transition of Care [facility]) - Progression Note    Patient Details  Name: [name] MRN: [id number] Date of Birth: 10/05/1930  [facility]) CM/SW Contact  Ross Ludwig, [place] Phone Number: 12/28/2020, 10:59 AM  Clinical Narrative:     CSW spoke to patient's son Chrissie Noa to confirm he is in agreement to having patient go to [facility].  Per son, he will talk to patient and let CSW know.  11:58am  CSW received phone call back from son, patient has agreed to [facility], CSW to begin insurance authorization.  1:20pm  CSW was informed by physician that patient is not medically ready for discharge today.  CSW started insurance authorization and patient was approved reference number [id number].  CSW updated [facility], they can accept patient tomorrow if she is medically ready for discharge.  CSW contacted patient's son Chrissie Noa [phone], he is aware of patient's approval.  CSW to continue to follow patient's progress throughout discharge planning.   Expected Discharge Plan: [place] Barriers to Discharge: No Barriers Identified  Expected Discharge Plan and Services Expected Discharge Plan: [place] arrangements for the past 2 months: Single Family Home                                       Social Determinants of Health (SDOH) Interventions    Readmission Risk Interventions No flowsheet data found.

## 2020-12-28 NOTE — Progress Notes (Signed)
Occupational Therapy Treatment Patient Details Name: [name] MRN: [id number] DOB: 14-Jul-1930 Today's Date: 12/28/2020   History of present illness 87 year old Caucasian female from [place] facility with a past medical history significant for but not limited to COVID-19, anemia, arthritis, chronic diarrhea from her prior Colon Surgery, COPD, GERD, hypertension and other comorbidities who presented to the hospital with diarrhea x 3 days, negative for C-Diff, found with UTI, tachycardia and new onset A-Fib.   OT comments  Unfortunately pt with regression and significant drop in function as evidenced by a decreased score on 6-Clicks AM-PAC measure of occupational Performance with previous score of 16/24, and current score of 11/24 indicating a downward change in pt's ADL and functional mobility performance.  Today pt required Maximum assist for bed mobility and had decreased ability to take steps with poor balance in sitting and standing. Lethargy may be complicating factor, with HR in 70s and RR 14-16 per tele. Pt demonstrates fair rehab potential this visit, and may  benefit from continued skilled OT to increase safety and independence with ADLs and functional transfers to allow pt to return home safely and reduce caregiver burden and fall risk.     Recommendations for follow up therapy are one component of a multi-disciplinary discharge planning process, led by the attending physician.  Recommendations may be updated based on patient status, additional functional criteria and insurance authorization.    Follow Up Recommendations  Skilled nursing-short term rehab (<3 hours/day)    Assistance Recommended at Discharge Frequent or constant Supervision/Assistance  Equipment Recommendations  None recommended by OT    Recommendations for Other Services      Precautions / Restrictions Precautions Precautions: Fall Precaution Comments: incontinent of both urine and bowels-needs brief  when mobilizing Restrictions Weight Bearing Restrictions: No       Mobility Bed Mobility Overal bed mobility: Needs Assistance Bed Mobility: Supine to Sit;Sit to Supine     Supine to sit: Max assist Sit to supine: Mod assist   General bed mobility comments: Maximum Assist for trunk, bil LEs, and to scoot to EOB. Increased time. Cues required.    Transfers Overall transfer level: Needs assistance Equipment used: None (Gait belt and pt holding therapist's elbows) Transfers: Sit to/from Stand Sit to Stand: Mod assist           General transfer comment: Assist to rise, steady, control descent. Increased time. Cues required.     Balance Overall balance assessment: Needs assistance;History of Falls Sitting-balance support: Single extremity supported;Feet supported Sitting balance-Leahy Scale: Poor Sitting balance - Comments: Due to lethargy Postural control: Posterior lean;Other (comment) (Anterior lean) Standing balance support: Bilateral upper extremity supported Standing balance-Leahy Scale: Poor               High level balance activites: Side stepping High Level Balance Comments: Pt took 3 side steps with BUE support holding OT's elbows, delayed movements/bradykinesia, poor clearance of LT foot when stepping.           ADL either performed or assessed with clinical judgement   ADL       Grooming: Oral care;Maximal assistance;Sitting;Set up Grooming Details (indicate cue type and reason): Pt lethargic today, poor attention to topic/task. Pt assisted to EOB and performed oral hygiene with full setup of toothbrushtoothpaste and brush placed in pt's rt hand. Need of Max As to sip water from cup for rinsing and frequent cues to follow instructions and remain alert at EOB. Pt leaning anteriorly and posteriorly with eyes  open.               Lower Body Dressing Details (indicate cue type and reason): Total As to doff wet socks while sitting EOB (pt incontinent  of urine upon standing). Pt does not wear socks at baseline.   Toilet Transfer Details (indicate cue type and reason): Please Mobility and Balance sections of note. Toileting- Clothing Manipulation and Hygiene: Total assistance;Bed level Toileting - Clothing Manipulation Details (indicate cue type and reason): Pt required Total As at bed level to cleanse peri area due to lethargy.     Functional mobility during ADLs: Moderate assistance;Maximal assistance       Vision   Additional Comments: Eyes unforcused this session, drooping closed at times. Lethargic.   Perception     Praxis Praxis Praxis: Impaired Praxis Impairment Details: Initiation;Perseveration;Motor planning;Ideomotor    Cognition Arousal/Alertness: Lethargic Behavior During Therapy: Flat affect Overall Cognitive Status: Impaired/Different from baseline Area of Impairment: Orientation;Attention;Awareness;Following commands                 Orientation Level: Time;Situation Current Attention Level: Focused   Following Commands: Follows one step commands inconsistently   Awareness: Intellectual (with impairments)   General Comments: Minimally verbal this session. Whispering 1-word answers when necessary to respond. Very different from last week's evaluation.          Exercises     Shoulder Instructions       General Comments      Pertinent Vitals/ Pain       Pain Assessment: No/denies pain  Home Living                                          Prior Functioning/Environment              Frequency  Min 2X/week        Progress Toward Goals  OT Goals(current goals can now be found in the care plan section)  Progress towards OT goals: Not progressing toward goals - comment  Acute Rehab OT Goals OT Goal Formulation: Patient unable to participate in goal setting Potential to Achieve Goals: [place] Discharge plan remains appropriate    Co-evaluation                  AM-PAC OT "6 Clicks" Daily Activity     Outcome Measure   Help from another person eating meals?: A Lot Help from another person taking care of personal grooming?: A Lot Help from another person toileting, which includes using toliet, bedpan, or urinal?: Total Help from another person bathing (including washing, rinsing, drying)?: A Lot Help from another person to put on and taking off regular upper body clothing?: A Lot Help from another person to put on and taking off regular lower body clothing?: A Lot 6 Click Score: 11    End of Session Equipment Utilized During Treatment: Gait belt  OT Visit Diagnosis: Other abnormalities of gait and mobility (R26.89);Unsteadiness on feet (R26.81);History of falling (Z91.81);Muscle weakness (generalized) (M62.81);Cognitive communication deficit (R41.841);Other symptoms and signs involving cognitive function   Activity Tolerance Patient limited by lethargy   Patient Left in bed;with call bell/phone within reach;with bed alarm set;with family/visitor present   Nurse Communication Other (comment) Yong Channel, RN in room often and assisting.)        Time: [id number] OT Time Calculation (min): 25 min  Charges: OT General Charges $  OT Visit: 1 Visit OT Treatments $Self Care/Home Management : 8-22 mins $Therapeutic Activity: 8-22 mins  Anderson Malta, OT Acute Rehab Services Office: [phone] 12/28/2020  Julien Girt 12/28/2020, 12:25 PM

## 2020-12-28 NOTE — Progress Notes (Signed)
Pt drowsy, fell asleep halfway through breakfast.  Unaware that she had had a bowel movement. Son, Yvone Neu, says she seems more "lethargic" today, lethargy having started yesterday evening.  VS within normal range.  Some crackles in LLL.  MD made aware.  Angie Fava, RN

## 2020-12-28 NOTE — Progress Notes (Addendum)
PROGRESS NOTE    [name]  ZOX:[id number] DOB: [birth year] DOA: 12/20/2020 PCP: Lajean Manes, MD   Chief Complaint  Patient presents with   Diarrhea    Brief Narrative: [name], 87 y.o. female with PMH of anemia, osteoarthritis, cancer, chronic diarrhea, COPD, depression, GERD, goiter, headache, hypertension, unspecified peripheral neuropathy, osteoporosis, history of UTI presented from assisted living facility with complaint of generalized weakness, diarrhea x3 days. In the ED vitals fairly stable, although had episode of tachycardia in 130s to 140s, given IV fluids, labs showed UA consistent with UTI, mild hypokalemia creatinine 1.2. Patient is kept on IV fluids and antibiotics and admitted   Assessment & Plan:   Principal Problem:   Acute lower UTI (urinary tract infection) Active Problems:   Hypertension   General weakness   Hypokalemia   Volume depletion   UTI (urinary tract infection)   Protein-calorie malnutrition, severe   Diarrhea   Paroxysmal atrial fibrillation (HCC)   CHF NYHA class I (no symptoms from ordinary activities), acute, systolic (HCC)  #1 E. coli UTI -Afebrile.  Normal white count. -Improving clinically. -Urine cultures with E. coli UTI resistant to ampicillin, Bactrim, Zosyn.  Sensitive to the cephalosporins, fluoroquinolones, gentamicin, imipenem, Bactrim. -Was on IV Rocephin and transitioned to cefadroxil and completed a full course of antibiotic treatment.  2.  A flutter/A. fib with RVR CHA2DS2VASC score 4 -Patient noted to have converted to normal sinus rhythm this morning after being on Cardizem drip.. -2D echo with EF of 40 to 45%, global hypokinesis moderate LVH.  Right ventricular systolic function normal.  Left atrial size mildly dilated -Patient being followed by cardiology, amlodipine discontinued and patient on Coreg of 6.25 mg twice daily for rate control and dose adjusted to 12.5 mg twice daily for better blood pressure  control. -Continue low-dose Eliquis as recommended per cardiology for anticoagulation. -Cardiology recommending a 30-day heart monitor to evaluate for recurrence of A. fib/flutter on discharge. -Patient will need to be closely monitored for falls and anemia. -Per cardiology.  3.  Newly recognized CHF -2D echo with EF of 40 to 45% with global hypokinesis, LVH, mild MR, moderate TR. -Patient clinically does not look volume overloaded on examination and has been having chronic intermittent diarrhea. -Continue current regimen of Coreg, losartan.   -Outpatient follow-up with cardiology for repeat limited 2D echo 2-3 months.   4.  Hypertension -Norvasc discontinued. -Coreg increased to 12.5 mg twice daily per cardiology recommendations and patient started on losartan 12.5 mg daily. -Outpatient follow-up  5.  Chronic diarrhea -The patient has had diarrhea since her colon surgery and opium tincture usually helps manage it. -Stool studies are negative for C. difficile PCR. -Patient states usually takes a opium tincture before meals which she was not initially getting early on in the hospitalization and currently placed on home regimen of before meals.   -Continue opium tincture.   -Outpatient follow-up.  6.  Generalized weakness/debility/deconditioning -Patient seen by PT OT who are recommending SNF. -TOC consulted for SNF placement.  7.  Hypokalemia -Repleted. -Potassium at 3.8.   -Follow.  8.  Severe protein calorie malnutrition -Continue nutritional supplementation.   9.  Drowsy/lethargy/??CAP. -Per RN patient more drowsy today with delayed slow responses. -Patient with no focal neurological deficits, moving extremities spontaneously. -Patient with some delayed responses however was improving with conversation and discussion with patient. -Patient noted with complaints of congestion which started in the past 24 hours. -Chest x-ray obtained with concerns of a left lower lobe  infiltrate. -Check urine Legionella antigen, urine pneumococcus antigen -Continue Claritin, Flonase. -Start Mucinex, empiric IV Rocephin and azithromycin. -Supportive care.  9.  Pressure injury stage II on the coccyx, POA Pressure Injury 12/20/18 Coccyx Medial Stage II -  Partial thickness loss of dermis presenting as a shallow open ulcer with a red, pink wound bed without slough. pink, open skin, non-blanchable (Active)  12/20/18 1415  Location: Coccyx  Location Orientation: Medial  Staging: Stage II -  Partial thickness loss of dermis presenting as a shallow open ulcer with a red, pink wound bed without slough.  Wound Description (Comments): pink, open skin, non-blanchable  Present on Admission: Yes         DVT prophylaxis: Eliquis Code Status: Full Family Communication: Updated patient.  Updated son at bedside.  Disposition:   Status is: Inpatient  Remains inpatient appropriate because: Ongoing management of patient's tachycardia, UTI, generalized weakness.  Unsafe disposition.       Consultants:  Cardiology: Dr. Sallyanne Kuster 12/23/2018  Procedures:  2D echo 12/22/2020 Chest x-ray 12/28/2020  Antimicrobials:  IV Rocephin 12/21/2020>>>>> 12/23/2020 Cefadroxil 12/24/2020>>>>> 12/28/2020   Subjective: Was called by RN that patient with delayed responses.  Came and assessed patient patient initially with some delayed responses however as conversation progressed her responses improved.  No chest pain.  No shortness of breath.  No abdominal pain.  Patient with significant nasal congestion.  Patient very tearful and sad that his son cannot stay in town with her and has to go back to [place].      Objective: Vitals:   12/28/20 0000 12/28/20 0400 12/28/20 0637 12/28/20 1157  BP:   135/68 (!) 150/73  Pulse:   73 78  Resp: 14 15 18 16   Temp:   98.2 F (36.8 C) 97.7 F (36.5 C)  TempSrc:   Oral Axillary  SpO2:   92% 95%  Weight:      Height:        Intake/Output  Summary (Last 24 hours) at 12/28/2020 1224 Last data filed at 12/27/2020 2200 Gross per 24 hour  Intake 360 ml  Output --  Net 360 ml    Filed Weights   12/23/20 1513  Weight: 51.7 kg    Examination: General exam: : NAD.  Congested. Respiratory system: Some decreased breath sounds in the bases otherwise clear.  No wheezes, no rhonchi.  Normal respiratory effort.  Cardiovascular system: RRR no murmurs rubs or gallops.  No JVD.  No lower extremity edema. Gastrointestinal system: Abdomen is soft, nontender, nondistended, positive bowel sounds.  No rebound.  No guarding.  Central nervous system: Alert and oriented with some delayed responses.  Moving extremities spontaneously.  No focal neurological deficits. Extremities: Symmetric 5 x 5 power. Skin: No rashes, lesions or ulcers Psychiatry: Judgement and insight appear normal. Mood & affect appropriate.  Data Reviewed: I have personally reviewed following labs and imaging studies  CBC: Recent Labs  Lab 12/23/20 0400 12/24/20 0842 12/25/20 0354 12/27/20 0357  WBC 6.9 7.3 7.6 7.7  NEUTROABS  --  5.4  --   --   HGB 10.4* 10.9* 11.0* 9.9*  HCT 32.4* 34.0* 34.9* 31.4*  MCV 91.8 92.9 92.8 92.4  PLT 188 225 270 321     Basic Metabolic Panel: Recent Labs  Lab 12/24/20 0842 12/25/20 0354 12/26/20 0338 12/27/20 0357 12/28/20 0353  NA 135 131* 132* 134* 136  K 4.1 4.2 4.3 4.3 3.8  CL 104 99 100 97* 102  CO2 25 25 26 27  26  GLUCOSE 99 111* 107* 129* 146*  BUN 26* 34* 37* 48* 39*  CREATININE 0.95 0.79 0.96 1.21* 0.95  CALCIUM 8.3* 8.2* 8.2* 8.5* 8.1*  MG 2.0 1.9 1.9 2.1  --      GFR: Estimated Creatinine Clearance: 33.4 mL/min (by C-G formula based on SCr of 0.95 mg/dL).  Liver Function Tests: No results for input(s): AST, ALT, ALKPHOS, BILITOT, PROT, ALBUMIN in the last 168 hours.   CBG: No results for input(s): GLUCAP in the last 168 hours.   Recent Results (from the past 240 hour(s))  Resp Panel by RT-PCR  (Flu A&B, Covid) Nasopharyngeal Swab     Status: None   Collection Time: 12/21/20 10:54 AM   Specimen: Nasopharyngeal Swab; Nasopharyngeal(NP) swabs in vial transport medium  Result Value Ref Range Status   SARS Coronavirus 2 by RT PCR NEGATIVE NEGATIVE Final    Comment: (NOTE) SARS-CoV-2 target nucleic acids are NOT DETECTED.  The SARS-CoV-2 RNA is generally detectable in upper respiratory specimens during the acute phase of infection. The lowest concentration of SARS-CoV-2 viral copies this assay can detect is 138 copies/mL. A negative result does not preclude SARS-Cov-2 infection and should not be used as the sole basis for treatment or other patient management decisions. A negative result may occur with  improper specimen collection/handling, submission of specimen other than nasopharyngeal swab, presence of viral mutation(s) within the areas targeted by this assay, and inadequate number of viral copies(<138 copies/mL). A negative result must be combined with clinical observations, patient history, and epidemiological information. The expected result is Negative.  Fact Sheet for Patients:  EntrepreneurPulse.com.au  Fact Sheet for Healthcare Providers:  IncredibleEmployment.be  This test is no t yet approved or cleared by the [place] FDA and  has been authorized for detection and/or diagnosis of SARS-CoV-2 by FDA under an Emergency Use Authorization (EUA). This EUA will remain  in effect (meaning this test can be used) for the duration of the COVID-19 declaration under Section 564(b)(1) of the Act, 21 U.S.C.section 360bbb-3(b)(1), unless the authorization is terminated  or revoked sooner.       Influenza A by PCR NEGATIVE NEGATIVE Final   Influenza B by PCR NEGATIVE NEGATIVE Final    Comment: (NOTE) The Xpert Xpress SARS-CoV-2/FLU/RSV plus assay is intended as an aid in the diagnosis of influenza from Nasopharyngeal swab specimens  and should not be used as a sole basis for treatment. Nasal washings and aspirates are unacceptable for Xpert Xpress SARS-CoV-2/FLU/RSV testing.  Fact Sheet for Patients: EntrepreneurPulse.com.au  Fact Sheet for Healthcare Providers: IncredibleEmployment.be  This test is not yet approved or cleared by the [place] FDA and has been authorized for detection and/or diagnosis of SARS-CoV-2 by FDA under an Emergency Use Authorization (EUA). This EUA will remain in effect (meaning this test can be used) for the duration of the COVID-19 declaration under Section 564(b)(1) of the Act, 21 U.S.C. section 360bbb-3(b)(1), unless the authorization is terminated or revoked.  Performed at [facility], [place] [address], [place], [place] [address]   Urine Culture     Status: Abnormal   Collection Time: 12/21/20 11:07 AM   Specimen: Urine, Clean Catch  Result Value Ref Range Status   Specimen Description   Final    URINE, CLEAN CATCH Performed at [facility], [place] [address], [place], [place] [address]    Special Requests   Final    NONE Performed at [facility], [address] [place], [place]  [address]    Culture >=100,000 COLONIES/mL ESCHERICHIA COLI (A)  Final   Report Status 12/23/2020 FINAL  Final   Organism ID, Bacteria ESCHERICHIA COLI (A)  Final      Susceptibility   Escherichia coli - MIC*    AMPICILLIN >=32 RESISTANT Resistant     CEFAZOLIN <=4 SENSITIVE Sensitive     CEFEPIME <=0.12 SENSITIVE Sensitive     CEFTRIAXONE <=0.25 SENSITIVE Sensitive     CIPROFLOXACIN <=0.25 SENSITIVE Sensitive     GENTAMICIN <=1 SENSITIVE Sensitive     IMIPENEM <=0.25 SENSITIVE Sensitive     NITROFURANTOIN 32 SENSITIVE Sensitive     TRIMETH/SULFA >=320 RESISTANT Resistant     AMPICILLIN/SULBACTAM >=32 RESISTANT Resistant     PIP/TAZO <=4 SENSITIVE Sensitive     * >=100,000 COLONIES/mL ESCHERICHIA  COLI  Gastrointestinal Panel by PCR , Stool     Status: None   Collection Time: 12/21/20  4:19 PM   Specimen: Stool  Result Value Ref Range Status   Campylobacter species NOT DETECTED NOT DETECTED Final   Plesimonas shigelloides NOT DETECTED NOT DETECTED Final   Salmonella species NOT DETECTED NOT DETECTED Final   Yersinia enterocolitica NOT DETECTED NOT DETECTED Final   Vibrio species NOT DETECTED NOT DETECTED Final   Vibrio cholerae NOT DETECTED NOT DETECTED Final   Enteroaggregative E coli (EAEC) NOT DETECTED NOT DETECTED Final   Enteropathogenic E coli (EPEC) NOT DETECTED NOT DETECTED Final   Enterotoxigenic E coli (ETEC) NOT DETECTED NOT DETECTED Final   Shiga like toxin producing E coli (STEC) NOT DETECTED NOT DETECTED Final   Shigella/Enteroinvasive E coli (EIEC) NOT DETECTED NOT DETECTED Final   Cryptosporidium NOT DETECTED NOT DETECTED Final   Cyclospora cayetanensis NOT DETECTED NOT DETECTED Final   Entamoeba histolytica NOT DETECTED NOT DETECTED Final   Giardia lamblia NOT DETECTED NOT DETECTED Final   Adenovirus F40/41 NOT DETECTED NOT DETECTED Final   Astrovirus NOT DETECTED NOT DETECTED Final   Norovirus GI/GII NOT DETECTED NOT DETECTED Final   Rotavirus A NOT DETECTED NOT DETECTED Final   Sapovirus (I, II, IV, and V) NOT DETECTED NOT DETECTED Final    Comment: Performed at [facility], [address] [place], [place] [address]  C Difficile Quick Screen w PCR reflex     Status: None   Collection Time: 12/21/20  4:19 PM   Specimen: Stool  Result Value Ref Range Status   C Diff antigen NEGATIVE NEGATIVE Final   C Diff toxin NEGATIVE NEGATIVE Final   C Diff interpretation No C. difficile detected.  Final    Comment: Performed at [facility], [place] [address], [place], [place] [address]  Resp Panel by RT-PCR (Flu A&B, Covid) Nasopharyngeal Swab     Status: None   Collection Time: 12/27/20  5:50 AM   Specimen: Nasopharyngeal Swab;  Nasopharyngeal(NP) swabs in vial transport medium  Result Value Ref Range Status   SARS Coronavirus 2 by RT PCR NEGATIVE NEGATIVE Final    Comment: (NOTE) SARS-CoV-2 target nucleic acids are NOT DETECTED.  The SARS-CoV-2 RNA is generally detectable in upper respiratory specimens during the acute phase of infection. The lowest concentration of SARS-CoV-2 viral copies this assay can detect is 138 copies/mL. A negative result does not preclude SARS-Cov-2 infection and should not be used as the sole basis for treatment or other patient management decisions. A negative result may occur with  improper specimen collection/handling, submission of specimen other than nasopharyngeal swab, presence of viral mutation(s) within the  areas targeted by this assay, and inadequate number of viral copies(<138 copies/mL). A negative result must be combined with clinical observations, patient history, and epidemiological information. The expected result is Negative.  Fact Sheet for Patients:  EntrepreneurPulse.com.au  Fact Sheet for Healthcare Providers:  IncredibleEmployment.be  This test is no t yet approved or cleared by the [place] FDA and  has been authorized for detection and/or diagnosis of SARS-CoV-2 by FDA under an Emergency Use Authorization (EUA). This EUA will remain  in effect (meaning this test can be used) for the duration of the COVID-19 declaration under Section 564(b)(1) of the Act, 21 U.S.C.section 360bbb-3(b)(1), unless the authorization is terminated  or revoked sooner.       Influenza A by PCR NEGATIVE NEGATIVE Final   Influenza B by PCR NEGATIVE NEGATIVE Final    Comment: (NOTE) The Xpert Xpress SARS-CoV-2/FLU/RSV plus assay is intended as an aid in the diagnosis of influenza from Nasopharyngeal swab specimens and should not be used as a sole basis for treatment. Nasal washings and aspirates are unacceptable for Xpert Xpress  SARS-CoV-2/FLU/RSV testing.  Fact Sheet for Patients: EntrepreneurPulse.com.au  Fact Sheet for Healthcare Providers: IncredibleEmployment.be  This test is not yet approved or cleared by the [place] FDA and has been authorized for detection and/or diagnosis of SARS-CoV-2 by FDA under an Emergency Use Authorization (EUA). This EUA will remain in effect (meaning this test can be used) for the duration of the COVID-19 declaration under Section 564(b)(1) of the Act, 21 U.S.C. section 360bbb-3(b)(1), unless the authorization is terminated or revoked.  Performed at [facility], [place] [address] [place], [place] [address]           Radiology Studies: No results found.      Scheduled Meds:  (feeding supplement) PROSource Plus  30 mL Oral BID BM   apixaban  2.5 mg Oral BID   carvedilol  12.5 mg Oral BID   feeding supplement  1 Container Oral BID BM   fluticasone  2 spray Each Nare Daily   guaiFENesin  600 mg Oral BID   loratadine  10 mg Oral Daily   losartan  12.5 mg Oral Daily   magnesium oxide  400 mg Oral BID   multivitamin with minerals  1 tablet Oral Daily   Opium  0.6 mL Oral TID AC & HS   Continuous Infusions:     LOS: 6 days    Time spent: 35 minutes    Irine Seal, MD [facility]   To contact the attending provider between 7A-7P or the covering provider during after hours 7P-7A, please log into the web site [email or web address] and access using universal [place] password for that web site. If you do not have the password, please call the hospital operator.  12/28/2020, 12:24 PM

## 2020-12-29 LAB — CBC WITH DIFFERENTIAL/PLATELET
Abs Immature Granulocytes: 0.12 10*3/uL — ABNORMAL HIGH (ref 0.00–0.07)
Basophils Absolute: 0 10*3/uL (ref 0.0–0.1)
Basophils Relative: 0 %
Eosinophils Absolute: 0.2 10*3/uL (ref 0.0–0.5)
Eosinophils Relative: 2 %
HCT: 30.9 % — ABNORMAL LOW (ref 36.0–46.0)
Hemoglobin: 9.8 g/dL — ABNORMAL LOW (ref 12.0–15.0)
Immature Granulocytes: 1 %
Lymphocytes Relative: 10 %
Lymphs Abs: 1 10*3/uL (ref 0.7–4.0)
MCH: 29.6 pg (ref 26.0–34.0)
MCHC: 31.7 g/dL (ref 30.0–36.0)
MCV: 93.4 fL (ref 80.0–100.0)
Monocytes Absolute: 1 10*3/uL (ref 0.1–1.0)
Monocytes Relative: 10 %
Neutro Abs: 7.5 10*3/uL (ref 1.7–7.7)
Neutrophils Relative %: 77 %
Platelets: 378 10*3/uL (ref 150–400)
RBC: 3.31 MIL/uL — ABNORMAL LOW (ref 3.87–5.11)
RDW: 14 % (ref 11.5–15.5)
WBC: 9.9 10*3/uL (ref 4.0–10.5)
nRBC: 0 % (ref 0.0–0.2)

## 2020-12-29 LAB — BASIC METABOLIC PANEL
Anion gap: 7 (ref 5–15)
BUN: 48 mg/dL — ABNORMAL HIGH (ref 8–23)
CO2: 25 mmol/L (ref 22–32)
Calcium: 8 mg/dL — ABNORMAL LOW (ref 8.9–10.3)
Chloride: 99 mmol/L (ref 98–111)
Creatinine, Ser: 0.95 mg/dL (ref 0.44–1.00)
GFR, Estimated: 58 mL/min — ABNORMAL LOW (ref >60)
Glucose, Bld: 116 mg/dL — ABNORMAL HIGH (ref 70–99)
Potassium: 4.4 mmol/L (ref 3.5–5.1)
Sodium: 131 mmol/L — ABNORMAL LOW (ref 135–145)

## 2020-12-29 LAB — MAGNESIUM: Magnesium: 2.3 mg/dL (ref 1.7–2.4)

## 2020-12-29 NOTE — Care Management Important Message (Signed)
Important Message  Patient Details IM Letter given to the Patient. Name: [name] MRN: [id number] Date of Birth: 11-Jul-1930   Medicare Important Message Given:  Yes     Kerin Salen 12/29/2020, 12:13 PM

## 2020-12-29 NOTE — Progress Notes (Signed)
PROGRESS NOTE    [name]  KGM:[id number] DOB: 04-11-1930 DOA: 12/20/2020 PCP: Lajean Manes, MD  Brief Narrative: [name], 87 y.o. female with PMH of anemia, osteoarthritis, cancer, chronic diarrhea, COPD, depression, GERD, goiter, headache, hypertension, unspecified peripheral neuropathy, osteoporosis, history of UTI presented from assisted living facility with complaint of generalized weakness, diarrhea x3 days. In the ED vitals fairly stable, although had episode of tachycardia in 130s to 140s, given IV fluids, labs showed UA consistent with UTI, mild hypokalemia creatinine 1.2. Patient is kept on IV fluids and antibiotics and admitted  Assessment & Plan:   Principal Problem:   Acute lower UTI (urinary tract infection) Active Problems:   Hypertension   General weakness   Hypokalemia   Volume depletion   UTI (urinary tract infection)   Protein-calorie malnutrition, severe   Diarrhea   Paroxysmal atrial fibrillation (HCC)   CHF NYHA class I (no symptoms from ordinary activities), acute, systolic (HCC)   #1 E. coli UTI patient finished her course of treatment with Rocephin/cefadroxil  #2 newly diagnosed heart failure-echo this admission shows ejection fraction 40 to 45% with global hypokinesis. Continue Coreg and losartan. Outpatient follow-up with cardiology.  #3 history of essential hypertension continue Coreg at 12.5 mg twice a day and adjust the dose as needed  #4 chronic diarrhea stool negative for C. difficile.  She takes opium tincture at home which manages her diarrhea continue.  #5 A. fib/a flutter with CHA2DS2-VASc score 4.  Patient was seen by cardiology.she was started on Coreg and Eliquis   Pressure Injury 12/20/18 Coccyx Medial Stage II -  Partial thickness loss of dermis presenting as a shallow open ulcer with a red, pink wound bed without slough. pink, open skin, non-blanchable (Active)  12/20/18 1415  Location: Coccyx  Location Orientation: Medial   Staging: Stage II -  Partial thickness loss of dermis presenting as a shallow open ulcer with a red, pink wound bed without slough.  Wound Description (Comments): pink, open skin, non-blanchable  Present on Admission: Yes      Nutrition Problem: Severe Malnutrition Etiology: chronic illness     Signs/Symptoms: severe fat depletion, severe muscle depletion    Interventions: Boost Breeze, MVI, Prostat  Estimated body mass index is 18.97 kg/m as calculated from the following:   Height as of this encounter: 5\' 5"  (1.651 m).   Weight as of this encounter: 51.7 kg.  DVT prophylaxis:eliquis Code Status: full Family Communication: none Disposition Plan:  Status is: Inpatient  Remains inpatient appropriate because: Patient tachycardic with weakness   Consultants:  Cardiology  Procedures: None Antimicrobials: None  Subjective: She is resting in bed she is awake and answers my questions appropriately.  Objective: Vitals:   12/28/20 2350 12/29/20 0415 12/29/20 0900 12/29/20 1157  BP: (!) 117/52 (!) 113/59 (!) 169/78 (!) 85/43  Pulse: 96 81  72  Resp:  20  16  Temp:  98.3 F (36.8 C)  98.3 F (36.8 C)  TempSrc:  Oral    SpO2:  94%  95%  Weight:      Height:        Intake/Output Summary (Last 24 hours) at 12/29/2020 1536 Last data filed at 12/28/2020 2222 Gross per 24 hour  Intake 1060 ml  Output 75 ml  Net 985 ml   Filed Weights   12/23/20 1513  Weight: 51.7 kg    Examination:  General exam: Appears calm and comfortable  Respiratory system: Decreased diminished breath sounds at the bases  to auscultation. Respiratory effort normal. Cardiovascular system: S1 & S2 heard, RRR. No JVD, murmurs, rubs, gallops or clicks. No pedal edema. Gastrointestinal system: Abdomen is nondistended, soft and nontender. No organomegaly or masses felt. Normal bowel sounds heard. Central nervous system: Alert and oriented. No focal neurological deficits. Extremities: Symmetric 5  x 5 power. Skin: No rashes, lesions or ulcers Psychiatry: Judgement and insight appear normal. Mood & affect appropriate.     Data Reviewed: I have personally reviewed following labs and imaging studies  CBC: Recent Labs  Lab 12/23/20 0400 12/24/20 0842 12/25/20 0354 12/27/20 0357 12/29/20 0348  WBC 6.9 7.3 7.6 7.7 9.9  NEUTROABS  --  5.4  --   --  7.5  HGB 10.4* 10.9* 11.0* 9.9* 9.8*  HCT 32.4* 34.0* 34.9* 31.4* 30.9*  MCV 91.8 92.9 92.8 92.4 93.4  PLT 188 225 270 321 235   Basic Metabolic Panel: Recent Labs  Lab 12/24/20 0842 12/25/20 0354 12/26/20 0338 12/27/20 0357 12/28/20 0353 12/29/20 0348  NA 135 131* 132* 134* 136 131*  K 4.1 4.2 4.3 4.3 3.8 4.4  CL 104 99 100 97* 102 99  CO2 25 25 26 27 26 25   GLUCOSE 99 111* 107* 129* 146* 116*  BUN 26* 34* 37* 48* 39* 48*  CREATININE 0.95 0.79 0.96 1.21* 0.95 0.95  CALCIUM 8.3* 8.2* 8.2* 8.5* 8.1* 8.0*  MG 2.0 1.9 1.9 2.1  --  2.3   GFR: Estimated Creatinine Clearance: 33.4 mL/min (by C-G formula based on SCr of 0.95 mg/dL). Liver Function Tests: No results for input(s): AST, ALT, ALKPHOS, BILITOT, PROT, ALBUMIN in the last 168 hours. No results for input(s): LIPASE, AMYLASE in the last 168 hours. No results for input(s): AMMONIA in the last 168 hours. Coagulation Profile: No results for input(s): INR, PROTIME in the last 168 hours. Cardiac Enzymes: No results for input(s): CKTOTAL, CKMB, CKMBINDEX, TROPONINI in the last 168 hours. BNP (last 3 results) No results for input(s): PROBNP in the last 8760 hours. HbA1C: No results for input(s): HGBA1C in the last 72 hours. CBG: No results for input(s): GLUCAP in the last 168 hours. Lipid Profile: No results for input(s): CHOL, HDL, LDLCALC, TRIG, CHOLHDL, LDLDIRECT in the last 72 hours. Thyroid Function Tests: No results for input(s): TSH, T4TOTAL, FREET4, T3FREE, THYROIDAB in the last 72 hours. Anemia Panel: No results for input(s): VITAMINB12, FOLATE, FERRITIN,  TIBC, IRON, RETICCTPCT in the last 72 hours. Sepsis Labs: No results for input(s): PROCALCITON, LATICACIDVEN in the last 168 hours.  Recent Results (from the past 240 hour(s))  Resp Panel by RT-PCR (Flu A&B, Covid) Nasopharyngeal Swab     Status: None   Collection Time: 12/21/20 10:54 AM   Specimen: Nasopharyngeal Swab; Nasopharyngeal(NP) swabs in vial transport medium  Result Value Ref Range Status   SARS Coronavirus 2 by RT PCR NEGATIVE NEGATIVE Final    Comment: (NOTE) SARS-CoV-2 target nucleic acids are NOT DETECTED.  The SARS-CoV-2 RNA is generally detectable in upper respiratory specimens during the acute phase of infection. The lowest concentration of SARS-CoV-2 viral copies this assay can detect is 138 copies/mL. A negative result does not preclude SARS-Cov-2 infection and should not be used as the sole basis for treatment or other patient management decisions. A negative result may occur with  improper specimen collection/handling, submission of specimen other than nasopharyngeal swab, presence of viral mutation(s) within the areas targeted by this assay, and inadequate number of viral copies(<138 copies/mL). A negative result must be combined with clinical  observations, patient history, and epidemiological information. The expected result is Negative.  Fact Sheet for Patients:  EntrepreneurPulse.com.au  Fact Sheet for Healthcare Providers:  IncredibleEmployment.be  This test is no t yet approved or cleared by the [place] FDA and  has been authorized for detection and/or diagnosis of SARS-CoV-2 by FDA under an Emergency Use Authorization (EUA). This EUA will remain  in effect (meaning this test can be used) for the duration of the COVID-19 declaration under Section 564(b)(1) of the Act, 21 U.S.C.section 360bbb-3(b)(1), unless the authorization is terminated  or revoked sooner.       Influenza A by PCR NEGATIVE NEGATIVE Final    Influenza B by PCR NEGATIVE NEGATIVE Final    Comment: (NOTE) The Xpert Xpress SARS-CoV-2/FLU/RSV plus assay is intended as an aid in the diagnosis of influenza from Nasopharyngeal swab specimens and should not be used as a sole basis for treatment. Nasal washings and aspirates are unacceptable for Xpert Xpress SARS-CoV-2/FLU/RSV testing.  Fact Sheet for Patients: EntrepreneurPulse.com.au  Fact Sheet for Healthcare Providers: IncredibleEmployment.be  This test is not yet approved or cleared by the [place] FDA and has been authorized for detection and/or diagnosis of SARS-CoV-2 by FDA under an Emergency Use Authorization (EUA). This EUA will remain in effect (meaning this test can be used) for the duration of the COVID-19 declaration under Section 564(b)(1) of the Act, 21 U.S.C. section 360bbb-3(b)(1), unless the authorization is terminated or revoked.  Performed at [facility], [place] [address], [place], [place] [address]   Urine Culture     Status: Abnormal   Collection Time: 12/21/20 11:07 AM   Specimen: Urine, Clean Catch  Result Value Ref Range Status   Specimen Description   Final    URINE, CLEAN CATCH Performed at [facility], [place] [address], [place], [place] [address]    Special Requests   Final    NONE Performed at [facility], [place] [address] [place], [place] [address]    Culture >=100,000 COLONIES/mL ESCHERICHIA COLI (A)  Final   Report Status 12/23/2020 FINAL  Final   Organism ID, Bacteria ESCHERICHIA COLI (A)  Final      Susceptibility   Escherichia coli - MIC*    AMPICILLIN >=32 RESISTANT Resistant     CEFAZOLIN <=4 SENSITIVE Sensitive     CEFEPIME <=0.12 SENSITIVE Sensitive     CEFTRIAXONE <=0.25 SENSITIVE Sensitive     CIPROFLOXACIN <=0.25 SENSITIVE Sensitive     GENTAMICIN <=1 SENSITIVE Sensitive     IMIPENEM <=0.25 SENSITIVE Sensitive      NITROFURANTOIN 32 SENSITIVE Sensitive     TRIMETH/SULFA >=320 RESISTANT Resistant     AMPICILLIN/SULBACTAM >=32 RESISTANT Resistant     PIP/TAZO <=4 SENSITIVE Sensitive     * >=100,000 COLONIES/mL ESCHERICHIA COLI  Gastrointestinal Panel by PCR , Stool     Status: None   Collection Time: 12/21/20  4:19 PM   Specimen: Stool  Result Value Ref Range Status   Campylobacter species NOT DETECTED NOT DETECTED Final   Plesimonas shigelloides NOT DETECTED NOT DETECTED Final   Salmonella species NOT DETECTED NOT DETECTED Final   Yersinia enterocolitica NOT DETECTED NOT DETECTED Final   Vibrio species NOT DETECTED NOT DETECTED Final   Vibrio cholerae NOT DETECTED NOT DETECTED Final   Enteroaggregative E coli (EAEC) NOT DETECTED NOT DETECTED Final   Enteropathogenic E coli (EPEC) NOT DETECTED NOT DETECTED Final   Enterotoxigenic E coli (ETEC) NOT DETECTED NOT DETECTED Final  Shiga like toxin producing E coli (STEC) NOT DETECTED NOT DETECTED Final   Shigella/Enteroinvasive E coli (EIEC) NOT DETECTED NOT DETECTED Final   Cryptosporidium NOT DETECTED NOT DETECTED Final   Cyclospora cayetanensis NOT DETECTED NOT DETECTED Final   Entamoeba histolytica NOT DETECTED NOT DETECTED Final   Giardia lamblia NOT DETECTED NOT DETECTED Final   Adenovirus F40/41 NOT DETECTED NOT DETECTED Final   Astrovirus NOT DETECTED NOT DETECTED Final   Norovirus GI/GII NOT DETECTED NOT DETECTED Final   Rotavirus A NOT DETECTED NOT DETECTED Final   Sapovirus (I, II, IV, and V) NOT DETECTED NOT DETECTED Final    Comment: Performed at [facility], [address] [place], [place] [address]  C Difficile Quick Screen w PCR reflex     Status: None   Collection Time: 12/21/20  4:19 PM   Specimen: Stool  Result Value Ref Range Status   C Diff antigen NEGATIVE NEGATIVE Final   C Diff toxin NEGATIVE NEGATIVE Final   C Diff interpretation No C. difficile detected.  Final    Comment: Performed at [facility], [place] [address], [place], [place] [address]  Resp Panel by RT-PCR (Flu A&B, Covid) Nasopharyngeal Swab     Status: None   Collection Time: 12/27/20  5:50 AM   Specimen: Nasopharyngeal Swab; Nasopharyngeal(NP) swabs in vial transport medium  Result Value Ref Range Status   SARS Coronavirus 2 by RT PCR NEGATIVE NEGATIVE Final    Comment: (NOTE) SARS-CoV-2 target nucleic acids are NOT DETECTED.  The SARS-CoV-2 RNA is generally detectable in upper respiratory specimens during the acute phase of infection. The lowest concentration of SARS-CoV-2 viral copies this assay can detect is 138 copies/mL. A negative result does not preclude SARS-Cov-2 infection and should not be used as the sole basis for treatment or other patient management decisions. A negative result may occur with  improper specimen collection/handling, submission of specimen other than nasopharyngeal swab, presence of viral mutation(s) within the areas targeted by this assay, and inadequate number of viral copies(<138 copies/mL). A negative result must be combined with clinical observations, patient history, and epidemiological information. The expected result is Negative.  Fact Sheet for Patients:  EntrepreneurPulse.com.au  Fact Sheet for Healthcare Providers:  IncredibleEmployment.be  This test is no t yet approved or cleared by the [place] FDA and  has been authorized for detection and/or diagnosis of SARS-CoV-2 by FDA under an Emergency Use Authorization (EUA). This EUA will remain  in effect (meaning this test can be used) for the duration of the COVID-19 declaration under Section 564(b)(1) of the Act, 21 U.S.C.section 360bbb-3(b)(1), unless the authorization is terminated  or revoked sooner.       Influenza A by PCR NEGATIVE NEGATIVE Final   Influenza B by PCR NEGATIVE NEGATIVE Final    Comment: (NOTE) The Xpert Xpress SARS-CoV-2/FLU/RSV plus assay is intended  as an aid in the diagnosis of influenza from Nasopharyngeal swab specimens and should not be used as a sole basis for treatment. Nasal washings and aspirates are unacceptable for Xpert Xpress SARS-CoV-2/FLU/RSV testing.  Fact Sheet for Patients: EntrepreneurPulse.com.au  Fact Sheet for Healthcare Providers: IncredibleEmployment.be  This test is not yet approved or cleared by the [place] FDA and has been authorized for detection and/or diagnosis of SARS-CoV-2 by FDA under an Emergency Use Authorization (EUA). This EUA will remain in effect (meaning this test can be used) for the duration of the COVID-19 declaration under Section 564(b)(1) of the Act, 21  U.S.C. section 360bbb-3(b)(1), unless the authorization is terminated or revoked.  Performed at [facility], [place] [address], [place], [place] [address]          Radiology Studies: DG CHEST PORT 1 VIEW  Result Date: 12/28/2020 CLINICAL DATA:  Lethargy,  COPD EXAM: PORTABLE CHEST 1 VIEW COMPARISON:  01/22/2019 FINDINGS: Transverse diameter of heart is within normal limits. Thoracic aorta is tortuous and ectatic. Low position of diaphragms suggests COPD. Infiltrate is seen in medial left lower lung fields. There is blunting of left lateral CP angle. Right lateral CP angle is clear. There is no pneumothorax. IMPRESSION: COPD. Infiltrate in left lower lung fields suggests atelectasis/pneumonia. Follow-up studies until complete clearing occurs should be considered. Small left pleural effusion. Electronically Signed   By: Elmer Picker M.D.   On: 12/28/2020 13:33        Scheduled Meds:  (feeding supplement) PROSource Plus  30 mL Oral BID BM   apixaban  2.5 mg Oral BID   azithromycin  500 mg Oral Daily   carvedilol  12.5 mg Oral BID   chlorhexidine  15 mL Mouth Rinse BID   feeding supplement  1 Container Oral BID BM   fluticasone  2 spray Each Nare Daily    guaiFENesin  600 mg Oral BID   loratadine  10 mg Oral Daily   losartan  12.5 mg Oral Daily   mouth rinse  15 mL Mouth Rinse q12n4p   multivitamin with minerals  1 tablet Oral Daily   Opium  0.6 mL Oral TID AC & HS   Continuous Infusions:  cefTRIAXone (ROCEPHIN)  IV 2 g (12/28/20 2201)     LOS: 7 days    Georgette Shell, MD 12/29/2020, 3:36 PM

## 2020-12-29 NOTE — Progress Notes (Signed)
Physical Therapy Treatment Patient Details Name: ADAMARI FREDE MRN: [id number] DOB: [birth year] Today's Date: 12/29/2020   History of Present Illness 87 year old Caucasian [facility] with a past medical history significant for but not limited to COVID-19, anemia, arthritis, chronic diarrhea from her prior Colon Surgery, COPD, GERD, hypertension and other comorbidities who presented to the hospital with diarrhea x 3 days, negative for C-Diff, found with UTI, tachycardia and new onset A-Fib.    PT Comments    Pt in bed AxO x 1 but mostly lethargic,sleepy responding slowly to commands.  Few words spoken.  Faur eye contact.  Very fragile.  Assisted OOB to [facility] was difficult.  Required + 2 Max/Total Assist.  Pt unable to support herself upright.  Limited gait distance of 3 feet.   Pt will need ST Rehab at SNF.   Recommendations for follow up therapy are one component of a multi-disciplinary discharge planning process, led by the attending physician.  Recommendations may be updated based on patient status, additional functional criteria and insurance authorization.  Follow Up Recommendations  Skilled nursing-short term rehab (<3 hours/day)     Assistance Recommended at Discharge Frequent or constant Supervision/Assistance  Equipment Recommendations  None recommended by PT    Recommendations for Other Services       Precautions / Restrictions Precautions Precautions: Fall Precaution Comments: incont     Mobility  Bed Mobility Overal bed mobility: Needs Assistance Bed Mobility: Supine to Sit     Supine to sit: Max assist;Total assist;+2 for physical assistance;+2 for safety/equipment     General bed mobility comments: Maximum Assist for trunk, bil LEs, and to scoot to EOB. Increased time. Cues required.    Transfers Overall transfer level: Needs assistance Equipment used: None Transfers: Sit to/from [facility] Sit to Stand: Max  assist;Total assist;+2 physical assistance;+2 safety/equipment           General transfer comment: + 2 Max/Toal Assist from elevated bed to [facility] with difficulty completing turn and hand over hand assist for proper hand placement.  Pt only 20% assisted.    Ambulation/Gait Ambulation/Gait assistance: Max assist;Total assist;+2 physical assistance;+2 safety/equipment Gait Distance (Feet): 3 Feet Assistive device: Rolling walker (2 wheels) Gait Pattern/deviations: Step-through pattern;Decreased stride length;Trunk flexed Gait velocity: decreased   General Gait Details: attempted forward amb + 2 assist with difficulty due to decreased arousal.  Poor standing balance. Recliner following closely behind.   Stairs             Wheelchair Mobility    Modified Rankin (Stroke Patients Only)       Balance                                            Cognition Arousal/Alertness: Lethargic Behavior During Therapy: Flat affect                                   General Comments: Minimally verbal this session.        Exercises      General Comments        Pertinent Vitals/Pain Pain Assessment: Faces Faces Pain Scale: Hurts a little bit Pain Location: buttocks red , tender , moisture    Home Living  Prior Function            PT Goals (current goals can now be found in the care plan section) Progress towards PT goals: Progressing toward goals    Frequency    Min 3X/week      PT Plan Current plan remains appropriate    Co-evaluation              AM-PAC PT "6 Clicks" Mobility   Outcome Measure  Help needed turning from your back to your side while in a flat bed without using bedrails?: A Lot Help needed moving from lying on your back to sitting on the side of a flat bed without using bedrails?: A Lot Help needed moving to and from a bed to a chair (including a wheelchair)?: A Lot Help  needed standing up from a chair using your arms (e.g., wheelchair or bedside chair)?: A Lot Help needed to walk in hospital room?: Total Help needed climbing 3-5 steps with a railing? : Total 6 Click Score: 10    End of Session Equipment Utilized During Treatment: Gait belt Activity Tolerance: Patient limited by fatigue Patient left: in chair;with call bell/phone within reach Nurse Communication: Mobility status PT Visit Diagnosis: Muscle weakness (generalized) (M62.81);Difficulty in walking, not elsewhere classified (R26.2)     Time: 0722-5750 PT Time Calculation (min) (ACUTE ONLY): 28 min  Charges:  $Gait Training: 8-22 mins $Therapeutic Activity: 8-22 mins                     {Krystyn Picking  PTA Acute  Rehabilitation Services Pager      [phone] Office      [phone]

## 2020-12-29 NOTE — Plan of Care (Signed)
  Problem: Education: Goal: Knowledge of General Education information will improve Description Including pain rating scale, medication(s)/side effects and non-pharmacologic comfort measures Outcome: Progressing   Problem: Clinical Measurements: Goal: Cardiovascular complication will be avoided Outcome: Progressing   Problem: Safety: Goal: Ability to remain free from injury will improve Outcome: Progressing   

## 2020-12-30 LAB — RESP PANEL BY RT-PCR (FLU A&B, COVID) ARPGX2
Influenza A by PCR: NEGATIVE
Influenza B by PCR: NEGATIVE
SARS Coronavirus 2 by RT PCR: NEGATIVE

## 2020-12-30 MED ORDER — LORATADINE 10 MG PO TABS: 10.0000 mg | ORAL_TABLET | Freq: Every day | ORAL | 1 refills | Status: AC

## 2020-12-30 MED ORDER — APIXABAN 2.5 MG PO TABS: 2.5000 mg | ORAL_TABLET | Freq: Two times a day (BID) | ORAL | 1 refills | Status: DC

## 2020-12-30 MED ORDER — LOSARTAN POTASSIUM 25 MG PO TABS: 12.5000 mg | ORAL_TABLET | Freq: Every day | ORAL | 1 refills | Status: DC

## 2020-12-30 MED ORDER — AMOXICILLIN-POT CLAVULANATE 875-125 MG PO TABS: 1.0000 | ORAL_TABLET | Freq: Two times a day (BID) | ORAL | 0 refills | Status: AC

## 2020-12-30 MED ORDER — FLUTICASONE PROPIONATE 50 MCG/ACT NA SUSP: 2.0000 | Freq: Every day | NASAL | 2 refills | Status: DC

## 2020-12-30 NOTE — Progress Notes (Signed)
[facility] called for transportation to [facility] facility and was informed that patient has 18 people ahead of her for transportation. Waiting to be picked up by [facility].

## 2020-12-30 NOTE — Discharge Summary (Signed)
Physician Discharge Summary  [name] GYJ:[id number] DOB: 06/25/1930 DOA: 12/20/2020  PCP: Lajean Manes, MD  Admit date: 12/20/2020 Discharge date: 12/30/2020  Admitted From: [facility] Disposition: [facility]  Recommendations for Outpatient Follow-up:  Follow up with PCP in 1-2 weeks Please obtain BMP/CBC in one week Encourage PO intake Follow-up chest x-ray in 4 weeks to follow resolution of pneumonia.   Home Health: None Equipment/Devices: None Discharge Condition: Stable CODE STATUS: Full code Diet recommendation: Cardiac Brief/Interim Summary:[name], 87 y.o. female with PMH of anemia, osteoarthritis, cancer, chronic diarrhea, COPD, depression, GERD, goiter, headache, hypertension, unspecified peripheral neuropathy, osteoporosis, history of UTI presented from assisted living facility with complaint of generalized weakness, diarrhea x3 days. In the ED vitals fairly stable, although had episode of tachycardia in 130s to 140s, given IV fluids, labs showed UA consistent with UTI, mild hypokalemia creatinine 1.2. Patient is kept on IV fluids and antibiotics and admitted  Discharge Diagnoses:  Principal Problem:   Acute lower UTI (urinary tract infection) Active Problems:   Hypertension   General weakness   Hypokalemia   Volume depletion   UTI (urinary tract infection)   Protein-calorie malnutrition, severe   Diarrhea   Paroxysmal atrial fibrillation (HCC)   CHF NYHA class I (no symptoms from ordinary activities), acute, systolic ([place])   #1 E. coli UTI patient finished her course of treatment with Rocephin/cefadroxil   #2 newly diagnosed heart failure-echo this admission shows ejection fraction 40 to 45% with global hypokinesis. Continue Coreg and ACE inhibitor outpatient follow-up with cardiology.   #3 history of essential hypertension continue Coreg at 12.5 mg twice a day and ACE inhibitor.  #4 chronic diarrhea stool negative for C. difficile.  She takes  opium tincture at home which manages her diarrhea continue.   #5 A. fib/a flutter with CHA2DS2-VASc score 4.  Patient was seen by cardiology.she was started on Coreg and Eliquis  #6 pneumonia finished a course of Augmentin patient is stable on room air.  Follow-up chest x-ray in 4 weeks.     Pressure Injury 12/20/18 Coccyx Medial Stage II -  Partial thickness loss of dermis presenting as a shallow open ulcer with a red, pink wound bed without slough. pink, open skin, non-blanchable (Active)  12/20/18 1415  Location: Coccyx  Location Orientation: Medial  Staging: Stage II -  Partial thickness loss of dermis presenting as a shallow open ulcer with a red, pink wound bed without slough.  Wound Description (Comments): pink, open skin, non-blanchable  Present on Admission: Yes      Nutrition Problem: Severe Malnutrition Etiology: chronic illness    Signs/Symptoms: severe fat depletion, severe muscle depletion     Interventions: Boost Breeze, MVI, Prostat  Estimated body mass index is 20.47 kg/m as calculated from the following:   Height as of this encounter: 5\' 5"  (1.651 m).   Weight as of this encounter: 55.8 kg.  Discharge Instructions  Discharge Instructions     Diet - low sodium heart healthy   Complete by: As directed    Increase activity slowly   Complete by: As directed       Allergies as of 12/30/2020       Reactions   Hydromorphone    Pt does not remember   Lisinopril    fatigue   Mirtazapine    Pt doesn't remember.     Nitrofuran Derivatives Diarrhea   Septra [bactrim]    Pt doesn't remember.    Sulfamethoxazole-trimethoprim  Medication List     STOP taking these medications    albuterol 108 (90 Base) MCG/ACT inhaler Commonly known as: VENTOLIN HFA   diphenoxylate-atropine 2.5-0.025 MG tablet Commonly known as: LOMOTIL   glycopyrrolate 1 MG tablet Commonly known as: ROBINUL       TAKE these medications    acetaminophen 650  MG CR tablet Commonly known as: TYLENOL Take 650 mg by mouth every 8 (eight) hours as needed for pain.   amLODipine 2.5 MG tablet Commonly known as: NORVASC Take 2.5 mg by mouth daily.   amoxicillin-clavulanate 875-125 MG tablet Commonly known as: Augmentin Take 1 tablet by mouth 2 (two) times daily for 2 days.   apixaban 2.5 MG Tabs tablet Commonly known as: ELIQUIS Take 1 tablet (2.5 mg total) by mouth 2 (two) times daily.   carvedilol 6.25 MG tablet Commonly known as: COREG Take 6.25 mg by mouth 2 (two) times daily.   feeding supplement Liqd Take 1 Container by mouth daily.   fluticasone 50 MCG/ACT nasal spray Commonly known as: FLONASE Place 2 sprays into both nostrils daily. Start taking on: December 31, 2020   loperamide 2 MG capsule Commonly known as: IMODIUM Take 1 capsule (2 mg total) by mouth 4 (four) times daily as needed for diarrhea or loose stools.   loratadine 10 MG tablet Commonly known as: CLARITIN Take 1 tablet (10 mg total) by mouth daily. Start taking on: December 31, 2020   multivitamin with minerals Tabs tablet Take 1 tablet by mouth daily.   Opium 10 MG/ML (1%) Tinc Take 0.6 mLs by mouth 4 (four) times daily as needed for diarrhea or loose stools.        Follow-up Information     Lajean Manes, MD .   Specialty: Internal Medicine Contact information: [address] [place] [address] [phone]                Allergies  Allergen Reactions   Hydromorphone     Pt does not remember   Lisinopril     fatigue   Mirtazapine     Pt doesn't remember.     Nitrofuran Derivatives Diarrhea   Septra [Bactrim]     Pt doesn't remember.    Sulfamethoxazole-Trimethoprim     Consultations: cardiology   Procedures/Studies: DG CHEST PORT 1 VIEW  Result Date: 12/28/2020 CLINICAL DATA:  Lethargy,  COPD EXAM: PORTABLE CHEST 1 VIEW COMPARISON:  01/22/2019 FINDINGS: Transverse diameter of heart is within normal  limits. Thoracic aorta is tortuous and ectatic. Low position of diaphragms suggests COPD. Infiltrate is seen in medial left lower lung fields. There is blunting of left lateral CP angle. Right lateral CP angle is clear. There is no pneumothorax. IMPRESSION: COPD. Infiltrate in left lower lung fields suggests atelectasis/pneumonia. Follow-up studies until complete clearing occurs should be considered. Small left pleural effusion. Electronically Signed   By: Elmer Picker M.D.   On: 12/28/2020 13:33   ECHOCARDIOGRAM COMPLETE  Result Date: 12/22/2020    ECHOCARDIOGRAM REPORT   Patient Name:   [name] Date of Exam: 12/22/2020 Medical Rec #:  [id number]   Height:       66.0 in Accession #:    [id number]  Weight:       113.0 lb Date of Birth:  [birth year]   BSA:          1.569 m Patient Age:    61 years    BP:  127/94 mmHg Patient Gender: F           HR:           106 bpm. Exam Location:  Outpatient Procedure: 2D Echo, Cardiac Doppler and Color Doppler Indications:    AFIB  History:        Patient has no prior history of Echocardiogram examinations.                 Risk Factors:Hypertension.  Sonographer:    Jyl Heinz Referring Phys: [id number] [place]  1. Moderate septal hypertrophy with otherwise mild concentric LVH. Left ventricular ejection fraction, by estimation, is 40 to 45%. The left ventricle has mildly decreased function. The left ventricle demonstrates global hypokinesis. There is moderate left ventricular hypertrophy. Left ventricular diastolic parameters are indeterminate. Elevated left ventricular end-diastolic pressure.  2. Right ventricular systolic function is normal. The right ventricular size is normal. There is mildly elevated pulmonary artery systolic pressure.  3. Left atrial size was mildly dilated.  4. The mitral valve is normal in structure. Mild mitral valve regurgitation. No evidence of mitral stenosis.  5. Tricuspid valve regurgitation is moderate.   6. The aortic valve is tricuspid. There is mild calcification of the aortic valve. There is mild thickening of the aortic valve. Aortic valve regurgitation is not visualized. No aortic stenosis is present.  7. The inferior vena cava is dilated in size with >50% respiratory variability, suggesting right atrial pressure of 8 mmHg. FINDINGS  Left Ventricle: Moderate septal hypertrophy with otherwise mild concentric LVH. Left ventricular ejection fraction, by estimation, is 40 to 45%. The left ventricle has mildly decreased function. The left ventricle demonstrates global hypokinesis. The left ventricular internal cavity size was normal in size. There is moderate left ventricular hypertrophy. Left ventricular diastolic parameters are indeterminate. Elevated left ventricular end-diastolic pressure. Right Ventricle: The right ventricular size is normal. No increase in right ventricular wall thickness. Right ventricular systolic function is normal. There is mildly elevated pulmonary artery systolic pressure. The tricuspid regurgitant velocity is 2.88  m/s, and with an assumed right atrial pressure of 8 mmHg, the estimated right ventricular systolic pressure is 13.0 mmHg. Left Atrium: Left atrial size was mildly dilated. Right Atrium: Right atrial size was normal in size. Pericardium: There is no evidence of pericardial effusion. Mitral Valve: The mitral valve is normal in structure. Mild mitral valve regurgitation. No evidence of mitral valve stenosis. Tricuspid Valve: The tricuspid valve is normal in structure. Tricuspid valve regurgitation is moderate . No evidence of tricuspid stenosis. Aortic Valve: The aortic valve is tricuspid. There is mild calcification of the aortic valve. There is mild thickening of the aortic valve. Aortic valve regurgitation is not visualized. No aortic stenosis is present. Aortic valve peak gradient measures 6.0 mmHg. Pulmonic Valve: The pulmonic valve was normal in structure. Pulmonic valve  regurgitation is trivial. No evidence of pulmonic stenosis. Aorta: The aortic root is normal in size and structure. Venous: The inferior vena cava is dilated in size with greater than 50% respiratory variability, suggesting right atrial pressure of 8 mmHg. IAS/Shunts: No atrial level shunt detected by color flow Doppler.  LEFT VENTRICLE PLAX 2D LVIDd:         3.40 cm     Diastology LVIDs:         3.00 cm     LV e' medial:    7.51 cm/s LV PW:         1.10 cm  LV E/e' medial:  16.2 LV IVS:        1.50 cm     LV e' lateral:   8.27 cm/s LVOT diam:     2.00 cm     LV E/e' lateral: 14.8 LV SV:         45 LV SV Index:   29 LVOT Area:     3.14 cm  LV Volumes (MOD) LV vol d, MOD A2C: 58.3 ml LV vol d, MOD A4C: 73.1 ml LV vol s, MOD A2C: 34.8 ml LV vol s, MOD A4C: 42.4 ml LV SV MOD A2C:     23.5 ml LV SV MOD A4C:     73.1 ml LV SV MOD BP:      27.9 ml RIGHT VENTRICLE             IVC RV Basal diam:  4.20 cm     IVC diam: 2.30 cm RV S prime:     12.50 cm/s TAPSE (M-mode): 2.0 cm LEFT ATRIUM             Index        RIGHT ATRIUM           Index LA diam:        3.50 cm 2.23 cm/m   RA Area:     16.10 cm LA Vol (A2C):   48.2 ml 30.72 ml/m  RA Volume:   42.70 ml  27.22 ml/m LA Vol (A4C):   39.0 ml 24.86 ml/m LA Biplane Vol: 44.5 ml 28.36 ml/m  AORTIC VALVE AV Area (Vmax): 2.07 cm AV Vmax:        122.00 cm/s AV Peak Grad:   6.0 mmHg LVOT Vmax:      80.20 cm/s LVOT Vmean:     56.450 cm/s LVOT VTI:       0.143 m  AORTA Ao Root diam: 3.40 cm Ao Asc diam:  3.40 cm MITRAL VALVE                TRICUSPID VALVE MV Area (PHT): 5.27 cm     TR Peak grad:   33.2 mmHg MV Decel Time: 144 msec     TR Vmax:        288.00 cm/s MR Peak grad: 101.6 mmHg MR Mean grad: 70.0 mmHg     SHUNTS MR Vmax:      504.00 cm/s   Systemic VTI:  0.14 m MR Vmean:     403.5 cm/s    Systemic Diam: 2.00 cm MV E velocity: 122.00 cm/s MV A velocity: 85.40 cm/s MV E/A ratio:  1.43 Skeet Latch MD Electronically signed by Skeet Latch MD Signature  Date/Time: 12/22/2020/6:06:35 PM    Final    (Echo, Carotid, EGD, Colonoscopy, ERCP)    Subjective: Patient is resting in bed awake and alert in no acute distress she denies any new complaints  Discharge Exam: Vitals:   12/30/20 0520 12/30/20 1055  BP: (!) 163/73 (!) 133/57  Pulse: 72 79  Resp: 19   Temp: 98 F (36.7 C)   SpO2: 91%    Vitals:   12/29/20 2156 12/30/20 0520 12/30/20 1055 12/30/20 1157  BP: 135/60 (!) 163/73 (!) 133/57   Pulse: 71 72 79   Resp: 17 19    Temp: 98.8 F (37.1 C) 98 F (36.7 C)    TempSrc: Oral Oral    SpO2: 95% 91%    Weight:    55.8 kg  Height:  General: Pt is alert, awake, not in acute distress Cardiovascular: RRR, S1/S2 +, no rubs, no gallops Respiratory: CTA bilaterally, no wheezing, no rhonchi Abdominal: Soft, NT, ND, bowel sounds + Extremities: no edema, no cyanosis    The results of significant diagnostics from this hospitalization (including imaging, microbiology, ancillary and laboratory) are listed below for reference.     Microbiology: Recent Results (from the past 240 hour(s))  Resp Panel by RT-PCR (Flu A&B, Covid) Nasopharyngeal Swab     Status: None   Collection Time: 12/21/20 10:54 AM   Specimen: Nasopharyngeal Swab; Nasopharyngeal(NP) swabs in vial transport medium  Result Value Ref Range Status   SARS Coronavirus 2 by RT PCR NEGATIVE NEGATIVE Final    Comment: (NOTE) SARS-CoV-2 target nucleic acids are NOT DETECTED.  The SARS-CoV-2 RNA is generally detectable in upper respiratory specimens during the acute phase of infection. The lowest concentration of SARS-CoV-2 viral copies this assay can detect is 138 copies/mL. A negative result does not preclude SARS-Cov-2 infection and should not be used as the sole basis for treatment or other patient management decisions. A negative result may occur with  improper specimen collection/handling, submission of specimen other than nasopharyngeal swab, presence of viral  mutation(s) within the areas targeted by this assay, and inadequate number of viral copies(<138 copies/mL). A negative result must be combined with clinical observations, patient history, and epidemiological information. The expected result is Negative.  Fact Sheet for Patients:  EntrepreneurPulse.com.au  Fact Sheet for Healthcare Providers:  IncredibleEmployment.be  This test is no t yet approved or cleared by the [place] FDA and  has been authorized for detection and/or diagnosis of SARS-CoV-2 by FDA under an Emergency Use Authorization (EUA). This EUA will remain  in effect (meaning this test can be used) for the duration of the COVID-19 declaration under Section 564(b)(1) of the Act, 21 U.S.C.section 360bbb-3(b)(1), unless the authorization is terminated  or revoked sooner.       Influenza A by PCR NEGATIVE NEGATIVE Final   Influenza B by PCR NEGATIVE NEGATIVE Final    Comment: (NOTE) The Xpert Xpress SARS-CoV-2/FLU/RSV plus assay is intended as an aid in the diagnosis of influenza from Nasopharyngeal swab specimens and should not be used as a sole basis for treatment. Nasal washings and aspirates are unacceptable for Xpert Xpress SARS-CoV-2/FLU/RSV testing.  Fact Sheet for Patients: EntrepreneurPulse.com.au  Fact Sheet for Healthcare Providers: IncredibleEmployment.be  This test is not yet approved or cleared by the [place] FDA and has been authorized for detection and/or diagnosis of SARS-CoV-2 by FDA under an Emergency Use Authorization (EUA). This EUA will remain in effect (meaning this test can be used) for the duration of the COVID-19 declaration under Section 564(b)(1) of the Act, 21 U.S.C. section 360bbb-3(b)(1), unless the authorization is terminated or revoked.  Performed at [facility], [place] [address], [place], [place] [address]   Urine Culture      Status: Abnormal   Collection Time: 12/21/20 11:07 AM   Specimen: Urine, Clean Catch  Result Value Ref Range Status   Specimen Description   Final    URINE, CLEAN CATCH Performed at [facility], [place] [address], [place], [place] [address]    Special Requests   Final    NONE Performed at [facility], [place] [address], [place], [place] [address]    Culture >=100,000 COLONIES/mL ESCHERICHIA COLI (A)  Final   Report Status 12/23/2020 FINAL  Final   Organism ID, Bacteria ESCHERICHIA  COLI (A)  Final      Susceptibility   Escherichia coli - MIC*    AMPICILLIN >=32 RESISTANT Resistant     CEFAZOLIN <=4 SENSITIVE Sensitive     CEFEPIME <=0.12 SENSITIVE Sensitive     CEFTRIAXONE <=0.25 SENSITIVE Sensitive     CIPROFLOXACIN <=0.25 SENSITIVE Sensitive     GENTAMICIN <=1 SENSITIVE Sensitive     IMIPENEM <=0.25 SENSITIVE Sensitive     NITROFURANTOIN 32 SENSITIVE Sensitive     TRIMETH/SULFA >=320 RESISTANT Resistant     AMPICILLIN/SULBACTAM >=32 RESISTANT Resistant     PIP/TAZO <=4 SENSITIVE Sensitive     * >=100,000 COLONIES/mL ESCHERICHIA COLI  Gastrointestinal Panel by PCR , Stool     Status: None   Collection Time: 12/21/20  4:19 PM   Specimen: Stool  Result Value Ref Range Status   Campylobacter species NOT DETECTED NOT DETECTED Final   Plesimonas shigelloides NOT DETECTED NOT DETECTED Final   Salmonella species NOT DETECTED NOT DETECTED Final   Yersinia enterocolitica NOT DETECTED NOT DETECTED Final   Vibrio species NOT DETECTED NOT DETECTED Final   Vibrio cholerae NOT DETECTED NOT DETECTED Final   Enteroaggregative E coli (EAEC) NOT DETECTED NOT DETECTED Final   Enteropathogenic E coli (EPEC) NOT DETECTED NOT DETECTED Final   Enterotoxigenic E coli (ETEC) NOT DETECTED NOT DETECTED Final   Shiga like toxin producing E coli (STEC) NOT DETECTED NOT DETECTED Final   Shigella/Enteroinvasive E coli (EIEC) NOT DETECTED NOT DETECTED Final    Cryptosporidium NOT DETECTED NOT DETECTED Final   Cyclospora cayetanensis NOT DETECTED NOT DETECTED Final   Entamoeba histolytica NOT DETECTED NOT DETECTED Final   Giardia lamblia NOT DETECTED NOT DETECTED Final   Adenovirus F40/41 NOT DETECTED NOT DETECTED Final   Astrovirus NOT DETECTED NOT DETECTED Final   Norovirus GI/GII NOT DETECTED NOT DETECTED Final   Rotavirus A NOT DETECTED NOT DETECTED Final   Sapovirus (I, II, IV, and V) NOT DETECTED NOT DETECTED Final    Comment: Performed at [facility], [place] [place], [place] [address]  C Difficile Quick Screen w PCR reflex     Status: None   Collection Time: 12/21/20  4:19 PM   Specimen: Stool  Result Value Ref Range Status   C Diff antigen NEGATIVE NEGATIVE Final   C Diff toxin NEGATIVE NEGATIVE Final   C Diff interpretation No C. difficile detected.  Final    Comment: Performed at [facility], [place] [address] [place],  [address]  Resp Panel by RT-PCR (Flu A&B, Covid) Nasopharyngeal Swab     Status: None   Collection Time: 12/27/20  5:50 AM   Specimen: Nasopharyngeal Swab; Nasopharyngeal(NP) swabs in vial transport medium  Result Value Ref Range Status   SARS Coronavirus 2 by RT PCR NEGATIVE NEGATIVE Final    Comment: (NOTE) SARS-CoV-2 target nucleic acids are NOT DETECTED.  The SARS-CoV-2 RNA is generally detectable in upper respiratory specimens during the acute phase of infection. The lowest concentration of SARS-CoV-2 viral copies this assay can detect is 138 copies/mL. A negative result does not preclude SARS-Cov-2 infection and should not be used as the sole basis for treatment or other patient management decisions. A negative result may occur with  improper specimen collection/handling, submission of specimen other than nasopharyngeal swab, presence of viral mutation(s) within the areas targeted by this assay, and inadequate number of viral copies(<138 copies/mL). A negative  result must be combined with clinical observations, patient history, and epidemiological information.  The expected result is Negative.  Fact Sheet for Patients:  EntrepreneurPulse.com.au  Fact Sheet for Healthcare Providers:  IncredibleEmployment.be  This test is no t yet approved or cleared by the [place] FDA and  has been authorized for detection and/or diagnosis of SARS-CoV-2 by FDA under an Emergency Use Authorization (EUA). This EUA will remain  in effect (meaning this test can be used) for the duration of the COVID-19 declaration under Section 564(b)(1) of the Act, 21 U.S.C.section 360bbb-3(b)(1), unless the authorization is terminated  or revoked sooner.       Influenza A by PCR NEGATIVE NEGATIVE Final   Influenza B by PCR NEGATIVE NEGATIVE Final    Comment: (NOTE) The Xpert Xpress SARS-CoV-2/FLU/RSV plus assay is intended as an aid in the diagnosis of influenza from Nasopharyngeal swab specimens and should not be used as a sole basis for treatment. Nasal washings and aspirates are unacceptable for Xpert Xpress SARS-CoV-2/FLU/RSV testing.  Fact Sheet for Patients: EntrepreneurPulse.com.au  Fact Sheet for Healthcare Providers: IncredibleEmployment.be  This test is not yet approved or cleared by the [place] FDA and has been authorized for detection and/or diagnosis of SARS-CoV-2 by FDA under an Emergency Use Authorization (EUA). This EUA will remain in effect (meaning this test can be used) for the duration of the COVID-19 declaration under Section 564(b)(1) of the Act, 21 U.S.C. section 360bbb-3(b)(1), unless the authorization is terminated or revoked.  Performed at [facility], [place] [address] [place], [place] [address]      Labs: BNP (last 3 results) No results for input(s): BNP in the last 8760 hours. Basic Metabolic Panel: Recent Labs  Lab 12/24/20 0842  12/25/20 0354 12/26/20 0338 12/27/20 0357 12/28/20 0353 12/29/20 0348  NA 135 131* 132* 134* 136 131*  K 4.1 4.2 4.3 4.3 3.8 4.4  CL 104 99 100 97* 102 99  CO2 25 25 26 27 26 25   GLUCOSE 99 111* 107* 129* 146* 116*  BUN 26* 34* 37* 48* 39* 48*  CREATININE 0.95 0.79 0.96 1.21* 0.95 0.95  CALCIUM 8.3* 8.2* 8.2* 8.5* 8.1* 8.0*  MG 2.0 1.9 1.9 2.1  --  2.3   Liver Function Tests: No results for input(s): AST, ALT, ALKPHOS, BILITOT, PROT, ALBUMIN in the last 168 hours. No results for input(s): LIPASE, AMYLASE in the last 168 hours. No results for input(s): AMMONIA in the last 168 hours. CBC: Recent Labs  Lab 12/24/20 0842 12/25/20 0354 12/27/20 0357 12/29/20 0348  WBC 7.3 7.6 7.7 9.9  NEUTROABS 5.4  --   --  7.5  HGB 10.9* 11.0* 9.9* 9.8*  HCT 34.0* 34.9* 31.4* 30.9*  MCV 92.9 92.8 92.4 93.4  PLT 225 270 321 378   Cardiac Enzymes: No results for input(s): CKTOTAL, CKMB, CKMBINDEX, TROPONINI in the last 168 hours. BNP: Invalid input(s): POCBNP CBG: No results for input(s): GLUCAP in the last 168 hours. D-Dimer No results for input(s): DDIMER in the last 72 hours. Hgb A1c No results for input(s): HGBA1C in the last 72 hours. Lipid Profile No results for input(s): CHOL, HDL, LDLCALC, TRIG, CHOLHDL, LDLDIRECT in the last 72 hours. Thyroid function studies No results for input(s): TSH, T4TOTAL, T3FREE, THYROIDAB in the last 72 hours.  Invalid input(s): FREET3 Anemia work up No results for input(s): VITAMINB12, FOLATE, FERRITIN, TIBC, IRON, RETICCTPCT in the last 72 hours. Urinalysis    Component Value Date/Time   COLORURINE YELLOW 12/21/2020 0615   APPEARANCEUR CLOUDY (A) 12/21/2020 0615   LABSPEC 1.014 12/21/2020 4782  PHURINE 5.0 12/21/2020 0615   GLUCOSEU NEGATIVE 12/21/2020 0615   HGBUR SMALL (A) 12/21/2020 0615   BILIRUBINUR NEGATIVE 12/21/2020 0615   KETONESUR NEGATIVE 12/21/2020 0615   PROTEINUR 100 (A) 12/21/2020 0615   UROBILINOGEN 0.2 10/30/2014 1728    NITRITE NEGATIVE 12/21/2020 0615   LEUKOCYTESUR LARGE (A) 12/21/2020 0615   Sepsis Labs Invalid input(s): PROCALCITONIN,  WBC,  LACTICIDVEN Microbiology Recent Results (from the past 240 hour(s))  Resp Panel by RT-PCR (Flu A&B, Covid) Nasopharyngeal Swab     Status: None   Collection Time: 12/21/20 10:54 AM   Specimen: Nasopharyngeal Swab; Nasopharyngeal(NP) swabs in vial transport medium  Result Value Ref Range Status   SARS Coronavirus 2 by RT PCR NEGATIVE NEGATIVE Final    Comment: (NOTE) SARS-CoV-2 target nucleic acids are NOT DETECTED.  The SARS-CoV-2 RNA is generally detectable in upper respiratory specimens during the acute phase of infection. The lowest concentration of SARS-CoV-2 viral copies this assay can detect is 138 copies/mL. A negative result does not preclude SARS-Cov-2 infection and should not be used as the sole basis for treatment or other patient management decisions. A negative result may occur with  improper specimen collection/handling, submission of specimen other than nasopharyngeal swab, presence of viral mutation(s) within the areas targeted by this assay, and inadequate number of viral copies(<138 copies/mL). A negative result must be combined with clinical observations, patient history, and epidemiological information. The expected result is Negative.  Fact Sheet for Patients:  EntrepreneurPulse.com.au  Fact Sheet for Healthcare Providers:  IncredibleEmployment.be  This test is no t yet approved or cleared by the [place] FDA and  has been authorized for detection and/or diagnosis of SARS-CoV-2 by FDA under an Emergency Use Authorization (EUA). This EUA will remain  in effect (meaning this test can be used) for the duration of the COVID-19 declaration under Section 564(b)(1) of the Act, 21 U.S.C.section 360bbb-3(b)(1), unless the authorization is terminated  or revoked sooner.       Influenza A by PCR  NEGATIVE NEGATIVE Final   Influenza B by PCR NEGATIVE NEGATIVE Final    Comment: (NOTE) The Xpert Xpress SARS-CoV-2/FLU/RSV plus assay is intended as an aid in the diagnosis of influenza from Nasopharyngeal swab specimens and should not be used as a sole basis for treatment. Nasal washings and aspirates are unacceptable for Xpert Xpress SARS-CoV-2/FLU/RSV testing.  Fact Sheet for Patients: EntrepreneurPulse.com.au  Fact Sheet for Healthcare Providers: IncredibleEmployment.be  This test is not yet approved or cleared by the [place] FDA and has been authorized for detection and/or diagnosis of SARS-CoV-2 by FDA under an Emergency Use Authorization (EUA). This EUA will remain in effect (meaning this test can be used) for the duration of the COVID-19 declaration under Section 564(b)(1) of the Act, 21 U.S.C. section 360bbb-3(b)(1), unless the authorization is terminated or revoked.  Performed at [facility], [place] [address] [place], [place] [address]   Urine Culture     Status: Abnormal   Collection Time: 12/21/20 11:07 AM   Specimen: Urine, Clean Catch  Result Value Ref Range Status   Specimen Description   Final    URINE, CLEAN CATCH Performed at [facility], [place] [address], [place], [place] [address]    Special Requests   Final    NONE Performed at [facility], [place] [address] [place], [place] [address]    Culture >=100,000 COLONIES/mL ESCHERICHIA COLI (A)  Final   Report Status 12/23/2020 FINAL  Final  Organism ID, Bacteria ESCHERICHIA COLI (A)  Final      Susceptibility   Escherichia coli - MIC*    AMPICILLIN >=32 RESISTANT Resistant     CEFAZOLIN <=4 SENSITIVE Sensitive     CEFEPIME <=0.12 SENSITIVE Sensitive     CEFTRIAXONE <=0.25 SENSITIVE Sensitive     CIPROFLOXACIN <=0.25 SENSITIVE Sensitive     GENTAMICIN <=1 SENSITIVE Sensitive     IMIPENEM <=0.25 SENSITIVE  Sensitive     NITROFURANTOIN 32 SENSITIVE Sensitive     TRIMETH/SULFA >=320 RESISTANT Resistant     AMPICILLIN/SULBACTAM >=32 RESISTANT Resistant     PIP/TAZO <=4 SENSITIVE Sensitive     * >=100,000 COLONIES/mL ESCHERICHIA COLI  Gastrointestinal Panel by PCR , Stool     Status: None   Collection Time: 12/21/20  4:19 PM   Specimen: Stool  Result Value Ref Range Status   Campylobacter species NOT DETECTED NOT DETECTED Final   Plesimonas shigelloides NOT DETECTED NOT DETECTED Final   Salmonella species NOT DETECTED NOT DETECTED Final   Yersinia enterocolitica NOT DETECTED NOT DETECTED Final   Vibrio species NOT DETECTED NOT DETECTED Final   Vibrio cholerae NOT DETECTED NOT DETECTED Final   Enteroaggregative E coli (EAEC) NOT DETECTED NOT DETECTED Final   Enteropathogenic E coli (EPEC) NOT DETECTED NOT DETECTED Final   Enterotoxigenic E coli (ETEC) NOT DETECTED NOT DETECTED Final   Shiga like toxin producing E coli (STEC) NOT DETECTED NOT DETECTED Final   Shigella/Enteroinvasive E coli (EIEC) NOT DETECTED NOT DETECTED Final   Cryptosporidium NOT DETECTED NOT DETECTED Final   Cyclospora cayetanensis NOT DETECTED NOT DETECTED Final   Entamoeba histolytica NOT DETECTED NOT DETECTED Final   Giardia lamblia NOT DETECTED NOT DETECTED Final   Adenovirus F40/41 NOT DETECTED NOT DETECTED Final   Astrovirus NOT DETECTED NOT DETECTED Final   Norovirus GI/GII NOT DETECTED NOT DETECTED Final   Rotavirus A NOT DETECTED NOT DETECTED Final   Sapovirus (I, II, IV, and V) NOT DETECTED NOT DETECTED Final    Comment: Performed at [facility], [address] [place], [place] [address]  C Difficile Quick Screen w PCR reflex     Status: None   Collection Time: 12/21/20  4:19 PM   Specimen: Stool  Result Value Ref Range Status   C Diff antigen NEGATIVE NEGATIVE Final   C Diff toxin NEGATIVE NEGATIVE Final   C Diff interpretation No C. difficile detected.  Final    Comment: Performed at [facility], [place] [address], [place], [place] [address]  Resp Panel by RT-PCR (Flu A&B, Covid) Nasopharyngeal Swab     Status: None   Collection Time: 12/27/20  5:50 AM   Specimen: Nasopharyngeal Swab; Nasopharyngeal(NP) swabs in vial transport medium  Result Value Ref Range Status   SARS Coronavirus 2 by RT PCR NEGATIVE NEGATIVE Final    Comment: (NOTE) SARS-CoV-2 target nucleic acids are NOT DETECTED.  The SARS-CoV-2 RNA is generally detectable in upper respiratory specimens during the acute phase of infection. The lowest concentration of SARS-CoV-2 viral copies this assay can detect is 138 copies/mL. A negative result does not preclude SARS-Cov-2 infection and should not be used as the sole basis for treatment or other patient management decisions. A negative result may occur with  improper specimen collection/handling, submission of specimen other than nasopharyngeal swab, presence of viral mutation(s) within the areas targeted by this assay, and inadequate number of viral copies(<138 copies/mL). A negative result must be combined with clinical observations, patient  history, and epidemiological information. The expected result is Negative.  Fact Sheet for Patients:  EntrepreneurPulse.com.au  Fact Sheet for Healthcare Providers:  IncredibleEmployment.be  This test is no t yet approved or cleared by the [place] FDA and  has been authorized for detection and/or diagnosis of SARS-CoV-2 by FDA under an Emergency Use Authorization (EUA). This EUA will remain  in effect (meaning this test can be used) for the duration of the COVID-19 declaration under Section 564(b)(1) of the Act, 21 U.S.C.section 360bbb-3(b)(1), unless the authorization is terminated  or revoked sooner.       Influenza A by PCR NEGATIVE NEGATIVE Final   Influenza B by PCR NEGATIVE NEGATIVE Final    Comment: (NOTE) The Xpert Xpress SARS-CoV-2/FLU/RSV plus  assay is intended as an aid in the diagnosis of influenza from Nasopharyngeal swab specimens and should not be used as a sole basis for treatment. Nasal washings and aspirates are unacceptable for Xpert Xpress SARS-CoV-2/FLU/RSV testing.  Fact Sheet for Patients: EntrepreneurPulse.com.au  Fact Sheet for Healthcare Providers: IncredibleEmployment.be  This test is not yet approved or cleared by the [place] FDA and has been authorized for detection and/or diagnosis of SARS-CoV-2 by FDA under an Emergency Use Authorization (EUA). This EUA will remain in effect (meaning this test can be used) for the duration of the COVID-19 declaration under Section 564(b)(1) of the Act, 21 U.S.C. section 360bbb-3(b)(1), unless the authorization is terminated or revoked.  Performed at [facility], [place] [address], [place], [place] [address]      Time coordinating discharge: 39 minutes  SIGNED:   Georgette Shell, MD  [facility] 12/30/2020, 12:29 PM

## 2020-12-30 NOTE — Progress Notes (Addendum)
[facility] was called [phone]) to verify pt's arrival. Spoke with [place] (LPN) who stated was aware of arrival. I gave her report and contact numbers for any queries. PTAR called to verify transport..........transportation was not set up at this time report given to PTAR and transportion finalized. PTAR said pt 18th on list. D/c package ready for PTAR. Pt updated and report given to oncoming nurse, Lorriane Shire.   Pt going to Rm # [phone].

## 2020-12-30 NOTE — Progress Notes (Signed)
Nutrition Follow-up  DOCUMENTATION CODES:   Severe malnutrition in context of chronic illness  INTERVENTION:  - continue Boost Breeze BID and 30 ml Prosource Plus BID.  - weigh patient today.    NUTRITION DIAGNOSIS:   Severe Malnutrition related to chronic illness as evidenced by severe fat depletion, severe muscle depletion. -ongoing  GOAL:   Patient will meet greater than or equal to 90% of their needs -minimally met on average  MONITOR:   PO intake, Supplement acceptance, Labs, Weight trends  ASSESSMENT:   87 y.o. female with medical history of anemia, osteoarthritis, cancer, chronic diarrhea, COPD, depression, GERD, goiter, headaches, HTN, unspecified peripheral neuropathy, and osteoporosis. She presented to the ED from ALF at [facility] due to generalized weakness and diarrhea x3 days.  She has been eating 25-100% (average of 50%) at meals over the past 4 days. She has been accepting Boost Breeze and Prosource 100% of the time offered over the past 1 week.  She has not been weighed since admission on 10/27.  Per notes: - she has finished abx for UTI - newly dx heart failure with plan for outpatient follow-up with Cardiology - chronic diarrhea, negative for cdiff    Labs reviewed; Na: 131 mmol/l, BUN: 48 mg/dl, Ca: 8 mg/dl, GFR: 58 ml/min. Medications reviewed; 1 tablet multivitamin with minerals/day.   Diet Order:   Diet Order             Diet Heart Room service appropriate? Yes; Fluid consistency: Thin  Diet effective now                   EDUCATION NEEDS:   Not appropriate for education at this time  Skin:  Skin Assessment: Reviewed RN Assessment  Last BM:  11/3 (type 6 x1)  Height:   Ht Readings from Last 1 Encounters:  12/23/20 _0  (1.651 m)    Weight:   Wt Readings from Last 1 Encounters:  12/23/20 51.7 kg     Estimated Nutritional Needs:  Kcal:  1400-1600 kcal Protein:  70-80 grams Fluid:  >/= 1.5 L/day     Jarome Matin, MS, RD, LDN, CNSC Inpatient Clinical Dietitian RD pager # available in [facility]  After hours/weekend pager # available in [facility]

## 2020-12-30 NOTE — Plan of Care (Signed)
  Problem: Education: Goal: Knowledge of General Education information will improve Description: Including pain rating scale, medication(s)/side effects and non-pharmacologic comfort measures Outcome: Progressing   Problem: Clinical Measurements: Goal: Ability to maintain clinical measurements within normal limits will improve Outcome: Progressing   Problem: Activity: Goal: Risk for activity intolerance will decrease Outcome: Progressing   Problem: Coping: Goal: Level of anxiety will decrease Outcome: Progressing   Problem: Safety: Goal: Ability to remain free from injury will improve Outcome: Progressing

## 2020-12-30 NOTE — Progress Notes (Signed)
Transition of Care [facility]) - Emergency Department Mini Assessment   Patient Details  Name: [name] MRN: [id number] Date of Birth: November 16, 1930  Transition of Care [facility]) CM/SW Contact:    [name], [place] Phone Number: 12/30/2020, 6:50 PM   Clinical Narrative: TOC CSW attempted to contact [name] at [facility] with no success.  CSW left HIPPA compliant message with my contact information.  CSW then contacted [name]/[place].  [name] stated she was aware of pts arrival.  CSW then reported her findings to [facility] to assist with pts dc to [facility].    [name], MSW, LCSW-A Pronouns:  She/Her/Hers [facility] HealthTransitions of Care Clinical Social Worker Direct Number:  [phone] [name]     ED Mini Assessment: What brought you to the Emergency Department? : Generalized Weakness  Barriers to Discharge: No Barriers Identified     Means of departure: Ambulance  Interventions which prevented an admission or readmission: SNF Placement    Patient Contact and Communications Key Contact 1: [name]/[place] Key Contact 2: [name]/[place] Spoke with: [name] Date: 12/30/20,     Contact Phone Number: [phone] and [phone] Call outcome: Pts dc plan is to dc to [facility].  Patient states their goals for this hospitalization and ongoing recovery are:: to get better CMS Medicare.gov Compare Post Acute Care list provided to:: Patient Choice offered to / list presented to : NA  Admission diagnosis:  Hypokalemia [E87.6] UTI (urinary tract infection) [N39.0] Acute lower UTI (urinary tract infection) [N39.0] Diarrhea, unspecified type [R19.7] Patient Active Problem List   Diagnosis Date Noted   Protein-calorie malnutrition, severe 12/23/2020   Diarrhea    Paroxysmal atrial fibrillation (HCC)    CHF NYHA class I (no symptoms from ordinary activities), acute, systolic ([place])    UTI (urinary  tract infection) 12/22/2020   Acute lower UTI (urinary tract infection) 12/21/2020   Hypokalemia 12/21/2020   Volume depletion 12/21/2020   Sepsis ([place]) 01/17/2019   Acute respiratory failure with hypoxia ([place]) 01/17/2019   Pneumonia due to COVID-19 virus 01/17/2019   Pulmonary nodule 01/17/2019   Thoracic aortic aneurysm 01/17/2019   Fall 12/21/2018   General weakness 12/20/2018   Pressure injury of skin 12/20/2018   Meningioma ([place]) 12/20/2018   AKI (acute kidney injury) ([place]) 12/20/2018   Fall at home, initial encounter 04/19/2017   Bilateral hip dislocation ([place]) 04/19/2017   Uncontrolled pain 04/19/2017   Hypertension 04/19/2017   Leukocytosis 04/19/2017   Elevated troponin 04/19/2017   Osteoarthritis of hand 01/01/2016   Osteoarthritis of foot 01/01/2016   Unspecified osteoarthritis, unspecified site 01/01/2016   S/p reverse total shoulder arthroplasty 08/27/2014   Chronic diarrhea 12/22/2011   PCP:  Rachael Manes, MD Pharmacy:   [facility], [place] [address] Phone: [phone] Fax: [phone]

## 2020-12-31 DIAGNOSIS — N390 Urinary tract infection, site not specified: Secondary | ICD-10-CM | POA: Diagnosis not present

## 2020-12-31 DIAGNOSIS — Z87891 Personal history of nicotine dependence: Secondary | ICD-10-CM | POA: Diagnosis not present

## 2020-12-31 DIAGNOSIS — E43 Unspecified severe protein-calorie malnutrition: Secondary | ICD-10-CM | POA: Diagnosis not present

## 2020-12-31 DIAGNOSIS — W19XXXA Unspecified fall, initial encounter: Secondary | ICD-10-CM | POA: Diagnosis not present

## 2020-12-31 DIAGNOSIS — D6869 Other thrombophilia: Secondary | ICD-10-CM | POA: Diagnosis not present

## 2020-12-31 DIAGNOSIS — R404 Transient alteration of awareness: Secondary | ICD-10-CM | POA: Diagnosis not present

## 2020-12-31 DIAGNOSIS — I509 Heart failure, unspecified: Secondary | ICD-10-CM | POA: Diagnosis not present

## 2020-12-31 DIAGNOSIS — Z7401 Bed confinement status: Secondary | ICD-10-CM | POA: Diagnosis not present

## 2020-12-31 DIAGNOSIS — G709 Myoneural disorder, unspecified: Secondary | ICD-10-CM | POA: Diagnosis not present

## 2020-12-31 DIAGNOSIS — I110 Hypertensive heart disease with heart failure: Secondary | ICD-10-CM | POA: Diagnosis not present

## 2020-12-31 DIAGNOSIS — I480 Paroxysmal atrial fibrillation: Secondary | ICD-10-CM | POA: Diagnosis not present

## 2020-12-31 DIAGNOSIS — I10 Essential (primary) hypertension: Secondary | ICD-10-CM | POA: Diagnosis not present

## 2020-12-31 DIAGNOSIS — R2689 Other abnormalities of gait and mobility: Secondary | ICD-10-CM | POA: Diagnosis not present

## 2020-12-31 DIAGNOSIS — R2681 Unsteadiness on feet: Secondary | ICD-10-CM | POA: Diagnosis not present

## 2020-12-31 DIAGNOSIS — Z743 Need for continuous supervision: Secondary | ICD-10-CM | POA: Diagnosis not present

## 2020-12-31 DIAGNOSIS — R262 Difficulty in walking, not elsewhere classified: Secondary | ICD-10-CM | POA: Diagnosis not present

## 2020-12-31 DIAGNOSIS — K529 Noninfective gastroenteritis and colitis, unspecified: Secondary | ICD-10-CM | POA: Diagnosis not present

## 2020-12-31 DIAGNOSIS — M6281 Muscle weakness (generalized): Secondary | ICD-10-CM | POA: Diagnosis not present

## 2020-12-31 DIAGNOSIS — I5021 Acute systolic (congestive) heart failure: Secondary | ICD-10-CM | POA: Diagnosis not present

## 2020-12-31 DIAGNOSIS — J449 Chronic obstructive pulmonary disease, unspecified: Secondary | ICD-10-CM | POA: Diagnosis not present

## 2020-12-31 DIAGNOSIS — I4892 Unspecified atrial flutter: Secondary | ICD-10-CM | POA: Diagnosis not present

## 2020-12-31 DIAGNOSIS — R5381 Other malaise: Secondary | ICD-10-CM | POA: Diagnosis not present

## 2020-12-31 DIAGNOSIS — G629 Polyneuropathy, unspecified: Secondary | ICD-10-CM | POA: Diagnosis not present

## 2020-12-31 DIAGNOSIS — E049 Nontoxic goiter, unspecified: Secondary | ICD-10-CM | POA: Diagnosis not present

## 2020-12-31 DIAGNOSIS — K219 Gastro-esophageal reflux disease without esophagitis: Secondary | ICD-10-CM | POA: Diagnosis not present

## 2020-12-31 DIAGNOSIS — D649 Anemia, unspecified: Secondary | ICD-10-CM | POA: Diagnosis not present

## 2020-12-31 DIAGNOSIS — Z7901 Long term (current) use of anticoagulants: Secondary | ICD-10-CM | POA: Diagnosis not present

## 2020-12-31 DIAGNOSIS — M810 Age-related osteoporosis without current pathological fracture: Secondary | ICD-10-CM | POA: Diagnosis not present

## 2020-12-31 NOTE — Plan of Care (Signed)
Pt alert, no c/o of pain, no s/s of respiratory distress, O2 sat 94% RA, VSS and IV site Dc'd and WDL. Pt discharged to [facility] via EMS PT[facility] and discharge packet given.   Goal: Knowledge of General Education information will improve Description: Including pain rating scale, medication(s)/side effects and non-pharmacologic comfort measures Outcome: Adequate for Discharge   Problem: Health Behavior/Discharge Planning: Goal: Ability to manage health-related needs will improve Outcome: Adequate for Discharge   Problem: Clinical Measurements: Goal: Ability to maintain clinical measurements within normal limits will improve Outcome: Adequate for Discharge Goal: Will remain free from infection Outcome: Adequate for Discharge Goal: Diagnostic test results will improve Outcome: Adequate for Discharge Goal: Respiratory complications will improve Outcome: Adequate for Discharge Goal: Cardiovascular complication will be avoided Outcome: Adequate for Discharge   Problem: Activity: Goal: Risk for activity intolerance will decrease Outcome: Adequate for Discharge   Problem: Nutrition: Goal: Adequate nutrition will be maintained Outcome: Adequate for Discharge   Problem: Coping: Goal: Level of anxiety will decrease Outcome: Adequate for Discharge   Problem: Elimination: Goal: Will not experience complications related to bowel motility Outcome: Adequate for Discharge Goal: Will not experience complications related to urinary retention Outcome: Adequate for Discharge   Problem: Pain Managment: Goal: General experience of comfort will improve Outcome: Adequate for Discharge   Problem: Safety: Goal: Ability to remain free from injury will improve Outcome: Adequate for Discharge   Problem: Skin Integrity: Goal: Risk for impaired skin integrity will decrease Outcome: Adequate for Discharge

## 2021-01-06 ENCOUNTER — Ambulatory Visit (HOSPITAL_COMMUNITY)
Admit: 2021-01-06 | Discharge: 2021-01-06 | Disposition: A | Discharge status: Home / Self Care | Payer: Medicare Other | Attending: Physician Assistant | Admitting: Physician Assistant

## 2021-01-06 ENCOUNTER — Encounter (HOSPITAL_COMMUNITY): Payer: Self-pay | Admitting: Physician Assistant

## 2021-01-06 VITALS — BP 122/72 | HR 71 | Ht 65.0 in | Wt 112.4 lb

## 2021-01-06 DIAGNOSIS — R5381 Other malaise: Secondary | ICD-10-CM | POA: Diagnosis not present

## 2021-01-06 DIAGNOSIS — Z87891 Personal history of nicotine dependence: Secondary | ICD-10-CM | POA: Diagnosis not present

## 2021-01-06 DIAGNOSIS — I110 Hypertensive heart disease with heart failure: Secondary | ICD-10-CM | POA: Diagnosis not present

## 2021-01-06 DIAGNOSIS — I509 Heart failure, unspecified: Secondary | ICD-10-CM | POA: Diagnosis not present

## 2021-01-06 DIAGNOSIS — I480 Paroxysmal atrial fibrillation: Secondary | ICD-10-CM | POA: Diagnosis not present

## 2021-01-06 DIAGNOSIS — Z7901 Long term (current) use of anticoagulants: Secondary | ICD-10-CM | POA: Diagnosis not present

## 2021-01-06 DIAGNOSIS — J449 Chronic obstructive pulmonary disease, unspecified: Secondary | ICD-10-CM | POA: Diagnosis not present

## 2021-01-06 DIAGNOSIS — I5021 Acute systolic (congestive) heart failure: Secondary | ICD-10-CM | POA: Diagnosis not present

## 2021-01-06 DIAGNOSIS — K529 Noninfective gastroenteritis and colitis, unspecified: Secondary | ICD-10-CM | POA: Diagnosis not present

## 2021-01-06 DIAGNOSIS — I4892 Unspecified atrial flutter: Secondary | ICD-10-CM | POA: Diagnosis not present

## 2021-01-06 DIAGNOSIS — E43 Unspecified severe protein-calorie malnutrition: Secondary | ICD-10-CM | POA: Diagnosis not present

## 2021-01-06 DIAGNOSIS — D6869 Other thrombophilia: Secondary | ICD-10-CM | POA: Diagnosis not present

## 2021-01-06 NOTE — Progress Notes (Signed)
Primary Care Physician: Lajean Manes, MD Primary Cardiologist: Dr Sallyanne Kuster Primary Electrophysiologist: none Referring Physician: Dr Jamison Neighbor is a 87 y.o. female with a history of COPD, HTN, atrial flutter, HFrEF, h/o colectomy for bleeding, atrial fibrillation who presents for consultation in the [facility].  The patient was presented to [facility] 12/21/20 with three days of diarrhea and worsening weakness. She states her opium tincture did not come in the mail and she has been having worsening diarrhea. She fell on Sunday twice due to lightheadedness. She has had reduced PO intake. On arrival, labs consistent with dehydration and positive for UTI. She was admitted for ABX and deconditioning. She was waiting a bed in the hallway and pulse noted in the 140s. She was moved to a monitored bed and telemetry showed irregular tachycardia. Cardiology was consulted. She converted back to SR spontaneously. Echo showed EF 40-45%, her carvedilol was increased. Patient is on Eliquis for a CHADS2VASC score of 5.  Today, she reports that she feel well. She is back on her opium tincture and her her diarrhea is controlled. She is in SR. She denies any bleeding issues on anticoagulation.   Today, she denies symptoms of palpitations, chest pain, shortness of breath, orthopnea, PND, lower extremity edema, dizziness, presyncope, syncope, snoring, daytime somnolence, bleeding, or neurologic sequela. The patient is tolerating medications without difficulties and is otherwise without complaint today.    Atrial Fibrillation Risk Factors:  she does not have symptoms or diagnosis of sleep apnea. she does not have a history of rheumatic fever.   she has a BMI of Body mass index is 18.7 kg/m.Marland Kitchen Filed Weights   01/06/21 1113  Weight: 51 kg    Family History  Problem Relation Age of Onset   Heart attack Mother    Stroke Father      Atrial Fibrillation Management  history:  Previous antiarrhythmic drugs: none Previous cardioversions: none Previous ablations: none CHADS2VASC score: 5 Anticoagulation history: Eliquis   Past Medical History:  Diagnosis Date   Anemia    Arthritis    Cancer ([place])    melanoma   Chronic diarrhea    Complication of anesthesia    hallucinations in the past, last surgery was OK anesth.    COPD (chronic obstructive pulmonary disease) ([place])    Depression    sad she cannot travel & do art like she use to.   GERD (gastroesophageal reflux disease)    Goiter    Headache(784.0)    sinus   History of blood transfusion 2006   due to colon bleeding    Hypertension    Neuromuscular disorder (HCC)    peripheral neuropathy - both feet    Osteoporosis    Shortness of breath dyspnea    UTI (lower urinary tract infection)    start Tx today /w probiotic- 08/18/2014    Past Surgical History:  Procedure Laterality Date   ABDOMINAL HYSTERECTOMY     APPENDECTOMY     BALLOON DILATION  04/19/2011   Procedure: BALLOON DILATION;  Surgeon: Landry Dyke, MD;  Location: [facility];  Service: Endoscopy;  Laterality: N/A;   BLADDER SURGERY     COLON SURGERY  2006   Dr. Zella Richer- due to diverticulitis   ESOPHAGOGASTRODUODENOSCOPY  04/19/2011   Procedure: ESOPHAGOGASTRODUODENOSCOPY (EGD);  Surgeon: Landry Dyke, MD;  Location: [facility];  Service: Endoscopy;  Laterality: N/A;  mac    hip replacement-bilateral     JOINT REPLACEMENT  REVERSE SHOULDER ARTHROPLASTY Left 08/27/2014   Procedure: REVERSE LEFT SHOULDER ARTHROPLASTY;  Surgeon: Justice Britain, MD;  Location: [facility];  Service: Orthopedics;  Laterality: Left;   TOTAL KNEE ARTHROPLASTY Bilateral     Current Outpatient Medications  Medication Sig Dispense Refill   acetaminophen (TYLENOL) 650 MG CR tablet Take 650 mg by mouth every 8 (eight) hours as needed for pain.     amLODipine (NORVASC) 2.5 MG tablet Take 1 tablet by mouth daily.     apixaban (ELIQUIS) 2.5 MG  TABS tablet Take 1 tablet (2.5 mg total) by mouth 2 (two) times daily. 60 tablet 1   carvedilol (COREG) 6.25 MG tablet Take 6.25 mg by mouth 2 (two) times daily.     feeding supplement (BOOST HIGH PROTEIN) LIQD Take 1 Container by mouth daily.     fluticasone (FLONASE) 50 MCG/ACT nasal spray Place 2 sprays into both nostrils daily. 1 mL 2   loperamide (IMODIUM) 2 MG capsule Take 1 capsule (2 mg total) by mouth 4 (four) times daily as needed for diarrhea or loose stools. 40 capsule 0   loratadine (CLARITIN) 10 MG tablet Take 1 tablet (10 mg total) by mouth daily. 30 tablet 1   Multiple Vitamin (MULTIVITAMIN WITH MINERALS) TABS tablet Take 1 tablet by mouth daily. 30 tablet 0   Opium 10 MG/ML (1%) TINC Take 0.6 mLs by mouth 4 (four) times daily as needed for diarrhea or loose stools.     No current facility-administered medications for this encounter.    Allergies  Allergen Reactions   Amoxicillin-Pot Clavulanate Diarrhea    diarrhea   Cephalexin Diarrhea   Ciprofloxacin Diarrhea   Hydromorphone     Pt does not remember   Lisinopril     fatigue   Mirtazapine     Pt doesn't remember.     Nitrofuran Derivatives Diarrhea   Septra [Bactrim]     Pt doesn't remember.    Sulfamethoxazole-Trimethoprim     Social History   Socioeconomic History   Marital status: Divorced    Spouse name: Not on file   Number of children: Not on file   Years of education: Not on file   Highest education level: Not on file  Occupational History   Not on file  Tobacco Use   Smoking status: Former    Types: Cigarettes    Quit date: 02/28/1968    Years since quitting: 52.8   Smokeless tobacco: Never   Tobacco comments:    Former smoker 01/06/2021  Substance and Sexual Activity   Alcohol use: Not Currently    Comment: rarely   Drug use: No   Sexual activity: Not on file  Other Topics Concern   Not on file  Social History Narrative   Not on file   Social Determinants of Health   Financial  Resource Strain: Not on file  Food Insecurity: Not on file  Transportation Needs: Not on file  Physical Activity: Not on file  Stress: Not on file  Social Connections: Not on file  Intimate Partner Violence: Not on file     ROS- All systems are reviewed and negative except as per the HPI above.  Physical Exam: Vitals:   01/06/21 1113  BP: 122/72  Pulse: 71  Weight: 51 kg  Height: _0  (1.651 m)    GEN- The patient is a well appearing elderly female, alert and oriented x 3 today.   Head- normocephalic, atraumatic Eyes-  Sclera clear, conjunctiva pink Ears- hearing intact  Oropharynx- clear Neck- supple  Lungs- Clear to ausculation bilaterally, normal work of breathing Heart- Regular rate and rhythm, no murmurs, rubs or gallops  GI- soft, NT, ND, + BS Extremities- no clubbing, cyanosis, or edema MS- no significant deformity or atrophy Skin- no rash or lesion Psych- euthymic mood, full affect Neuro- strength and sensation are intact  Wt Readings from Last 3 Encounters:  01/06/21 51 kg  12/30/20 55.8 kg  01/17/19 51.3 kg    EKG today demonstrates  SR, PVC Vent. rate 71 BPM PR interval 188 ms QRS duration 86 ms QT/QTcB 406/441 ms  Echo 12/22/20 demonstrated   1. Moderate septal hypertrophy with otherwise mild concentric LVH. Left ventricular ejection fraction, by estimation, is 40 to 45%. The left  ventricle has mildly decreased function. The left ventricle demonstrates global hypokinesis. There is moderate left ventricular hypertrophy. Left ventricular diastolic parameters are indeterminate. Elevated left ventricular end-diastolic pressure.   2. Right ventricular systolic function is normal. The right ventricular  size is normal. There is mildly elevated pulmonary artery systolic  pressure.   3. Left atrial size was mildly dilated.   4. The mitral valve is normal in structure. Mild mitral valve  regurgitation. No evidence of mitral stenosis.   5. Tricuspid valve  regurgitation is moderate.   6. The aortic valve is tricuspid. There is mild calcification of the  aortic valve. There is mild thickening of the aortic valve. Aortic valve  regurgitation is not visualized. No aortic stenosis is present.   7. The inferior vena cava is dilated in size with >50% respiratory  variability, suggesting right atrial pressure of 8 mmHg.   Epic records are reviewed at length today  CHA2DS2-VASc Score = 5  The patient's score is based upon: CHF History: 1 HTN History: 1 Diabetes History: 0 Stroke History: 0 Vascular Disease History: 0 Age Score: 2 Gender Score: 1       ASSESSMENT AND PLAN: 1. Paroxysmal Atrial Fibrillation (ICD10:  I48.0) The patient's CHA2DS2-VASc score is 5, indicating a 7.2% annual risk of stroke.   Patient in [place] today. Continue carvedilol 6.25 mg BID Continue Eliquis 2.5 mg BID (age, weight) We discussed the role of ILR today. Patient and son are agreeable to implant, will arrange with Dr Sallyanne Kuster.   2. Secondary Hypercoagulable State (ICD10:  D68.69) The patient is at significant risk for stroke/thromboembolism based upon her CHA2DS2-VASc Score of 5.  Continue Apixaban (Eliquis).   3. Systolic dysfunction EF 77-41% on echo, possible tachycardia mediated.  Plan to recheck in 2 months, will order today. No signs or symptoms of fluid overload.  4. HTN Stable, no changes today.   Follow up with Dr Sallyanne Kuster.    [facility] [address] [place], [place] [address] [phone] 01/06/2021 11:29 AM

## 2021-01-07 ENCOUNTER — Telehealth: Payer: Self-pay | Admitting: *Deleted

## 2021-01-07 NOTE — Telephone Encounter (Signed)
Attempted to reach the patient to discuss a possible loop recorder implant. The mailbox was full.

## 2021-01-11 NOTE — Telephone Encounter (Signed)
Spoke to the patient about her loop recorder implant. It has been scheduled for 11/28.     [facility] [address], [place]  [place], [place] [address]  Phone: [phone] Fax: [phone]   You are scheduled for a Loop Recorder Implant on 11/28  at 2:40 pm with Dr. Sallyanne Kuster. This will take place at [employer], [address].    Please arrive at your appointment 15-20 minutes early.   You do not need to be fasting.   The procedure is performed with local anesthesia. You will not receive sedatives nor will an IV be placed.   Wash your chest and neck with the surgical soap the evening before and the morning of your procedure. Please following the washing instructions provided.   As with all surgical implants, there is a small risk of infection. If an infection occurs, the device will be removed. To help reduce the risk, please use the surgical scrub provided. Additional antiseptic precautions will be taken at the time of the procedure.   Please bring your insurance cards.   *Please note that scheduled loop recorder implants may need to be rescheduled if Dr. Sallyanne Kuster has a procedure urgently added on at the hospital   Preparing for the Procedure  Before the procedure, you can play an important role. Because skin is not sterile, your skin needs to be as free of germs as possible. You can reduce the number of germs on your skin by washing with CHG (chlorhexidine gluconate) Soap before the procedure. CHG is an antiseptic cleaner which kills germs and bonds with the skin to continue killing germs even after washing.  Please do not use if you have an allergy to CHG or antibacterial soaps. If your skin becomes reddened/irritated, STOP using the CHG.  DO NOT SHAVE (including legs and underarms) for at least 48 hours prior to first CHG shower. It is OK to shave your face.  Please follow these instructions carefully: Shower the night before the  procedure and the morning of with CHG Soap. If you chose to wash your hair, wash your hair first as usual with your normal shampoo/conditioner. After you shampoo/condition, rinse you hair and body thoroughly to remove shampoo/conditioner. Use CHG as you would any other liquid soap. You can apply CHG directly to the skin and wash gently with a loofah or a clean washcloth. Apply the CHG Soap to your body ONLY FROM THE NECK DOWN. Do not use on open wounds or open sores. Avoid contact with your eyes, ears, mouth, and genitals (private parts).  Wash thoroughly, paying special attention to the area where your surgery will be performed. Thoroughly rinse your body with warm water from the neck down. DO NOT shower/wash with your normal soap after using and rinsing off the CHG Soap. Pat yourself dry with a clean towel. Wear clean pajamas to bed. Place clean sheets on your bed the night of your first shower and do not sleep with pets..  Day of Surgery: Shower with the CHG Soap following the instructions listed above. DO NOT apply deodorants or lotions.

## 2021-01-12 DIAGNOSIS — I480 Paroxysmal atrial fibrillation: Secondary | ICD-10-CM | POA: Diagnosis not present

## 2021-01-12 DIAGNOSIS — R5381 Other malaise: Secondary | ICD-10-CM | POA: Diagnosis not present

## 2021-01-12 DIAGNOSIS — I509 Heart failure, unspecified: Secondary | ICD-10-CM | POA: Diagnosis not present

## 2021-01-12 DIAGNOSIS — E43 Unspecified severe protein-calorie malnutrition: Secondary | ICD-10-CM | POA: Diagnosis not present

## 2021-01-19 ENCOUNTER — Telehealth: Payer: Self-pay | Admitting: *Deleted

## 2021-01-19 NOTE — Telephone Encounter (Signed)
Spoke with patient about her loop recorder procedure scheduled for 11/28. She stated that she would need transportation from [facility] to bring her.   Call placed to [facility] at transportation. He stated that they would have to charge her $50 since it is not within the 72 hour mark. He stated that since it is a holiday she has missed the 3 day notice. Explained the importance of the procedure but he stated that this was the policy and she would have to pay the $50.   Called and spoke with the patient again and discussed the $50 charge. It has been decided that this is too much for her to pay to get a ride for the procedure. We will find another date and call her back .

## 2021-01-19 NOTE — Telephone Encounter (Signed)
Spoke with the patient. Procedure rescheduled for 12/12 at 12:30. Patient has verbalized her understanding.

## 2021-01-24 ENCOUNTER — Telehealth: Payer: Self-pay

## 2021-01-24 ENCOUNTER — Ambulatory Visit: Payer: Medicare Other | Admitting: Cardiovascular Disease

## 2021-01-24 DIAGNOSIS — M6281 Muscle weakness (generalized): Secondary | ICD-10-CM | POA: Diagnosis not present

## 2021-01-24 NOTE — Telephone Encounter (Signed)
(  4:35 pm) SW scheduled initial visit with patient. RN/SW is scheduled to see patient  on 01/28/21 @ 11am.

## 2021-01-25 DIAGNOSIS — R262 Difficulty in walking, not elsewhere classified: Secondary | ICD-10-CM | POA: Diagnosis not present

## 2021-01-25 DIAGNOSIS — M6281 Muscle weakness (generalized): Secondary | ICD-10-CM | POA: Diagnosis not present

## 2021-01-26 DIAGNOSIS — M6281 Muscle weakness (generalized): Secondary | ICD-10-CM | POA: Diagnosis not present

## 2021-01-28 ENCOUNTER — Other Ambulatory Visit: Payer: Self-pay

## 2021-01-28 ENCOUNTER — Other Ambulatory Visit: Payer: Medicare Other | Admitting: *Deleted

## 2021-01-28 ENCOUNTER — Other Ambulatory Visit: Payer: Medicare Other

## 2021-01-28 DIAGNOSIS — Z515 Encounter for palliative care: Secondary | ICD-10-CM

## 2021-01-28 DIAGNOSIS — M6281 Muscle weakness (generalized): Secondary | ICD-10-CM | POA: Diagnosis not present

## 2021-01-28 DIAGNOSIS — R262 Difficulty in walking, not elsewhere classified: Secondary | ICD-10-CM | POA: Diagnosis not present

## 2021-01-28 NOTE — Progress Notes (Signed)
COMMUNITY PALLIATIVE CARE SW NOTE  PATIENT NAME: [name] DOB: September 18, 1930 MRN: [id number]  PRIMARY CARE PROVIDER: Lajean Manes, MD  RESPONSIBLE PARTY:  Acct ID - Guarantor Home Phone Work Phone Relationship Acct Type  [id number] [name] [phone]  Self P/F     [place], [place] [address]     PLAN OF CARE and INTERVENTIONS:             GOALS OF CARE/ ADVANCE CARE PLANNING:  Goal is for patient to remain in her independent apartment.  SOCIAL/EMOTIONAL/SPIRITUAL ASSESSMENT/ INTERVENTIONS:  SW and RN-[name] completed an initial visit with patient at the facility. Patient was present in the dinning room. The team briefly introduce themselves to patient. Patient denied pain and stated that she decided to come down for lunch today. She reported she was open to ongoing visits and support by the palliative care team. Patient has a Actuary through [employer] [facility] [employer] for four hours a day. That sitter reports that patient has mild confusion. She is scheduled to have a pacemaker put in next week. Patient is independent for all ADL's. Her appetite is fair, but her diet includes a BOOST supplement. The team will follow-up with patient following her pacemaker procedure. SW provided supportive presence, observation, consult with sitter and reassurance of support.   PATIENT/CAREGIVER EDUCATION/ COPING: Patient appears to be coping well.  PERSONAL EMERGENCY PLAN:  Per facility protocol.  COMMUNITY RESOURCES COORDINATION/ HEALTH CARE NAVIGATION:  Patient has a Actuary through [employer] [facility] [employer]. FINANCIAL/LEGAL CONCERNS/INTERVENTIONS:  None.     SOCIAL HX:  Social History   Tobacco Use   Smoking status: Former    Types: Cigarettes    Quit date: 02/28/1968    Years since quitting: 52.9   Smokeless tobacco: Never   Tobacco comments:    Former smoker 01/06/2021  Substance Use Topics   Alcohol use: Not Currently    Comment: rarely    CODE STATUS: To be  assessed ADVANCED DIRECTIVES: No MOST FORM COMPLETE:  No HOSPICE EDUCATION PROVIDED: No  PPS: Patient is alert and oriented x3 with mild cognitive deficits. Patient independent for ADL's, but have a sitter daily.   Duration of visit and documentation: 60 minutes.  [address] [place], [place]

## 2021-01-28 NOTE — Progress Notes (Signed)
[facility] COMMUNITY PALLIATIVE CARE RN NOTE  PATIENT NAME: [name] DOB: 1930-04-22 MRN: [id number]  PRIMARY CARE PROVIDER: Lajean Manes, MD  RESPONSIBLE PARTY:  Acct ID - Guarantor Home Phone Work Phone Relationship Acct Type  [id number] PRINCES, FINGER [phone]  Self P/F     [place], [place], [place] [address]   Covid-19 Pre-screening Negative  PLAN OF CARE and INTERVENTION:  ADVANCE CARE PLANNING/GOALS OF CARE: Goal is for patient to remain at current facility, [facility].  PATIENT/CAREGIVER EDUCATION: Explained palliative care services DISEASE STATUS: Joint visit made with LCSW, M. Lonon. Met with patient in the dining room and hired caregiver in the common area. Patient had just went into the dining room for lunch. She denies any pain or discomfort at this time and reports feeling fine. She does have some intermittent confusion and forgetfulness at times, but able to answer questions appropriately. Spoke with hired caregiver through [employer] [facility]. She is with patient daily during the week from 11a-3p. She says that patient is independent with ADLs. She is basically there for companionship and if patient does require any assistance. They noticed that she had started having issues managing her own medications. Hired caregiver says that their nurse did visit patient and is going to start filling a pill box to assist with medication management. Her appetite is fair and she drinks Ensure for nutritional supplementation. She is going to have a pacemaker placed next week. Patient is agreeable to future visits with palliative care.   HISTORY OF PRESENT ILLNESS:  This is a 87 yo female with a diagnosis of CHF. She has a history of hypertension, afib, thoracic aortic aneurysm, meningioma, osteoarthritis and chronic diarrhea. Palliative care team was asked to follow patient to assist with symptom management, goals of care and complex decision making.    CODE  STATUS: Full code ADVANCED DIRECTIVES: N MOST FORM: no PPS: 60%   (Duration of visit and documentation 30 minutes)   Daryl Eastern, RN BSN

## 2021-01-31 DIAGNOSIS — M6281 Muscle weakness (generalized): Secondary | ICD-10-CM | POA: Diagnosis not present

## 2021-01-31 DIAGNOSIS — R262 Difficulty in walking, not elsewhere classified: Secondary | ICD-10-CM | POA: Diagnosis not present

## 2021-02-01 DIAGNOSIS — R260 Ataxic gait: Secondary | ICD-10-CM | POA: Diagnosis not present

## 2021-02-01 DIAGNOSIS — R202 Paresthesia of skin: Secondary | ICD-10-CM | POA: Diagnosis not present

## 2021-02-01 DIAGNOSIS — N390 Urinary tract infection, site not specified: Secondary | ICD-10-CM | POA: Diagnosis not present

## 2021-02-01 DIAGNOSIS — M6281 Muscle weakness (generalized): Secondary | ICD-10-CM | POA: Diagnosis not present

## 2021-02-01 DIAGNOSIS — I5020 Unspecified systolic (congestive) heart failure: Secondary | ICD-10-CM | POA: Diagnosis not present

## 2021-02-01 DIAGNOSIS — R54 Age-related physical debility: Secondary | ICD-10-CM | POA: Diagnosis not present

## 2021-02-02 ENCOUNTER — Telehealth: Payer: Self-pay

## 2021-02-02 DIAGNOSIS — M6281 Muscle weakness (generalized): Secondary | ICD-10-CM | POA: Diagnosis not present

## 2021-02-02 NOTE — Telephone Encounter (Signed)
(  3:20 pm) SW scheduled a follow-up visit with patient's son after not being able to reach patient. RN/SW scheduled to see patient on 02/11/21 @ 12 pm. Patient is scheduled to have a loop recorder implanted  on 12/12; palliative care to follow-up after this procedure.

## 2021-02-03 DIAGNOSIS — R262 Difficulty in walking, not elsewhere classified: Secondary | ICD-10-CM | POA: Diagnosis not present

## 2021-02-03 DIAGNOSIS — M6281 Muscle weakness (generalized): Secondary | ICD-10-CM | POA: Diagnosis not present

## 2021-02-04 DIAGNOSIS — M6281 Muscle weakness (generalized): Secondary | ICD-10-CM | POA: Diagnosis not present

## 2021-02-07 ENCOUNTER — Ambulatory Visit: Payer: Medicare Other | Admitting: Cardiovascular Disease

## 2021-02-07 ENCOUNTER — Telehealth: Payer: Self-pay

## 2021-02-07 ENCOUNTER — Other Ambulatory Visit: Payer: Self-pay

## 2021-02-07 DIAGNOSIS — R262 Difficulty in walking, not elsewhere classified: Secondary | ICD-10-CM | POA: Diagnosis not present

## 2021-02-07 DIAGNOSIS — Z95818 Presence of other cardiac implants and grafts: Secondary | ICD-10-CM

## 2021-02-07 DIAGNOSIS — M6281 Muscle weakness (generalized): Secondary | ICD-10-CM | POA: Diagnosis not present

## 2021-02-07 MED ORDER — LIDOCAINE-EPINEPHRINE 2 %-1:100000 IJ SOLN: 10.0000 mL | Freq: Once | INTRAMUSCULAR | Status: AC

## 2021-02-07 NOTE — Patient Instructions (Signed)
Discharge Instructions for  Loop Recorder Explant/Implant    Follow up: Keep your wound check appointment on 02/18/21 at 10:40. This will be a video visit only.   If you have any questions or concerns, please call the office at [phone].  ACTIVITY No restrictions. DO wear your seatbelt, even if it crosses over the site.   WOUND CARE Keep the wound area clean and dry.  Remove the dressing the day after (usually 24 hours after the procedure). DO NOT SUBMERGE UNDER WATER UNTIL FULLY HEALED (no tub baths, hot tubs, swimming pools, etc.).  You  may shower or take a sponge bath after the dressing is removed. DO NOT SOAK the area and do not allow the shower to directly spray on the site. If you have tape/steri-strips on your wound, these will fall off; do not pull them off prematurely.   No bandage is needed on the site.  DO  NOT apply any creams, oils, or ointments to the wound area. If you notice any drainage or discharge from the wound, any swelling, excessive redness or bruising at the site, or if you develop a fever > 101? F, call the office at once at [phone].

## 2021-02-07 NOTE — Progress Notes (Signed)
Primary Care Physician: Lajean Manes, MD Primary Cardiologist: Dr Sallyanne Kuster Primary Electrophysiologist: none Referring Physician: Dr Jamison Neighbor is a 87 y.o. female with a history of COPD, HTN, atrial flutter, HFrEF, h/o colectomy for bleeding, atrial fibrillation who presents for consultation in the [facility].  The patient was presented to [facility] 12/21/20 with three days of diarrhea and worsening weakness. She states her opium tincture did not come in the mail and she has been having worsening diarrhea. She fell on Sunday twice due to lightheadedness. She has had reduced PO intake. On arrival, labs consistent with dehydration and positive for UTI. She was admitted for ABX and deconditioning. She was waiting a bed in the hallway and pulse noted in the 140s. She was moved to a monitored bed and telemetry showed irregular tachycardia. Cardiology was consulted. She converted back to SR spontaneously. Echo showed EF 40-45%, her carvedilol was increased. Patient is on Eliquis for a CHADS2VASC score of 5.  Today, she reports that she feel well. She is back on her opium tincture and her her diarrhea is controlled. She is in SR. She denies any bleeding issues on anticoagulation.   Today, she denies symptoms of palpitations, chest pain, shortness of breath, orthopnea, PND, lower extremity edema, dizziness, presyncope, syncope, snoring, daytime somnolence, bleeding, or neurologic sequela. The patient is tolerating medications without difficulties and is otherwise without complaint today.    Atrial Fibrillation Risk Factors:  she does not have symptoms or diagnosis of sleep apnea. she does not have a history of rheumatic fever.   she has a BMI of There is no height or weight on file to calculate BMI.. There were no vitals filed for this visit.   Family History  Problem Relation Age of Onset   Heart attack Mother    Stroke Father      Atrial  Fibrillation Management history:  Previous antiarrhythmic drugs: none Previous cardioversions: none Previous ablations: none CHADS2VASC score: 5 Anticoagulation history: Eliquis   Past Medical History:  Diagnosis Date   Anemia    Arthritis    Cancer ([place])    melanoma   Chronic diarrhea    Complication of anesthesia    hallucinations in the past, last surgery was OK anesth.    COPD (chronic obstructive pulmonary disease) ([place])    Depression    sad she cannot travel & do art like she use to.   GERD (gastroesophageal reflux disease)    Goiter    Headache(784.0)    sinus   History of blood transfusion 2006   due to colon bleeding    Hypertension    Neuromuscular disorder (H[facility])    peripheral neuropathy - both feet    Osteoporosis    Shortness of breath dyspnea    UTI (lower urinary tract infection)    start Tx today /w probiotic- 08/18/2014    Past Surgical History:  Procedure Laterality Date   ABDOMINAL HYSTERECTOMY     APPENDECTOMY     BALLOON DILATION  04/19/2011   Procedure: BALLOON DILATION;  Surgeon: Landry Dyke, MD;  Location: [facility];  Service: Endoscopy;  Laterality: N/A;   BLADDER SURGERY     COLON SURGERY  2006   Dr. Zella Richer- due to diverticulitis   ESOPHAGOGASTRODUODENOSCOPY  04/19/2011   Procedure: ESOPHAGOGASTRODUODENOSCOPY (EGD);  Surgeon: Landry Dyke, MD;  Location: [facility];  Service: Endoscopy;  Laterality: N/A;  mac    hip replacement-bilateral  JOINT REPLACEMENT     REVERSE SHOULDER ARTHROPLASTY Left 08/27/2014   Procedure: REVERSE LEFT SHOULDER ARTHROPLASTY;  Surgeon: Justice Britain, MD;  Location: [place];  Service: Orthopedics;  Laterality: Left;   TOTAL KNEE ARTHROPLASTY Bilateral     Current Outpatient Medications  Medication Sig Dispense Refill   acetaminophen (TYLENOL) 650 MG CR tablet Take 650 mg by mouth every 8 (eight) hours as needed for pain.     amLODipine (NORVASC) 2.5 MG tablet Take 1 tablet by mouth daily.      apixaban (ELIQUIS) 2.5 MG TABS tablet Take 1 tablet (2.5 mg total) by mouth 2 (two) times daily. 60 tablet 1   carvedilol (COREG) 6.25 MG tablet Take 6.25 mg by mouth 2 (two) times daily.     feeding supplement (BOOST HIGH PROTEIN) LIQD Take 1 Container by mouth daily.     fluticasone (FLONASE) 50 MCG/ACT nasal spray Place 2 sprays into both nostrils daily. 1 mL 2   loperamide (IMODIUM) 2 MG capsule Take 1 capsule (2 mg total) by mouth 4 (four) times daily as needed for diarrhea or loose stools. 40 capsule 0   loratadine (CLARITIN) 10 MG tablet Take 1 tablet (10 mg total) by mouth daily. 30 tablet 1   Multiple Vitamin (MULTIVITAMIN WITH MINERALS) TABS tablet Take 1 tablet by mouth daily. 30 tablet 0   Opium 10 MG/ML (1%) TINC Take 0.6 mLs by mouth 4 (four) times daily as needed for diarrhea or loose stools.     Current Facility-Administered Medications  Medication Dose Route Frequency Provider Last Rate Last Admin   lidocaine-EPINEPHrine (XYLOCAINE W/EPI) 2 %-1:100000 (with pres) injection 10 mL  10 mL Infiltration Once Gavriel Holzhauer, MD        Allergies  Allergen Reactions   Amoxicillin-Pot Clavulanate Diarrhea    diarrhea   Cephalexin Diarrhea   Ciprofloxacin Diarrhea   Hydromorphone     Pt does not remember   Lisinopril     fatigue   Mirtazapine     Pt doesn't remember.     Nitrofuran Derivatives Diarrhea   Septra [Bactrim]     Pt doesn't remember.    Sulfamethoxazole-Trimethoprim     Social History   Socioeconomic History   Marital status: Divorced    Spouse name: Not on file   Number of children: Not on file   Years of education: Not on file   Highest education level: Not on file  Occupational History   Not on file  Tobacco Use   Smoking status: Former    Types: Cigarettes    Quit date: 02/28/1968    Years since quitting: 52.9   Smokeless tobacco: Never   Tobacco comments:    Former smoker 01/06/2021  Substance and Sexual Activity   Alcohol use: Not Currently     Comment: rarely   Drug use: No   Sexual activity: Not on file  Other Topics Concern   Not on file  Social History Narrative   Not on file   Social Determinants of Health   Financial Resource Strain: Not on file  Food Insecurity: Not on file  Transportation Needs: Not on file  Physical Activity: Not on file  Stress: Not on file  Social Connections: Not on file  Intimate Partner Violence: Not on file     ROS- All systems are reviewed and negative except as per the HPI above.  Physical Exam: There were no vitals filed for this visit.   GEN- The patient is a well appearing  elderly female, alert and oriented x 3 today.   Head- normocephalic, atraumatic Eyes-  Sclera clear, conjunctiva pink Ears- hearing intact Oropharynx- clear Neck- supple  Lungs- Clear to ausculation bilaterally, normal work of breathing Heart- Regular rate and rhythm, no murmurs, rubs or gallops  GI- soft, NT, ND, + BS Extremities- no clubbing, cyanosis, or edema MS- no significant deformity or atrophy Skin- no rash or lesion Psych- euthymic mood, full affect Neuro- strength and sensation are intact  Wt Readings from Last 3 Encounters:  01/06/21 112 lb 6.4 oz (51 kg)  12/30/20 123 lb 0.3 oz (55.8 kg)  01/17/19 113 lb (51.3 kg)    EKG today demonstrates  SR, PVC Vent. rate 71 BPM PR interval 188 ms QRS duration 86 ms QT/QTcB 406/441 ms  Echo 12/22/20 demonstrated   1. Moderate septal hypertrophy with otherwise mild concentric LVH. Left ventricular ejection fraction, by estimation, is 40 to 45%. The left  ventricle has mildly decreased function. The left ventricle demonstrates global hypokinesis. There is moderate left ventricular hypertrophy. Left ventricular diastolic parameters are indeterminate. Elevated left ventricular end-diastolic pressure.   2. Right ventricular systolic function is normal. The right ventricular  size is normal. There is mildly elevated pulmonary artery systolic   pressure.   3. Left atrial size was mildly dilated.   4. The mitral valve is normal in structure. Mild mitral valve  regurgitation. No evidence of mitral stenosis.   5. Tricuspid valve regurgitation is moderate.   6. The aortic valve is tricuspid. There is mild calcification of the  aortic valve. There is mild thickening of the aortic valve. Aortic valve  regurgitation is not visualized. No aortic stenosis is present.   7. The inferior vena cava is dilated in size with >50% respiratory  variability, suggesting right atrial pressure of 8 mmHg.   Epic records are reviewed at length today  CHA2DS2-VASc Score = 5  The patient's score is based upon: CHF History: 1 HTN History: 1 Diabetes History: 0 Stroke History: 0 Vascular Disease History: 0 Age Score: 2 Gender Score: 1       ASSESSMENT AND PLAN: 1. Paroxysmal Atrial Fibrillation (ICD10:  I48.0) The patient's CHA2DS2-VASc score is 5, indicating a 7.2% annual risk of stroke.   Patient in [place] today. Continue carvedilol 6.25 mg BID Continue Eliquis 2.5 mg BID (age, weight) We discussed the role of ILR today. Patient and son are agreeable to implant, will arrange with Dr Sallyanne Kuster.   2. Secondary Hypercoagulable State (ICD10:  D68.69) The patient is at significant risk for stroke/thromboembolism based upon her CHA2DS2-VASc Score of 5.  Continue Apixaban (Eliquis).   3. Systolic dysfunction EF 33-29% on echo, possible tachycardia mediated.  Plan to recheck in 2 months, will order today. No signs or symptoms of fluid overload.  4. HTN Stable, no changes today.   Follow up with Dr Sallyanne Kuster.    [facility] [address] [place], [place] [address] [phone] 02/07/2021 2:39 PM   She presents today for loop recorder implantation for atrial fibrillation management. This procedure has been fully reviewed with the patient and written informed consent has been obtained.  Sanda Klein, MD, [facility] [phone] office [phone] pager

## 2021-02-07 NOTE — Telephone Encounter (Signed)
(  4:00 pm) Patient called to confirm her appointment for Friday. SW confirmed that team  to visit patient on 02/11/21 @ 12 pm.

## 2021-02-07 NOTE — Progress Notes (Signed)
LOOP RECORDER IMPLANT   Procedure report  Procedure performed:  Loop recorder implantation   Reason for procedure:  1. Recurrent syncope/near-syncope 2. Cryptogenic stroke Procedure performed by:  Sanda Klein, MD  Complications:  None  Estimated blood loss:  <5 mL  Medications administered during procedure:  Lidocaine 1% with 1/100,000 epinephrine 10 mL locally Device details:  Medtronic Reveal Linq model number [id number], serial number [id number] Procedure details:  After the risks and benefits of the procedure were discussed the patient provided informed consent. The patient was prepped and draped in usual sterile fashion. Local anesthesia was administered to an area 2 cm to the left of the sternum in the 4th intercostal space. A cutaneous incision was made using the incision tool. The introducer was then used to create a subcutaneous tunnel and carefully deploy the device. Local pressure was held to ensure hemostasis.  The incision was closed with SteriStrips and a sterile dressing was applied.  R waves 0.9 mV  Sanda Klein, MD, [facility] [phone] office [phone] pager 02/07/2021 2:38 PM

## 2021-02-08 ENCOUNTER — Telehealth: Payer: Self-pay | Admitting: Cardiovascular Disease

## 2021-02-08 DIAGNOSIS — M6281 Muscle weakness (generalized): Secondary | ICD-10-CM | POA: Diagnosis not present

## 2021-02-08 NOTE — Telephone Encounter (Signed)
Patient had ILR impant on yesterday, 12/13, with Dr. Sallyanne Kuster. She states she removed the bandage 24 hours later, as instructed, and it was bloody at site of incision. She would like to know what to do going forward. Does she need to apply new bandage? Please advise. Contacted device clinic for general advisement. Was advised to contact Dr. Victorino December nurse as his aftercare instructions may be different. Please return call to patient when able.

## 2021-02-08 NOTE — Telephone Encounter (Signed)
Returned call to patient, who was calling in regard to her dressing on her ILR site. Patient states she took off the fist layer of gauze and states that there is blood underneath that layer. Confirmed that Steri strips are still in place. Patient reports that the blood is dark- like old blood. No new bright red blood per patient. The dressing has not soaked through. Patient reports no swelling, excessive redness or bruising around site. Advised patient to continue to monitor site, and not to remove steri strips that they will fall off on their own. Patient is aware of all instructions and verbalized understanding. Will forward to MD to make aware.

## 2021-02-09 ENCOUNTER — Ambulatory Visit: Payer: Medicare Other | Admitting: Physician Assistant

## 2021-02-09 DIAGNOSIS — M6281 Muscle weakness (generalized): Secondary | ICD-10-CM | POA: Diagnosis not present

## 2021-02-09 DIAGNOSIS — R262 Difficulty in walking, not elsewhere classified: Secondary | ICD-10-CM | POA: Diagnosis not present

## 2021-02-10 DIAGNOSIS — M6281 Muscle weakness (generalized): Secondary | ICD-10-CM | POA: Diagnosis not present

## 2021-02-10 DIAGNOSIS — R262 Difficulty in walking, not elsewhere classified: Secondary | ICD-10-CM | POA: Diagnosis not present

## 2021-02-11 ENCOUNTER — Other Ambulatory Visit: Payer: Self-pay

## 2021-02-11 ENCOUNTER — Other Ambulatory Visit: Payer: Medicare Other

## 2021-02-11 ENCOUNTER — Other Ambulatory Visit: Payer: Medicare Other | Admitting: *Deleted

## 2021-02-11 VITALS — BP 98/68

## 2021-02-11 DIAGNOSIS — M6281 Muscle weakness (generalized): Secondary | ICD-10-CM | POA: Diagnosis not present

## 2021-02-11 DIAGNOSIS — Z515 Encounter for palliative care: Secondary | ICD-10-CM

## 2021-02-11 NOTE — Progress Notes (Signed)
COMMUNITY PALLIATIVE CARE SW NOTE  PATIENT NAME: Rachael [name] [name] DOB: Jun 15, 1930 MRN: [id number]  PRIMARY CARE PROVIDER: Lajean Manes, MD  RESPONSIBLE PARTY:  Acct ID - Guarantor Home Phone Work Phone Relationship Acct Type  [id number] Rachael [name], [name] [phone]  Self P/F     [place], [place] [address]     PLAN OF CARE and INTERVENTIONS:             GOALS OF CARE/ ADVANCE CARE PLANNING:  Goal is for patient to remain in her home. Patient is a Full Code. SOCIAL/EMOTIONAL/SPIRITUAL ASSESSMENT/ INTERVENTIONS:  SW, RN-M. [place] and NP-K. [place] visited with patient at her independent apartment. Her son Rachael [name] [name] was present with her. Patient was sitting in a chair. She was awake, alert and is oriented x3. Some forgetfulness noted. Patient had a lube recorder put in on Monday. Patient reported that she has not experienced any pain related to the procedure or since her procedure. Patient is scheduled to have a video appointment with her doctor on 12/23, in which her sitter will go with her. Patient reported that she has had some weight loss overall. She report that she tries to eat at least 2 meals a day, which includes a Boost for breakfast. She reported swelling to her feet, which the team observed during this visit. Patient contributed the swelling to increase sodium intake. Patient wears a life alert button for increased  safety. Her son reported that patient seems more alert as she was experiencing some lethargy before. Patient has incontinence of bowel and bladder. She wears depends and pads. Patient report that sometimes she does not realize that she is urinating or having diarrhea spirts. Patient is receiving PT/OT 2-3 x week through [employer]. Patient meds was reviewed and it was recommended that patient use a pill box to ensure patient is taking her medications appropriately. Patient's son live in [place] and visits every couple of months. He was provided education regarding  palliative care services, which he was open to ongoing services. The palliative care team to continue to provide ongoing palliative care visits and support. PATIENT/CAREGIVER EDUCATION/ COPING:  Patient appears to be coping well. Patient has private pay caregivers. PERSONAL EMERGENCY PLAN:  911 can be accessed for emergencies. Patient also wears a Life Alert button. COMMUNITY RESOURCES COORDINATION/ HEALTH CARE NAVIGATION:  Patient is receiving PT/OT through [facility]. Patient also receives private caregiver support through [facility]. FINANCIAL/LEGAL CONCERNS/INTERVENTIONS:  None.     SOCIAL HX:  Social History   Tobacco Use   Smoking status: Former    Types: Cigarettes    Quit date: 02/28/1968    Years since quitting: 52.9   Smokeless tobacco: Never   Tobacco comments:    Former smoker 01/06/2021  Substance Use Topics   Alcohol use: Not Currently    Comment: rarely    CODE STATUS: Full Code ADVANCED DIRECTIVES: No MOST FORM COMPLETE:  No HOSPICE EDUCATION PROVIDED: No  PPS: Patient is alert and oriented x3 with some forgetfulness. Patient ambulates with a rolator. Patient is dependent for ADL's.  Duration of visit and documentation: 90 minutes.  [address] [place], [place]

## 2021-02-12 ENCOUNTER — Other Ambulatory Visit: Payer: Self-pay

## 2021-02-12 NOTE — Addendum Note (Signed)
Addended by: Damaris Hippo on: 02/12/2021 02:13 AM   Modules accepted: Level of Service

## 2021-02-12 NOTE — Progress Notes (Addendum)
[facility] Palliative Care Consult Note Telephone: [phone]  Fax: [phone]   Date of encounter: 02/12/21 12:12 AM Late entry for Palliative initial visit 02/11/21 at 1300:  PATIENT NAME: [name] [address] [name] [address]   [phone] (home)  DOB: Oct 14, 1930 MRN: [id number] PRIMARY CARE PROVIDER:    Lajean Manes, MD,  [name] [address] [address] [phone]  REFERRING PROVIDER:   Lajean Manes, MD [address] [place] [address] [name],  [name] [address] [phone]  RESPONSIBLE PARTY:    Contact Information     Name Relation Home Work [name] [phone]  [phone]   Rachael, [name] Relative [phone]  [phone]   [name] [phone]          I met face to face with patient and [name] [name] in [name] at [name]. Palliative Care was asked to follow this patient by consultation request of  Rachael Manes, MD to address advance care planning and complex medical decision making. This is the initial visit.                                     ASSESSMENT/SYMPTOM MANAGEMENT PLAN / RECOMMENDATIONS:  Paroxysmal atrial fibrillation (PAF) s/p loop recorder placement 02/07/2021 with EP Cardiology follow up by [name].  Uncomplicated healing.  Currently RRR, rate controlled.  Agree with reduced dose Eliquis 2.5 mg BID per age/renal function/fall risk. Educated on signs/symptoms of infection (none present) and to allow steristrips to fall off. Keep scheduled telephonic follow up with Cardiology. Heart Failure with reduced ejection fraction (EF) -As of 12/22/20 EF 40-45% with left ventricle (LV) global hypokinesis.  Continue Losartan, would recommend d/c of hydrochlorothiazide and increase Losartan dose in small increments every 1-2 weeks until BP 140/90 or less.   Hypertension (HTN)-not controlled.  Continue Amlodipine 5 mg daily  at bedtime.  Would recommend to increase Losartan to 25 mg daily (increase in 12.5 mg increments every 1-2 weeks until BP < 140/90. Discontinue HCTZ with risks for dehydration.   4.   CKD stage 3a-Last Cr 12/29/20 was 0.95 with eGFR 58         and chronically elevated BUN 48, likely due to        dehydration with poor oral hydration, diuretic therapy        and GI losses with chronic diarrhea.   5.   Ascending thoracic aorta measures 4.1 x 4.1 cm as of       01/17/19. Recommend getting BP controlled at < 140/90  6.  Chronic Diarrhea-Likely secondary to short gut      syndrome as pt is s/p remote colectomy. Follows with         GI. Continue to avoid irritant foods, glycopyrralate 1 mg       3 times daily before meals and Immodium 4 mg BID       Before meals. 7.  Protein Calorie malnutrition-noted atrophy of muscles in all 4 extremities.  Last Albumin 3.1 as of 12/21/20.  Encourage increased intake of lean protein and supplement with Boost. 8.  Osteoarthritis-s/p bilat hip and knee replacements, left total shoulder.  Continue Tylenol arthritis every 8 hours.  9.  Mixed urinary incontinence-already on glycopyrralate for chronic diarrhea which will have some anticholinergic properties. Has had follow up with [name] with [name] and  unaware of outcome. Will attempt follow up with [name] to obtain recommendations.        Encouraged pt not to hold urine, to attempt voiding       every 3-4 hours while awake to attempt bladder training,       increase water intake.  Advance Care Planning/Goals of Care: Will address at next visit CODE STATUS  Full      Follow up Palliative Care Visit: Palliative care will continue to follow for complex medical decision making, advance care planning, and clarification of goals. Return 4 weeks or prn.  I spent 40 minutes providing this consultation. More than 50% of the time in this consultation was spent in counseling and care coordination.  This  visit was coded based on medical decision making (MDM).  PPS: 60%  HOSPICE ELIGIBILITY/DIAGNOSIS: TBD  Chief Complaint: "I don't completely empty my bladder and I'm incontinent."  HISTORY OF PRESENT ILLNESS:  [name] is a 87 y.o. year old female with chronic systolic heart failure, HTN, ascending thoracic aneurysm, CKD stage 3a, PAF, protein calorie malnutrition and severe OA.  She has a hx of having had a colectomy with resultant chronic diarrhea.  She has poor intake of fluids, diminishing appetite and gradual weight loss from 129 lbs 13.6 ounces as of 12/21/20 to current weight 112 lbs 6.4 ounces. She has had an episode of sepsis, UTI, pneumonia and resultant AFIB RVR. She has a new diagnosis of acute systolic heart failure and had a loop recorder implanted. States severe OA in right shoulder and "I need surgery on it but I am not going to have it done." In November of 2020 pt was noted to have a 4 cm ascending thoracic aneurysm.  History obtained from review of EMR, discussion with [name] [name] and/or [name].  I reviewed available labs, medications, imaging, studies and related documents from the EMR.  Records reviewed and summarized above.   ROS  General: NAD EYES: denies difficulty reading small print on med bottles ENMT: denies dysphagia Cardiovascular: denies chest pain, denies DOE.  Notes dependent edema in feet and lower legs which resolves overnight Pulmonary: denies cough, denies increased SOB Abdomen: endorses fair appetite, denies constipation, endorses occasional incontinence of bowel GU: denies dysuria, endorses incontinence of urine MSK:  denies weakness,  no falls reported Skin: denies rashes.  Denies pain or drainage from surgical implanted loop recorder site Neurological: denies pain, denies insomnia Psych: Endorses positive mood Heme/lymph/immuno: denies bruises, abnormal bleeding  Physical Exam: Current and past weights: 129 lbs 13.6 ounces 12/2018, currently  112 lbs 6.4 ounces Constitutional: NAD General: frail appearing, thin and WD female EYES: anicteric sclera, lids intact, no discharge  ENMT: intact hearing, oral mucous membranes moist, dentition intact CV: RRR, rate controlled, 1+ pedal edema bilaterally, cool periphery Pulmonary: CTAB no increased work of breathing, no cough, room air Abdomen: intake </=50%, normo-active BS + 4 quadrants, soft and non tender, no ascites GU: deferred MSK: noted sarcopenia, moves all extremities, ambulatory with unsteady gait unless using rolling walker Skin: warm and dry except for periphery of hands/feet, no rashes on visible skin.  Left chest surgical site intact with old dried blood noted on steristrips, no drainage or erythema Neuro:  no generalized weakness,  no cognitive impairment Psych: non-anxious affect, A and O x 3 Hem/lymph/immuno: no widespread bruising  CURRENT PROBLEM LIST:  Patient Active Problem List   Diagnosis Date Noted   Secondary hypercoagulable state ([place]) 01/06/2021   Protein-calorie malnutrition, severe  12/23/2020   Diarrhea    Paroxysmal atrial fibrillation (HCC)    CHF NYHA class I (no symptoms from ordinary activities), acute, systolic (HCC)    UTI (urinary tract infection) 12/22/2020   Acute lower UTI (urinary tract infection) 12/21/2020   Hypokalemia 12/21/2020   Volume depletion 12/21/2020   Sepsis ([place]) 01/17/2019   Acute respiratory failure with hypoxia ([place]) 01/17/2019   Pneumonia due to COVID-19 virus 01/17/2019   Pulmonary nodule 01/17/2019   Thoracic aortic aneurysm 01/17/2019   Fall 12/21/2018   General weakness 12/20/2018   Pressure injury of skin 12/20/2018   Meningioma ([facility]) 12/20/2018   AKI (acute kidney injury) ([place]) 12/20/2018   Fall at home, initial encounter 04/19/2017   Bilateral hip dislocation ([place]) 04/19/2017   Uncontrolled pain 04/19/2017   Hypertension 04/19/2017   Leukocytosis 04/19/2017   Elevated troponin 04/19/2017   Osteoarthritis of  hand 01/01/2016   Osteoarthritis of foot 01/01/2016   Unspecified osteoarthritis, unspecified site 01/01/2016   S/p reverse total shoulder arthroplasty 08/27/2014   Chronic diarrhea 12/22/2011   PAST MEDICAL HISTORY:  Active Ambulatory Problems    Diagnosis Date Noted   Chronic diarrhea 12/22/2011   S/p reverse total shoulder arthroplasty 08/27/2014   Osteoarthritis of hand 01/01/2016   Osteoarthritis of foot 01/01/2016   Unspecified osteoarthritis, unspecified site 01/01/2016   Fall at home, initial encounter 04/19/2017   Bilateral hip dislocation ([place]) 04/19/2017   Uncontrolled pain 04/19/2017   Hypertension 04/19/2017   Leukocytosis 04/19/2017   Elevated troponin 04/19/2017   General weakness 12/20/2018   Pressure injury of skin 12/20/2018   Meningioma ([place]) 12/20/2018   AKI (acute kidney injury) ([place] [facility]) 12/20/2018   Fall 12/21/2018   Sepsis ([place]) 01/17/2019   Acute respiratory failure with hypoxia ([facility]) 01/17/2019   Pneumonia due to COVID-19 virus 01/17/2019   Pulmonary nodule 01/17/2019   Thoracic aortic aneurysm 01/17/2019   Acute lower UTI (urinary tract infection) 12/21/2020   Hypokalemia 12/21/2020   Volume depletion 12/21/2020   UTI (urinary tract infection) 12/22/2020   Protein-calorie malnutrition, severe 12/23/2020   Diarrhea    Paroxysmal atrial fibrillation (HCC)    CHF NYHA class I (no symptoms from ordinary activities), acute, systolic (HCC)    Secondary hypercoagulable state ([place]) 01/06/2021   Resolved Ambulatory Problems    Diagnosis Date Noted   No Resolved Ambulatory Problems   Past Medical History:  Diagnosis Date   Anemia    Arthritis    Cancer ([place])    Complication of anesthesia    COPD (chronic obstructive pulmonary disease) ([facility])    Depression    GERD (gastroesophageal reflux disease)    Goiter    Headache(784.0)    History of blood transfusion 2006   Neuromuscular disorder ([place])    Osteoporosis    Shortness of breath dyspnea    UTI  (lower urinary tract infection)    SOCIAL HX:  Social History   Tobacco Use   Smoking status: Former    Types: Cigarettes    Quit date: 02/28/1968    Years since quitting: 52.9   Smokeless tobacco: Never   Tobacco comments:    Former smoker 01/06/2021  Substance Use Topics   Alcohol use: Not Currently    Comment: rarely   FAMILY HX:  Family History  Problem Relation Age of Onset   Heart attack Mother    Stroke Father       ALLERGIES:  Allergies  Allergen Reactions   Amoxicillin-Pot Clavulanate Diarrhea    diarrhea  Cephalexin Diarrhea   Ciprofloxacin Diarrhea   Hydromorphone     Pt does not remember   Lisinopril     fatigue   Mirtazapine     Pt doesn't remember.     Nitrofuran Derivatives Diarrhea   Septra [Bactrim]     Pt doesn't remember.    Sulfamethoxazole-Trimethoprim      PERTINENT MEDICATIONS:  Outpatient Encounter Medications as of 02/11/2021  Medication Sig   acetaminophen (TYLENOL) 650 MG CR tablet Take 650 mg by mouth every 8 (eight) hours as needed for pain.   amLODipine (NORVASC) 5 MG tablet Take 5 mg by mouth at bedtime.   apixaban (ELIQUIS) 2.5 MG TABS tablet Take 1 tablet (2.5 mg total) by mouth 2 (two) times daily.   carvedilol (COREG) 6.25 MG tablet Take 6.25 mg by mouth 2 (two) times daily.   feeding supplement (BOOST HIGH PROTEIN) LIQD Take 1 Container by mouth daily.   fluticasone (FLONASE) 50 MCG/ACT nasal spray Place 2 sprays into both nostrils daily.   glycopyrrolate (ROBINUL) 1 MG tablet Take 1 mg by mouth 3 (three) times daily.   hydrochlorothiazide (HYDRODIURIL) 12.5 MG tablet Take 12.5 mg by mouth daily.   loperamide (IMODIUM) 2 MG capsule Take 1 capsule (2 mg total) by mouth 4 (four) times daily as needed for diarrhea or loose stools. (Patient taking differently: Take 4 mg by mouth 2 (two) times daily before a meal.)   losartan (COZAAR) 25 MG tablet Take 12.5 mg by mouth daily.   Multiple Vitamin (MULTIVITAMIN WITH MINERALS) TABS  tablet Take 1 tablet by mouth daily.   [DISCONTINUED] amLODipine (NORVASC) 2.5 MG tablet Take 5 mg by mouth at bedtime.   [DISCONTINUED] glycopyrrolate (ROBINUL) 2 MG tablet Take 2 mg by mouth 3 (three) times daily before meals.   loratadine (CLARITIN) 10 MG tablet Take 1 tablet (10 mg total) by mouth daily. (Patient not taking: Reported on 02/12/2021)   [DISCONTINUED] Opium 10 MG/ML (1%) TINC Take 0.6 mLs by mouth 4 (four) times daily as needed for diarrhea or loose stools. (Patient not taking: Reported on 02/12/2021)   Facility-Administered Encounter Medications as of 02/11/2021  Medication   lidocaine-EPINEPHrine (XYLOCAINE W/EPI) 2 %-1:100000 (with pres) injection 10 mL   Thank you for the opportunity to participate in the care of [name].  The palliative care team will continue to follow. Please call our office at [phone] if we can be of additional assistance.   Marijo Conception, FNP ,   COVID-19 PATIENT SCREENING TOOL Asked and negative response unless otherwise noted:  Have you had symptoms of covid, tested positive or been in contact with someone with symptoms/positive test in the past 5-10 days? No. [name] had Covid but had negative test on 02/08/21 prior to visiting patient.

## 2021-02-14 DIAGNOSIS — R262 Difficulty in walking, not elsewhere classified: Secondary | ICD-10-CM | POA: Diagnosis not present

## 2021-02-14 DIAGNOSIS — M6281 Muscle weakness (generalized): Secondary | ICD-10-CM | POA: Diagnosis not present

## 2021-02-15 DIAGNOSIS — M6281 Muscle weakness (generalized): Secondary | ICD-10-CM | POA: Diagnosis not present

## 2021-02-16 DIAGNOSIS — M6281 Muscle weakness (generalized): Secondary | ICD-10-CM | POA: Diagnosis not present

## 2021-02-17 DIAGNOSIS — M6281 Muscle weakness (generalized): Secondary | ICD-10-CM | POA: Diagnosis not present

## 2021-02-17 DIAGNOSIS — R262 Difficulty in walking, not elsewhere classified: Secondary | ICD-10-CM | POA: Diagnosis not present

## 2021-02-18 ENCOUNTER — Other Ambulatory Visit: Payer: Self-pay

## 2021-02-18 ENCOUNTER — Telehealth (INDEPENDENT_AMBULATORY_CARE_PROVIDER_SITE_OTHER): Payer: Medicare Other | Admitting: Cardiovascular Disease

## 2021-02-18 DIAGNOSIS — Z95818 Presence of other cardiac implants and grafts: Secondary | ICD-10-CM

## 2021-02-18 DIAGNOSIS — M6281 Muscle weakness (generalized): Secondary | ICD-10-CM | POA: Diagnosis not present

## 2021-02-18 NOTE — Progress Notes (Signed)
Video telemedicine appointment, with assistance of staff at her assisted living facility.  Follow-up after loop recorder implantation.  She has minimal bruising at the site.  There is no swelling or redness or other signs of infection.  No drainage.  The Steri-Strips are still in place, but it looks like they are starting to come off.  Questions answered.  We will continue remote downloads on a monthly basis.

## 2021-02-22 DIAGNOSIS — M6281 Muscle weakness (generalized): Secondary | ICD-10-CM | POA: Diagnosis not present

## 2021-02-22 DIAGNOSIS — R262 Difficulty in walking, not elsewhere classified: Secondary | ICD-10-CM | POA: Diagnosis not present

## 2021-02-23 DIAGNOSIS — M6281 Muscle weakness (generalized): Secondary | ICD-10-CM | POA: Diagnosis not present

## 2021-02-23 DIAGNOSIS — R262 Difficulty in walking, not elsewhere classified: Secondary | ICD-10-CM | POA: Diagnosis not present

## 2021-02-24 DIAGNOSIS — M6281 Muscle weakness (generalized): Secondary | ICD-10-CM | POA: Diagnosis not present

## 2021-02-24 DIAGNOSIS — R262 Difficulty in walking, not elsewhere classified: Secondary | ICD-10-CM | POA: Diagnosis not present

## 2021-03-01 DIAGNOSIS — R262 Difficulty in walking, not elsewhere classified: Secondary | ICD-10-CM | POA: Diagnosis not present

## 2021-03-01 DIAGNOSIS — M6281 Muscle weakness (generalized): Secondary | ICD-10-CM | POA: Diagnosis not present

## 2021-03-03 DIAGNOSIS — R262 Difficulty in walking, not elsewhere classified: Secondary | ICD-10-CM | POA: Diagnosis not present

## 2021-03-03 DIAGNOSIS — M6281 Muscle weakness (generalized): Secondary | ICD-10-CM | POA: Diagnosis not present

## 2021-03-04 DIAGNOSIS — M6281 Muscle weakness (generalized): Secondary | ICD-10-CM | POA: Diagnosis not present

## 2021-03-07 DIAGNOSIS — M6281 Muscle weakness (generalized): Secondary | ICD-10-CM | POA: Diagnosis not present

## 2021-03-07 DIAGNOSIS — R262 Difficulty in walking, not elsewhere classified: Secondary | ICD-10-CM | POA: Diagnosis not present

## 2021-03-08 DIAGNOSIS — M6281 Muscle weakness (generalized): Secondary | ICD-10-CM | POA: Diagnosis not present

## 2021-03-08 DIAGNOSIS — R262 Difficulty in walking, not elsewhere classified: Secondary | ICD-10-CM | POA: Diagnosis not present

## 2021-03-10 ENCOUNTER — Ambulatory Visit (HOSPITAL_COMMUNITY): Payer: Medicare Other

## 2021-03-10 DIAGNOSIS — M6281 Muscle weakness (generalized): Secondary | ICD-10-CM | POA: Diagnosis not present

## 2021-03-10 DIAGNOSIS — R262 Difficulty in walking, not elsewhere classified: Secondary | ICD-10-CM | POA: Diagnosis not present

## 2021-03-15 ENCOUNTER — Ambulatory Visit (INDEPENDENT_AMBULATORY_CARE_PROVIDER_SITE_OTHER): Payer: Medicare Other

## 2021-03-15 DIAGNOSIS — M6281 Muscle weakness (generalized): Secondary | ICD-10-CM | POA: Diagnosis not present

## 2021-03-15 DIAGNOSIS — I480 Paroxysmal atrial fibrillation: Secondary | ICD-10-CM | POA: Diagnosis not present

## 2021-03-15 DIAGNOSIS — R262 Difficulty in walking, not elsewhere classified: Secondary | ICD-10-CM | POA: Diagnosis not present

## 2021-03-15 LAB — CUP PACEART REMOTE DEVICE CHECK
Date Time Interrogation Session: 20230117130746
Implantable Pulse Generator Implant Date: 20221212

## 2021-03-16 ENCOUNTER — Encounter (HOSPITAL_COMMUNITY): Payer: Self-pay | Admitting: *Deleted

## 2021-03-16 ENCOUNTER — Ambulatory Visit (HOSPITAL_COMMUNITY)
Admission: RE | Admit: 2021-03-16 | Discharge: 2021-03-16 | Disposition: A | Discharge status: Home / Self Care | Payer: Medicare Other | Source: Ambulatory Visit | Attending: Physician Assistant | Admitting: Physician Assistant

## 2021-03-16 ENCOUNTER — Other Ambulatory Visit: Payer: Self-pay

## 2021-03-16 ENCOUNTER — Telehealth: Payer: Self-pay

## 2021-03-16 DIAGNOSIS — I4891 Unspecified atrial fibrillation: Secondary | ICD-10-CM | POA: Diagnosis not present

## 2021-03-16 DIAGNOSIS — I700 Atherosclerosis of aorta: Secondary | ICD-10-CM | POA: Diagnosis not present

## 2021-03-16 DIAGNOSIS — I110 Hypertensive heart disease with heart failure: Secondary | ICD-10-CM | POA: Insufficient documentation

## 2021-03-16 DIAGNOSIS — I340 Nonrheumatic mitral (valve) insufficiency: Secondary | ICD-10-CM | POA: Insufficient documentation

## 2021-03-16 DIAGNOSIS — I5021 Acute systolic (congestive) heart failure: Secondary | ICD-10-CM | POA: Diagnosis not present

## 2021-03-16 DIAGNOSIS — J449 Chronic obstructive pulmonary disease, unspecified: Secondary | ICD-10-CM | POA: Diagnosis not present

## 2021-03-16 DIAGNOSIS — M6281 Muscle weakness (generalized): Secondary | ICD-10-CM | POA: Diagnosis not present

## 2021-03-16 DIAGNOSIS — I358 Other nonrheumatic aortic valve disorders: Secondary | ICD-10-CM | POA: Diagnosis not present

## 2021-03-16 DIAGNOSIS — R262 Difficulty in walking, not elsewhere classified: Secondary | ICD-10-CM | POA: Diagnosis not present

## 2021-03-16 LAB — ECHOCARDIOGRAM COMPLETE
Area-P 1/2: 2.16 cm2
MV VTI: 1.89 cm2
S' Lateral: 2 cm

## 2021-03-16 NOTE — Telephone Encounter (Signed)
-----   Message from Sanda Klein, MD sent at 03/16/2021  8:29 AM EST ----- Implantable loop recorder download. Several episodes of true atrial fibrillation with RVR are recorded, but a majority of events are related to T wave oversensing. Normal battery status.  Continue anticoagulation. Please bring in for device reprogramming to avoid TWOS. Note that QRS and T wave morphology do change (probably an intermittent bundle branch block) and more than one adjustment may be needed to avoid TWOS.  [name] should be able to help w reprogramming if we can please give him a heads up.Marland Kitchen

## 2021-03-16 NOTE — Telephone Encounter (Signed)
Attempted to contact patient to schedule device clinic apt. No answer, unable to leave VM.   Will need industry assistance for apt.

## 2021-03-17 DIAGNOSIS — M6281 Muscle weakness (generalized): Secondary | ICD-10-CM | POA: Diagnosis not present

## 2021-03-17 NOTE — Telephone Encounter (Signed)
Confirmed patient has a LINQ II and can be reprogrammed remotely. Discussed with industry. AF Detection Sensitivity changed from balanced sensitivity to least sensitive. AT/AF ectopy rejection changed from nominal to aggressive. Changes sent to device and confirmed. Will continue to monitor for TWOS. Routing to Dr. Sallyanne Kuster.

## 2021-03-21 DIAGNOSIS — M6281 Muscle weakness (generalized): Secondary | ICD-10-CM | POA: Diagnosis not present

## 2021-03-24 DIAGNOSIS — M6281 Muscle weakness (generalized): Secondary | ICD-10-CM | POA: Diagnosis not present

## 2021-03-24 DIAGNOSIS — R262 Difficulty in walking, not elsewhere classified: Secondary | ICD-10-CM | POA: Diagnosis not present

## 2021-03-25 DIAGNOSIS — R262 Difficulty in walking, not elsewhere classified: Secondary | ICD-10-CM | POA: Diagnosis not present

## 2021-03-25 DIAGNOSIS — M6281 Muscle weakness (generalized): Secondary | ICD-10-CM | POA: Diagnosis not present

## 2021-03-26 DIAGNOSIS — M6281 Muscle weakness (generalized): Secondary | ICD-10-CM | POA: Diagnosis not present

## 2021-03-28 DIAGNOSIS — M6281 Muscle weakness (generalized): Secondary | ICD-10-CM | POA: Diagnosis not present

## 2021-03-28 NOTE — Progress Notes (Signed)
[facility] Summary Report / Loop Recorder 

## 2021-03-29 DIAGNOSIS — M6281 Muscle weakness (generalized): Secondary | ICD-10-CM | POA: Diagnosis not present

## 2021-03-29 DIAGNOSIS — R262 Difficulty in walking, not elsewhere classified: Secondary | ICD-10-CM | POA: Diagnosis not present

## 2021-03-31 DIAGNOSIS — M6281 Muscle weakness (generalized): Secondary | ICD-10-CM | POA: Diagnosis not present

## 2021-03-31 DIAGNOSIS — R262 Difficulty in walking, not elsewhere classified: Secondary | ICD-10-CM | POA: Diagnosis not present

## 2021-04-01 DIAGNOSIS — R262 Difficulty in walking, not elsewhere classified: Secondary | ICD-10-CM | POA: Diagnosis not present

## 2021-04-01 DIAGNOSIS — M6281 Muscle weakness (generalized): Secondary | ICD-10-CM | POA: Diagnosis not present

## 2021-04-05 DIAGNOSIS — R262 Difficulty in walking, not elsewhere classified: Secondary | ICD-10-CM | POA: Diagnosis not present

## 2021-04-05 DIAGNOSIS — M6281 Muscle weakness (generalized): Secondary | ICD-10-CM | POA: Diagnosis not present

## 2021-04-06 DIAGNOSIS — R262 Difficulty in walking, not elsewhere classified: Secondary | ICD-10-CM | POA: Diagnosis not present

## 2021-04-06 DIAGNOSIS — M6281 Muscle weakness (generalized): Secondary | ICD-10-CM | POA: Diagnosis not present

## 2021-04-07 DIAGNOSIS — M6281 Muscle weakness (generalized): Secondary | ICD-10-CM | POA: Diagnosis not present

## 2021-04-07 DIAGNOSIS — R262 Difficulty in walking, not elsewhere classified: Secondary | ICD-10-CM | POA: Diagnosis not present

## 2021-04-08 DIAGNOSIS — M6281 Muscle weakness (generalized): Secondary | ICD-10-CM | POA: Diagnosis not present

## 2021-04-11 DIAGNOSIS — R262 Difficulty in walking, not elsewhere classified: Secondary | ICD-10-CM | POA: Diagnosis not present

## 2021-04-11 DIAGNOSIS — M6281 Muscle weakness (generalized): Secondary | ICD-10-CM | POA: Diagnosis not present

## 2021-04-13 DIAGNOSIS — M6281 Muscle weakness (generalized): Secondary | ICD-10-CM | POA: Diagnosis not present

## 2021-04-14 DIAGNOSIS — M6281 Muscle weakness (generalized): Secondary | ICD-10-CM | POA: Diagnosis not present

## 2021-04-18 ENCOUNTER — Ambulatory Visit: Payer: Medicare Other

## 2021-04-18 DIAGNOSIS — I480 Paroxysmal atrial fibrillation: Secondary | ICD-10-CM

## 2021-04-18 LAB — CUP PACEART REMOTE DEVICE CHECK
Date Time Interrogation Session: 20230219231053
Implantable Pulse Generator Implant Date: 20221212

## 2021-04-18 NOTE — Progress Notes (Signed)
Yes please

## 2021-04-18 NOTE — Progress Notes (Signed)
Device reprogrammed remotely as requested.

## 2021-05-02 DIAGNOSIS — D164 Benign neoplasm of bones of skull and face: Secondary | ICD-10-CM | POA: Diagnosis not present

## 2021-05-23 ENCOUNTER — Ambulatory Visit (INDEPENDENT_AMBULATORY_CARE_PROVIDER_SITE_OTHER): Payer: Medicare Other

## 2021-05-23 DIAGNOSIS — I480 Paroxysmal atrial fibrillation: Secondary | ICD-10-CM

## 2021-05-24 LAB — CUP PACEART REMOTE DEVICE CHECK
Date Time Interrogation Session: 20230326230611
Implantable Pulse Generator Implant Date: 20221212

## 2021-05-31 DIAGNOSIS — H2513 Age-related nuclear cataract, bilateral: Secondary | ICD-10-CM | POA: Diagnosis not present

## 2021-05-31 DIAGNOSIS — H5203 Hypermetropia, bilateral: Secondary | ICD-10-CM | POA: Diagnosis not present

## 2021-06-01 NOTE — Progress Notes (Signed)
[facility] Summary Report / Loop Recorder 

## 2021-06-25 LAB — CUP PACEART REMOTE DEVICE CHECK
Date Time Interrogation Session: 20230428230327
Implantable Pulse Generator Implant Date: 20221212

## 2021-06-27 ENCOUNTER — Ambulatory Visit (INDEPENDENT_AMBULATORY_CARE_PROVIDER_SITE_OTHER): Payer: Medicare Other

## 2021-06-27 DIAGNOSIS — I480 Paroxysmal atrial fibrillation: Secondary | ICD-10-CM

## 2021-07-12 NOTE — Progress Notes (Signed)
[facility] Summary Report / Loop Recorder 

## 2021-08-01 ENCOUNTER — Ambulatory Visit (INDEPENDENT_AMBULATORY_CARE_PROVIDER_SITE_OTHER): Payer: Medicare Other

## 2021-08-01 DIAGNOSIS — I480 Paroxysmal atrial fibrillation: Secondary | ICD-10-CM | POA: Diagnosis not present

## 2021-08-02 LAB — CUP PACEART REMOTE DEVICE CHECK
Date Time Interrogation Session: 20230606093127
Implantable Pulse Generator Implant Date: 20221212

## 2021-08-11 DIAGNOSIS — M2041 Other hammer toe(s) (acquired), right foot: Secondary | ICD-10-CM | POA: Diagnosis not present

## 2021-08-11 DIAGNOSIS — B351 Tinea unguium: Secondary | ICD-10-CM | POA: Diagnosis not present

## 2021-08-11 DIAGNOSIS — S9031XA Contusion of right foot, initial encounter: Secondary | ICD-10-CM | POA: Diagnosis not present

## 2021-08-17 NOTE — Progress Notes (Signed)
[facility] Summary Report / Loop Recorder 

## 2021-08-20 DIAGNOSIS — M79671 Pain in right foot: Secondary | ICD-10-CM | POA: Diagnosis not present

## 2021-09-01 DIAGNOSIS — G629 Polyneuropathy, unspecified: Secondary | ICD-10-CM | POA: Diagnosis not present

## 2021-09-04 LAB — CUP PACEART REMOTE DEVICE CHECK
Date Time Interrogation Session: 20230703230516
Implantable Pulse Generator Implant Date: 20221212

## 2021-09-05 ENCOUNTER — Ambulatory Visit (INDEPENDENT_AMBULATORY_CARE_PROVIDER_SITE_OTHER): Payer: Medicare Other

## 2021-09-05 DIAGNOSIS — I480 Paroxysmal atrial fibrillation: Secondary | ICD-10-CM | POA: Diagnosis not present

## 2021-09-07 DIAGNOSIS — Z85820 Personal history of malignant melanoma of skin: Secondary | ICD-10-CM | POA: Diagnosis not present

## 2021-09-07 DIAGNOSIS — D225 Melanocytic nevi of trunk: Secondary | ICD-10-CM | POA: Diagnosis not present

## 2021-09-07 DIAGNOSIS — L821 Other seborrheic keratosis: Secondary | ICD-10-CM | POA: Diagnosis not present

## 2021-09-07 DIAGNOSIS — Z85828 Personal history of other malignant neoplasm of skin: Secondary | ICD-10-CM | POA: Diagnosis not present

## 2021-10-06 NOTE — Progress Notes (Signed)
[facility] Summary Report / Loop Recorder 

## 2021-10-10 ENCOUNTER — Ambulatory Visit: Payer: Medicare Other

## 2021-10-11 ENCOUNTER — Telehealth: Payer: Self-pay

## 2021-10-11 NOTE — Telephone Encounter (Signed)
[employer] Alert  ILR alert for AF/tachy episodes. AF alert triggered with 3 episodes, longest 11hrs, avg HR's 120's.  4 tachy episodes with EGM's show SVT/NCT with rates 160's (up to 1 min) preceded by bigeminy or increased PVC's. Presenting rhythm AF with onset 10/10/21 @ 2313. [place]- Eliquis. Routing for further review.   Patient called and reports she has covid. Denies any symptoms related to heart rhythm including chest pain, shortness of breath, palpations or dizziness. Patient has been seen by her PCP for covid. Advised I will forward to Dr. Sallyanne Kuster for update. Appreciative of call.

## 2021-10-11 NOTE — Telephone Encounter (Signed)
COVID infection explains the higher burden of arrhythmia. Suspect will gradually abate as viral infection resolves. Thanks

## 2021-10-12 LAB — CUP PACEART REMOTE DEVICE CHECK
Date Time Interrogation Session: 20230815144954
Implantable Pulse Generator Implant Date: 20221212

## 2021-10-25 DIAGNOSIS — Z0000 Encounter for general adult medical examination without abnormal findings: Secondary | ICD-10-CM | POA: Diagnosis not present

## 2021-10-25 DIAGNOSIS — I5020 Unspecified systolic (congestive) heart failure: Secondary | ICD-10-CM | POA: Diagnosis not present

## 2021-10-25 DIAGNOSIS — I700 Atherosclerosis of aorta: Secondary | ICD-10-CM | POA: Diagnosis not present

## 2021-10-25 DIAGNOSIS — I480 Paroxysmal atrial fibrillation: Secondary | ICD-10-CM | POA: Diagnosis not present

## 2021-10-25 DIAGNOSIS — M069 Rheumatoid arthritis, unspecified: Secondary | ICD-10-CM | POA: Diagnosis not present

## 2021-10-25 DIAGNOSIS — R7309 Other abnormal glucose: Secondary | ICD-10-CM | POA: Diagnosis not present

## 2021-10-25 DIAGNOSIS — D6869 Other thrombophilia: Secondary | ICD-10-CM | POA: Diagnosis not present

## 2021-10-25 DIAGNOSIS — I129 Hypertensive chronic kidney disease with stage 1 through stage 4 chronic kidney disease, or unspecified chronic kidney disease: Secondary | ICD-10-CM | POA: Diagnosis not present

## 2021-10-25 DIAGNOSIS — R197 Diarrhea, unspecified: Secondary | ICD-10-CM | POA: Diagnosis not present

## 2021-10-25 DIAGNOSIS — R269 Unspecified abnormalities of gait and mobility: Secondary | ICD-10-CM | POA: Diagnosis not present

## 2021-10-25 DIAGNOSIS — Z79899 Other long term (current) drug therapy: Secondary | ICD-10-CM | POA: Diagnosis not present

## 2021-10-25 DIAGNOSIS — G629 Polyneuropathy, unspecified: Secondary | ICD-10-CM | POA: Diagnosis not present

## 2021-11-11 DIAGNOSIS — I959 Hypotension, unspecified: Secondary | ICD-10-CM | POA: Diagnosis not present

## 2021-11-11 DIAGNOSIS — R197 Diarrhea, unspecified: Secondary | ICD-10-CM | POA: Diagnosis not present

## 2021-11-14 ENCOUNTER — Ambulatory Visit (INDEPENDENT_AMBULATORY_CARE_PROVIDER_SITE_OTHER): Payer: Medicare Other

## 2021-11-14 DIAGNOSIS — I480 Paroxysmal atrial fibrillation: Secondary | ICD-10-CM | POA: Diagnosis not present

## 2021-11-15 LAB — CUP PACEART REMOTE DEVICE CHECK
Date Time Interrogation Session: 20230917231521
Implantable Pulse Generator Implant Date: 20221212

## 2021-11-18 DIAGNOSIS — B351 Tinea unguium: Secondary | ICD-10-CM | POA: Diagnosis not present

## 2021-11-25 NOTE — Progress Notes (Signed)
[facility] Summary Report / Loop Recorder 

## 2021-12-16 ENCOUNTER — Ambulatory Visit
Admission: EM | Admit: 2021-12-16 | Discharge: 2021-12-16 | Disposition: A | Discharge status: Home / Self Care | Payer: Medicare Other | Attending: Emergency Medicine | Admitting: Emergency Medicine

## 2021-12-16 DIAGNOSIS — S51012A Laceration without foreign body of left elbow, initial encounter: Secondary | ICD-10-CM

## 2021-12-16 MED ORDER — CLINDAMYCIN HCL 150 MG PO CAPS: 150.0000 mg | ORAL_CAPSULE | Freq: Three times a day (TID) | ORAL | 0 refills | Status: DC

## 2021-12-16 MED ORDER — CLINDAMYCIN HCL 150 MG PO CAPS: 150.0000 mg | ORAL_CAPSULE | Freq: Three times a day (TID) | ORAL | 0 refills | Status: AC

## 2021-12-16 NOTE — ED Triage Notes (Signed)
The patient states she fell against the kitchen counter. The patient states she obtained a scrap the the left arm.   Occurred: 2-3 days ago   There is a skin tear about 1.5in long with scant amount of old blood to the area.

## 2021-12-16 NOTE — ED Notes (Signed)
Pt educated on how to take medications.

## 2021-12-16 NOTE — Discharge Instructions (Signed)
Please allow the Steri-Strips placed across your wound to remain in place until they fall off on their own.  By the time they fall off, your wound should be completely healed.  For infection prevention, I sent a prescription for an antibiotic called clindamycin to your pharmacy.  Please take 1 capsule 3 times daily for the next 5 days.  Please continue to monitor your wound for signs of warmth, swelling, drainage or increased pain.  If any of these occur, please be sure to have your wound evaluated as soon as possible so that you can be treated for worsening infection.  Thank you for visiting urgent care today.

## 2021-12-16 NOTE — ED Provider Notes (Signed)
[facility]    CSN: [id number] Arrival date & time: 12/16/21  1735    HISTORY   Chief Complaint  Patient presents with   skin tear   Abrasion   HPI [name] is a pleasant, 88 y.o. female who presents to urgent care today. Patient presents to urgent care after being transported by [employer] from the assisted living facility where she currently resides.  Apparently this is their method of transporting patients for urgent medical care.  Patient states that she fell against her kitchen counter and scraped her elbow.  Patient states this occurred 2 to 3 days ago, history provided by patient today is somewhat limited due to what appears to be some mild confusion.  Patient states that she was wearing a sweater at the time of the injury and did not attend to the injury immediately.  Patient states she was unaware that it was bleeding.  Patient states that because it was bleeding, the blood became embedded in her sweater and this water began to stick to her wound.  Patient states she attempted to remove her sweater yesterday by soaking her elbow in water but said this was not effective in loosening this water from her skin, states when she pulled the sweater sleeve loose some of her skin was removed which caused further bleeding.  The history is provided by the patient.   Past Medical History:  Diagnosis Date   Anemia    Arthritis    Cancer ([place])    melanoma   Chronic diarrhea    Complication of anesthesia    hallucinations in the past, last surgery was OK anesth.    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   GERD (gastroesophageal reflux disease)    Goiter    Headache(784.0)    sinus   History of blood transfusion 2006   due to colon bleeding    Hypertension    Neuromuscular disorder (HCC)    peripheral neuropathy - both feet    Osteoporosis    Shortness of breath dyspnea    UTI (lower urinary  tract infection)    start Tx today /w probiotic- 08/18/2014    Patient Active Problem List   Diagnosis Date Noted   Secondary hypercoagulable state ([place]) 01/06/2021   Protein-calorie malnutrition, severe 12/23/2020   Diarrhea    Paroxysmal atrial fibrillation (HCC)    CHF NYHA class I (no symptoms from ordinary activities), acute, systolic ([place])    UTI (urinary tract infection) 12/22/2020   Acute lower UTI (urinary tract infection) 12/21/2020   Hypokalemia 12/21/2020   Volume depletion 12/21/2020   Sepsis ([place]) 01/17/2019   Acute respiratory failure with hypoxia ([place]) 01/17/2019   Pneumonia due to COVID-19 virus 01/17/2019   Pulmonary nodule 01/17/2019   Thoracic aortic aneurysm ([place]) 01/17/2019   Fall 12/21/2018   General weakness 12/20/2018   Pressure injury of skin 12/20/2018   Meningioma ([place]) 12/20/2018   AKI (acute kidney injury) ([place]) 12/20/2018   Fall at home, initial encounter 04/19/2017   Bilateral hip dislocation ([place]) 04/19/2017   Uncontrolled pain 04/19/2017   Hypertension 04/19/2017   Leukocytosis 04/19/2017   Elevated troponin 04/19/2017   Osteoarthritis of hand 01/01/2016   Osteoarthritis of foot 01/01/2016   Unspecified osteoarthritis, unspecified site 01/01/2016   S/p reverse total shoulder arthroplasty 08/27/2014   Chronic diarrhea 12/22/2011   Past Surgical History:  Procedure Laterality  Date   ABDOMINAL HYSTERECTOMY     APPENDECTOMY     BALLOON DILATION  04/19/2011   Procedure: BALLOON DILATION;  Surgeon: Landry Dyke, MD;  Location: [facility];  Service: Endoscopy;  Laterality: N/A;   BLADDER SURGERY     COLON SURGERY  2006   Dr. Zella Richer- due to diverticulitis   ESOPHAGOGASTRODUODENOSCOPY  04/19/2011   Procedure: ESOPHAGOGASTRODUODENOSCOPY (EGD);  Surgeon: Landry Dyke, MD;  Location: [facility];  Service: Endoscopy;  Laterality: N/A;  mac    hip replacement-bilateral     JOINT REPLACEMENT     REVERSE SHOULDER ARTHROPLASTY Left  08/27/2014   Procedure: REVERSE LEFT SHOULDER ARTHROPLASTY;  Surgeon: Justice Britain, MD;  Location: [facility];  Service: Orthopedics;  Laterality: Left;   TOTAL KNEE ARTHROPLASTY Bilateral    OB History     Gravida      Para      Term      Preterm      AB      Living  1      SAB      IAB      Ectopic      Multiple      Live Births             Home Medications    Prior to Admission medications   Medication Sig Start Date End Date Taking? Authorizing Provider  acetaminophen (TYLENOL) 650 MG CR tablet Take 650 mg by mouth every 8 (eight) hours as needed for pain.    [provider]  amLODipine (NORVASC) 5 MG tablet Take 5 mg by mouth at bedtime. 02/08/21   [provider]  apixaban (ELIQUIS) 2.5 MG TABS tablet Take 1 tablet (2.5 mg total) by mouth 2 (two) times daily. 12/30/20   Georgette Shell, MD  carvedilol (COREG) 6.25 MG tablet Take 6.25 mg by mouth 2 (two) times daily. 11/15/20   [provider]  feeding supplement (BOOST HIGH PROTEIN) LIQD Take 1 Container by mouth daily.    [provider]  fluticasone (FLONASE) 50 MCG/ACT nasal spray Place 2 sprays into both nostrils daily. 12/31/20   Georgette Shell, MD  glycopyrrolate (ROBINUL) 1 MG tablet Take 1 mg by mouth 3 (three) times daily. 01/18/21   [provider]  hydrochlorothiazide (HYDRODIURIL) 12.5 MG tablet Take 12.5 mg by mouth daily.    [provider]  loperamide (IMODIUM) 2 MG capsule Take 1 capsule (2 mg total) by mouth 4 (four) times daily as needed for diarrhea or loose stools. Patient taking differently: Take 4 mg by mouth 2 (two) times daily before a meal. 63/87/56   Delora Fuel, MD  loratadine (CLARITIN) 10 MG tablet Take 1 tablet (10 mg total) by mouth daily. Patient not taking: Reported on 02/12/2021 12/31/20   Georgette Shell, MD  losartan (COZAAR) 25 MG tablet Take 12.5 mg by mouth daily.    [provider]  Multiple Vitamin  (MULTIVITAMIN WITH MINERALS) TABS tablet Take 1 tablet by mouth daily. 12/25/18   Kayleen Memos, DO    Family History Family History  Problem Relation Age of Onset   Heart attack Mother    Stroke Father    Social History Social History   Tobacco Use   Smoking status: Former    Types: Cigarettes    Quit date: 02/28/1968    Years since quitting: 53.8   Smokeless tobacco: Never   Tobacco comments:    Former smoker 01/06/2021  Substance Use  Topics   Alcohol use: Not Currently    Comment: rarely   Drug use: No   Allergies   Amoxicillin-pot clavulanate, Cephalexin, Ciprofloxacin, Hydromorphone, Lisinopril, Mirtazapine, Nitrofuran derivatives, Septra [bactrim], and Sulfamethoxazole-trimethoprim  Review of Systems Review of Systems Pertinent findings revealed after performing a 14 point review of systems has been noted in the history of present illness.  Physical Exam Triage Vital Signs ED Triage Vitals  Enc Vitals Group     BP 12/24/20 0827 (!) 147/82     Pulse Rate 12/24/20 0827 72     Resp 12/24/20 0827 18     Temp 12/24/20 0827 98.3 F (36.8 C)     Temp Source 12/24/20 0827 Oral     SpO2 12/24/20 0827 98 %     Weight --      Height --      Head Circumference --      Peak Flow --      Pain Score 12/24/20 0826 5     Pain Loc --      Pain Edu? --      Excl. in [place]? --   No data found.  Updated Vital Signs BP (!) 171/90 (BP Location: Right Arm)   Pulse 79   Temp 98.1 F (36.7 C) (Oral)   Resp 16   SpO2 93%   Physical Exam Vitals and nursing note reviewed.  Constitutional:      General: She is not in acute distress.    Appearance: Normal appearance.  HENT:     Head: Normocephalic and atraumatic.  Eyes:     Pupils: Pupils are equal, round, and reactive to light.  Cardiovascular:     Rate and Rhythm: Normal rate and regular rhythm.  Pulmonary:     Effort: Pulmonary effort is normal.     Breath sounds: Normal breath sounds.  Musculoskeletal:         General: Normal range of motion.     Cervical back: Normal range of motion and neck supple.  Skin:    General: Skin is warm and dry.     Findings: Lesion (The wound patient's left below have been clean and dried and covered with Steri-Strips prior to my evaluation.  I do not see any signs of surrounding erythema, induration or drainage from the wound) present.  Neurological:     General: No focal deficit present.     Mental Status: She is alert and oriented to person, place, and time. Mental status is at baseline.  Psychiatric:        Mood and Affect: Mood normal.        Behavior: Behavior normal.        Thought Content: Thought content normal.        Judgment: Judgment normal.     Visual Acuity Right Eye Distance:   Left Eye Distance:   Bilateral Distance:    Right Eye Near:   Left Eye Near:    Bilateral Near:     UC Couse / Diagnostics / Procedures:     Radiology No results found.  Procedures Procedures (including critical care time) EKG  Pending results:  Labs Reviewed - No data to display  Medications Ordered in UC: Medications - No data to display  UC Diagnoses / Final Clinical Impressions(s)   I have reviewed the triage vital signs and the nursing notes.  Pertinent labs & imaging results that were available during my care of the patient were reviewed by me and considered in my  medical decision making (see chart for details).    Final diagnoses:  Laceration of skin of left elbow, initial encounter   Given the age of patient's wound and contamination with her sweater for several days, I believe patient requires a short course of antibiotics.  Patient reports multiple intolerances of antibiotics.  For this reason, Clindamycin 2 mg 3 times daily was chosen.  Patient was given careful instructions to allow the Steri-Strips to remain in place until they fall off on their own.  Patient was also advised to monitor her wound for signs of worsening infection and to  follow-up if any of these occur.  ED Prescriptions     Medication Sig Dispense Auth. Provider   clindamycin (CLEOCIN) 150 MG capsule Take 1 capsule (150 mg total) by mouth 3 (three) times daily for 5 days. 15 capsule Lynden Oxford Scales, PA-C      PDMP not reviewed this encounter.  Pending results:  Labs Reviewed - No data to display  Discharge Instructions:   Discharge Instructions      Please allow the Steri-Strips placed across your wound to remain in place until they fall off on their own.  By the time they fall off, your wound should be completely healed.  For infection prevention, I sent a prescription for an antibiotic called clindamycin to your pharmacy.  Please take 1 capsule 3 times daily for the next 5 days.  Please continue to monitor your wound for signs of warmth, swelling, drainage or increased pain.  If any of these occur, please be sure to have your wound evaluated as soon as possible so that you can be treated for worsening infection.  Thank you for visiting urgent care today.      Disposition Upon Discharge:  Condition: stable for discharge home  Patient presented with an acute illness with associated systemic symptoms and significant discomfort requiring urgent management. In my opinion, this is a condition that a prudent lay person (someone who possesses an average knowledge of health and medicine) may potentially expect to result in complications if not addressed urgently such as respiratory distress, impairment of bodily function or dysfunction of bodily organs.   Routine symptom specific, illness specific and/or disease specific instructions were discussed with the patient and/or caregiver at length.   As such, the patient has been evaluated and assessed, work-up was performed and treatment was provided in alignment with urgent care protocols and evidence based medicine.  Patient/parent/caregiver has been advised that the patient may require follow up  for further testing and treatment if the symptoms continue in spite of treatment, as clinically indicated and appropriate.  Patient/parent/caregiver has been advised to return to the [facility] or PCP if no better; to PCP or the Emergency Department if new signs and symptoms develop, or if the current signs or symptoms continue to change or worsen for further workup, evaluation and treatment as clinically indicated and appropriate  The patient will follow up with their current PCP if and as advised. If the patient does not currently have a PCP we will assist them in obtaining one.   The patient may need specialty follow up if the symptoms continue, in spite of conservative treatment and management, for further workup, evaluation, consultation and treatment as clinically indicated and appropriate.   Patient/parent/caregiver verbalized understanding and agreement of plan as discussed.  All questions were addressed during visit.  Please see discharge instructions below for further details of plan.  This office note has been dictated using Dragon  speech recognition software.  Unfortunately, this method of dictation can sometimes lead to typographical or grammatical errors.  I apologize for your inconvenience in advance if this occurs.  Please do not hesitate to reach out to me if clarification is needed.      Lynden Oxford Scales, PA-C 12/16/21 2020

## 2021-12-19 ENCOUNTER — Ambulatory Visit (INDEPENDENT_AMBULATORY_CARE_PROVIDER_SITE_OTHER): Payer: Medicare Other

## 2021-12-19 DIAGNOSIS — I480 Paroxysmal atrial fibrillation: Secondary | ICD-10-CM | POA: Diagnosis not present

## 2021-12-20 LAB — CUP PACEART REMOTE DEVICE CHECK
Date Time Interrogation Session: 20231022231011
Implantable Pulse Generator Implant Date: 20221212

## 2021-12-23 ENCOUNTER — Telehealth: Payer: Self-pay | Admitting: Family Medicine

## 2021-12-23 ENCOUNTER — Non-Acute Institutional Stay: Payer: Medicare Other | Admitting: Family Medicine

## 2021-12-23 VITALS — BP 102/60 | HR 85 | Resp 20

## 2021-12-23 DIAGNOSIS — W19XXXS Unspecified fall, sequela: Secondary | ICD-10-CM | POA: Diagnosis not present

## 2021-12-23 DIAGNOSIS — R197 Diarrhea, unspecified: Secondary | ICD-10-CM

## 2021-12-23 DIAGNOSIS — E43 Unspecified severe protein-calorie malnutrition: Secondary | ICD-10-CM | POA: Diagnosis not present

## 2021-12-23 DIAGNOSIS — K900 Celiac disease: Secondary | ICD-10-CM | POA: Diagnosis not present

## 2021-12-23 NOTE — Progress Notes (Signed)
[facility] Consult Note Telephone: [phone]  Fax: [phone]    Date of encounter: 12/23/21 1:42 PM PATIENT NAME: [name] [address] [address]   [phone] (home)  DOB: November 25, 1930 MRN: [id number] PRIMARY CARE PROVIDER:    Lajean Manes, MD,  [address] [address] [phone]  REFERRING PROVIDER:   Lajean Manes, MD [address],  [place] [address] [phone]  RESPONSIBLE PARTY:    Contact Information     Name Relation Home Work [place] Son [phone]  [phone]   Tharon, Kitch Relative [phone]  [phone]   [place] [phone]          I met face to face with patient in her AL facility. Palliative Care was asked to follow this patient by consultation request of  Lajean Manes, MD to address advance care planning and complex medical decision making. This is a follow up visit   ASSESSMENT , SYMPTOM MANAGEMENT AND PLAN / RECOMMENDATIONS:    Diarrhea due to malabsoprtion/Severe protein calorie malnutrition Continue to encourage use of Ensure Plus at least twice daily. Encourage fluid intake to prevent dehydration.   Fall, sequelae  Left elbow wound healing without evidence of infection. Recommend PT/OT evaluation and treatment   Advance Care Planning/Goals of Care: Goals include to maximize quality of life and symptom management.  CODE STATUS: Last reported code status full code 12/2020     Follow up Palliative Care Visit: Palliative care will continue to follow for complex medical decision making, advance care planning, and clarification of goals. Return 4 weeks or prn.   This visit was coded based on medical decision making (MDM).  PPS: 70%  HOSPICE ELIGIBILITY/DIAGNOSIS: TBD  Chief Complaint:  Palliative Care is continuing to follow patient for chronic medical management in setting of CHF with  atrial fibrillation and to assist with advanced care planning, refining and defining goals of care.  HISTORY OF PRESENT ILLNESS:  [name] is a 88 y.o. year old female with systolic CHF, paroxysmal atrial fibrillation, thoracic aortic aneurysm, pulmonary nodule, chronic diarrhea, meningioma, multijoint osteoarthritis, fall risk, and severe protein calorie malnutrition.  Pt states she has had near fall last Friday and caused a laceration of her left elbow which was steristripped and she completed a 5 day course of Clindamycin today. Prior to that she doesn't remember the exact timing but states she had a fall on her bottom and couldn't get up so they had to call the rescue squad to get her up.  She denies pain, SOB, lightheadedness, blood in stool, nausea/vomiting, dysuria.  She states she has a small appetite and she doesn't usually eat breakfast but does drink an ensure plus.  She states it doesn't take much to fill her up and she brings it back to her apartment and will eat on another day for lunch. Pt has difficulty reaching things on lower shelf in refrigerator and asked for provider to pull some stuff forward for her.  She has multiple partial meals in containers in the refrigerator, some foods are dried out and some are out of date. Pt state she had a colectomy due to bleeding diverticulum so her small intestine is routed out and she has some difficulty with loose stools but did not have to have a colostomy.She has learned to manage the diarrhea by taking antidiarrheal med from OTC and if really severe she can take her Glycopyralate prescribed by  the GI doctor.  History obtained from review of EMR, discussion with [name].   She had her last internal loop recorder interrogation on 12/18/2021 no labs or procedures done since last visit other than loop recorder interrogation. I reviewed EMR for available labs, medications, imaging, studies and related documents. Records reviewed and summarized above.    ROS General: NAD EYES: denies vision changes ENMT: denies dysphagia Cardiovascular: denies chest pain, denies DOE Pulmonary: denies cough, denies increased SOB Abdomen: endorses "small" appetite, endorses chronic diarrhea, endorses continence of bowel GU: denies dysuria, endorses occasional incontinence of urine MSK:  endorses weakness in standing,  2 falls reported Skin: denies rashes, endorses left elbow wound Neurological: denies pain, denies insomnia Psych: Endorses positive mood Heme/lymph/immuno: denies bruises, abnormal bleeding  Physical Exam: Current and past weights: 112 lbs 6.4 ounces as of 01/06/21 Constitutional: NAD General: thin/underweight ENMT: intact hearing, CV: S1S2, RRR, no LE edema Pulmonary: CTAB, no increased work of breathing, no cough, room air Abdomen: normo-active BS + 4 quadrants, soft and non tender MSK: noedt sarcopenia of extremities, moves all extremities with limited ROM right shoulder, ambulatory with rolling walker Skin: warm and dry, scabbed laceration to left elbow without erythema or drainage Neuro:  no generalized weakness,  no cognitive impairment Psych: non-anxious affect, A and O x 3 Hem/lymph/immuno: no widespread bruising   Thank you for the opportunity to participate in the care of [name].  The palliative care team will continue to follow. Please call our office at [phone] if we can be of additional assistance.   Marijo Conception, FNP -C  COVID-19 PATIENT SCREENING TOOL Asked and negative response unless otherwise noted:   Have you had symptoms of covid, tested positive or been in contact with someone with symptoms/positive test in the past 5-10 days?  No

## 2021-12-24 ENCOUNTER — Encounter: Payer: Self-pay | Admitting: Family Medicine

## 2021-12-24 NOTE — Telephone Encounter (Signed)
Knocked on pt's door at ALF and she indicated that she needed a couple of minutes to finish dressing and would open the door when done.  Damaris Hippo FNP-C

## 2022-01-23 ENCOUNTER — Ambulatory Visit (INDEPENDENT_AMBULATORY_CARE_PROVIDER_SITE_OTHER): Payer: Medicare Other

## 2022-01-23 DIAGNOSIS — I480 Paroxysmal atrial fibrillation: Secondary | ICD-10-CM | POA: Diagnosis not present

## 2022-01-23 LAB — CUP PACEART REMOTE DEVICE CHECK
Date Time Interrogation Session: 20231126231927
Implantable Pulse Generator Implant Date: 20221212

## 2022-02-13 DIAGNOSIS — Z7901 Long term (current) use of anticoagulants: Secondary | ICD-10-CM | POA: Diagnosis not present

## 2022-02-13 DIAGNOSIS — M2011 Hallux valgus (acquired), right foot: Secondary | ICD-10-CM | POA: Diagnosis not present

## 2022-02-13 DIAGNOSIS — B351 Tinea unguium: Secondary | ICD-10-CM | POA: Diagnosis not present

## 2022-02-13 DIAGNOSIS — M79675 Pain in left toe(s): Secondary | ICD-10-CM | POA: Diagnosis not present

## 2022-02-28 ENCOUNTER — Ambulatory Visit (INDEPENDENT_AMBULATORY_CARE_PROVIDER_SITE_OTHER): Payer: Medicare Other

## 2022-02-28 DIAGNOSIS — I480 Paroxysmal atrial fibrillation: Secondary | ICD-10-CM | POA: Diagnosis not present

## 2022-02-28 LAB — CUP PACEART REMOTE DEVICE CHECK
Date Time Interrogation Session: 20240101231431
Implantable Pulse Generator Implant Date: 20221212

## 2022-03-03 NOTE — Progress Notes (Signed)
[facility] Summary Report / Loop Recorder 

## 2022-03-27 NOTE — Progress Notes (Signed)
[facility] Summary Report / Loop Recorder

## 2022-04-02 LAB — CUP PACEART REMOTE DEVICE CHECK
Date Time Interrogation Session: 20240203231516
Implantable Pulse Generator Implant Date: 20221212

## 2022-04-03 ENCOUNTER — Ambulatory Visit: Payer: Medicare Other

## 2022-04-03 DIAGNOSIS — I480 Paroxysmal atrial fibrillation: Secondary | ICD-10-CM

## 2022-04-16 ENCOUNTER — Encounter (HOSPITAL_COMMUNITY): Payer: Self-pay | Admitting: Family Medicine

## 2022-04-16 ENCOUNTER — Other Ambulatory Visit: Payer: Self-pay

## 2022-04-16 ENCOUNTER — Emergency Department (HOSPITAL_COMMUNITY): Payer: Medicare Other

## 2022-04-16 ENCOUNTER — Inpatient Hospital Stay (HOSPITAL_COMMUNITY)
Admission: EM | Admit: 2022-04-16 | Discharge: 2022-04-19 | DRG: 682 | Disposition: A | Discharge status: Home / Self Care | Payer: Medicare Other | Attending: Internal Medicine | Admitting: Internal Medicine

## 2022-04-16 DIAGNOSIS — D329 Benign neoplasm of meninges, unspecified: Secondary | ICD-10-CM | POA: Diagnosis present

## 2022-04-16 DIAGNOSIS — B9620 Unspecified Escherichia coli [E. coli] as the cause of diseases classified elsewhere: Secondary | ICD-10-CM | POA: Diagnosis present

## 2022-04-16 DIAGNOSIS — Z8249 Family history of ischemic heart disease and other diseases of the circulatory system: Secondary | ICD-10-CM | POA: Diagnosis not present

## 2022-04-16 DIAGNOSIS — Z823 Family history of stroke: Secondary | ICD-10-CM

## 2022-04-16 DIAGNOSIS — N1832 Chronic kidney disease, stage 3b: Secondary | ICD-10-CM | POA: Diagnosis not present

## 2022-04-16 DIAGNOSIS — R531 Weakness: Secondary | ICD-10-CM | POA: Diagnosis not present

## 2022-04-16 DIAGNOSIS — E860 Dehydration: Secondary | ICD-10-CM | POA: Diagnosis not present

## 2022-04-16 DIAGNOSIS — E43 Unspecified severe protein-calorie malnutrition: Secondary | ICD-10-CM | POA: Diagnosis present

## 2022-04-16 DIAGNOSIS — Z6820 Body mass index (BMI) 20.0-20.9, adult: Secondary | ICD-10-CM

## 2022-04-16 DIAGNOSIS — E8809 Other disorders of plasma-protein metabolism, not elsewhere classified: Secondary | ICD-10-CM | POA: Diagnosis present

## 2022-04-16 DIAGNOSIS — M069 Rheumatoid arthritis, unspecified: Secondary | ICD-10-CM | POA: Diagnosis not present

## 2022-04-16 DIAGNOSIS — J449 Chronic obstructive pulmonary disease, unspecified: Secondary | ICD-10-CM | POA: Diagnosis not present

## 2022-04-16 DIAGNOSIS — F0390 Unspecified dementia without behavioral disturbance: Secondary | ICD-10-CM | POA: Diagnosis not present

## 2022-04-16 DIAGNOSIS — Z96612 Presence of left artificial shoulder joint: Secondary | ICD-10-CM | POA: Diagnosis present

## 2022-04-16 DIAGNOSIS — I130 Hypertensive heart and chronic kidney disease with heart failure and stage 1 through stage 4 chronic kidney disease, or unspecified chronic kidney disease: Secondary | ICD-10-CM | POA: Diagnosis not present

## 2022-04-16 DIAGNOSIS — I10 Essential (primary) hypertension: Secondary | ICD-10-CM | POA: Diagnosis not present

## 2022-04-16 DIAGNOSIS — E876 Hypokalemia: Secondary | ICD-10-CM | POA: Diagnosis not present

## 2022-04-16 DIAGNOSIS — F32A Depression, unspecified: Secondary | ICD-10-CM | POA: Diagnosis present

## 2022-04-16 DIAGNOSIS — Z85820 Personal history of malignant melanoma of skin: Secondary | ICD-10-CM

## 2022-04-16 DIAGNOSIS — R6889 Other general symptoms and signs: Secondary | ICD-10-CM | POA: Diagnosis not present

## 2022-04-16 DIAGNOSIS — D649 Anemia, unspecified: Secondary | ICD-10-CM | POA: Diagnosis not present

## 2022-04-16 DIAGNOSIS — Z743 Need for continuous supervision: Secondary | ICD-10-CM | POA: Diagnosis not present

## 2022-04-16 DIAGNOSIS — L89311 Pressure ulcer of right buttock, stage 1: Secondary | ICD-10-CM | POA: Diagnosis present

## 2022-04-16 DIAGNOSIS — N3001 Acute cystitis with hematuria: Secondary | ICD-10-CM | POA: Diagnosis present

## 2022-04-16 DIAGNOSIS — K529 Noninfective gastroenteritis and colitis, unspecified: Secondary | ICD-10-CM | POA: Diagnosis present

## 2022-04-16 DIAGNOSIS — L89321 Pressure ulcer of left buttock, stage 1: Secondary | ICD-10-CM | POA: Diagnosis present

## 2022-04-16 DIAGNOSIS — Z882 Allergy status to sulfonamides status: Secondary | ICD-10-CM

## 2022-04-16 DIAGNOSIS — Z96653 Presence of artificial knee joint, bilateral: Secondary | ICD-10-CM | POA: Diagnosis present

## 2022-04-16 DIAGNOSIS — C709 Malignant neoplasm of meninges, unspecified: Secondary | ICD-10-CM | POA: Diagnosis not present

## 2022-04-16 DIAGNOSIS — I5032 Chronic diastolic (congestive) heart failure: Secondary | ICD-10-CM | POA: Diagnosis not present

## 2022-04-16 DIAGNOSIS — Z888 Allergy status to other drugs, medicaments and biological substances status: Secondary | ICD-10-CM

## 2022-04-16 DIAGNOSIS — E8729 Other acidosis: Secondary | ICD-10-CM | POA: Diagnosis not present

## 2022-04-16 DIAGNOSIS — I444 Left anterior fascicular block: Secondary | ICD-10-CM | POA: Diagnosis not present

## 2022-04-16 DIAGNOSIS — Z79899 Other long term (current) drug therapy: Secondary | ICD-10-CM

## 2022-04-16 DIAGNOSIS — N390 Urinary tract infection, site not specified: Secondary | ICD-10-CM | POA: Diagnosis present

## 2022-04-16 DIAGNOSIS — N189 Chronic kidney disease, unspecified: Secondary | ICD-10-CM | POA: Diagnosis not present

## 2022-04-16 DIAGNOSIS — Z87891 Personal history of nicotine dependence: Secondary | ICD-10-CM

## 2022-04-16 DIAGNOSIS — I480 Paroxysmal atrial fibrillation: Secondary | ICD-10-CM | POA: Diagnosis not present

## 2022-04-16 DIAGNOSIS — Z7901 Long term (current) use of anticoagulants: Secondary | ICD-10-CM | POA: Diagnosis not present

## 2022-04-16 DIAGNOSIS — Z96643 Presence of artificial hip joint, bilateral: Secondary | ICD-10-CM | POA: Diagnosis present

## 2022-04-16 DIAGNOSIS — L89152 Pressure ulcer of sacral region, stage 2: Secondary | ICD-10-CM | POA: Diagnosis present

## 2022-04-16 DIAGNOSIS — K909 Intestinal malabsorption, unspecified: Secondary | ICD-10-CM

## 2022-04-16 DIAGNOSIS — Z880 Allergy status to penicillin: Secondary | ICD-10-CM

## 2022-04-16 DIAGNOSIS — N179 Acute kidney failure, unspecified: Secondary | ICD-10-CM | POA: Diagnosis not present

## 2022-04-16 DIAGNOSIS — G629 Polyneuropathy, unspecified: Secondary | ICD-10-CM | POA: Diagnosis present

## 2022-04-16 DIAGNOSIS — K219 Gastro-esophageal reflux disease without esophagitis: Secondary | ICD-10-CM | POA: Diagnosis present

## 2022-04-16 LAB — CBC
HCT: 40.5 % (ref 36.0–46.0)
Hemoglobin: 12.9 g/dL (ref 12.0–15.0)
MCH: 30 pg (ref 26.0–34.0)
MCHC: 31.9 g/dL (ref 30.0–36.0)
MCV: 94.2 fL (ref 80.0–100.0)
Platelets: 216 10*3/uL (ref 150–400)
RBC: 4.3 MIL/uL (ref 3.87–5.11)
RDW: 13.5 % (ref 11.5–15.5)
WBC: 5.6 10*3/uL (ref 4.0–10.5)
nRBC: 0 % (ref 0.0–0.2)

## 2022-04-16 LAB — URINALYSIS, ROUTINE W REFLEX MICROSCOPIC
Bilirubin Urine: NEGATIVE
Glucose, UA: NEGATIVE mg/dL
Ketones, ur: NEGATIVE mg/dL
Nitrite: POSITIVE — AB
Protein, ur: 30 mg/dL — AB
Specific Gravity, Urine: 1.014 (ref 1.005–1.030)
WBC, UA: >50 WBC/hpf (ref 0–5)
pH: 6 (ref 5.0–8.0)

## 2022-04-16 LAB — BASIC METABOLIC PANEL
Anion gap: 11 (ref 5–15)
BUN: 30 mg/dL — ABNORMAL HIGH (ref 8–23)
CO2: 30 mmol/L (ref 22–32)
Calcium: 9 mg/dL (ref 8.9–10.3)
Chloride: 99 mmol/L (ref 98–111)
Creatinine, Ser: 1.49 mg/dL — ABNORMAL HIGH (ref 0.44–1.00)
GFR, Estimated: 33 mL/min — ABNORMAL LOW (ref >60)
Glucose, Bld: 108 mg/dL — ABNORMAL HIGH (ref 70–99)
Potassium: 4 mmol/L (ref 3.5–5.1)
Sodium: 140 mmol/L (ref 135–145)

## 2022-04-16 LAB — CBG MONITORING, ED: Glucose-Capillary: 84 mg/dL (ref 70–99)

## 2022-04-16 MED ORDER — ENOXAPARIN SODIUM 40 MG/0.4ML IJ SOSY: 40.0000 mg | PREFILLED_SYRINGE | INTRAMUSCULAR | Status: DC

## 2022-04-16 MED ORDER — SODIUM CHLORIDE 0.9 % IV SOLN
1.0000 g | Freq: Once | INTRAVENOUS | Status: AC
  Administered 2022-04-16: 1 g via INTRAVENOUS
  Filled 2022-04-16: qty 10

## 2022-04-16 MED ORDER — SODIUM CHLORIDE 0.9 % IV BOLUS
500.0000 mL | Freq: Once | INTRAVENOUS | Status: AC
  Administered 2022-04-16: 500 mL via INTRAVENOUS

## 2022-04-16 MED ORDER — AMLODIPINE BESYLATE 5 MG PO TABS
5.0000 mg | ORAL_TABLET | Freq: Every day | ORAL | Status: DC
  Administered 2022-04-16 – 2022-04-18 (×3): 5 mg via ORAL
  Filled 2022-04-16 (×3): qty 1

## 2022-04-16 MED ORDER — CARVEDILOL 6.25 MG PO TABS
6.2500 mg | ORAL_TABLET | Freq: Two times a day (BID) | ORAL | Status: DC
  Administered 2022-04-16 – 2022-04-19 (×6): 6.25 mg via ORAL
  Filled 2022-04-16 (×6): qty 1

## 2022-04-16 MED ORDER — LOPERAMIDE HCL 2 MG PO CAPS
4.0000 mg | ORAL_CAPSULE | Freq: Once | ORAL | Status: AC
  Administered 2022-04-16: 4 mg via ORAL

## 2022-04-16 MED ORDER — ONDANSETRON HCL 4 MG/2ML IJ SOLN: 4.0000 mg | Freq: Four times a day (QID) | INTRAMUSCULAR | Status: DC | PRN

## 2022-04-16 MED ORDER — TRAZODONE HCL 50 MG PO TABS: 25.0000 mg | ORAL_TABLET | Freq: Every evening | ORAL | Status: DC | PRN

## 2022-04-16 MED ORDER — MAGNESIUM HYDROXIDE 400 MG/5ML PO SUSP: 30.0000 mL | Freq: Every day | ORAL | Status: DC | PRN

## 2022-04-16 MED ORDER — ACETAMINOPHEN 325 MG PO TABS: 650.0000 mg | ORAL_TABLET | Freq: Four times a day (QID) | ORAL | Status: DC | PRN

## 2022-04-16 MED ORDER — ACETAMINOPHEN 650 MG RE SUPP: 650.0000 mg | Freq: Four times a day (QID) | RECTAL | Status: DC | PRN

## 2022-04-16 MED ORDER — ENSURE ENLIVE PO LIQD
237.0000 mL | ORAL | Status: DC
  Administered 2022-04-17: 237 mL via ORAL

## 2022-04-16 MED ORDER — SODIUM CHLORIDE 0.9 % IV SOLN
1.0000 g | INTRAVENOUS | Status: DC
  Administered 2022-04-17 – 2022-04-18 (×2): 1 g via INTRAVENOUS
  Filled 2022-04-16 (×2): qty 10

## 2022-04-16 MED ORDER — SODIUM CHLORIDE 0.9 % IV SOLN: INTRAVENOUS | Status: DC

## 2022-04-16 MED ORDER — DEXAMETHASONE SODIUM PHOSPHATE 10 MG/ML IJ SOLN
6.0000 mg | INTRAMUSCULAR | Status: DC
  Administered 2022-04-17 – 2022-04-18 (×2): 6 mg via INTRAVENOUS
  Filled 2022-04-16 (×3): qty 1

## 2022-04-16 MED ORDER — ONDANSETRON HCL 4 MG PO TABS: 4.0000 mg | ORAL_TABLET | Freq: Four times a day (QID) | ORAL | Status: DC | PRN

## 2022-04-16 MED ORDER — FLUTICASONE PROPIONATE 50 MCG/ACT NA SUSP
2.0000 | Freq: Every day | NASAL | Status: DC
  Administered 2022-04-18: 2 via NASAL
  Filled 2022-04-16: qty 16

## 2022-04-16 MED ORDER — APIXABAN 2.5 MG PO TABS
2.5000 mg | ORAL_TABLET | Freq: Two times a day (BID) | ORAL | Status: DC
  Administered 2022-04-16 – 2022-04-19 (×6): 2.5 mg via ORAL
  Filled 2022-04-16 (×6): qty 1

## 2022-04-16 MED ORDER — ADULT MULTIVITAMIN W/MINERALS CH
1.0000 | ORAL_TABLET | Freq: Every day | ORAL | Status: DC
  Administered 2022-04-17 – 2022-04-19 (×3): 1 via ORAL
  Filled 2022-04-16 (×3): qty 1

## 2022-04-16 MED ORDER — LOPERAMIDE HCL 2 MG PO CAPS
4.0000 mg | ORAL_CAPSULE | Freq: Two times a day (BID) | ORAL | Status: DC
  Administered 2022-04-17 – 2022-04-18 (×4): 4 mg via ORAL
  Filled 2022-04-16 (×6): qty 2

## 2022-04-16 MED ORDER — DEXAMETHASONE SODIUM PHOSPHATE 10 MG/ML IJ SOLN
10.0000 mg | Freq: Once | INTRAMUSCULAR | Status: AC
  Administered 2022-04-16: 10 mg via INTRAVENOUS
  Filled 2022-04-16: qty 1

## 2022-04-16 NOTE — ED Triage Notes (Signed)
Pt BIB EMS from [facility] for generalized weakness, "not feeling herself". Hx of same. Hx of chronic diarrhea. No N/V/D reported today. Denies falls, no LOC.  BP 160/80 HR 60 95% RA

## 2022-04-16 NOTE — Assessment & Plan Note (Signed)
-   The patient will be placed on IV Rocephin. - Will follow urine culture and sensitivity.

## 2022-04-16 NOTE — Assessment & Plan Note (Signed)
-   Will continue antihypertensives.

## 2022-04-16 NOTE — ED Notes (Signed)
ED TO INPATIENT HANDOFF REPORT  ED Nurse Name and Phone #: Luisafernanda Helinski Name/Age/Gender [name] 89 y.o. female Room/Bed: WA25/WA25  Code Status   Code Status: Full Code  Home/SNF/Other Home, alert and oriented  Is this baseline? yes  Triage Complete: Triage complete  Chief Complaint AKI (acute kidney injury) ([place]) [N17.9]  Triage Note Pt BIB EMS from [facility] for generalized weakness, "not feeling herself". Hx of same. Hx of chronic diarrhea. No N/V/D reported today. Denies falls, no LOC.  BP 160/80 HR 60 95% RA    Allergies Allergies  Allergen Reactions   Amoxicillin-Pot Clavulanate Diarrhea    diarrhea   Cephalexin Diarrhea   Ciprofloxacin Diarrhea   Hydromorphone     Pt does not remember   Lisinopril     fatigue   Mirtazapine     Pt doesn't remember.     Nitrofuran Derivatives Diarrhea   Septra [Bactrim]     Pt doesn't remember.    Sulfamethoxazole-Trimethoprim     Level of Care/Admitting Diagnosis ED Disposition     ED Disposition  Admit   Condition  --   Comment  Hospital Area: [facility] [[id number]]  Level of Care: Med-Surg [16]  May place patient in observation at [facility] or [facility] if equivalent level of care is available:: No  Covid Evaluation: Asymptomatic - no recent exposure (last 10 days) testing not required  Diagn[facility]: [facility]) [id number]  Admitting Physician: Christel Mormon [id number]  Attending Physician: Christel Mormon [id number]          B Medical/Surgery History Past Medical History:  Diagnosis Date   Anemia    Arthritis    Cancer ([place])    melanoma   Chronic diarrhea    Complication of anesthesia    hallucinations in the past, last surgery was OK anesth.    COPD (chronic obstructive pulmonary disease) ([place])    Depression    sad she cannot travel & do art like she use to.   GERD (gastroesophageal reflux disease)    Goiter    Headache(784.0)    sinus    History of blood transfusion 2006   due to colon bleeding    Hypertension    Neuromuscular disorder (HCC)    peripheral neuropathy - both feet    Osteoporosis    Shortness of breath dyspnea    UTI (lower urinary tract infection)    start Tx today /w probiotic- 08/18/2014    Past Surgical History:  Procedure Laterality Date   ABDOMINAL HYSTERECTOMY     APPENDECTOMY     BALLOON DILATION  04/19/2011   Procedure: BALLOON DILATION;  Surgeon: Landry Dyke, MD;  Location: [facility];  Service: Endoscopy;  Laterality: N/A;   BLADDER SURGERY     COLON SURGERY  2006   Dr. Zella Richer- due to diverticulitis   ESOPHAGOGASTRODUODENOSCOPY  04/19/2011   Procedure: ESOPHAGOGASTRODUODENOSCOPY (EGD);  Surgeon: Landry Dyke, MD;  Location: [facility];  Service: Endoscopy;  Laterality: N/A;  mac    hip replacement-bilateral     JOINT REPLACEMENT     REVERSE SHOULDER ARTHROPLASTY Left 08/27/2014   Procedure: REVERSE LEFT SHOULDER ARTHROPLASTY;  Surgeon: Justice Britain, MD;  Location: [facility];  Service: Orthopedics;  Laterality: Left;   TOTAL KNEE ARTHROPLASTY Bilateral      A IV Location/Drains/Wounds Patient Lines/Drains/Airways Status     Active Line/Drains/Airways     Name Placement date Placement time Site Days  Peripheral IV 04/16/22 20 G 1" Right Antecubital 04/16/22  1855  Antecubital  less than 1   Incision (Closed) 04/19/17 Head Posterior;Upper 04/19/17  0300  -- 1823   Pressure Injury 12/20/18 Coccyx Medial Stage II -  Partial thickness loss of dermis presenting as a shallow open ulcer with a red, pink wound bed without slough. pink, open skin, non-blanchable 12/20/18  1415  -- 1213            Intake/Output Last 24 hours No intake or output data in the 24 hours ending 04/16/22 2052  Labs/Imaging Results for orders placed or performed during the hospital encounter of 04/16/22 (from the past 48 hour(s))  Basic metabolic panel     Status: Abnormal   Collection Time: 04/16/22   2:14 PM  Result Value Ref Range   Sodium 140 135 - 145 mmol/L   Potassium 4.0 3.5 - 5.1 mmol/L   Chloride 99 98 - 111 mmol/L   CO2 30 22 - 32 mmol/L   Glucose, Bld 108 (H) 70 - 99 mg/dL    Comment: Glucose reference range applies only to samples taken after fasting for at least 8 hours.   BUN 30 (H) 8 - 23 mg/dL   Creatinine, Ser 1.49 (H) 0.44 - 1.00 mg/dL   Calcium 9.0 8.9 - 10.3 mg/dL   GFR, Estimated 33 (L) >60 mL/min    Comment: (NOTE) Calculated using the CKD-EPI Creatinine Equation (2021)    Anion gap 11 5 - 15    Comment: Performed at [facility], [place] [address] [place], [place] [address]  CBC     Status: None   Collection Time: 04/16/22  2:14 PM  Result Value Ref Range   WBC 5.6 4.0 - 10.5 K/uL   RBC 4.30 3.87 - 5.11 MIL/uL   Hemoglobin 12.9 12.0 - 15.0 g/dL   HCT 40.5 36.0 - 46.0 %   MCV 94.2 80.0 - 100.0 fL   MCH 30.0 26.0 - 34.0 pg   MCHC 31.9 30.0 - 36.0 g/dL   RDW 13.5 11.5 - 15.5 %   Platelets 216 150 - 400 K/uL   nRBC 0.0 0.0 - 0.2 %    Comment: Performed at [facility], [place] [address] [place], [place] [address]  Urinalysis, Routine w reflex microscopic -Urine, Clean Catch     Status: Abnormal   Collection Time: 04/16/22  2:51 PM  Result Value Ref Range   Color, Urine YELLOW YELLOW   APPearance CLOUDY (A) CLEAR   Specific Gravity, Urine 1.014 1.005 - 1.030   pH 6.0 5.0 - 8.0   Glucose, UA NEGATIVE NEGATIVE mg/dL   Hgb urine dipstick MODERATE (A) NEGATIVE   Bilirubin Urine NEGATIVE NEGATIVE   Ketones, ur NEGATIVE NEGATIVE mg/dL   Protein, ur 30 (A) NEGATIVE mg/dL   Nitrite POSITIVE (A) NEGATIVE   Leukocytes,Ua LARGE (A) NEGATIVE   RBC / HPF 21-50 0 - 5 RBC/hpf   WBC, UA >50 0 - 5 WBC/hpf   Bacteria, UA MANY (A) NONE SEEN   Squamous Epithelial / HPF 0-5 0 - 5 /HPF   WBC Clumps PRESENT    Mucus PRESENT     Comment: Performed at [facility], [place] [address] [place],  [address]  CBG  monitoring, ED     Status: None   Collection Time: 04/16/22  3:02 PM  Result Value Ref Range   Glucose-Capillary 84 70 - 99 mg/dL    Comment: Glucose reference  range applies only to samples taken after fasting for at least 8 hours.   CT Head Wo Contrast  Result Date: 04/16/2022 CLINICAL DATA:  Provided history: Mental status change, unknown cause. EXAM: CT HEAD WITHOUT CONTRAST TECHNIQUE: Contiguous axial images were obtained from the base of the skull through the vertex without intravenous contrast. RADIATION DOSE REDUCTION: This exam was performed according to the departmental dose-optimization program which includes automated exposure control, adjustment of the mA and/or kV according to patient size and/or use of iterative reconstruction technique. COMPARISON:  Brain MRI 12/20/2018 FINDINGS: Brain: Mild cerebral atrophy. 3.5 x 3.4 cm hyperdense extra-axial mass overlying the mid-to-posterior left frontal lobe compatible with a meningioma. The mass has increased in size from the prior brain MRI of 12/20/2018 (previously 3.0 x 3.2 cm, remeasured on prior). There is mass effect upon the underlying brain parenchyma without definite underlying parenchymal edema. No significant ventricular effacement. No midline shift. Moderate patchy and ill-defined hypoattenuation within the cerebral white matter, nonspecific but compatible with chronic small vessel ischemic disease. There is no acute intracranial hemorrhage. No demarcated cortical infarct. No extra-axial fluid collection. Vascular: No hyperdense vessel.  Atherosclerotic calcifications. Skull: 7 mm bony protuberance projecting outward from the frontal calvarium near midline, likely reflecting an osteoma. No calvarial fracture. Sinuses/Orbits: No orbital mass or acute orbital finding. No significant paranasal sinus disease at the imaged levels. IMPRESSION: 1. No evidence of acute infarct or acute intracranial hemorrhage. 2. 3.5 x 3.4 cm meningioma overlying  the mid-to-posterior left frontal lobe, increased in size from the prior brain MRI of 12/20/2018. There is mass effect upon the underlying brain parenchyma without definite underlying parenchymal edema. No significant ventricular effacement. No midline shift. 3. Background parenchymal atrophy and chronic small vessel disease as described. Electronically Signed   By: Kellie Simmering D.O.   On: 04/16/2022 17:27    Pending Labs Unresulted Labs (From admission, onward)     Start     Ordered   04/17/22 [id number]  Basic metabolic panel  Tomorrow morning,   R        04/16/22 1929   04/17/22 0500  CBC  Tomorrow morning,   R        04/16/22 1929   04/16/22 1852  Urine Culture (for pregnant, neutropenic or urologic patients or patients with an indwelling urinary catheter)  (Urine Labs)  Add-on,   AD       Comments: Hx of ESBL   Question:  Indication  Answer:  Altered mental status (if no other cause identified)   04/16/22 1851            Vitals/Pain Today's Vitals   04/16/22 1401 04/16/22 1850  BP: (!) 168/91 (!) 173/80  Pulse: 60 67  Resp: 18 18  Temp: 98.2 F (36.8 C) 98 F (36.7 C)  TempSrc:  Oral  SpO2: 97% 99%    Isolation Precautions No active isolations  Medications Medications  0.9 %  sodium chloride infusion (has no administration in time range)  acetaminophen (TYLENOL) tablet 650 mg (has no administration in time range)    Or  acetaminophen (TYLENOL) suppository 650 mg (has no administration in time range)  traZODone (DESYREL) tablet 25 mg (has no administration in time range)  ondansetron (ZOFRAN) tablet 4 mg (has no administration in time range)    Or  ondansetron (ZOFRAN) injection 4 mg (has no administration in time range)  magnesium hydroxide (MILK OF MAGNESIA) suspension 30 mL (has no administration in time range)  cefTRIAXone (ROCEPHIN)  1 g in sodium chloride 0.9 % 100 mL IVPB (has no administration in time range)  amLODipine (NORVASC) tablet 5 mg (has no  administration in time range)  carvedilol (COREG) tablet 6.25 mg (has no administration in time range)  loperamide (IMODIUM) capsule 4 mg (has no administration in time range)  apixaban (ELIQUIS) tablet 2.5 mg (has no administration in time range)  feeding supplement (ENSURE ENLIVE / ENSURE PLUS) liquid 237 mL (has no administration in time range)  multivitamin with minerals tablet 1 tablet (has no administration in time range)  fluticasone (FLONASE) 50 MCG/ACT nasal spray 2 spray (has no administration in time range)  loperamide (IMODIUM) capsule 4 mg (has no administration in time range)  dexamethasone (DECADRON) injection 10 mg (10 mg Intravenous Given 04/16/22 1855)  sodium chloride 0.9 % bolus 500 mL (0 mLs Intravenous Stopped 04/16/22 2014)  cefTRIAXone (ROCEPHIN) 1 g in sodium chloride 0.9 % 100 mL IVPB (0 g Intravenous Stopped 04/16/22 2014)    Mobility Minimal assistance     Focused Assessments Neuro Assessment Handoff  Pt alert and oriented   Swallow screen pass? Yes          Neuro Assessment:   Neuro Checks:      Has TPA been given? No If patient is a Neuro Trauma and patient is going to OR before floor call report to [place] nurse: [phone] or [phone]   R Recommendations: See Admitting Provider Note  Report given to:   Additional Notes: RA, VS within range

## 2022-04-16 NOTE — Assessment & Plan Note (Signed)
-   We will continue Eliquis and Coreg.

## 2022-04-16 NOTE — ED Notes (Signed)
Pt is resting in room having meal at this time, no distress noted. Needs addressed

## 2022-04-16 NOTE — Assessment & Plan Note (Signed)
-   Neurosurgery PA Alli Consentino was consulted by EDP who discussed lab and imaging findings as well as pertinent plan - they recommend: giving dose of decadron and follow with neurosurgery outpatient. She does not think patient needs anything done while inpatient/emergently.

## 2022-04-16 NOTE — Assessment & Plan Note (Addendum)
-   The patient will be admitted to an observation medical bed. - We will continue hydration with IV normal saline and follow BMP. - We will avoid nephrotoxins. - This is likely secondary to recent anorexia associated with diarrhea.

## 2022-04-16 NOTE — Assessment & Plan Note (Signed)
-   This is likely secondary to #1 and #2. - Management as above. - PT consult will be obtained. - Dietitian consult to be obtained given recent anorexia.

## 2022-04-16 NOTE — ED Provider Notes (Signed)
[facility] Provider Note   CSN: [id number] Arrival date & time: 04/16/22  1350     History  Chief Complaint  Patient presents with   Weakness    [name] is a 89 y.o. female with history of hypertension, chronic diarrhea, osteoporosis, skin cancer, COPD, GERD, peripheral neuropathy, CHF with pacemaker, and paroxysmal afib on Eliquis who  presents the emergency department complaining of generalized weakness.  Patient having from [facility] home.  She states that when the EMTs came to check on her today she "was not feeling like herself".  Says that she felt more weak than normal.  Denies any other symptoms.  Has history of chronic diarrhea, but says that she has had no nausea, vomiting, or diarrhea today.  Is not complaining of any pain.  She says that she tried to eat brunch around 12 PM today, was only able to have a few bites and a sip of tea before she was not interested in food any longer.  Denies any falls.  No loss of consciousness.   Weakness Associated symptoms: no diarrhea, no nausea and no vomiting        Home Medications Prior to Admission medications   Medication Sig Start Date End Date Taking? Authorizing Provider  acetaminophen (TYLENOL) 650 MG CR tablet Take 650 mg by mouth every 8 (eight) hours as needed for pain.    [provider]  amLODipine (NORVASC) 5 MG tablet Take 5 mg by mouth at bedtime. 02/08/21   [provider]  apixaban (ELIQUIS) 2.5 MG TABS tablet Take 1 tablet (2.5 mg total) by mouth 2 (two) times daily. 12/30/20   Georgette Shell, MD  carvedilol (COREG) 6.25 MG tablet Take 6.25 mg by mouth 2 (two) times daily. 11/15/20   [provider]  feeding supplement (BOOST HIGH PROTEIN) LIQD Take 1 Container by mouth daily.    [provider]  fluticasone (FLONASE) 50 MCG/ACT nasal spray Place 2 sprays into both nostrils daily. 12/31/20   Georgette Shell, MD   glycopyrrolate (ROBINUL) 1 MG tablet Take 1 mg by mouth 3 (three) times daily. 01/18/21   [provider]  hydrochlorothiazide (HYDRODIURIL) 12.5 MG tablet Take 12.5 mg by mouth daily.    [provider]  loperamide (IMODIUM) 2 MG capsule Take 1 capsule (2 mg total) by mouth 4 (four) times daily as needed for diarrhea or loose stools. Patient taking differently: Take 4 mg by mouth 2 (two) times daily before a meal. [id number]   Delora Fuel, MD  loratadine (CLARITIN) 10 MG tablet Take 1 tablet (10 mg total) by mouth daily. Patient not taking: Reported on 02/12/2021 12/31/20   Georgette Shell, MD  losartan (COZAAR) 25 MG tablet Take 12.5 mg by mouth daily.    [provider]  Multiple Vitamin (MULTIVITAMIN WITH MINERALS) TABS tablet Take 1 tablet by mouth daily. 12/25/18   Kayleen Memos, DO      Allergies    Amoxicillin-pot clavulanate, Cephalexin, Ciprofloxacin, Hydromorphone, Lisinopril, Mirtazapine, Nitrofuran derivatives, Septra [bactrim], and Sulfamethoxazole-trimethoprim    Review of Systems   Review of Systems  Constitutional:  Positive for appetite change.  Gastrointestinal:  Negative for diarrhea, nausea and vomiting.  Neurological:  Positive for weakness.  All other systems reviewed and are negative.   Physical Exam Updated Vital Signs BP (!) 173/80 (BP Location: Right Arm)   Pulse 67   Temp 98 F (36.7 C) (Oral)  Resp 18   SpO2 99%  Physical Exam Vitals and nursing note reviewed.  Constitutional:      Appearance: Normal appearance.  HENT:     Head: Normocephalic and atraumatic.  Eyes:     Conjunctiva/sclera: Conjunctivae normal.  Cardiovascular:     Rate and Rhythm: Normal rate and regular rhythm.  Pulmonary:     Effort: Pulmonary effort is normal. No respiratory distress.     Breath sounds: Normal breath sounds.  Abdominal:     General: There is no distension.     Palpations: Abdomen is soft.     Tenderness: There is no  abdominal tenderness.  Musculoskeletal:     Right lower leg: No edema.     Left lower leg: No edema.  Skin:    General: Skin is warm and dry.  Neurological:     General: No focal deficit present.     Mental Status: She is alert.     Comments: Neuro: Speech is clear, able to follow commands. CN III-XII intact grossly intact. PERRLA. EOMI. Sensation intact throughout. Str 5/5 all extremities.     ED Results / Procedures / Treatments   Labs (all labs ordered are listed, but only abnormal results are displayed) Labs Reviewed  BASIC METABOLIC PANEL - Abnormal; Notable for the following components:      Result Value   Glucose, Bld 108 (*)    BUN 30 (*)    Creatinine, Ser 1.49 (*)    GFR, Estimated 33 (*)    All other components within normal limits  URINALYSIS, ROUTINE W REFLEX MICROSCOPIC - Abnormal; Notable for the following components:   APPearance CLOUDY (*)    Hgb urine dipstick MODERATE (*)    Protein, ur 30 (*)    Nitrite POSITIVE (*)    Leukocytes,Ua LARGE (*)    Bacteria, UA MANY (*)    All other components within normal limits  URINE CULTURE  CBC  CBG MONITORING, ED    EKG None  Radiology CT Head Wo Contrast  Result Date: 04/16/2022 CLINICAL DATA:  Provided history: Mental status change, unknown cause. EXAM: CT HEAD WITHOUT CONTRAST TECHNIQUE: Contiguous axial images were obtained from the base of the skull through the vertex without intravenous contrast. RADIATION DOSE REDUCTION: This exam was performed according to the departmental dose-optimization program which includes automated exposure control, adjustment of the mA and/or kV according to patient size and/or use of iterative reconstruction technique. COMPARISON:  Brain MRI 12/20/2018 FINDINGS: Brain: Mild cerebral atrophy. 3.5 x 3.4 cm hyperdense extra-axial mass overlying the mid-to-posterior left frontal lobe compatible with a meningioma. The mass has increased in size from the prior brain MRI of 12/20/2018  (previously 3.0 x 3.2 cm, remeasured on prior). There is mass effect upon the underlying brain parenchyma without definite underlying parenchymal edema. No significant ventricular effacement. No midline shift. Moderate patchy and ill-defined hypoattenuation within the cerebral white matter, nonspecific but compatible with chronic small vessel ischemic disease. There is no acute intracranial hemorrhage. No demarcated cortical infarct. No extra-axial fluid collection. Vascular: No hyperdense vessel.  Atherosclerotic calcifications. Skull: 7 mm bony protuberance projecting outward from the frontal calvarium near midline, likely reflecting an osteoma. No calvarial fracture. Sinuses/Orbits: No orbital mass or acute orbital finding. No significant paranasal sinus disease at the imaged levels. IMPRESSION: 1. No evidence of acute infarct or acute intracranial hemorrhage. 2. 3.5 x 3.4 cm meningioma overlying the mid-to-posterior left frontal lobe, increased in size from the prior brain MRI of 12/20/2018. There  is mass effect upon the underlying brain parenchyma without definite underlying parenchymal edema. No significant ventricular effacement. No midline shift. 3. Background parenchymal atrophy and chronic small vessel disease as described. Electronically Signed   By: Kellie Simmering D.O.   On: 04/16/2022 17:27    Procedures Procedures    Medications Ordered in ED Medications  cefTRIAXone (ROCEPHIN) 1 g in sodium chloride 0.9 % 100 mL IVPB (1 g Intravenous New Bag/Given 04/16/22 1859)  dexamethasone (DECADRON) injection 10 mg (10 mg Intravenous Given 04/16/22 1855)  sodium chloride 0.9 % bolus 500 mL (500 mLs Intravenous New Bag/Given 04/16/22 1900)    ED Course/ Medical Decision Making/ A&P                             Medical Decision Making Amount and/or Complexity of Data Reviewed Labs: ordered. Radiology: ordered.  This patient is a 89 y.o. female who presents to the ED for concern of generalized  weakness, this involves an extensive number of treatment options, and is a complaint that carries with it a high risk of complications and morbidity. The emergent differential diagnosis prior to evaluation includes, but is not limited to,  CVA, spinal cord injury, ACS, arrhythmia, syncope, orthostatic hypotension, sepsis, hypoglycemia, hypoxia, electrolyte disturbance, endocrine disorder, anemia, environmental exposure, polypharmacy. This is not an exhaustive differential.   Past Medical History / Co-morbidities / Social History: hypertension, chronic diarrhea, osteoporosis, skin cancer, COPD, GERD, peripheral neuropathy, CHF with pacemaker, paroxysmal afib on Eliquis  Additional history: Chart reviewed. Pertinent results include: Hx of severe protein calorie malnutrition in the setting of diarrhea due to malabsorption. Follows with palliative care for medication management of CHF with atrial fibrillation. Patient is full code.  Echocardiogram from Jan 2023 shows LV EF of 60-65%. Documented hx of ESBL UTI, most recent urine culture from 2022 with e coli sensitive to ceftriaxone, ciprofloxacin and macrobid.   Physical Exam: Physical exam performed. The pertinent findings include: hypertensive, but otherwise normal vital signs. No focal deficits on neurologic exam. Lung sounds clear, no increased effort.   Lab Tests: I ordered, and personally interpreted labs.  The pertinent results include:  no leukocytosis, normal hemoglobin. BUN 30, creatinine 1.49, and GFR 33 compared to 48, 0.95, and 58 respectively (2022).   Urinalysis with moderate hemoglobin, nitrite positive, large leukocytes, > 50 WBCs, and many bacteria with WBC clumps.    Imaging Studies: I ordered imaging studies including CT brain. I independently visualized and interpreted imaging which showed meningioma in mid to posterior left frontal lobe, increased in size since brain MRI 2020 with evidence of mass effect. I agree with the  radiologist interpretation.   Medications: I ordered medication including IV fluids, decadron, antibiotics. I have reviewed the patients home medicines and have made adjustments as needed.  Consultations Obtained: I requested consultation with the neurosurgery PA Alli Consentino,  and discussed lab and imaging findings as well as pertinent plan - they recommend: giving dose of decadron and follow with neurosurgery outpatient. She does not think patient needs anything done while inpatient/emergently.   Consulted with hospitalist Dr Sidney Ace who will admit.    Disposition: After consideration of the diagnostic results and the patients response to treatment, I feel that patient would benefit from medical admission in the setting of weakness likely caused by UTI with acute kidney injury.   I discussed this case with my attending physician Dr. Armandina Gemma who cosigned this note including patient's presenting symptoms,  physical exam, and planned diagnostics and interventions. Attending physician stated agreement with plan or made changes to plan which were implemented.    Final Clinical Impression(s) / ED Diagnoses Final diagnoses:  Meningioma ([place])  Acute cystitis with hematuria  Generalized weakness  Acute kidney injury ([place])    Rx / DC Orders ED Discharge Orders     None      Portions of this report may have been transcribed using voice recognition software. Every effort was made to ensure accuracy; however, inadvertent computerized transcription errors may be present.    Estill Cotta 04/16/22 1937    Regan Lemming, MD 04/16/22 2029

## 2022-04-16 NOTE — H&P (Signed)
PATIENT NAME: [name]    MR#:  [id number]  DATE OF BIRTH:  1930-06-30  DATE OF ADMISSION:  04/16/2022  PRIMARY CARE PHYSICIAN: Charlane Ferretti, MD   Patient is coming from: [facility]  REQUESTING/REFERRING PHYSICIAN: Roemhildt, Cecille Aver, PA-C    CHIEF COMPLAINT:   Chief Complaint  Patient presents with   Weakness    HISTORY OF PRESENT ILLNESS:  [name] is a 89 y.o. Caucasian female with medical history significant for COPD, GERD, CHF, peripheral neuropathy, paroxysmal atrial fibrillation on Eliquis depression, hypertension, chronic diarrhea, s/p  colectomy due to diverticulitis and osteoarthritis, who presented to the ED with acute onset of generalized weakness.  Per EMT she was not feeling like herself today and felt weaker than her normal.  She denies any nausea or vomiting or diarrhea though she has a history of chronic diarrhea.  She has been having urinary frequency and urgency with dysuria without hematuria or flank pain.  No abdominal pain or melena or bright red blood per rectum.  No chest pain or dyspnea or cough or wheezing.  No headache or dizziness or blurred vision.  No paresthesias or focal muscle weakness.  She has not been having much appetite.  No presyncope or syncope.  ED Course: When she came to the ER, BP was 168/91 with otherwise normal vital signs.  Labs revealed a BUN of 30 and creatinine of 1.49 with otherwise normal BMP.  CBC was within normal.  UA was positive for UTI.  Urine culture was sent.  EKG as reviewed by me : Normal sinus rhythm with a rate of 60 with PACs, left anterior fascicular block and Q waves anteriorly. Imaging: Noncontrasted CT scan revealed the following: 1. No evidence of acute infarct or acute intracranial hemorrhage. 2. 3.5 x 3.4 cm meningioma overlying the mid-to-posterior left frontal lobe, increased in size from the prior brain MRI of 12/20/2018. There is mass effect upon the underlying  brain parenchyma without definite underlying parenchymal edema. No significant ventricular effacement. No midline shift. 3. Background parenchymal atrophy and chronic small vessel disease as described.  The patient was given 10 mg of IV Decadron, 1 g of IV Rocephin and 500 mL IV normal saline bolus.  She will be admitted to a medical observation bed for further evaluation and management. PAST MEDICAL HISTORY:   Past Medical History:  Diagnosis Date   Anemia    Arthritis    Cancer ([place])    melanoma   Chronic diarrhea    Complication of anesthesia    hallucinations in the past, last surgery was OK anesth.    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   GERD (gastroesophageal reflux disease)    Goiter    Headache(784.0)    sinus   History of blood transfusion 2006   due to colon bleeding    Hypertension    Neuromuscular disorder (HCC)    peripheral neuropathy - both feet    Osteoporosis    Shortness of breath dyspnea    UTI (lower urinary tract infection)    start Tx today /w probiotic- 08/18/2014   CHF, peripheral neuropathy, paroxysmal atrial fibrillation on Eliquis and PPM  PAST SURGICAL HISTORY:   Past Surgical History:  Procedure Laterality Date   ABDOMINAL HYSTERECTOMY     APPENDECTOMY     BALLOON DILATION  04/19/2011   Procedure: BALLOON DILATION;  Surgeon: Landry Dyke, MD;  Location: [facility];  Service: Endoscopy;  Laterality: N/A;   BLADDER SURGERY     COLON SURGERY  2006   Dr. Zella Richer- due to diverticulitis   ESOPHAGOGASTRODUODENOSCOPY  04/19/2011   Procedure: ESOPHAGOGASTRODUODENOSCOPY (EGD);  Surgeon: Landry Dyke, MD;  Location: [facility];  Service: Endoscopy;  Laterality: N/A;  mac    hip replacement-bilateral     JOINT REPLACEMENT     REVERSE SHOULDER ARTHROPLASTY Left 08/27/2014   Procedure: REVERSE LEFT SHOULDER ARTHROPLASTY;  Surgeon: Justice Britain, MD;  Location:  [facility];  Service: Orthopedics;  Laterality: Left;   TOTAL KNEE ARTHROPLASTY Bilateral     SOCIAL HISTORY:   Social History   Tobacco Use   Smoking status: Former    Types: Cigarettes    Quit date: 02/28/1968    Years since quitting: 54.1   Smokeless tobacco: Never   Tobacco comments:    Former smoker 01/06/2021  Substance Use Topics   Alcohol use: Not Currently    Comment: rarely    FAMILY HISTORY:   Family History  Problem Relation Age of Onset   Heart attack Mother    Stroke Father     DRUG ALLERGIES:   Allergies  Allergen Reactions   Amoxicillin-Pot Clavulanate Diarrhea    diarrhea   Cephalexin Diarrhea   Ciprofloxacin Diarrhea   Hydromorphone     Pt does not remember   Lisinopril     fatigue   Mirtazapine     Pt doesn't remember.     Nitrofuran Derivatives Diarrhea   Septra [Bactrim]     Pt doesn't remember.    Sulfamethoxazole-Trimethoprim     REVIEW OF SYSTEMS:   ROS As per history of present illness. All pertinent systems were reviewed above. Constitutional, HEENT, cardiovascular, respiratory, GI, GU, musculoskeletal, neuro, psychiatric, endocrine, integumentary and hematologic systems were reviewed and are otherwise negative/unremarkable except for positive findings mentioned above in the HPI.   MEDICATIONS AT HOME:   Prior to Admission medications   Medication Sig Start Date End Date Taking? Authorizing Provider  acetaminophen (TYLENOL) 650 MG CR tablet Take 650 mg by mouth every 8 (eight) hours as needed for pain.   Yes [provider]  amLODipine (NORVASC) 5 MG tablet Take 5 mg by mouth at bedtime. 02/08/21  Yes [provider]  apixaban (ELIQUIS) 2.5 MG TABS tablet Take 1 tablet (2.5 mg total) by mouth 2 (two) times daily. 12/30/20  Yes Georgette Shell, MD  carvedilol (COREG) 6.25 MG tablet Take 6.25 mg by mouth 2 (two) times daily. 11/15/20  Yes [provider]  feeding supplement (BOOST HIGH PROTEIN) LIQD Take 1  Container by mouth daily.   Yes [provider]  fluticasone (FLONASE) 50 MCG/ACT nasal spray Place 2 sprays into both nostrils daily. 12/31/20  Yes Georgette Shell, MD  hydrochlorothiazide (HYDRODIURIL) 12.5 MG tablet Take 12.5 mg by mouth daily.   Yes [provider]  loperamide (IMODIUM) 2 MG capsule Take 1 capsule (2 mg total) by mouth 4 (four) times daily as needed for diarrhea or loose stools. Patient taking differently: Take 4 mg by mouth 2 (two) times daily before a meal. [id number]  Yes Delora Fuel, MD  Multiple Vitamin (MULTIVITAMIN WITH MINERALS) TABS tablet Take 1 tablet by mouth daily. 12/25/18  Yes Hall, Carole N, DO  loratadine (CLARITIN) 10 MG tablet Take 1 tablet (10 mg total) by mouth daily. Patient not taking: Reported  on 02/12/2021 12/31/20   Georgette Shell, MD      VITAL SIGNS:  Blood pressure (!) 173/80, pulse 67, temperature 98 F (36.7 C), temperature source Oral, resp. rate 18, SpO2 99 %.  PHYSICAL EXAMINATION:  Physical Exam  GENERAL:  89 y.o.-year-old Caucasian female patient lying in the bed with no acute distress.  EYES: Pupils equal, round, reactive to light and accommodation. No scleral icterus. Extraocular muscles intact.  HEENT: Head atraumatic, normocephalic. Oropharynx and nasopharynx clear.  NECK:  Supple, no jugular venous distention. No thyroid enlargement, no tenderness.  LUNGS: Normal breath sounds bilaterally, no wheezing, rales,rhonchi or crepitation. No use of accessory muscles of respiration.  CARDIOVASCULAR: Regular rate and rhythm, S1, S2 normal. No murmurs, rubs, or gallops.  ABDOMEN: Soft, nondistended, nontender. Bowel sounds present. No organomegaly or mass.  EXTREMITIES: No pedal edema, cyanosis, or clubbing.  NEUROLOGIC: Cranial nerves II through XII are intact. Muscle strength 5/5 in all extremities. Sensation intact. Gait not checked.  PSYCHIATRIC: The patient is alert and oriented x 3.  Normal affect and good  eye contact. SKIN: No obvious rash, lesion, or ulcer.   LABORATORY PANEL:   CBC Recent Labs  Lab 04/16/22 1414  WBC 5.6  HGB 12.9  HCT 40.5  PLT 216   ------------------------------------------------------------------------------------------------------------------  Chemistries  Recent Labs  Lab 04/16/22 1414  NA 140  K 4.0  CL 99  CO2 30  GLUCOSE 108*  BUN 30*  CREATININE 1.49*  CALCIUM 9.0   ------------------------------------------------------------------------------------------------------------------  Cardiac Enzymes No results for input(s): "TROPONINI" in the last 168 hours. ------------------------------------------------------------------------------------------------------------------  RADIOLOGY:  CT Head Wo Contrast  Result Date: 04/16/2022 CLINICAL DATA:  Provided history: Mental status change, unknown cause. EXAM: CT HEAD WITHOUT CONTRAST TECHNIQUE: Contiguous axial images were obtained from the base of the skull through the vertex without intravenous contrast. RADIATION DOSE REDUCTION: This exam was performed according to the departmental dose-optimization program which includes automated exposure control, adjustment of the mA and/or kV according to patient size and/or use of iterative reconstruction technique. COMPARISON:  Brain MRI 12/20/2018 FINDINGS: Brain: Mild cerebral atrophy. 3.5 x 3.4 cm hyperdense extra-axial mass overlying the mid-to-posterior left frontal lobe compatible with a meningioma. The mass has increased in size from the prior brain MRI of 12/20/2018 (previously 3.0 x 3.2 cm, remeasured on prior). There is mass effect upon the underlying brain parenchyma without definite underlying parenchymal edema. No significant ventricular effacement. No midline shift. Moderate patchy and ill-defined hypoattenuation within the cerebral white matter, nonspecific but compatible with chronic small vessel ischemic disease. There is no acute intracranial  hemorrhage. No demarcated cortical infarct. No extra-axial fluid collection. Vascular: No hyperdense vessel.  Atherosclerotic calcifications. Skull: 7 mm bony protuberance projecting outward from the frontal calvarium near midline, likely reflecting an osteoma. No calvarial fracture. Sinuses/Orbits: No orbital mass or acute orbital finding. No significant paranasal sinus disease at the imaged levels. IMPRESSION: 1. No evidence of acute infarct or acute intracranial hemorrhage. 2. 3.5 x 3.4 cm meningioma overlying the mid-to-posterior left frontal lobe, increased in size from the prior brain MRI of 12/20/2018. There is mass effect upon the underlying brain parenchyma without definite underlying parenchymal edema. No significant ventricular effacement. No midline shift. 3. Background parenchymal atrophy and chronic small vessel disease as described. Electronically Signed   By: Kellie Simmering D.O.   On: 04/16/2022 17:27      IMPRESSION AND PLAN:  Assessment and Plan: Acute kidney injury superimposed on chronic kidney disease ([place]) - The patient  will be admitted to an observation medical bed. - We will continue hydration with IV normal saline and follow BMP. - We will avoid nephrotoxins. - This is likely secondary to recent anorexia associated with diarrhea.  Acute lower UTI (urinary tract infection) - The patient will be placed on IV Rocephin. - Will follow urine culture and sensitivity.  Generalized weakness - This is likely secondary to #1 and #2. - Management as above. - PT consult will be obtained. - Dietitian consult to be obtained given recent anorexia.  Meningioma [facility]) - Neurosurgery PA Alli Consentino was consulted by EDP who discussed lab and imaging findings as well as pertinent plan - they recommend: giving dose of decadron and follow with neurosurgery outpatient. She does not think patient needs anything done while inpatient/emergently.     Essential hypertension - Will continue  antihypertensives.   Paroxysmal atrial fibrillation (HCC) - We will continue Eliquis and Coreg.  DVT prophylaxis: Eliquis. Advanced Care Planning:  Code Status: full code. Family Communication:  The plan of care was discussed in details with the patient (and family). I answered all questions. The patient agreed to proceed with the above mentioned plan. Further management will depend upon hospital course. Disposition Plan: Back to previous home environment Consults called: none. All the records are reviewed and case discussed with ED provider.  Status is: Observation   I certify that at the time of admission, it is my clinical judgment that the patient will require inpatient hospital care extending more than 2 midnights.                            Dispo: The patient is from: [facility]              Anticipated d/c is to: [facility]              Patient currently is not medically stable to d/c.              Difficult to place patient: No  Christel Mormon M.D on 04/16/2022 at 9:54 PM  [facility]   From 7 PM-7 AM, contact night-coverage [email or web address]  CC: Primary care physician; Charlane Ferretti, MD

## 2022-04-17 DIAGNOSIS — E8809 Other disorders of plasma-protein metabolism, not elsewhere classified: Secondary | ICD-10-CM | POA: Diagnosis present

## 2022-04-17 DIAGNOSIS — E8729 Other acidosis: Secondary | ICD-10-CM | POA: Diagnosis not present

## 2022-04-17 DIAGNOSIS — J449 Chronic obstructive pulmonary disease, unspecified: Secondary | ICD-10-CM | POA: Diagnosis present

## 2022-04-17 DIAGNOSIS — L89152 Pressure ulcer of sacral region, stage 2: Secondary | ICD-10-CM | POA: Diagnosis present

## 2022-04-17 DIAGNOSIS — I5032 Chronic diastolic (congestive) heart failure: Secondary | ICD-10-CM | POA: Diagnosis present

## 2022-04-17 DIAGNOSIS — D329 Benign neoplasm of meninges, unspecified: Secondary | ICD-10-CM | POA: Diagnosis present

## 2022-04-17 DIAGNOSIS — K529 Noninfective gastroenteritis and colitis, unspecified: Secondary | ICD-10-CM | POA: Diagnosis present

## 2022-04-17 DIAGNOSIS — E43 Unspecified severe protein-calorie malnutrition: Secondary | ICD-10-CM | POA: Diagnosis present

## 2022-04-17 DIAGNOSIS — Z85820 Personal history of malignant melanoma of skin: Secondary | ICD-10-CM | POA: Diagnosis not present

## 2022-04-17 DIAGNOSIS — I444 Left anterior fascicular block: Secondary | ICD-10-CM | POA: Diagnosis present

## 2022-04-17 DIAGNOSIS — D649 Anemia, unspecified: Secondary | ICD-10-CM | POA: Diagnosis present

## 2022-04-17 DIAGNOSIS — N390 Urinary tract infection, site not specified: Secondary | ICD-10-CM | POA: Diagnosis not present

## 2022-04-17 DIAGNOSIS — N179 Acute kidney failure, unspecified: Secondary | ICD-10-CM | POA: Diagnosis present

## 2022-04-17 DIAGNOSIS — N1832 Chronic kidney disease, stage 3b: Secondary | ICD-10-CM | POA: Diagnosis present

## 2022-04-17 DIAGNOSIS — F32A Depression, unspecified: Secondary | ICD-10-CM | POA: Diagnosis present

## 2022-04-17 DIAGNOSIS — I130 Hypertensive heart and chronic kidney disease with heart failure and stage 1 through stage 4 chronic kidney disease, or unspecified chronic kidney disease: Secondary | ICD-10-CM | POA: Diagnosis present

## 2022-04-17 DIAGNOSIS — Z87891 Personal history of nicotine dependence: Secondary | ICD-10-CM | POA: Diagnosis not present

## 2022-04-17 DIAGNOSIS — B9620 Unspecified Escherichia coli [E. coli] as the cause of diseases classified elsewhere: Secondary | ICD-10-CM | POA: Diagnosis present

## 2022-04-17 DIAGNOSIS — I480 Paroxysmal atrial fibrillation: Secondary | ICD-10-CM | POA: Diagnosis present

## 2022-04-17 DIAGNOSIS — N3001 Acute cystitis with hematuria: Secondary | ICD-10-CM | POA: Diagnosis present

## 2022-04-17 DIAGNOSIS — Z8249 Family history of ischemic heart disease and other diseases of the circulatory system: Secondary | ICD-10-CM | POA: Diagnosis not present

## 2022-04-17 DIAGNOSIS — Z7901 Long term (current) use of anticoagulants: Secondary | ICD-10-CM | POA: Diagnosis not present

## 2022-04-17 DIAGNOSIS — R531 Weakness: Secondary | ICD-10-CM | POA: Diagnosis not present

## 2022-04-17 DIAGNOSIS — M069 Rheumatoid arthritis, unspecified: Secondary | ICD-10-CM | POA: Diagnosis present

## 2022-04-17 DIAGNOSIS — F0390 Unspecified dementia without behavioral disturbance: Secondary | ICD-10-CM | POA: Diagnosis not present

## 2022-04-17 DIAGNOSIS — E860 Dehydration: Secondary | ICD-10-CM | POA: Diagnosis present

## 2022-04-17 LAB — BASIC METABOLIC PANEL
Anion gap: 9 (ref 5–15)
BUN: 36 mg/dL — ABNORMAL HIGH (ref 8–23)
CO2: 25 mmol/L (ref 22–32)
Calcium: 8.6 mg/dL — ABNORMAL LOW (ref 8.9–10.3)
Chloride: 103 mmol/L (ref 98–111)
Creatinine, Ser: 1.28 mg/dL — ABNORMAL HIGH (ref 0.44–1.00)
GFR, Estimated: 40 mL/min — ABNORMAL LOW (ref >60)
Glucose, Bld: 147 mg/dL — ABNORMAL HIGH (ref 70–99)
Potassium: 3.4 mmol/L — ABNORMAL LOW (ref 3.5–5.1)
Sodium: 137 mmol/L (ref 135–145)

## 2022-04-17 LAB — CBC
HCT: 34.9 % — ABNORMAL LOW (ref 36.0–46.0)
Hemoglobin: 11.4 g/dL — ABNORMAL LOW (ref 12.0–15.0)
MCH: 30.7 pg (ref 26.0–34.0)
MCHC: 32.7 g/dL (ref 30.0–36.0)
MCV: 94.1 fL (ref 80.0–100.0)
Platelets: 201 10*3/uL (ref 150–400)
RBC: 3.71 MIL/uL — ABNORMAL LOW (ref 3.87–5.11)
RDW: 13.5 % (ref 11.5–15.5)
WBC: 3.2 10*3/uL — ABNORMAL LOW (ref 4.0–10.5)
nRBC: 0 % (ref 0.0–0.2)

## 2022-04-17 MED ORDER — BOOST PLUS PO LIQD
237.0000 mL | Freq: Three times a day (TID) | ORAL | Status: DC
  Administered 2022-04-18 – 2022-04-19 (×4): 237 mL via ORAL
  Filled 2022-04-17 (×8): qty 237

## 2022-04-17 MED ORDER — POTASSIUM CHLORIDE CRYS ER 20 MEQ PO TBCR
40.0000 meq | EXTENDED_RELEASE_TABLET | Freq: Two times a day (BID) | ORAL | Status: AC
  Administered 2022-04-17 (×2): 40 meq via ORAL
  Filled 2022-04-17 (×2): qty 2

## 2022-04-17 NOTE — Progress Notes (Signed)
Initial Nutrition Assessment  DOCUMENTATION CODES:   Severe malnutrition in context of chronic illness  INTERVENTION:   -Boost Plus TID- Each supplement provides 360kcal and 14g protein.     -Liberalize diet to regular given severe malnutrition.  NUTRITION DIAGNOSIS:   Severe Malnutrition related to chronic illness, diarrhea as evidenced by severe fat depletion, severe muscle depletion.  GOAL:   Patient will meet greater than or equal to 90% of their needs  MONITOR:   PO intake, Supplement acceptance, Labs, Weight trends, I & O's  REASON FOR ASSESSMENT:   Consult Assessment of nutrition requirement/status  ASSESSMENT:   89 y.o. Caucasian female with medical history significant for COPD, GERD, CHF, peripheral neuropathy, paroxysmal atrial fibrillation on Eliquis depression, hypertension, chronic diarrhea, s/p  colectomy due to diverticulitis and osteoarthritis, who presented to the ED with acute onset of generalized weakness.  Patient in room, no family at bedside. She was eating her breakfast of french toast, omelet and hot tea. Pt states  she eats but can't eat too much at a time. Plans to eat half of her omelet. Pt with no issues with swallowing. States she has a chronic issue with diarrhea following colon surgery "a long time ago". Wants to find a new GI doctor. States she is provided meals from her living facility. Likes when they have salmon on the menu. Pt may eat 1 meal a day she reports.  She drinks 1 Boost High Protein daily. Likes Boost and doesn't like Ensure which is ordered. Will switch to our Boost supplement.  Per weight records, no recent weight loss.  Medications: Imodium, KLOR -CON  Labs reviewed: CBGs: 84 Low K   NUTRITION - FOCUSED PHYSICAL EXAM:  Flowsheet Row Most Recent Value  Orbital Region Moderate depletion  Upper Arm Region Severe depletion  Thoracic and Lumbar Region Severe depletion  Buccal Region Severe depletion  Temple Region Severe  depletion  Clavicle Bone Region Severe depletion  Clavicle and Acromion Bone Region Severe depletion  Scapular Bone Region Severe depletion  Dorsal Hand Severe depletion  Patellar Region Severe depletion  Anterior Thigh Region Severe depletion  Posterior Calf Region Severe depletion  Edema (RD Assessment) None  Hair Reviewed  Eyes Reviewed  Mouth Reviewed  Skin Reviewed  [dry, flaky]       Diet Order:   Diet Order             Diet regular Room service appropriate? Yes; Fluid consistency: Thin  Diet effective now                   EDUCATION NEEDS:   No education needs have been identified at this time  Skin:  Skin Assessment: Skin Integrity Issues: Skin Integrity Issues:: Stage I Stage I: right buttocks  Last BM:  2/19  Height:   Ht Readings from Last 1 Encounters:  04/16/22 5' 6"$  (1.676 m)    Weight:   Wt Readings from Last 1 Encounters:  04/16/22 57.1 kg    BMI:  Body mass index is 20.32 kg/m.  Estimated Nutritional Needs:   Kcal:  1700-1900  Protein:  80-90g  Fluid:  1.7L/day  Clayton Bibles, MS, RD, LDN Inpatient Clinical Dietitian Contact information available via Amion

## 2022-04-17 NOTE — Progress Notes (Signed)
[name] SAT UP IN CHAIR from around 10;00 AM TO 19:00.

## 2022-04-17 NOTE — Progress Notes (Signed)
PROGRESS NOTE    Rachael CUDAHY  [id number] DOB: March 10, 1930 DOA: 04/16/2022 PCP: Charlane Ferretti, MD     Brief Narrative:   [name] PMHx COPD, GERD, CHF, peripheral neuropathy, Neuromuscular DO, Chronic Diastolic CHF, PAF on Eliquis Depression, Essential HTN, Chronic diarrhea, s/p  colectomy due to diverticulitis and osteoarthritis, CKD  Presented to the ED with acute onset of generalized weakness.  Per EMT she was not feeling like herself today and felt weaker than her normal.  She denies any nausea or vomiting or diarrhea though she has a history of chronic diarrhea.  She has been having urinary frequency and urgency with dysuria without hematuria or flank pain.  No abdominal pain or melena or bright red blood per rectum.  No chest pain or dyspnea or cough or wheezing.  No headache or dizziness or blurred vision.  No paresthesias or focal muscle weakness.  She has not been having much appetite.  No presyncope or syncope.   ED Course: When she came to the ER, BP was 168/91 with otherwise normal vital signs.  Labs revealed a BUN of 30 and creatinine of 1.49 with otherwise normal BMP.  CBC was within normal.  UA was positive for UTI.  Urine culture was sent.   EKG as reviewed by me : Normal sinus rhythm with a rate of 60 with PACs, left anterior fascicular block and Q waves anteriorly. Imaging: Noncontrasted CT scan revealed the following: 1. No evidence of acute infarct or acute intracranial hemorrhage. 2. 3.5 x 3.4 cm meningioma overlying the mid-to-posterior left frontal lobe, increased in size from the prior brain MRI of 12/20/2018. There is mass effect upon the underlying brain parenchyma without definite underlying parenchymal edema. No significant ventricular effacement. No midline shift. 3. Background parenchymal atrophy and chronic small vessel disease as described.   The patient was given 10 mg of IV Decadron, 1 g of IV Rocephin and 500 mL IV normal saline bolus.  She will be  admitted to a medical observation bed for further evaluation and management.   Subjective: A/O x 4 negative complaints.   Assessment & Plan: Covid vaccination;   Active Problems:   Acute kidney injury superimposed on chronic kidney disease (HCC)   Acute lower UTI (urinary tract infection)   Generalized weakness   Meningioma (HCC)   Essential hypertension   Paroxysmal atrial fibrillation (HCC)   Chronic diarrhea   Sacral decubitus ulcer, stage II (HCC)   Acute renal failure superimposed on stage 3b chronic kidney disease ([place])   Acute kidney injury superimposed CKD stage IIIb (baseline Cr ~1.21)   Lab Results  Component Value Date   CREATININE 1.28 (H) 04/17/2022   CREATININE 1.49 (H) 04/16/2022   CREATININE 0.95 12/29/2020   CREATININE 0.95 12/28/2020   CREATININE 1.21 (H) 12/27/2020  - We will continue hydration with IV normal saline and follow BMP. - We will avoid nephrotoxins. - This is likely secondary to recent anorexia associated with diarrhea.   Acute lower UTI (urinary tract infection) - The patient will be placed on IV Rocephin.  Empirically - Will follow urine culture and sensitivity.   Generalized weakness - This is likely secondary to #1 and #2. - Management as above. - PT consult will be obtained. - Dietitian consult to be obtained given recent anorexia.   Meningioma [facility]) - Neurosurgery PA Alli Consentino was consulted by EDP who discussed lab and imaging findings as well as pertinent plan - they recommend: giving dose of decadron  and follow with neurosurgery outpatient. She does not think patient needs anything done while inpatient/emergently.      Essential hypertension - Amlodipine 5 mg daily - Coreg 6.25 mgBID     Paroxysmal atrial fibrillation (HCC) - Eliquis 2.5 mgBID - See HTN  Hypokalemia - Potassium goal> 4 - 2/19 K-Dur 40 mEq x 2 doses   Stage II sacral decubitus ulcer Pressure Injury 12/20/18 Coccyx Medial Stage II -  Partial  thickness loss of dermis presenting as a shallow open ulcer with a red, pink wound bed without slough. pink, open skin, non-blanchable (Active)  12/20/18 1415  Location: Coccyx  Location Orientation: Medial  Staging: Stage II -  Partial thickness loss of dermis presenting as a shallow open ulcer with a red, pink wound bed without slough.  Wound Description (Comments): pink, open skin, non-blanchable  Present on Admission: Yes     Pressure Injury 04/16/22 Buttocks Right;Left;Medial Stage 1 -  Intact skin with non-blanchable redness of a localized area usually over a bony prominence. (Active)  04/16/22 2145  Location: Buttocks  Location Orientation: Right;Left;Medial  Staging: Stage 1 -  Intact skin with non-blanchable redness of a localized area usually over a bony prominence.  Wound Description (Comments):   Present on Admission: Yes  Dressing Type None (barrier cream, off-loading) 04/16/22 2145         Mobility Assessment (last 72 hours)     Mobility Assessment     Row Name 04/16/22 2243 04/16/22 2145         Does patient have an order for bedrest or is patient medically unstable No - Continue assessment No - Continue assessment      What is the highest level of mobility based on the progressive mobility assessment? Level 4 (Walks with assist in room) - Balance while marching in place and cannot step forward and back - Complete Level 4 (Walks with assist in room) - Balance while marching in place and cannot step forward and back - Complete                     DVT prophylaxis: Eliquis Code Status: Full Family Communication:  Status is: Inpatient    Dispo: The patient is from: Home              Anticipated d/c is to: Home              Anticipated d/c date is: 2 days              Patient currently is not medically stable to d/c.      Consultants:    Procedures/Significant Events:    I have personally reviewed and interpreted all radiology studies and my  findings are as above.  VENTILATOR SETTINGS:    Cultures 2/18 urine pending    Antimicrobials: Anti-infectives (From admission, onward)    Start     Dose/Rate Route Frequency Ordered Stop   04/17/22 1800  cefTRIAXone (ROCEPHIN) 1 g in sodium chloride 0.9 % 100 mL IVPB        1 g 200 mL/hr over 30 Minutes Intravenous Every 24 hours 04/16/22 1932     04/16/22 1845  cefTRIAXone (ROCEPHIN) 1 g in sodium chloride 0.9 % 100 mL IVPB        1 g 200 mL/hr over 30 Minutes Intravenous  Once 04/16/22 1833 04/16/22 2014         Devices    LINES / TUBES:  Continuous Infusions:  sodium chloride 100 mL/hr at 04/17/22 0643   cefTRIAXone (ROCEPHIN)  IV       Objective: Vitals:   04/16/22 2210 04/16/22 2211 04/17/22 0206 04/17/22 0620  BP: (!) 155/78 (!) 155/78 124/66 131/74  Pulse:  65 71 65  Resp:  14 16   Temp:  97.7 F (36.5 C) 97.6 F (36.4 C) 98 F (36.7 C)  TempSrc:  Oral Oral Oral  SpO2:  93% 95% 96%  Weight:      Height:        Intake/Output Summary (Last 24 hours) at 04/17/2022 0804 Last data filed at 04/17/2022 0221 Gross per 24 hour  Intake 862.02 ml  Output 100 ml  Net 762.02 ml   Filed Weights   04/16/22 2208  Weight: 57.1 kg    Examination:  General: A/O x 4, No acute respiratory distress Eyes: negative scleral hemorrhage, negative anisocoria, negative icterus ENT: Negative Runny nose, negative gingival bleeding, Neck:  Negative scars, masses, torticollis, lymphadenopathy, JVD Lungs: Clear to auscultation bilaterally without wheezes or crackles Cardiovascular: Regular rate and rhythm without murmur gallop or rub normal S1 and S2 Abdomen: negative abdominal pain, nondistended, positive soft, bowel sounds, no rebound, no ascites, no appreciable mass Extremities: No significant cyanosis, clubbing, or edema bilateral lower extremities Skin: Negative rashes, lesions, ulcers Psychiatric:  Negative depression, negative anxiety, negative fatigue,  negative mania  Central nervous system:  Cranial nerves II through XII intact, tongue/uvula midline, all extremities muscle strength 5/5, sensation intact throughout, negative dysarthria, negative expressive aphasia, negative receptive aphasia.  .     Data Reviewed: Care during the described time interval was provided by me .  I have reviewed this patient's available data, including medical history, events of note, physical examination, and all test results as part of my evaluation.  CBC: Recent Labs  Lab 04/16/22 1414 04/17/22 0454  WBC 5.6 3.2*  HGB 12.9 11.4*  HCT 40.5 34.9*  MCV 94.2 94.1  PLT 216 [id number]   Basic Metabolic Panel: Recent Labs  Lab 04/16/22 1414 04/17/22 0454  NA 140 137  K 4.0 3.4*  CL 99 103  CO2 30 25  GLUCOSE 108* 147*  BUN 30* 36*  CREATININE 1.49* 1.28*  CALCIUM 9.0 8.6*   GFR: Estimated Creatinine Clearance: 26.9 mL/min (A) (by C-G formula based on SCr of 1.28 mg/dL (H)). Liver Function Tests: No results for input(s): "AST", "ALT", "ALKPHOS", "BILITOT", "PROT", "ALBUMIN" in the last 168 hours. No results for input(s): "LIPASE", "AMYLASE" in the last 168 hours. No results for input(s): "AMMONIA" in the last 168 hours. Coagulation Profile: No results for input(s): "INR", "PROTIME" in the last 168 hours. Cardiac Enzymes: No results for input(s): "CKTOTAL", "CKMB", "CKMBINDEX", "TROPONINI" in the last 168 hours. BNP (last 3 results) No results for input(s): "PROBNP" in the last 8760 hours. HbA1C: No results for input(s): "HGBA1C" in the last 72 hours. CBG: Recent Labs  Lab 04/16/22 1502  GLUCAP 84   Lipid Profile: No results for input(s): "CHOL", "HDL", "LDLCALC", "TRIG", "CHOLHDL", "LDLDIRECT" in the last 72 hours. Thyroid Function Tests: No results for input(s): "TSH", "T4TOTAL", "FREET4", "T3FREE", "THYROIDAB" in the last 72 hours. Anemia Panel: No results for input(s): "VITAMINB12", "FOLATE", "FERRITIN", "TIBC", "IRON", "RETICCTPCT" in  the last 72 hours. Sepsis Labs: No results for input(s): "PROCALCITON", "LATICACIDVEN" in the last 168 hours.  No results found for this or any previous visit (from the past 240 hour(s)).       Radiology Studies: CT Head  Wo Contrast  Result Date: 04/16/2022 CLINICAL DATA:  Provided history: Mental status change, unknown cause. EXAM: CT HEAD WITHOUT CONTRAST TECHNIQUE: Contiguous axial images were obtained from the base of the skull through the vertex without intravenous contrast. RADIATION DOSE REDUCTION: This exam was performed according to the departmental dose-optimization program which includes automated exposure control, adjustment of the mA and/or kV according to patient size and/or use of iterative reconstruction technique. COMPARISON:  Brain MRI 12/20/2018 FINDINGS: Brain: Mild cerebral atrophy. 3.5 x 3.4 cm hyperdense extra-axial mass overlying the mid-to-posterior left frontal lobe compatible with a meningioma. The mass has increased in size from the prior brain MRI of 12/20/2018 (previously 3.0 x 3.2 cm, remeasured on prior). There is mass effect upon the underlying brain parenchyma without definite underlying parenchymal edema. No significant ventricular effacement. No midline shift. Moderate patchy and ill-defined hypoattenuation within the cerebral white matter, nonspecific but compatible with chronic small vessel ischemic disease. There is no acute intracranial hemorrhage. No demarcated cortical infarct. No extra-axial fluid collection. Vascular: No hyperdense vessel.  Atherosclerotic calcifications. Skull: 7 mm bony protuberance projecting outward from the frontal calvarium near midline, likely reflecting an osteoma. No calvarial fracture. Sinuses/Orbits: No orbital mass or acute orbital finding. No significant paranasal sinus disease at the imaged levels. IMPRESSION: 1. No evidence of acute infarct or acute intracranial hemorrhage. 2. 3.5 x 3.4 cm meningioma overlying the  mid-to-posterior left frontal lobe, increased in size from the prior brain MRI of 12/20/2018. There is mass effect upon the underlying brain parenchyma without definite underlying parenchymal edema. No significant ventricular effacement. No midline shift. 3. Background parenchymal atrophy and chronic small vessel disease as described. Electronically Signed   By: Kellie Simmering D.O.   On: 04/16/2022 17:27        Scheduled Meds:  amLODipine  5 mg Oral QHS   apixaban  2.5 mg Oral BID   carvedilol  6.25 mg Oral BID WC   dexamethasone (DECADRON) injection  6 mg Intravenous Q24H   feeding supplement  237 mL Oral Q24H   fluticasone  2 spray Each Nare Daily   loperamide  4 mg Oral BID AC   multivitamin with minerals  1 tablet Oral Daily   Continuous Infusions:  sodium chloride 100 mL/hr at 04/17/22 0643   cefTRIAXone (ROCEPHIN)  IV       LOS: 0 days    Time spent:40 min    Yardley Lekas, Geraldo Docker, MD [facility]   If 7PM-7AM, please contact night-coverage 04/17/2022, 8:04 AM

## 2022-04-17 NOTE — Evaluation (Signed)
Physical Therapy One Time Evaluation Patient Details Name: [name] MRN: [id number] DOB: 18-Nov-1930 Today's Date: 04/17/2022  History of Present Illness  89 y.o. Caucasian female admitted 04/16/22 for Acute kidney injury superimposed on chronic kidney disease and acute lower UTI.  Past medical history significant for COPD, GERD, CHF, peripheral neuropathy, paroxysmal atrial fibrillation on Eliquis depression, hypertension, chronic diarrhea, s/p colectomy due to diverticulitis and osteoarthritis.  Clinical Impression  Patient evaluated by Physical Therapy with no further acute PT needs identified. All education has been completed and the patient has no further questions.  See below for any follow-up Physical Therapy or equipment needs. PT is signing off. Thank you for this referral.  Pt able to ambulate in hallway with RW and appears at her baseline.  Pt reports she lives in "independent living NOT assisted."  Pt would benefit from staff ambulating with her during this admission however no further PT needs identified at this time.      Recommendations for follow up therapy are one component of a multi-disciplinary discharge planning process, led by the attending physician.  Recommendations may be updated based on patient status, additional functional criteria and insurance authorization.  Follow Up Recommendations No PT follow up      Assistance Recommended at Discharge    Patient can return home with the following       Equipment Recommendations None recommended by PT  Recommendations for Other Services       Functional Status Assessment Patient has not had a recent decline in their functional status     Precautions / Restrictions Precautions Precautions: Fall      Mobility  Bed Mobility Overal bed mobility: Needs Assistance Bed Mobility: Supine to Sit     Supine to sit: Supervision          Transfers Overall transfer level: Needs assistance Equipment used: Rolling  walker (2 wheels) Transfers: Sit to/from Stand Sit to Stand: Min guard, Supervision                Ambulation/Gait Ambulation/Gait assistance: Min guard, Supervision Gait Distance (Feet): 260 Feet Assistive device: Rolling walker (2 wheels) Gait Pattern/deviations: Step-through pattern, Decreased stride length, Trunk flexed       General Gait Details: pt appears at baseline, steady with RW, denies symptoms  Stairs            Wheelchair Mobility    Modified Rankin (Stroke Patients Only)       Balance Overall balance assessment: History of Falls                                           Pertinent Vitals/Pain Pain Assessment Pain Assessment: No/denies pain    Home Living Family/patient expects to be discharged to:: Private residence Living Arrangements: Alone   Type of Home: Independent living facility           Home Equipment: Conservation officer, nature (2 wheels) Additional Comments: [facility]    Prior Function Prior Level of Function : Independent/Modified Independent               ADLs Comments: housekeeping per ILF, pt ambulates to dining hall or cafe in her building for meals     Hand Dominance        Extremity/Trunk Assessment        Lower Extremity Assessment Lower Extremity Assessment: Overall WFL for  tasks assessed    Cervical / Trunk Assessment Cervical / Trunk Assessment: Kyphotic  Communication   Communication: No difficulties  Cognition Arousal/Alertness: Awake/alert Behavior During Therapy: WFL for tasks assessed/performed Overall Cognitive Status: Within Functional Limits for tasks assessed                                          General Comments      Exercises     Assessment/Plan    PT Assessment Patient does not need any further PT services  PT Problem List         PT Treatment Interventions      PT Goals (Current goals can be found in the Care Plan  section)  Acute Rehab PT Goals PT Goal Formulation: All assessment and education complete, DC therapy    Frequency       Co-evaluation               AM-PAC PT "6 Clicks" Mobility  Outcome Measure Help needed turning from your back to your side while in a flat bed without using bedrails?: None Help needed moving from lying on your back to sitting on the side of a flat bed without using bedrails?: None Help needed moving to and from a bed to a chair (including a wheelchair)?: None Help needed standing up from a chair using your arms (e.g., wheelchair or bedside chair)?: A Little Help needed to walk in hospital room?: A Little Help needed climbing 3-5 steps with a railing? : A Little 6 Click Score: 21    End of Session Equipment Utilized During Treatment: Gait belt Activity Tolerance: Patient tolerated treatment well Patient left: in chair;with call bell/phone within reach;with chair alarm set Nurse Communication: Mobility status PT Visit Diagnosis: Difficulty in walking, not elsewhere classified (R26.2)    Time: [id number] PT Time Calculation (min) (ACUTE ONLY): 17 min   Charges:   PT Evaluation $PT Eval Low Complexity: 1 Low        Kati PT, DPT Physical Therapist Acute Rehabilitation Services Preferred contact method: Secure Chat Weekend Pager Only: [phone] Office: [place] 04/17/2022, 11:59 AM

## 2022-04-18 DIAGNOSIS — K529 Noninfective gastroenteritis and colitis, unspecified: Secondary | ICD-10-CM

## 2022-04-18 DIAGNOSIS — B9620 Unspecified Escherichia coli [E. coli] as the cause of diseases classified elsewhere: Secondary | ICD-10-CM

## 2022-04-18 DIAGNOSIS — N390 Urinary tract infection, site not specified: Secondary | ICD-10-CM | POA: Diagnosis present

## 2022-04-18 DIAGNOSIS — L89152 Pressure ulcer of sacral region, stage 2: Secondary | ICD-10-CM

## 2022-04-18 DIAGNOSIS — F0390 Unspecified dementia without behavioral disturbance: Secondary | ICD-10-CM

## 2022-04-18 LAB — COMPREHENSIVE METABOLIC PANEL
ALT: 10 U/L (ref 0–44)
AST: 18 U/L (ref 15–41)
Albumin: 3.1 g/dL — ABNORMAL LOW (ref 3.5–5.0)
Alkaline Phosphatase: 59 U/L (ref 38–126)
Anion gap: 10 (ref 5–15)
BUN: 35 mg/dL — ABNORMAL HIGH (ref 8–23)
CO2: 22 mmol/L (ref 22–32)
Calcium: 9 mg/dL (ref 8.9–10.3)
Chloride: 104 mmol/L (ref 98–111)
Creatinine, Ser: 0.96 mg/dL (ref 0.44–1.00)
GFR, Estimated: 57 mL/min — ABNORMAL LOW (ref >60)
Glucose, Bld: 110 mg/dL — ABNORMAL HIGH (ref 70–99)
Potassium: 4.5 mmol/L (ref 3.5–5.1)
Sodium: 136 mmol/L (ref 135–145)
Total Bilirubin: 0.4 mg/dL (ref 0.3–1.2)
Total Protein: 5.9 g/dL — ABNORMAL LOW (ref 6.5–8.1)

## 2022-04-18 LAB — CBC WITH DIFFERENTIAL/PLATELET
Abs Immature Granulocytes: 0.05 10*3/uL (ref 0.00–0.07)
Basophils Absolute: 0 10*3/uL (ref 0.0–0.1)
Basophils Relative: 0 %
Eosinophils Absolute: 0 10*3/uL (ref 0.0–0.5)
Eosinophils Relative: 0 %
HCT: 36.4 % (ref 36.0–46.0)
Hemoglobin: 11.5 g/dL — ABNORMAL LOW (ref 12.0–15.0)
Immature Granulocytes: 1 %
Lymphocytes Relative: 9 %
Lymphs Abs: 0.9 10*3/uL (ref 0.7–4.0)
MCH: 29.9 pg (ref 26.0–34.0)
MCHC: 31.6 g/dL (ref 30.0–36.0)
MCV: 94.8 fL (ref 80.0–100.0)
Monocytes Absolute: 0.6 10*3/uL (ref 0.1–1.0)
Monocytes Relative: 6 %
Neutro Abs: 7.7 10*3/uL (ref 1.7–7.7)
Neutrophils Relative %: 84 %
Platelets: 202 10*3/uL (ref 150–400)
RBC: 3.84 MIL/uL — ABNORMAL LOW (ref 3.87–5.11)
RDW: 13.6 % (ref 11.5–15.5)
WBC: 9.2 10*3/uL (ref 4.0–10.5)
nRBC: 0 % (ref 0.0–0.2)

## 2022-04-18 LAB — PHOSPHORUS: Phosphorus: 2.9 mg/dL (ref 2.5–4.6)

## 2022-04-18 LAB — MAGNESIUM: Magnesium: 2.1 mg/dL (ref 1.7–2.4)

## 2022-04-18 MED ORDER — LOPERAMIDE HCL 2 MG PO CAPS
4.0000 mg | ORAL_CAPSULE | Freq: Four times a day (QID) | ORAL | Status: DC
  Administered 2022-04-18 – 2022-04-19 (×4): 4 mg via ORAL
  Filled 2022-04-18 (×3): qty 2

## 2022-04-18 MED ORDER — HYDRALAZINE HCL 20 MG/ML IJ SOLN
10.0000 mg | Freq: Four times a day (QID) | INTRAMUSCULAR | Status: DC | PRN
  Administered 2022-04-18: 10 mg via INTRAVENOUS
  Filled 2022-04-18: qty 1

## 2022-04-18 MED ORDER — LIP MEDEX EX OINT: TOPICAL_OINTMENT | CUTANEOUS | Status: DC | PRN

## 2022-04-18 MED ORDER — CEFDINIR 300 MG PO CAPS
300.0000 mg | ORAL_CAPSULE | Freq: Two times a day (BID) | ORAL | Status: DC
  Administered 2022-04-18 – 2022-04-19 (×2): 300 mg via ORAL
  Filled 2022-04-18 (×4): qty 1

## 2022-04-18 MED ORDER — AMLODIPINE BESYLATE 5 MG PO TABS
5.0000 mg | ORAL_TABLET | Freq: Once | ORAL | Status: AC
  Administered 2022-04-18: 5 mg via ORAL
  Filled 2022-04-18: qty 1

## 2022-04-18 NOTE — Plan of Care (Signed)
Patient is alert and oriented X4, all activities of daily needs met, patient educated on importance of wearing yellow non-skid socks for fall prevention, and educated on the importance of medication regimen. Patient frustrated about not having her home briefs and asked her friend to come drop some off for her. Call bell within reach, all medications administered per MD order and son called for report and update.   Problem: Education: Goal: Knowledge of General Education information will improve Description: Including pain rating scale, medication(s)/side effects and non-pharmacologic comfort measures Outcome: Completed/Met   Problem: Health Behavior/Discharge Planning: Goal: Ability to manage health-related needs will improve Outcome: Completed/Met   Problem: Clinical Measurements: Goal: Ability to maintain clinical measurements within normal limits will improve Outcome: Completed/Met Goal: Will remain free from infection Outcome: Completed/Met

## 2022-04-18 NOTE — Progress Notes (Addendum)
PROGRESS NOTE    DEDIE VLIETSTRA  [id number] DOB: Jul 12, 1930 DOA: 04/16/2022 PCP: Charlane Ferretti, MD     Brief Narrative:   [name] PMHx COPD, GERD, CHF, peripheral neuropathy, Neuromuscular DO, Chronic Diastolic CHF, PAF on Eliquis Depression, Essential HTN, Chronic diarrhea, s/p  colectomy due to diverticulitis and osteoarthritis, CKD  Presented to the ED with acute onset of generalized weakness.  Per EMT she was not feeling like herself today and felt weaker than her normal.  She denies any nausea or vomiting or diarrhea though she has a history of chronic diarrhea.  She has been having urinary frequency and urgency with dysuria without hematuria or flank pain.  No abdominal pain or melena or bright red blood per rectum.  No chest pain or dyspnea or cough or wheezing.  No headache or dizziness or blurred vision.  No paresthesias or focal muscle weakness.  She has not been having much appetite.  No presyncope or syncope.   ED Course: When she came to the ER, BP was 168/91 with otherwise normal vital signs.  Labs revealed a BUN of 30 and creatinine of 1.49 with otherwise normal BMP.  CBC was within normal.  UA was positive for UTI.  Urine culture was sent.   EKG as reviewed by me : Normal sinus rhythm with a rate of 60 with PACs, left anterior fascicular block and Q waves anteriorly. Imaging: Noncontrasted CT scan revealed the following: 1. No evidence of acute infarct or acute intracranial hemorrhage. 2. 3.5 x 3.4 cm meningioma overlying the mid-to-posterior left frontal lobe, increased in size from the prior brain MRI of 12/20/2018. There is mass effect upon the underlying brain parenchyma without definite underlying parenchymal edema. No significant ventricular effacement. No midline shift. 3. Background parenchymal atrophy and chronic small vessel disease as described.   The patient was given 10 mg of IV Decadron, 1 g of IV Rocephin and 500 mL IV normal saline bolus.  She will be  admitted to a medical observation bed for further evaluation and management.   Subjective: 2/20  patient is complaining about everything.  Patient complaining that she did not receive medication Imodium (medication has been ordered and she is receiving), per RN upset that we do not have the type of briefs that she normally uses.  Upset that she has rheumatoid arthritis.  NOTE patient's BP has been elevated/increasing overnight no action taken until A.m RN Alondra contacted me.   Assessment & Plan: Covid vaccination;   Principal Problem:   AKI (acute kidney injury) ([place]) Active Problems:   Acute kidney injury superimposed on chronic kidney disease ([place])   Acute lower UTI (urinary tract infection)   Generalized weakness   Meningioma (HCC)   Essential hypertension   Paroxysmal atrial fibrillation (H[facility])   Chronic diarrhea   Sacral decubitus ulcer, stage II ([place])   Acute renal failure superimposed on stage 3b chronic kidney disease (HCC)   E. coli UTI   Acute kidney injury superimposed CKD stage IIIb (baseline Cr ~1.21)   Lab Results  Component Value Date   CREATININE 0.96 04/18/2022   CREATININE 1.28 (H) 04/17/2022   CREATININE 1.49 (H) 04/16/2022   CREATININE 0.95 12/29/2020   CREATININE 0.95 12/28/2020  - We will continue hydration with IV normal saline and follow BMP. - We will avoid nephrotoxins. - This is likely secondary to recent anorexia associated with diarrhea. -Resolved   Positive E. coli UTI (urinary tract infection) - The patient will be placed  on IV Rocephin.  Empirically - 2/20 Sensitivity have not resulted from urine. -2/20 complete 5-day course antibiotics.  Have switched to p.o. medication patient in extreme hurry to be discharged   Generalized weakness - This is likely secondary to #1 and #2. -DC HCTZ can contribute to weakness - Dietitian consult to be obtained given recent anorexia.   [facility]) - Neurosurgery PA Alli Consentino was consulted  by EDP who discussed lab and imaging findings as well as pertinent plan - they recommend: giving dose of decadron and follow with neurosurgery outpatient. She does not think patient needs anything done while inpatient/emergently.      Essential hypertension - 2/20 increase Amlodipine 10 mg daily - Coreg 6.25 mg BID -HCTZ held secondary to patient's generalized weakness -2/20 would not add ACE/ARB given patient's AKI. -2/20 Hydralazine PRN SBP> 150 or DBP> 100  -2/20 patient may check out AMA prior to HTN control   Paroxysmal atrial fibrillation (HCC) - Eliquis 2.5 mgBID - See HTN  Chronic diarrhea - 2/20 increase to Imodium to maximum dose, second to patient stating she was not on any medication.  Dementia - 2/20 not officially diagnosed however exhibiting some signs and symptoms.  Refuses to believe that she is being given appropriate medication even though she has been told several times that she is receiving appropriate meds.  Hypokalemia - Potassium goal> 4 - 2/19 K-Dur 40 mEq x 2 doses   Stage II sacral decubitus ulcer Pressure Injury 12/20/18 Coccyx Medial Stage II -  Partial thickness loss of dermis presenting as a shallow open ulcer with a red, pink wound bed without slough. pink, open skin, non-blanchable (Active)  12/20/18 1415  Location: Coccyx  Location Orientation: Medial  Staging: Stage II -  Partial thickness loss of dermis presenting as a shallow open ulcer with a red, pink wound bed without slough.  Wound Description (Comments): pink, open skin, non-blanchable  Present on Admission: Yes     Pressure Injury 04/16/22 Buttocks Right;Left;Medial Stage 1 -  Intact skin with non-blanchable redness of a localized area usually over a bony prominence. (Active)  04/16/22 2145  Location: Buttocks  Location Orientation: Right;Left;Medial  Staging: Stage 1 -  Intact skin with non-blanchable redness of a localized area usually over a bony prominence.  Wound Description  (Comments):   Present on Admission: Yes  Dressing Type None 04/18/22 0900         Mobility Assessment (last 72 hours)     Mobility Assessment     Row Name 04/18/22 0900 04/17/22 1932 04/17/22 1300 04/17/22 0956 04/16/22 2243   Does patient have an order for bedrest or is patient medically unstable No - Continue assessment No - Continue assessment No - Continue assessment -- No - Continue assessment   What is the highest level of mobility based on the progressive mobility assessment? Level 5 (Walks with assist in room/hall) - Balance while stepping forward/back and can walk in room with assist - Complete Level 5 (Walks with assist in room/hall) - Balance while stepping forward/back and can walk in room with assist - Complete Level 5 (Walks with assist in room/hall) - Balance while stepping forward/back and can walk in room with assist - Complete Level 5 (Walks with assist in room/hall) - Balance while stepping forward/back and can walk in room with assist - Complete Level 4 (Walks with assist in room) - Balance while marching in place and cannot step forward and back - Complete    Row Name 04/16/22  2145           Does patient have an order for bedrest or is patient medically unstable No - Continue assessment       What is the highest level of mobility based on the progressive mobility assessment? Level 4 (Walks with assist in room) - Balance while marching in place and cannot step forward and back - Complete                      DVT prophylaxis: Eliquis Code Status: Full Family Communication:  Status is: Inpatient    Dispo: The patient is from: Home              Anticipated d/c is to: Home              Anticipated d/c date is: 2 days              Patient currently is not medically stable to d/c.      Consultants:    Procedures/Significant Events:    I have personally reviewed and interpreted all radiology studies and my findings are as above.  VENTILATOR  SETTINGS:    Cultures 2/18 urine Positive E. coli    Antimicrobials: Anti-infectives (From admission, onward)    Start     Ordered Stop   04/18/22 2000  cefdinir (OMNICEF) capsule 300 mg        04/18/22 1824     04/17/22 1800  cefTRIAXone (ROCEPHIN) 1 g in sodium chloride 0.9 % 100 mL IVPB  Status:  Discontinued        04/16/22 1932 04/18/22 1824   04/16/22 1845  cefTRIAXone (ROCEPHIN) 1 g in sodium chloride 0.9 % 100 mL IVPB        04/16/22 1833 04/16/22 2014         Devices    LINES / TUBES:      Continuous Infusions:  sodium chloride 100 mL/hr at 04/18/22 0345     Objective: Vitals:   04/18/22 0517 04/18/22 0724 04/18/22 0726 04/18/22 1447  BP: (!) 180/78 (!) 182/102 (!) 186/93 139/68  Pulse: 64  68 75  Resp: 18   18  Temp: 97.8 F (36.6 C)     TempSrc: Oral     SpO2: 97%   98%  Weight:      Height:        Intake/Output Summary (Last 24 hours) at 04/18/2022 1831 Last data filed at 04/18/2022 1300 Gross per 24 hour  Intake 1639.69 ml  Output 400 ml  Net 1239.69 ml   Filed Weights   04/16/22 2208  Weight: 57.1 kg   Physical Exam:  General: A/O x 4, however patient exhibiting some signs of dementia.  No acute respiratory distress Eyes: negative scleral hemorrhage, negative anisocoria, negative icterus ENT: Negative Runny nose, negative gingival bleeding, Neck:  Negative scars, masses, torticollis, lymphadenopathy, JVD Lungs: Clear to auscultation bilaterally without wheezes or crackles Cardiovascular: Regular rate and rhythm without murmur gallop or rub normal S1 and S2 Abdomen: negative abdominal pain, nondistended, positive soft, bowel sounds, no rebound, no ascites, no appreciable mass Extremities: No significant cyanosis, clubbing, or edema bilateral lower extremities Skin: Negative rashes, lesions, ulcers Psychiatric:  Negative depression, negative anxiety, negative fatigue, negative mania, may have some dementia.  Even though being told  that she has taken different medications states people have withheld medication from her. Central nervous system:  Cranial nerves II through XII intact, tongue/uvula midline, all extremities muscle strength  5/5, sensation intact throughout, negative dysarthria, negative expressive aphasia, negative receptive aphasia.   .     Data Reviewed: Care during the described time interval was provided by me .  I have reviewed this patient's available data, including medical history, events of note, physical examination, and all test results as part of my evaluation.  CBC: Recent Labs  Lab 04/16/22 1414 04/17/22 0454 04/18/22 0732  WBC 5.6 3.2* 9.2  NEUTROABS  --   --  7.7  HGB 12.9 11.4* 11.5*  HCT 40.5 34.9* 36.4  MCV 94.2 94.1 94.8  PLT 216 201 [id number]   Basic Metabolic Panel: Recent Labs  Lab 04/16/22 1414 04/17/22 0454 04/18/22 0732  NA 140 137 136  K 4.0 3.4* 4.5  CL 99 103 104  CO2 30 25 22  $ GLUCOSE 108* 147* 110*  BUN 30* 36* 35*  CREATININE 1.49* 1.28* 0.96  CALCIUM 9.0 8.6* 9.0  MG  --   --  2.1  PHOS  --   --  2.9   GFR: Estimated Creatinine Clearance: 35.8 mL/min (by C-G formula based on SCr of 0.96 mg/dL). Liver Function Tests: Recent Labs  Lab 04/18/22 0732  AST 18  ALT 10  ALKPHOS 59  BILITOT 0.4  PROT 5.9*  ALBUMIN 3.1*   No results for input(s): "LIPASE", "AMYLASE" in the last 168 hours. No results for input(s): "AMMONIA" in the last 168 hours. Coagulation Profile: No results for input(s): "INR", "PROTIME" in the last 168 hours. Cardiac Enzymes: No results for input(s): "CKTOTAL", "CKMB", "CKMBINDEX", "TROPONINI" in the last 168 hours. BNP (last 3 results) No results for input(s): "PROBNP" in the last 8760 hours. HbA1C: No results for input(s): "HGBA1C" in the last 72 hours. CBG: Recent Labs  Lab 04/16/22 1502  GLUCAP 84   Lipid Profile: No results for input(s): "CHOL", "HDL", "LDLCALC", "TRIG", "CHOLHDL", "LDLDIRECT" in the last 72  hours. Thyroid Function Tests: No results for input(s): "TSH", "T4TOTAL", "FREET4", "T3FREE", "THYROIDAB" in the last 72 hours. Anemia Panel: No results for input(s): "VITAMINB12", "FOLATE", "FERRITIN", "TIBC", "IRON", "RETICCTPCT" in the last 72 hours. Sepsis Labs: No results for input(s): "PROCALCITON", "LATICACIDVEN" in the last 168 hours.  Recent Results (from the past 240 hour(s))  Urine Culture (for pregnant, neutropenic or urologic patients or patients with an indwelling urinary catheter)     Status: Abnormal (Preliminary result)   Collection Time: 04/16/22  6:22 PM   Specimen: Urine, Clean Catch  Result Value Ref Range Status   Specimen Description   Final    URINE, CLEAN CATCH Performed at [facility], [place] [address] [place], [place] [address]    Special Requests   Final    NONE Performed at [facility], [place] [address] [place], [place] [address]    Culture >=100,000 COLONIES/mL ESCHERICHIA COLI (A)  Final   Report Status PENDING  Incomplete         Radiology Studies: No results found.      Scheduled Meds:  amLODipine  5 mg Oral QHS   apixaban  2.5 mg Oral BID   carvedilol  6.25 mg Oral BID WC   cefdinir  300 mg Oral Q12H   dexamethasone (DECADRON) injection  6 mg Intravenous Q24H   fluticasone  2 spray Each Nare Daily   lactose free nutrition  237 mL Oral TID WC   loperamide  4 mg Oral QID   multivitamin with minerals  1 tablet Oral Daily   Continuous Infusions:  sodium  chloride 100 mL/hr at 04/18/22 0345     LOS: 1 day    Time spent:40 min    Reeva Davern, Geraldo Docker, MD [facility]   If 7PM-7AM, please contact night-coverage 04/18/2022, 6:31 PM

## 2022-04-19 LAB — CBC WITH DIFFERENTIAL/PLATELET
Abs Immature Granulocytes: 0.06 10*3/uL (ref 0.00–0.07)
Basophils Absolute: 0 10*3/uL (ref 0.0–0.1)
Basophils Relative: 0 %
Eosinophils Absolute: 0 10*3/uL (ref 0.0–0.5)
Eosinophils Relative: 0 %
HCT: 34.2 % — ABNORMAL LOW (ref 36.0–46.0)
Hemoglobin: 11 g/dL — ABNORMAL LOW (ref 12.0–15.0)
Immature Granulocytes: 1 %
Lymphocytes Relative: 13 %
Lymphs Abs: 1.1 10*3/uL (ref 0.7–4.0)
MCH: 30.6 pg (ref 26.0–34.0)
MCHC: 32.2 g/dL (ref 30.0–36.0)
MCV: 95.3 fL (ref 80.0–100.0)
Monocytes Absolute: 0.6 10*3/uL (ref 0.1–1.0)
Monocytes Relative: 7 %
Neutro Abs: 7 10*3/uL (ref 1.7–7.7)
Neutrophils Relative %: 79 %
Platelets: 202 10*3/uL (ref 150–400)
RBC: 3.59 MIL/uL — ABNORMAL LOW (ref 3.87–5.11)
RDW: 13.9 % (ref 11.5–15.5)
WBC: 8.8 10*3/uL (ref 4.0–10.5)
nRBC: 0 % (ref 0.0–0.2)

## 2022-04-19 LAB — COMPREHENSIVE METABOLIC PANEL
ALT: 10 U/L (ref 0–44)
AST: 18 U/L (ref 15–41)
Albumin: 2.6 g/dL — ABNORMAL LOW (ref 3.5–5.0)
Alkaline Phosphatase: 65 U/L (ref 38–126)
Anion gap: 11 (ref 5–15)
BUN: 36 mg/dL — ABNORMAL HIGH (ref 8–23)
CO2: 21 mmol/L — ABNORMAL LOW (ref 22–32)
Calcium: 8.5 mg/dL — ABNORMAL LOW (ref 8.9–10.3)
Chloride: 106 mmol/L (ref 98–111)
Creatinine, Ser: 0.97 mg/dL (ref 0.44–1.00)
GFR, Estimated: 56 mL/min — ABNORMAL LOW (ref >60)
Glucose, Bld: 89 mg/dL (ref 70–99)
Potassium: 4.2 mmol/L (ref 3.5–5.1)
Sodium: 138 mmol/L (ref 135–145)
Total Bilirubin: 0.2 mg/dL — ABNORMAL LOW (ref 0.3–1.2)
Total Protein: 5.5 g/dL — ABNORMAL LOW (ref 6.5–8.1)

## 2022-04-19 LAB — URINE CULTURE: Culture: >=100000 — AB

## 2022-04-19 LAB — MAGNESIUM: Magnesium: 2 mg/dL (ref 1.7–2.4)

## 2022-04-19 LAB — PHOSPHORUS: Phosphorus: 2.6 mg/dL (ref 2.5–4.6)

## 2022-04-19 MED ORDER — ONDANSETRON HCL 4 MG PO TABS: 4.0000 mg | ORAL_TABLET | Freq: Four times a day (QID) | ORAL | 0 refills | Status: DC | PRN

## 2022-04-19 MED ORDER — CEFDINIR 300 MG PO CAPS: 300.0000 mg | ORAL_CAPSULE | Freq: Two times a day (BID) | ORAL | 0 refills | Status: AC

## 2022-04-19 NOTE — Discharge Summary (Signed)
Physician Discharge Summary   Patient: [name] MRN: [id number] DOB: 21-Dec-1930  Admit date:     04/16/2022  Discharge date: 04/19/2022  Discharge Physician: Rachael Noble, DO   PCP: Rachael Ferretti, MD   Recommendations at discharge:   Follow-up with PCP within 1 to 2 weeks and repeat CBC, CMP, mag, Phos within 1 week Follow-up with neurosurgery and neuro-oncology in outpatient setting for meningioma Follow-up with gastroenterology and establish with [facility] in 1 to 2 weeks for history of diarrhea  Discharge Diagnoses: Principal Problem:   AKI (acute kidney injury) ([place]) Active Problems:   Acute kidney injury superimposed on chronic kidney disease ([place])   Acute lower UTI (urinary tract infection)   Generalized weakness   Meningioma ([place])   Essential hypertension   Paroxysmal atrial fibrillation (HCC)   Chronic diarrhea   Sacral decubitus ulcer, stage II ([place])   Acute renal failure superimposed on stage 3b chronic kidney disease ([place])   E. coli UTI  Resolved Problems:   * No resolved hospital problems. [facility] Course: [name] PMHx COPD, GERD, CHF, peripheral neuropathy, Neuromuscular DO, Chronic Diastolic CHF, PAF on Eliquis Depression, Essential HTN, Chronic diarrhea, s/p  colectomy due to diverticulitis and osteoarthritis, CKD   Presented to the ED with acute onset of generalized weakness.  Per EMT she was not feeling like herself today and felt weaker than her normal.  She denies any nausea or vomiting or diarrhea though she has a history of chronic diarrhea.  She has been having urinary frequency and urgency with dysuria without hematuria or flank pain.  No abdominal pain or melena or bright red blood per rectum.  No chest pain or dyspnea or cough or wheezing.  No headache or dizziness or blurred vision.  No paresthesias or focal muscle weakness.  She has not been having much appetite.  No presyncope or syncope.   ED Course: When she came to the ER, BP was  168/91 with otherwise normal vital signs.  Labs revealed a BUN of 30 and creatinine of 1.49 with otherwise normal BMP.  CBC was within normal.  UA was positive for UTI.  Urine culture was sent.   EKG as reviewed by me : Normal sinus rhythm with a rate of 60 with PACs, left anterior fascicular block and Q waves anteriorly. Imaging: Noncontrasted CT scan revealed the following: 1. No evidence of acute infarct or acute intracranial hemorrhage. 2. 3.5 x 3.4 cm meningioma overlying the mid-to-posterior left frontal lobe, increased in size from the prior brain MRI of 12/20/2018. There is mass effect upon the underlying brain parenchyma without definite underlying parenchymal edema. No significant ventricular effacement. No midline shift. 3. Background parenchymal atrophy and chronic small vessel disease as described.   The patient was given 10 mg of IV Decadron, 1 g of IV Rocephin and 500 mL IV normal saline bolus.  She will be admitted to a medical observation bed for further evaluation and management.    **Interim History She is transition from IV antibiotics to oral antibiotics for her UTI.  Her symptoms improved and she continued to have some diarrhea which was chronic and requested that she follow-up with GI in outpatient setting.  She did well and she is medically stable for discharge at this time and will need to follow-up with PCP.  I discussed the case with neuro-oncology about her meningioma and they said supportive care and outpatient follow-up and recommended no steroids for discharge.  Assessment  and Plan:   AKI superimposed on CKD Stage 3b [place] acidosis --BUN/Cr Trend: Recent Labs  Lab 04/16/22 1414 04/17/22 0454 04/18/22 0732 04/19/22 0604  BUN 30* 36* 35* 36*  CREATININE 1.49* 1.28* 0.96 0.97  -IV fluid hydration while she was here and has now stopped.  She has a slight metabolic acidosis with a CO2 of 21, anion gap of 11, chloride level 106 -Avoid Nephrotoxic  Medications, Contrast Dyes, Hypotension and Dehydration to Ensure Adequate Renal Perfusion and will need to Renally Adjust Meds -Continue to Monitor and Trend Renal Function carefully and repeat within a week    Positive E. coli UTI (urinary tract infection) - The patient will be placed on IV Rocephin.  Empirically - 2/20 Sensitivity have not resulted from urine. -2/20 complete 7-day course antibiotics.  Have switched to p.o. medication patient in extreme hurry to be discharged   Generalized weakness - This is likely secondary to #1 and #2. -DC HCTZ can contribute to weakness - Dietitian consult to be obtained given recent anorexia. -PT OT done and recommending no follow-up   Meningioma [facility]) - Neurosurgery PA [name] was consulted by EDP who discussed lab and imaging findings as well as pertinent plan - they recommend: giving dose of decadron and follow with neurosurgery outpatient. She does not think patient needs anything done while inpatient/emergently.  -I also spoke with neuro-oncology Dr. Mickeal [name] who recommends no steroids for discharge and outpatient follow-up   Essential hypertension - 2/20 increase Amlodipine 10 mg daily - Coreg 6.25 mg BID -HCTZ held secondary to patient's generalized weakness and will hold at discharge -2/20 would not add ACE/ARB given patient's AKI. -2/20 Hydralazine PRN SBP> 150 or DBP> 100  -2/20 patient may check out AMA prior to HTN control   Paroxysmal atrial fibrillation (HCC) - Eliquis 2.5 mgBID - Continue to during telemetry   Chronic diarrhea - 2/20 increase to Imodium to maximum dose, second to patient stating she was not on any medication. -Patient request to follow-up with gastroenterology in outpatient setting so referral has been made to [facility] as she has been dismissed from both Dr. Lorie [name] office and Dr. Erlinda [name] office   Dementia - 2/20 not officially diagnosed however exhibiting some signs and symptoms.  Refuses to  believe that she is being given appropriate medication even though she has been told several times that she is receiving appropriate meds. -Patient follow-up   Hypokalemia -Patient's K+ Level Trend: Recent Labs  Lab 04/16/22 1414 04/17/22 0454 04/18/22 0732 04/19/22 0604  K 4.0 3.4* 4.5 4.2  -Continue to Monitor and Replete as Necessary -Repeat CMP in the AM   Stage II sacral decubitus ulcer Pressure Injury 04/16/22 Buttocks Right;Left;Medial Stage 1 -  Intact skin with non-blanchable redness of a localized area usually over a bony prominence. (Active)  04/16/22 2145  Location: Buttocks  Location Orientation: Right;Left;Medial  Staging: Stage 1 -  Intact skin with non-blanchable redness of a localized area usually over a bony prominence.  Wound Description (Comments):   Present on Admission: Yes   Hypoalbuminemia -Patient's Albumin Trend: Recent Labs  Lab 04/18/22 0732 04/19/22 0604  ALBUMIN 3.1* 2.6*  -Continue to Monitor and Trend and repeat CMP in the outpatient setting  Normocytic Anemia -Hgb/Hct Trend: Recent Labs  Lab 04/16/22 1414 04/17/22 0454 04/18/22 0732 04/19/22 0604  HGB 12.9 11.4* 11.5* 11.0*  HCT 40.5 34.9* 36.4 34.2*  MCV 94.2 94.1 94.8 95.3  -Outpatient follow up with PCP and outpatient Anemia Panel  Severe Malnutrition in the context of Chronic illness Nutrition Documentation    [place] ED to Hosp-Admission (Discharged) from 04/16/2022 in [place] [facility]  Nutrition Problem Severe Malnutrition  Etiology chronic illness, diarrhea  Nutrition Goal Patient will meet greater than or equal to 90% of their needs  Interventions Boost Plus, MVI, Liberalize Diet     ,  Active Pressure Injury/Wound(s)     Pressure Ulcer  Duration          Pressure Injury 12/20/18 Coccyx Medial Stage II -  Partial thickness loss of dermis presenting as a shallow open ulcer with a red, pink wound bed without slough. pink, open skin, non-blanchable  1216 days   Pressure Injury 04/16/22 Buttocks Right;Left;Medial Stage 1 -  Intact skin with non-blanchable redness of a localized area usually over a bony prominence. 2 days           Consultants: None Procedures performed: As delineated as above Disposition: Home Diet recommendation:  Discharge Diet Orders (From admission, onward)     Start     Ordered   04/19/22 0000  Diet - low sodium heart healthy        04/19/22 1203           Cardiac and Carb modified diet DISCHARGE MEDICATION: Allergies as of 04/19/2022       Reactions   Amoxicillin-pot Clavulanate Diarrhea   diarrhea   Cephalexin Diarrhea   Ciprofloxacin Diarrhea   Hydromorphone    Pt does not remember   Lisinopril    fatigue   Mirtazapine    Pt doesn't remember.     Nitrofuran Derivatives Diarrhea   Septra [bactrim]    Pt doesn't remember.    Sulfamethoxazole-trimethoprim         Medication List     TAKE these medications    acetaminophen 650 MG CR tablet Commonly known as: TYLENOL Take 650 mg by mouth every 8 (eight) hours as needed for pain.   amLODipine 5 MG tablet Commonly known as: NORVASC Take 5 mg by mouth at bedtime.   apixaban 2.5 MG Tabs tablet Commonly known as: ELIQUIS Take 1 tablet (2.5 mg total) by mouth 2 (two) times daily.   carvedilol 6.25 MG tablet Commonly known as: COREG Take 6.25 mg by mouth 2 (two) times daily.   cefdinir 300 MG capsule Commonly known as: OMNICEF Take 1 capsule (300 mg total) by mouth every 12 (twelve) hours for 4 days.   feeding supplement Liqd Take 1 Container by mouth daily.   fluticasone 50 MCG/ACT nasal spray Commonly known as: FLONASE Place 2 sprays into both nostrils daily.   hydrochlorothiazide 12.5 MG tablet Commonly known as: HYDRODIURIL Take 12.5 mg by mouth daily.   loperamide 2 MG capsule Commonly known as: IMODIUM Take 1 capsule (2 mg total) by mouth 4 (four) times daily as needed for diarrhea or loose stools. What  changed:  how much to take when to take this   loratadine 10 MG tablet Commonly known as: CLARITIN Take 1 tablet (10 mg total) by mouth daily.   multivitamin with minerals Tabs tablet Take 1 tablet by mouth daily.   ondansetron 4 MG tablet Commonly known as: ZOFRAN Take 1 tablet (4 mg total) by mouth every 6 (six) hours as needed for nausea.               Discharge Care Instructions  (From admission, onward)           Start  Ordered   04/19/22 0000  Discharge wound care:       Comments: Frequent turning and pressure offloading   04/19/22 1203            Discharge Exam: Filed Weights   04/16/22 2208  Weight: 57.1 kg   Vitals:   04/18/22 2210 04/19/22 0857  BP: (!) 147/76 (!) 155/75  Pulse: 70 68  Resp:  18  Temp:  97.6 F (36.4 C)  SpO2:     Examination: Physical Exam:  Constitutional: Thin elderly Caucasian female, in no acute distress Respiratory: Diminished to auscultation bilaterally, no wheezing, rales, rhonchi or crackles. Normal respiratory effort and patient is not tachypenic. No accessory muscle use.  Unlabored breathing Cardiovascular: RRR, no murmurs / rubs / gallops. S1 and S2 auscultated. No extremity edema.  Abdomen: Soft, non-tender, non-distended. Bowel sounds positive.  GU: Deferred. Musculoskeletal: No clubbing / cyanosis of digits/nails. No joint deformity upper and lower extremities. Skin: No rashes, lesions, ulcers limited skin evaluation. No induration; Warm and dry.  Neurologic: CN 2-12 grossly intact with no focal deficits. Romberg sign and cerebellar reflexes not assessed.  Psychiatric: Normal judgment and insight. Alert and oriented x 3. Normal mood and appropriate affect.   Condition at discharge: stable  The results of significant diagnostics from this hospitalization (including imaging, microbiology, ancillary and laboratory) are listed below for reference.   Imaging Studies: CT Head Wo Contrast  Result Date:  04/16/2022 CLINICAL DATA:  Provided history: Mental status change, unknown cause. EXAM: CT HEAD WITHOUT CONTRAST TECHNIQUE: Contiguous axial images were obtained from the base of the skull through the vertex without intravenous contrast. RADIATION DOSE REDUCTION: This exam was performed according to the departmental dose-optimization program which includes automated exposure control, adjustment of the mA and/or kV according to patient size and/or use of iterative reconstruction technique. COMPARISON:  Brain MRI 12/20/2018 FINDINGS: Brain: Mild cerebral atrophy. 3.5 x 3.4 cm hyperdense extra-axial mass overlying the mid-to-posterior left frontal lobe compatible with a meningioma. The mass has increased in size from the prior brain MRI of 12/20/2018 (previously 3.0 x 3.2 cm, remeasured on prior). There is mass effect upon the underlying brain parenchyma without definite underlying parenchymal edema. No significant ventricular effacement. No midline shift. Moderate patchy and ill-defined hypoattenuation within the cerebral white matter, nonspecific but compatible with chronic small vessel ischemic disease. There is no acute intracranial hemorrhage. No demarcated cortical infarct. No extra-axial fluid collection. Vascular: No hyperdense vessel.  Atherosclerotic calcifications. Skull: 7 mm bony protuberance projecting outward from the frontal calvarium near midline, likely reflecting an osteoma. No calvarial fracture. Sinuses/Orbits: No orbital mass or acute orbital finding. No significant paranasal sinus disease at the imaged levels. IMPRESSION: 1. No evidence of acute infarct or acute intracranial hemorrhage. 2. 3.5 x 3.4 cm meningioma overlying the mid-to-posterior left frontal lobe, increased in size from the prior brain MRI of 12/20/2018. There is mass effect upon the underlying brain parenchyma without definite underlying parenchymal edema. No significant ventricular effacement. No midline shift. 3. Background  parenchymal atrophy and chronic small vessel disease as described. Electronically Signed   By: Kellie Simmering D.O.   On: 04/16/2022 17:27   CUP PACEART REMOTE DEVICE CHECK  Result Date: 04/02/2022 ILR summary report received. Battery status OK. Normal device function. No new symptom, tachy, brady, or pause episodes. No new AF episodes.  AF burden is 0% of the time.   Monthly summary reports and ROV/PRN Kathy Breach, RN, CCDS, [employer]   Microbiology:  Results for orders placed or performed during the hospital encounter of 04/16/22  Urine Culture (for pregnant, neutropenic or urologic patients or patients with an indwelling urinary catheter)     Status: Abnormal   Collection Time: 04/16/22  6:22 PM   Specimen: Urine, Clean Catch  Result Value Ref Range Status   Specimen Description   Final    URINE, CLEAN CATCH Performed at [facility], [place] [address], [place], [place] [address]    Special Requests   Final    NONE Performed at [facility], [place] [address], [place], [place] [address]    Culture >=100,000 COLONIES/mL ESCHERICHIA COLI (A)  Final   Report Status 04/19/2022 FINAL  Final   Organism ID, Bacteria ESCHERICHIA COLI (A)  Final      Susceptibility   Escherichia coli - MIC*    AMPICILLIN >=32 RESISTANT Resistant     CEFAZOLIN <=4 SENSITIVE Sensitive     CEFEPIME <=0.12 SENSITIVE Sensitive     CEFTRIAXONE <=0.25 SENSITIVE Sensitive     CIPROFLOXACIN <=0.25 SENSITIVE Sensitive     GENTAMICIN <=1 SENSITIVE Sensitive     IMIPENEM <=0.25 SENSITIVE Sensitive     NITROFURANTOIN <=16 SENSITIVE Sensitive     TRIMETH/SULFA >=320 RESISTANT Resistant     AMPICILLIN/SULBACTAM >=32 RESISTANT Resistant     PIP/TAZO <=4 SENSITIVE Sensitive     * >=100,000 COLONIES/mL ESCHERICHIA COLI    Labs: CBC: Recent Labs  Lab 04/16/22 1414 04/17/22 0454 04/18/22 0732 04/19/22 0604  WBC 5.6 3.2* 9.2 8.8  NEUTROABS  --   --  7.7 7.0  HGB 12.9 11.4*  11.5* 11.0*  HCT 40.5 34.9* 36.4 34.2*  MCV 94.2 94.1 94.8 95.3  PLT 216 201 202 [id number]   Basic Metabolic Panel: Recent Labs  Lab 04/16/22 1414 04/17/22 0454 04/18/22 0732 04/19/22 0604  NA 140 137 136 138  K 4.0 3.4* 4.5 4.2  CL 99 103 104 106  CO2 '30 25 22 '$ 21*  GLUCOSE 108* 147* 110* 89  BUN 30* 36* 35* 36*  CREATININE 1.49* 1.28* 0.96 0.97  CALCIUM 9.0 8.6* 9.0 8.5*  MG  --   --  2.1 2.0  PHOS  --   --  2.9 2.6   Liver Function Tests: Recent Labs  Lab 04/18/22 0732 04/19/22 0604  AST 18 18  ALT 10 10  ALKPHOS 59 65  BILITOT 0.4 0.2*  PROT 5.9* 5.5*  ALBUMIN 3.1* 2.6*   CBG: Recent Labs  Lab 04/16/22 1502  GLUCAP 84    Discharge time spent: greater than 30 minutes.  Signed: Raiford Noble, DO [facility] 04/19/2022

## 2022-04-19 NOTE — Plan of Care (Signed)
Patient discharged and [employer] set up for transportation. All questions answered and PIV removed, no further concerns at this time.  Problem: Clinical Measurements: Goal: Diagnostic test results will improve Outcome: Completed/Met Goal: Respiratory complications will improve Outcome: Completed/Met Goal: Cardiovascular complication will be avoided Outcome: Completed/Met   Problem: Activity: Goal: Risk for activity intolerance will decrease Outcome: Completed/Met   Problem: Nutrition: Goal: Adequate nutrition will be maintained Outcome: Completed/Met   Problem: Coping: Goal: Level of anxiety will decrease Outcome: Completed/Met   Problem: Elimination: Goal: Will not experience complications related to bowel motility Outcome: Completed/Met Goal: Will not experience complications related to urinary retention Outcome: Completed/Met   Problem: Pain Managment: Goal: General experience of comfort will improve Outcome: Completed/Met   Problem: Safety: Goal: Ability to remain free from injury will improve Outcome: Completed/Met   Problem: Skin Integrity: Goal: Risk for impaired skin integrity will decrease Outcome: Completed/Met

## 2022-04-19 NOTE — Progress Notes (Signed)
Mobility Specialist - Progress Note   04/19/22 1038  Mobility  Activity Ambulated with assistance in hallway  Level of Assistance Standby assist, set-up cues, supervision of patient - no hands on  Assistive Device Front wheel walker  Distance Ambulated (ft) 480 ft  Activity Response Tolerated well  Mobility Referral Yes  $Mobility charge 1 Mobility   Pt received in bed and agreeable to mobility. Pt was MinA from sit-to-stand, IND for bed mobility & standby during ambulation. No complaints during session. Pt to bed after session with all needs met.    [facility]

## 2022-04-27 DIAGNOSIS — D329 Benign neoplasm of meninges, unspecified: Secondary | ICD-10-CM | POA: Diagnosis not present

## 2022-04-27 DIAGNOSIS — N183 Chronic kidney disease, stage 3 unspecified: Secondary | ICD-10-CM | POA: Diagnosis not present

## 2022-04-27 DIAGNOSIS — I480 Paroxysmal atrial fibrillation: Secondary | ICD-10-CM | POA: Diagnosis not present

## 2022-05-08 ENCOUNTER — Ambulatory Visit: Payer: Medicare Other

## 2022-05-08 DIAGNOSIS — I480 Paroxysmal atrial fibrillation: Secondary | ICD-10-CM

## 2022-05-08 LAB — CUP PACEART REMOTE DEVICE CHECK
Date Time Interrogation Session: 20240310231040
Implantable Pulse Generator Implant Date: 20221212

## 2022-05-12 NOTE — Progress Notes (Signed)
[facility] Summary Report / Loop Recorder 

## 2022-05-15 ENCOUNTER — Telehealth: Payer: Self-pay | Admitting: Internal Medicine

## 2022-05-15 NOTE — Telephone Encounter (Signed)
Hi Dr. Lorenso Courier,   Supervising Provider: 05/15/22 AM   We received a referral for patient to evaluated for Diarrhea due to malabsorption, she does have GI history with Dr. Paulita Fujita and requested a transfer of care because she had her colon removed a few years ago and she continues to have chronic diarrhea. Said Dr. Paulita Fujita has not been able to help her. Records were obtained for you to review and advise on scheduling.   Thanks

## 2022-05-17 ENCOUNTER — Encounter: Payer: Self-pay | Admitting: Internal Medicine

## 2022-05-17 NOTE — Progress Notes (Signed)
Reviewed records from [facility]. Patient has chronic diarrhea after subtotal colectomy. She is on Lomotil and glycopyrrolate, and has been tried on loperamide, cholestyramine, pancreatic enzymes, and antispasmodics in the past). Patient will need to be seen a tertiary care center such as [place], [place], or [facility] for further assistance.  EGD 10/14/13: Tortuous esophagus. Z-line irregular that was biopsied. Normal stomach. Normal duodenum.  Path: Gastroesophageal mucosa with chronic inflammation. No Barrett's esophagus.

## 2022-05-19 DIAGNOSIS — D496 Neoplasm of unspecified behavior of brain: Secondary | ICD-10-CM | POA: Diagnosis not present

## 2022-05-23 NOTE — Telephone Encounter (Signed)
Called patient to advise her of denial and recommendations.

## 2022-06-01 ENCOUNTER — Telehealth: Payer: Self-pay

## 2022-06-01 NOTE — Telephone Encounter (Signed)
Following alert received from [employer] received for There were 4 new AF episodes.  AF burden is 24% of the time.   The episodes were 6-18 minutes and the last one appears to be ongoing according to the presenting rhythm.  Previous reports state that the patient is on [place].  Ventricular rates are not good with AF as heart rates are 100-140 bpm according to histogram.   There were old tachy episodes that appear to be oversensing.    Patient reports last night she was spraying airfresher in the bathroom, lost her balance and fell. Denies dizziness or lightheadedness associated with fall. Also denies any injuries/loc. Reports she was able to scoot herself on the floor to the living room and laid there all night. States EMS came out, checked her heart rate and was lower. Patient did not go to ER. Advised/encouraged patient to call PCP for apt.for further eval.Patient stated she has a yard sale and needs to get some rest. Encouraged patient again to follow up. Advised I will forward to Dr. Sallyanne Kuster.

## 2022-06-05 NOTE — Telephone Encounter (Signed)
Called pt to get her in to be seen- apt's available Thursday this week- pacer check. No answer, left message and call back number

## 2022-06-06 NOTE — Telephone Encounter (Signed)
Called pt to see if she can come in this Thursday for appt; she states that she lives at a senior facility and has to give 72hrs notice and she already has an appt scheduled for this Thursday.  Scheduled appt for 06/15/22 at 1115

## 2022-06-12 ENCOUNTER — Ambulatory Visit (INDEPENDENT_AMBULATORY_CARE_PROVIDER_SITE_OTHER): Payer: Medicare Other

## 2022-06-12 DIAGNOSIS — I480 Paroxysmal atrial fibrillation: Secondary | ICD-10-CM | POA: Diagnosis not present

## 2022-06-12 LAB — CUP PACEART REMOTE DEVICE CHECK
Date Time Interrogation Session: 20240414231034
Implantable Pulse Generator Implant Date: 20221212

## 2022-06-14 NOTE — Progress Notes (Signed)
[facility] Summary Report / Loop Recorder 

## 2022-06-15 ENCOUNTER — Encounter: Payer: Self-pay | Admitting: Cardiovascular Disease

## 2022-06-15 ENCOUNTER — Ambulatory Visit: Payer: Medicare Other | Attending: Cardiovascular Disease | Admitting: Cardiovascular Disease

## 2022-06-15 VITALS — BP 118/70 | HR 64 | Ht 66.0 in | Wt 118.4 lb

## 2022-06-15 DIAGNOSIS — I5032 Chronic diastolic (congestive) heart failure: Secondary | ICD-10-CM

## 2022-06-15 DIAGNOSIS — D6869 Other thrombophilia: Secondary | ICD-10-CM

## 2022-06-15 DIAGNOSIS — I480 Paroxysmal atrial fibrillation: Secondary | ICD-10-CM | POA: Diagnosis not present

## 2022-06-15 DIAGNOSIS — I10 Essential (primary) hypertension: Secondary | ICD-10-CM

## 2022-06-15 NOTE — Patient Instructions (Signed)
Medication Instructions:  Stop taking Amlodipine *If you need a refill on your cardiac medications before your next appointment, please call your pharmacy*  Follow-Up: At [facility], you and your health needs are our priority.  As part of our continuing mission to provide you with exceptional heart care, we have created designated Provider Care Teams.  These Care Teams include your primary Cardiologist (physician) and Advanced Practice Providers (APPs -  Physician Assistants and Nurse Practitioners) who all work together to provide you with the care you need, when you need it.  We recommend signing up for the patient portal called "MyChart".  Sign up information is provided on this After Visit Summary.  MyChart is used to connect with patients for Virtual Visits (Telemedicine).  Patients are able to view lab/test results, encounter notes, upcoming appointments, etc.  Non-urgent messages can be sent to your provider as well.   To learn more about what you can do with MyChart, go to [email or web address].    Your next appointment:   1 year(s)  Provider:   Thurmon Fair, MD

## 2022-06-15 NOTE — Progress Notes (Signed)
Cardiology Office Note:    Date:  06/18/2022   ID:  [name], DOB Feb 16, 1931, MRN [id number]  PCP:  Thana Ates, MD   [facility] Providers Cardiologist:  Thurmon Fair, MD     Referring MD: Thana Ates, MD   Chief Complaint  Patient presents with   Atrial Fibrillation    History of Present Illness:    [name] is a 89 y.o. female with a hx of heart failure with mildly reduced ejection fraction (40-45% on echo 12/16/2020) with subsequent recovery of systolic function, paroxysmal atrial flutter and atrial fibrillation, first-degree AV block, HTN, COPD, history of GI bleeding returning in follow-up for her atrial arrhythmia.  On 04/19/2022 she had a fall in her bathroom after "spraying air freshener".  She had to drag himself across the house to reach a phone.  She was briefly hospitalized and treated for an E. coli UTI after her fall.  Hydrochlorothiazide was reportedly discontinued, but it appears that she is taking it again.  Loop recorder interrogation does not show any evidence of arrhythmia around the time of her fall.    She has had about a 40-minute episode of atrial fibrillation on 06/01/2022. Her overall burden of atrial fibrillation has been quite low at only 0.6%.  Although she does have RVR during episode of atrial fibrillation, the medium rates are relatively slow usually in the 100-120 range, never over 130.  Her recorder has never shown severe bradycardia or episodes of high-grade AV block.  She denies orthopnea, PND, lower extremity edema, exertional dyspnea, angina at rest or with activity.  Slight memory problems are apparent, but do not appear to be any worse than when we last met.  She lives in the independent living section at [facility].  She enjoys painting.  She is a retired Office manager.  She did not have any head impact when she fell and has not had any serious bleeding problems.  Her dose of apixaban is 2.5 mg twice daily adjusted for  advanced age and small body size.  Past Medical History:  Diagnosis Date   Anemia    Arthritis    Cancer    melanoma   Chronic diarrhea    Complication of anesthesia    hallucinations in the past, last surgery was OK anesth.    COPD (chronic obstructive pulmonary disease)    Depression    sad she cannot travel & do art like she use to.   GERD (gastroesophageal reflux disease)    Goiter    Headache(784.0)    sinus   History of blood transfusion 2006   due to colon bleeding    Hypertension    Neuromuscular disorder    peripheral neuropathy - both feet    Osteoporosis    Shortness of breath dyspnea    UTI (lower urinary tract infection)    start Tx today /w probiotic- 08/18/2014     Past Surgical History:  Procedure Laterality Date   ABDOMINAL HYSTERECTOMY     APPENDECTOMY     BALLOON DILATION  04/19/2011   Procedure: BALLOON DILATION;  Surgeon: Freddy Jaksch, MD;  Location: [facility];  Service: Endoscopy;  Laterality: N/A;   BLADDER SURGERY     COLON SURGERY  2006   Dr. Abbey Chatters- due to diverticulitis   ESOPHAGOGASTRODUODENOSCOPY  04/19/2011   Procedure: ESOPHAGOGASTRODUODENOSCOPY (EGD);  Surgeon: Freddy Jaksch, MD;  Location: [facility];  Service: Endoscopy;  Laterality: N/A;  mac  hip replacement-bilateral     JOINT REPLACEMENT     REVERSE SHOULDER ARTHROPLASTY Left 08/27/2014   Procedure: REVERSE LEFT SHOULDER ARTHROPLASTY;  Surgeon: Francena Hanly, MD;  Location: [facility] OR;  Service: Orthopedics;  Laterality: Left;   TOTAL KNEE ARTHROPLASTY Bilateral     Current Medications: Current Meds  Medication Sig   acetaminophen (TYLENOL) 650 MG CR tablet Take 650 mg by mouth every 8 (eight) hours as needed for pain.   apixaban (ELIQUIS) 2.5 MG TABS tablet Take 1 tablet (2.5 mg total) by mouth 2 (two) times daily.   carvedilol (COREG) 6.25 MG tablet Take 6.25 mg by mouth 2 (two) times daily.   clindamycin (CLEOCIN) 300 MG capsule Take 300 mg by mouth. For dental  procedure only   feeding supplement (BOOST HIGH PROTEIN) LIQD Take 1 Container by mouth daily.   fluticasone (FLONASE) 50 MCG/ACT nasal spray Place 2 sprays into both nostrils daily.   hydrochlorothiazide (HYDRODIURIL) 12.5 MG tablet Take 12.5 mg by mouth daily.   loperamide (IMODIUM) 2 MG capsule Take 1 capsule (2 mg total) by mouth 4 (four) times daily as needed for diarrhea or loose stools. (Patient taking differently: Take 4 mg by mouth 2 (two) times daily before a meal.)   loratadine (CLARITIN) 10 MG tablet Take 1 tablet (10 mg total) by mouth daily.   Multiple Vitamin (MULTIVITAMIN WITH MINERALS) TABS tablet Take 1 tablet by mouth daily.   [DISCONTINUED] amLODipine (NORVASC) 5 MG tablet Take 5 mg by mouth at bedtime.   Current Facility-Administered Medications for the 06/15/22 encounter (Office Visit) with Sunshine Mackowski, Rachelle Hora, MD  Medication   lidocaine-EPINEPHrine (XYLOCAINE W/EPI) 2 %-1:100000 (with pres) injection 10 mL     Allergies:   Amoxicillin-pot clavulanate, Cephalexin, Ciprofloxacin, Hydromorphone, Lisinopril, Mirtazapine, Nitrofuran derivatives, Septra [bactrim], and Sulfamethoxazole-trimethoprim   Social History   Socioeconomic History   Marital status: Divorced    Spouse name: Not on file   Number of children: Not on file   Years of education: Not on file   Highest education level: Not on file  Occupational History   Not on file  Tobacco Use   Smoking status: Former    Types: Cigarettes    Quit date: 02/28/1968    Years since quitting: 54.3   Smokeless tobacco: Never   Tobacco comments:    Former smoker 01/06/2021  Substance and Sexual Activity   Alcohol use: Not Currently    Comment: rarely   Drug use: No   Sexual activity: Not on file  Other Topics Concern   Not on file  Social History Narrative   Not on file   Social Determinants of Health   Financial Resource Strain: Not on file  Food Insecurity: No Food Insecurity (04/16/2022)   Hunger Vital Sign     Worried About Running Out of Food in the Last Year: Never true    Ran Out of Food in the Last Year: Never true  Transportation Needs: No Transportation Needs (04/16/2022)   PRAPARE - Administrator, Civil Service (Medical): No    Lack of Transportation (Non-Medical): No  Physical Activity: Not on file  Stress: Not on file  Social Connections: Not on file     Family History: The patient's family history includes Heart attack in her mother; Stroke in her father.  ROS:   Please see the history of present illness.     All other systems reviewed and are negative.  EKGs/Labs/Other Studies Reviewed:    The following studies  were reviewed today: Comprehensive implantable loop recorder interrogation (see above)  EKG:  EKG is ordered today.  The ekg ordered today demonstrates sinus rhythm with first-degree AV block (210 ms) left anterior fascicular block, QTc 445 ms.  Recent Labs: 04/19/2022: ALT 10; BUN 36; Creatinine, Ser 0.97; Hemoglobin 11.0; Magnesium 2.0; Platelets 202; Potassium 4.2; Sodium 138  Recent Lipid Panel    Component Value Date/Time   TRIG 48 01/16/2019 2151     Risk Assessment/Calculations:    CHA2DS2-VASc Score = 5   This indicates a 7.2% annual risk of stroke. The patient's score is based upon: CHF History: 1 HTN History: 1 Diabetes History: 0 Stroke History: 0 Vascular Disease History: 0 Age Score: 2 Gender Score: 1           Physical Exam:    VS:  BP 118/70 (BP Location: Right Arm, Patient Position: Sitting, Cuff Size: Small)   Pulse 64   Ht  (1.676 m)   Wt 118 lb 6.4 oz (53.7 kg)   SpO2 95%   BMI 19.11 kg/m     Wt Readings from Last 3 Encounters:  06/15/22 118 lb 6.4 oz (53.7 kg)  04/16/22 125 lb 14.1 oz (57.1 kg)  01/06/21 112 lb 6.4 oz (51 kg)     GEN: Very slender, but appears to be well nourished, well developed in no acute distress HEENT: Normal NECK: No JVD; No carotid bruits LYMPHATICS: No  lymphadenopathy CARDIAC: RRR, no murmurs, rubs, gallops RESPIRATORY:  Clear to auscultation without rales, wheezing or rhonchi  ABDOMEN: Soft, non-tender, non-distended MUSCULOSKELETAL:  No edema; No deformity  SKIN: Warm and dry NEUROLOGIC:  Alert and oriented x 3 PSYCHIATRIC:  Normal affect   ASSESSMENT:    1. Paroxysmal atrial fibrillation   2. Acquired thrombophilia   3. Essential hypertension   4. Chronic diastolic heart failure    PLAN:    In order of problems listed above:  AFib: The burden of arrhythmia is low and rate control is adequate, although not perfect.  I do not think she would tolerate a higher dose of beta-blocker due to relative bradycardia while in sinus rhythm, first-degree AV block and intraventricular conduction abnormality.  On appropriate anticoagulation Anticoagulation: Eliquis dose adjusted for age and small body size.  Most recent hemoglobin was normal at 12.2. HTN: Stop amlodipine, may reduce her fall risk. HFpEF: Clinically euvolemic, asymptomatic.  Presumed that she had an episode of tachycardia mediated cardiomyopathy that resolved in 2022.           Medication Adjustments/Labs and Tests Ordered: Current medicines are reviewed at length with the patient today.  Concerns regarding medicines are outlined above.  Orders Placed This Encounter  Procedures   EKG 12-Lead   No orders of the defined types were placed in this encounter.   Patient Instructions  Medication Instructions:  Stop taking Amlodipine *If you need a refill on your cardiac medications before your next appointment, please call your pharmacy*  Follow-Up: At [facility], you and your health needs are our priority.  As part of our continuing mission to provide you with exceptional heart care, we have created designated Provider Care Teams.  These Care Teams include your primary Cardiologist (physician) and Advanced Practice Providers (APPs -  Physician Assistants and  Nurse Practitioners) who all work together to provide you with the care you need, when you need it.  We recommend signing up for the patient portal called "MyChart".  Sign up information is provided on  this After Visit Summary.  MyChart is used to connect with patients for Virtual Visits (Telemedicine).  Patients are able to view lab/test results, encounter notes, upcoming appointments, etc.  Non-urgent messages can be sent to your provider as well.   To learn more about what you can do with MyChart, go to [email or web address].    Your next appointment:   1 year(s)  Provider:   Thurmon Fair, MD       Signed, Thurmon Fair, MD  06/18/2022 5:09 PM    [facility]

## 2022-06-18 ENCOUNTER — Encounter: Payer: Self-pay | Admitting: Cardiovascular Disease

## 2022-07-14 NOTE — Progress Notes (Signed)
[facility] Summary Report / Loop Recorder 

## 2022-07-17 ENCOUNTER — Ambulatory Visit (INDEPENDENT_AMBULATORY_CARE_PROVIDER_SITE_OTHER): Payer: Medicare Other

## 2022-07-17 DIAGNOSIS — I480 Paroxysmal atrial fibrillation: Secondary | ICD-10-CM

## 2022-07-17 LAB — CUP PACEART REMOTE DEVICE CHECK
Date Time Interrogation Session: 20240517230607
Implantable Pulse Generator Implant Date: 20221212

## 2022-07-27 DIAGNOSIS — M069 Rheumatoid arthritis, unspecified: Secondary | ICD-10-CM | POA: Diagnosis not present

## 2022-07-27 DIAGNOSIS — D6869 Other thrombophilia: Secondary | ICD-10-CM | POA: Diagnosis not present

## 2022-07-27 DIAGNOSIS — D329 Benign neoplasm of meninges, unspecified: Secondary | ICD-10-CM | POA: Diagnosis not present

## 2022-07-27 DIAGNOSIS — I130 Hypertensive heart and chronic kidney disease with heart failure and stage 1 through stage 4 chronic kidney disease, or unspecified chronic kidney disease: Secondary | ICD-10-CM | POA: Diagnosis not present

## 2022-07-27 DIAGNOSIS — N1832 Chronic kidney disease, stage 3b: Secondary | ICD-10-CM | POA: Diagnosis not present

## 2022-07-27 DIAGNOSIS — I5020 Unspecified systolic (congestive) heart failure: Secondary | ICD-10-CM | POA: Diagnosis not present

## 2022-07-27 DIAGNOSIS — I700 Atherosclerosis of aorta: Secondary | ICD-10-CM | POA: Diagnosis not present

## 2022-07-27 DIAGNOSIS — E46 Unspecified protein-calorie malnutrition: Secondary | ICD-10-CM | POA: Diagnosis not present

## 2022-07-27 DIAGNOSIS — G629 Polyneuropathy, unspecified: Secondary | ICD-10-CM | POA: Diagnosis not present

## 2022-08-11 NOTE — Progress Notes (Signed)
[facility] Summary Report / Loop Recorder 

## 2022-08-21 ENCOUNTER — Ambulatory Visit (INDEPENDENT_AMBULATORY_CARE_PROVIDER_SITE_OTHER): Payer: Medicare Other

## 2022-08-21 DIAGNOSIS — I480 Paroxysmal atrial fibrillation: Secondary | ICD-10-CM

## 2022-08-21 LAB — CUP PACEART REMOTE DEVICE CHECK
Date Time Interrogation Session: 20240623230955
Implantable Pulse Generator Implant Date: 20221212

## 2022-09-07 NOTE — Progress Notes (Signed)
[facility] Summary Report / Loop Recorder 

## 2022-09-13 DIAGNOSIS — M1990 Unspecified osteoarthritis, unspecified site: Secondary | ICD-10-CM | POA: Diagnosis not present

## 2022-09-13 DIAGNOSIS — R2681 Unsteadiness on feet: Secondary | ICD-10-CM | POA: Diagnosis not present

## 2022-09-13 DIAGNOSIS — R296 Repeated falls: Secondary | ICD-10-CM | POA: Diagnosis not present

## 2022-09-13 DIAGNOSIS — M62561 Muscle wasting and atrophy, not elsewhere classified, right lower leg: Secondary | ICD-10-CM | POA: Diagnosis not present

## 2022-09-13 DIAGNOSIS — M62551 Muscle wasting and atrophy, not elsewhere classified, right thigh: Secondary | ICD-10-CM | POA: Diagnosis not present

## 2022-09-13 DIAGNOSIS — M62552 Muscle wasting and atrophy, not elsewhere classified, left thigh: Secondary | ICD-10-CM | POA: Diagnosis not present

## 2022-09-13 DIAGNOSIS — M62562 Muscle wasting and atrophy, not elsewhere classified, left lower leg: Secondary | ICD-10-CM | POA: Diagnosis not present

## 2022-09-14 DIAGNOSIS — R296 Repeated falls: Secondary | ICD-10-CM | POA: Diagnosis not present

## 2022-09-14 DIAGNOSIS — M62551 Muscle wasting and atrophy, not elsewhere classified, right thigh: Secondary | ICD-10-CM | POA: Diagnosis not present

## 2022-09-14 DIAGNOSIS — M62562 Muscle wasting and atrophy, not elsewhere classified, left lower leg: Secondary | ICD-10-CM | POA: Diagnosis not present

## 2022-09-14 DIAGNOSIS — M62552 Muscle wasting and atrophy, not elsewhere classified, left thigh: Secondary | ICD-10-CM | POA: Diagnosis not present

## 2022-09-14 DIAGNOSIS — M62561 Muscle wasting and atrophy, not elsewhere classified, right lower leg: Secondary | ICD-10-CM | POA: Diagnosis not present

## 2022-09-15 DIAGNOSIS — M62551 Muscle wasting and atrophy, not elsewhere classified, right thigh: Secondary | ICD-10-CM | POA: Diagnosis not present

## 2022-09-15 DIAGNOSIS — M1990 Unspecified osteoarthritis, unspecified site: Secondary | ICD-10-CM | POA: Diagnosis not present

## 2022-09-15 DIAGNOSIS — R296 Repeated falls: Secondary | ICD-10-CM | POA: Diagnosis not present

## 2022-09-15 DIAGNOSIS — R2681 Unsteadiness on feet: Secondary | ICD-10-CM | POA: Diagnosis not present

## 2022-09-15 DIAGNOSIS — M62552 Muscle wasting and atrophy, not elsewhere classified, left thigh: Secondary | ICD-10-CM | POA: Diagnosis not present

## 2022-09-15 DIAGNOSIS — M62562 Muscle wasting and atrophy, not elsewhere classified, left lower leg: Secondary | ICD-10-CM | POA: Diagnosis not present

## 2022-09-15 DIAGNOSIS — M62561 Muscle wasting and atrophy, not elsewhere classified, right lower leg: Secondary | ICD-10-CM | POA: Diagnosis not present

## 2022-09-18 DIAGNOSIS — M62561 Muscle wasting and atrophy, not elsewhere classified, right lower leg: Secondary | ICD-10-CM | POA: Diagnosis not present

## 2022-09-18 DIAGNOSIS — R296 Repeated falls: Secondary | ICD-10-CM | POA: Diagnosis not present

## 2022-09-18 DIAGNOSIS — M62552 Muscle wasting and atrophy, not elsewhere classified, left thigh: Secondary | ICD-10-CM | POA: Diagnosis not present

## 2022-09-18 DIAGNOSIS — M62562 Muscle wasting and atrophy, not elsewhere classified, left lower leg: Secondary | ICD-10-CM | POA: Diagnosis not present

## 2022-09-18 DIAGNOSIS — M62551 Muscle wasting and atrophy, not elsewhere classified, right thigh: Secondary | ICD-10-CM | POA: Diagnosis not present

## 2022-09-19 DIAGNOSIS — M62561 Muscle wasting and atrophy, not elsewhere classified, right lower leg: Secondary | ICD-10-CM | POA: Diagnosis not present

## 2022-09-19 DIAGNOSIS — M62562 Muscle wasting and atrophy, not elsewhere classified, left lower leg: Secondary | ICD-10-CM | POA: Diagnosis not present

## 2022-09-19 DIAGNOSIS — R2681 Unsteadiness on feet: Secondary | ICD-10-CM | POA: Diagnosis not present

## 2022-09-19 DIAGNOSIS — M1990 Unspecified osteoarthritis, unspecified site: Secondary | ICD-10-CM | POA: Diagnosis not present

## 2022-09-19 DIAGNOSIS — M62551 Muscle wasting and atrophy, not elsewhere classified, right thigh: Secondary | ICD-10-CM | POA: Diagnosis not present

## 2022-09-19 DIAGNOSIS — M62552 Muscle wasting and atrophy, not elsewhere classified, left thigh: Secondary | ICD-10-CM | POA: Diagnosis not present

## 2022-09-19 DIAGNOSIS — R296 Repeated falls: Secondary | ICD-10-CM | POA: Diagnosis not present

## 2022-09-20 DIAGNOSIS — R2681 Unsteadiness on feet: Secondary | ICD-10-CM | POA: Diagnosis not present

## 2022-09-20 DIAGNOSIS — R296 Repeated falls: Secondary | ICD-10-CM | POA: Diagnosis not present

## 2022-09-20 DIAGNOSIS — M1990 Unspecified osteoarthritis, unspecified site: Secondary | ICD-10-CM | POA: Diagnosis not present

## 2022-09-22 DIAGNOSIS — M62551 Muscle wasting and atrophy, not elsewhere classified, right thigh: Secondary | ICD-10-CM | POA: Diagnosis not present

## 2022-09-22 DIAGNOSIS — M62561 Muscle wasting and atrophy, not elsewhere classified, right lower leg: Secondary | ICD-10-CM | POA: Diagnosis not present

## 2022-09-22 DIAGNOSIS — R296 Repeated falls: Secondary | ICD-10-CM | POA: Diagnosis not present

## 2022-09-22 DIAGNOSIS — M62552 Muscle wasting and atrophy, not elsewhere classified, left thigh: Secondary | ICD-10-CM | POA: Diagnosis not present

## 2022-09-22 DIAGNOSIS — M62562 Muscle wasting and atrophy, not elsewhere classified, left lower leg: Secondary | ICD-10-CM | POA: Diagnosis not present

## 2022-09-25 ENCOUNTER — Ambulatory Visit (INDEPENDENT_AMBULATORY_CARE_PROVIDER_SITE_OTHER): Payer: Medicare Other

## 2022-09-25 DIAGNOSIS — M62561 Muscle wasting and atrophy, not elsewhere classified, right lower leg: Secondary | ICD-10-CM | POA: Diagnosis not present

## 2022-09-25 DIAGNOSIS — I480 Paroxysmal atrial fibrillation: Secondary | ICD-10-CM | POA: Diagnosis not present

## 2022-09-25 DIAGNOSIS — M62562 Muscle wasting and atrophy, not elsewhere classified, left lower leg: Secondary | ICD-10-CM | POA: Diagnosis not present

## 2022-09-25 DIAGNOSIS — R296 Repeated falls: Secondary | ICD-10-CM | POA: Diagnosis not present

## 2022-09-25 DIAGNOSIS — M62551 Muscle wasting and atrophy, not elsewhere classified, right thigh: Secondary | ICD-10-CM | POA: Diagnosis not present

## 2022-09-25 DIAGNOSIS — M62552 Muscle wasting and atrophy, not elsewhere classified, left thigh: Secondary | ICD-10-CM | POA: Diagnosis not present

## 2022-09-26 DIAGNOSIS — M62551 Muscle wasting and atrophy, not elsewhere classified, right thigh: Secondary | ICD-10-CM | POA: Diagnosis not present

## 2022-09-26 DIAGNOSIS — M62561 Muscle wasting and atrophy, not elsewhere classified, right lower leg: Secondary | ICD-10-CM | POA: Diagnosis not present

## 2022-09-26 DIAGNOSIS — R296 Repeated falls: Secondary | ICD-10-CM | POA: Diagnosis not present

## 2022-09-26 DIAGNOSIS — M62562 Muscle wasting and atrophy, not elsewhere classified, left lower leg: Secondary | ICD-10-CM | POA: Diagnosis not present

## 2022-09-26 DIAGNOSIS — M62552 Muscle wasting and atrophy, not elsewhere classified, left thigh: Secondary | ICD-10-CM | POA: Diagnosis not present

## 2022-09-26 DIAGNOSIS — M1990 Unspecified osteoarthritis, unspecified site: Secondary | ICD-10-CM | POA: Diagnosis not present

## 2022-09-26 DIAGNOSIS — R2681 Unsteadiness on feet: Secondary | ICD-10-CM | POA: Diagnosis not present

## 2022-09-29 DIAGNOSIS — R2681 Unsteadiness on feet: Secondary | ICD-10-CM | POA: Diagnosis not present

## 2022-09-29 DIAGNOSIS — M1990 Unspecified osteoarthritis, unspecified site: Secondary | ICD-10-CM | POA: Diagnosis not present

## 2022-09-29 DIAGNOSIS — R296 Repeated falls: Secondary | ICD-10-CM | POA: Diagnosis not present

## 2022-10-02 DIAGNOSIS — R2681 Unsteadiness on feet: Secondary | ICD-10-CM | POA: Diagnosis not present

## 2022-10-02 DIAGNOSIS — R296 Repeated falls: Secondary | ICD-10-CM | POA: Diagnosis not present

## 2022-10-02 DIAGNOSIS — M1990 Unspecified osteoarthritis, unspecified site: Secondary | ICD-10-CM | POA: Diagnosis not present

## 2022-10-03 DIAGNOSIS — M62561 Muscle wasting and atrophy, not elsewhere classified, right lower leg: Secondary | ICD-10-CM | POA: Diagnosis not present

## 2022-10-03 DIAGNOSIS — M62551 Muscle wasting and atrophy, not elsewhere classified, right thigh: Secondary | ICD-10-CM | POA: Diagnosis not present

## 2022-10-03 DIAGNOSIS — M62552 Muscle wasting and atrophy, not elsewhere classified, left thigh: Secondary | ICD-10-CM | POA: Diagnosis not present

## 2022-10-03 DIAGNOSIS — M62562 Muscle wasting and atrophy, not elsewhere classified, left lower leg: Secondary | ICD-10-CM | POA: Diagnosis not present

## 2022-10-03 DIAGNOSIS — R296 Repeated falls: Secondary | ICD-10-CM | POA: Diagnosis not present

## 2022-10-04 DIAGNOSIS — M62552 Muscle wasting and atrophy, not elsewhere classified, left thigh: Secondary | ICD-10-CM | POA: Diagnosis not present

## 2022-10-04 DIAGNOSIS — M62562 Muscle wasting and atrophy, not elsewhere classified, left lower leg: Secondary | ICD-10-CM | POA: Diagnosis not present

## 2022-10-04 DIAGNOSIS — M62561 Muscle wasting and atrophy, not elsewhere classified, right lower leg: Secondary | ICD-10-CM | POA: Diagnosis not present

## 2022-10-04 DIAGNOSIS — R296 Repeated falls: Secondary | ICD-10-CM | POA: Diagnosis not present

## 2022-10-04 DIAGNOSIS — M62551 Muscle wasting and atrophy, not elsewhere classified, right thigh: Secondary | ICD-10-CM | POA: Diagnosis not present

## 2022-10-05 DIAGNOSIS — R2681 Unsteadiness on feet: Secondary | ICD-10-CM | POA: Diagnosis not present

## 2022-10-05 DIAGNOSIS — M1990 Unspecified osteoarthritis, unspecified site: Secondary | ICD-10-CM | POA: Diagnosis not present

## 2022-10-05 DIAGNOSIS — R296 Repeated falls: Secondary | ICD-10-CM | POA: Diagnosis not present

## 2022-10-09 DIAGNOSIS — R296 Repeated falls: Secondary | ICD-10-CM | POA: Diagnosis not present

## 2022-10-09 DIAGNOSIS — M62552 Muscle wasting and atrophy, not elsewhere classified, left thigh: Secondary | ICD-10-CM | POA: Diagnosis not present

## 2022-10-09 DIAGNOSIS — M62551 Muscle wasting and atrophy, not elsewhere classified, right thigh: Secondary | ICD-10-CM | POA: Diagnosis not present

## 2022-10-09 DIAGNOSIS — M62562 Muscle wasting and atrophy, not elsewhere classified, left lower leg: Secondary | ICD-10-CM | POA: Diagnosis not present

## 2022-10-09 DIAGNOSIS — M62561 Muscle wasting and atrophy, not elsewhere classified, right lower leg: Secondary | ICD-10-CM | POA: Diagnosis not present

## 2022-10-10 DIAGNOSIS — R296 Repeated falls: Secondary | ICD-10-CM | POA: Diagnosis not present

## 2022-10-10 DIAGNOSIS — M1990 Unspecified osteoarthritis, unspecified site: Secondary | ICD-10-CM | POA: Diagnosis not present

## 2022-10-10 DIAGNOSIS — R2681 Unsteadiness on feet: Secondary | ICD-10-CM | POA: Diagnosis not present

## 2022-10-10 NOTE — Progress Notes (Signed)
[facility] Summary Report / Loop Recorder 

## 2022-10-11 DIAGNOSIS — M62552 Muscle wasting and atrophy, not elsewhere classified, left thigh: Secondary | ICD-10-CM | POA: Diagnosis not present

## 2022-10-11 DIAGNOSIS — R296 Repeated falls: Secondary | ICD-10-CM | POA: Diagnosis not present

## 2022-10-11 DIAGNOSIS — R2681 Unsteadiness on feet: Secondary | ICD-10-CM | POA: Diagnosis not present

## 2022-10-11 DIAGNOSIS — M1990 Unspecified osteoarthritis, unspecified site: Secondary | ICD-10-CM | POA: Diagnosis not present

## 2022-10-11 DIAGNOSIS — M62551 Muscle wasting and atrophy, not elsewhere classified, right thigh: Secondary | ICD-10-CM | POA: Diagnosis not present

## 2022-10-11 DIAGNOSIS — M62561 Muscle wasting and atrophy, not elsewhere classified, right lower leg: Secondary | ICD-10-CM | POA: Diagnosis not present

## 2022-10-11 DIAGNOSIS — M62562 Muscle wasting and atrophy, not elsewhere classified, left lower leg: Secondary | ICD-10-CM | POA: Diagnosis not present

## 2022-10-16 DIAGNOSIS — R296 Repeated falls: Secondary | ICD-10-CM | POA: Diagnosis not present

## 2022-10-16 DIAGNOSIS — M62562 Muscle wasting and atrophy, not elsewhere classified, left lower leg: Secondary | ICD-10-CM | POA: Diagnosis not present

## 2022-10-16 DIAGNOSIS — M62561 Muscle wasting and atrophy, not elsewhere classified, right lower leg: Secondary | ICD-10-CM | POA: Diagnosis not present

## 2022-10-16 DIAGNOSIS — M62552 Muscle wasting and atrophy, not elsewhere classified, left thigh: Secondary | ICD-10-CM | POA: Diagnosis not present

## 2022-10-16 DIAGNOSIS — M62551 Muscle wasting and atrophy, not elsewhere classified, right thigh: Secondary | ICD-10-CM | POA: Diagnosis not present

## 2022-10-17 DIAGNOSIS — R2681 Unsteadiness on feet: Secondary | ICD-10-CM | POA: Diagnosis not present

## 2022-10-17 DIAGNOSIS — M1990 Unspecified osteoarthritis, unspecified site: Secondary | ICD-10-CM | POA: Diagnosis not present

## 2022-10-17 DIAGNOSIS — R296 Repeated falls: Secondary | ICD-10-CM | POA: Diagnosis not present

## 2022-10-19 DIAGNOSIS — M1990 Unspecified osteoarthritis, unspecified site: Secondary | ICD-10-CM | POA: Diagnosis not present

## 2022-10-19 DIAGNOSIS — R2681 Unsteadiness on feet: Secondary | ICD-10-CM | POA: Diagnosis not present

## 2022-10-19 DIAGNOSIS — R296 Repeated falls: Secondary | ICD-10-CM | POA: Diagnosis not present

## 2022-10-23 DIAGNOSIS — M62551 Muscle wasting and atrophy, not elsewhere classified, right thigh: Secondary | ICD-10-CM | POA: Diagnosis not present

## 2022-10-23 DIAGNOSIS — M62561 Muscle wasting and atrophy, not elsewhere classified, right lower leg: Secondary | ICD-10-CM | POA: Diagnosis not present

## 2022-10-23 DIAGNOSIS — R296 Repeated falls: Secondary | ICD-10-CM | POA: Diagnosis not present

## 2022-10-23 DIAGNOSIS — M62552 Muscle wasting and atrophy, not elsewhere classified, left thigh: Secondary | ICD-10-CM | POA: Diagnosis not present

## 2022-10-23 DIAGNOSIS — M62562 Muscle wasting and atrophy, not elsewhere classified, left lower leg: Secondary | ICD-10-CM | POA: Diagnosis not present

## 2022-10-24 DIAGNOSIS — R2681 Unsteadiness on feet: Secondary | ICD-10-CM | POA: Diagnosis not present

## 2022-10-24 DIAGNOSIS — R296 Repeated falls: Secondary | ICD-10-CM | POA: Diagnosis not present

## 2022-10-24 DIAGNOSIS — M1990 Unspecified osteoarthritis, unspecified site: Secondary | ICD-10-CM | POA: Diagnosis not present

## 2022-10-25 DIAGNOSIS — M62562 Muscle wasting and atrophy, not elsewhere classified, left lower leg: Secondary | ICD-10-CM | POA: Diagnosis not present

## 2022-10-25 DIAGNOSIS — M62551 Muscle wasting and atrophy, not elsewhere classified, right thigh: Secondary | ICD-10-CM | POA: Diagnosis not present

## 2022-10-25 DIAGNOSIS — R296 Repeated falls: Secondary | ICD-10-CM | POA: Diagnosis not present

## 2022-10-25 DIAGNOSIS — M62561 Muscle wasting and atrophy, not elsewhere classified, right lower leg: Secondary | ICD-10-CM | POA: Diagnosis not present

## 2022-10-25 DIAGNOSIS — M62552 Muscle wasting and atrophy, not elsewhere classified, left thigh: Secondary | ICD-10-CM | POA: Diagnosis not present

## 2022-10-26 DIAGNOSIS — R296 Repeated falls: Secondary | ICD-10-CM | POA: Diagnosis not present

## 2022-10-26 DIAGNOSIS — R2681 Unsteadiness on feet: Secondary | ICD-10-CM | POA: Diagnosis not present

## 2022-10-26 DIAGNOSIS — M1990 Unspecified osteoarthritis, unspecified site: Secondary | ICD-10-CM | POA: Diagnosis not present

## 2022-10-26 LAB — CUP PACEART REMOTE DEVICE CHECK
Date Time Interrogation Session: 20240828230828
Implantable Pulse Generator Implant Date: 20221212

## 2022-10-31 ENCOUNTER — Ambulatory Visit (INDEPENDENT_AMBULATORY_CARE_PROVIDER_SITE_OTHER): Payer: Medicare Other

## 2022-10-31 DIAGNOSIS — I480 Paroxysmal atrial fibrillation: Secondary | ICD-10-CM | POA: Diagnosis not present

## 2022-11-02 DIAGNOSIS — I480 Paroxysmal atrial fibrillation: Secondary | ICD-10-CM | POA: Diagnosis not present

## 2022-11-02 DIAGNOSIS — D329 Benign neoplasm of meninges, unspecified: Secondary | ICD-10-CM | POA: Diagnosis not present

## 2022-11-02 DIAGNOSIS — N1832 Chronic kidney disease, stage 3b: Secondary | ICD-10-CM | POA: Diagnosis not present

## 2022-11-02 DIAGNOSIS — E46 Unspecified protein-calorie malnutrition: Secondary | ICD-10-CM | POA: Diagnosis not present

## 2022-11-02 DIAGNOSIS — K529 Noninfective gastroenteritis and colitis, unspecified: Secondary | ICD-10-CM | POA: Diagnosis not present

## 2022-11-02 DIAGNOSIS — G629 Polyneuropathy, unspecified: Secondary | ICD-10-CM | POA: Diagnosis not present

## 2022-11-02 DIAGNOSIS — D6869 Other thrombophilia: Secondary | ICD-10-CM | POA: Diagnosis not present

## 2022-11-02 DIAGNOSIS — I5020 Unspecified systolic (congestive) heart failure: Secondary | ICD-10-CM | POA: Diagnosis not present

## 2022-11-02 DIAGNOSIS — Z0000 Encounter for general adult medical examination without abnormal findings: Secondary | ICD-10-CM | POA: Diagnosis not present

## 2022-11-02 DIAGNOSIS — I700 Atherosclerosis of aorta: Secondary | ICD-10-CM | POA: Diagnosis not present

## 2022-11-02 DIAGNOSIS — M069 Rheumatoid arthritis, unspecified: Secondary | ICD-10-CM | POA: Diagnosis not present

## 2022-11-07 NOTE — Progress Notes (Signed)
[facility] Summary Report / Loop Recorder 

## 2022-11-15 DIAGNOSIS — M62561 Muscle wasting and atrophy, not elsewhere classified, right lower leg: Secondary | ICD-10-CM | POA: Diagnosis not present

## 2022-11-15 DIAGNOSIS — R296 Repeated falls: Secondary | ICD-10-CM | POA: Diagnosis not present

## 2022-11-15 DIAGNOSIS — M62562 Muscle wasting and atrophy, not elsewhere classified, left lower leg: Secondary | ICD-10-CM | POA: Diagnosis not present

## 2022-11-15 DIAGNOSIS — M62552 Muscle wasting and atrophy, not elsewhere classified, left thigh: Secondary | ICD-10-CM | POA: Diagnosis not present

## 2022-11-15 DIAGNOSIS — M62551 Muscle wasting and atrophy, not elsewhere classified, right thigh: Secondary | ICD-10-CM | POA: Diagnosis not present

## 2022-11-16 DIAGNOSIS — R2681 Unsteadiness on feet: Secondary | ICD-10-CM | POA: Diagnosis not present

## 2022-11-16 DIAGNOSIS — M1990 Unspecified osteoarthritis, unspecified site: Secondary | ICD-10-CM | POA: Diagnosis not present

## 2022-11-16 DIAGNOSIS — R296 Repeated falls: Secondary | ICD-10-CM | POA: Diagnosis not present

## 2022-11-20 DIAGNOSIS — M62561 Muscle wasting and atrophy, not elsewhere classified, right lower leg: Secondary | ICD-10-CM | POA: Diagnosis not present

## 2022-11-20 DIAGNOSIS — M62552 Muscle wasting and atrophy, not elsewhere classified, left thigh: Secondary | ICD-10-CM | POA: Diagnosis not present

## 2022-11-20 DIAGNOSIS — R296 Repeated falls: Secondary | ICD-10-CM | POA: Diagnosis not present

## 2022-11-20 DIAGNOSIS — M62551 Muscle wasting and atrophy, not elsewhere classified, right thigh: Secondary | ICD-10-CM | POA: Diagnosis not present

## 2022-11-20 DIAGNOSIS — M62562 Muscle wasting and atrophy, not elsewhere classified, left lower leg: Secondary | ICD-10-CM | POA: Diagnosis not present

## 2022-11-21 DIAGNOSIS — M1990 Unspecified osteoarthritis, unspecified site: Secondary | ICD-10-CM | POA: Diagnosis not present

## 2022-11-21 DIAGNOSIS — R2681 Unsteadiness on feet: Secondary | ICD-10-CM | POA: Diagnosis not present

## 2022-11-21 DIAGNOSIS — R296 Repeated falls: Secondary | ICD-10-CM | POA: Diagnosis not present

## 2022-11-22 DIAGNOSIS — M62562 Muscle wasting and atrophy, not elsewhere classified, left lower leg: Secondary | ICD-10-CM | POA: Diagnosis not present

## 2022-11-22 DIAGNOSIS — M62561 Muscle wasting and atrophy, not elsewhere classified, right lower leg: Secondary | ICD-10-CM | POA: Diagnosis not present

## 2022-11-22 DIAGNOSIS — M62551 Muscle wasting and atrophy, not elsewhere classified, right thigh: Secondary | ICD-10-CM | POA: Diagnosis not present

## 2022-11-22 DIAGNOSIS — M62552 Muscle wasting and atrophy, not elsewhere classified, left thigh: Secondary | ICD-10-CM | POA: Diagnosis not present

## 2022-11-22 DIAGNOSIS — R296 Repeated falls: Secondary | ICD-10-CM | POA: Diagnosis not present

## 2022-11-23 DIAGNOSIS — M1990 Unspecified osteoarthritis, unspecified site: Secondary | ICD-10-CM | POA: Diagnosis not present

## 2022-11-23 DIAGNOSIS — R296 Repeated falls: Secondary | ICD-10-CM | POA: Diagnosis not present

## 2022-11-23 DIAGNOSIS — R2681 Unsteadiness on feet: Secondary | ICD-10-CM | POA: Diagnosis not present

## 2022-11-27 DIAGNOSIS — M62552 Muscle wasting and atrophy, not elsewhere classified, left thigh: Secondary | ICD-10-CM | POA: Diagnosis not present

## 2022-11-27 DIAGNOSIS — R296 Repeated falls: Secondary | ICD-10-CM | POA: Diagnosis not present

## 2022-11-27 DIAGNOSIS — M62562 Muscle wasting and atrophy, not elsewhere classified, left lower leg: Secondary | ICD-10-CM | POA: Diagnosis not present

## 2022-11-27 DIAGNOSIS — M62561 Muscle wasting and atrophy, not elsewhere classified, right lower leg: Secondary | ICD-10-CM | POA: Diagnosis not present

## 2022-11-27 DIAGNOSIS — M62551 Muscle wasting and atrophy, not elsewhere classified, right thigh: Secondary | ICD-10-CM | POA: Diagnosis not present

## 2022-11-28 LAB — CUP PACEART REMOTE DEVICE CHECK
Date Time Interrogation Session: 20240930231744
Implantable Pulse Generator Implant Date: 20221212

## 2022-12-04 ENCOUNTER — Ambulatory Visit (INDEPENDENT_AMBULATORY_CARE_PROVIDER_SITE_OTHER): Payer: Medicare Other

## 2022-12-04 DIAGNOSIS — I480 Paroxysmal atrial fibrillation: Secondary | ICD-10-CM

## 2022-12-05 DIAGNOSIS — M62561 Muscle wasting and atrophy, not elsewhere classified, right lower leg: Secondary | ICD-10-CM | POA: Diagnosis not present

## 2022-12-05 DIAGNOSIS — M62551 Muscle wasting and atrophy, not elsewhere classified, right thigh: Secondary | ICD-10-CM | POA: Diagnosis not present

## 2022-12-05 DIAGNOSIS — M62552 Muscle wasting and atrophy, not elsewhere classified, left thigh: Secondary | ICD-10-CM | POA: Diagnosis not present

## 2022-12-05 DIAGNOSIS — M1990 Unspecified osteoarthritis, unspecified site: Secondary | ICD-10-CM | POA: Diagnosis not present

## 2022-12-05 DIAGNOSIS — R296 Repeated falls: Secondary | ICD-10-CM | POA: Diagnosis not present

## 2022-12-05 DIAGNOSIS — M62562 Muscle wasting and atrophy, not elsewhere classified, left lower leg: Secondary | ICD-10-CM | POA: Diagnosis not present

## 2022-12-05 DIAGNOSIS — R2681 Unsteadiness on feet: Secondary | ICD-10-CM | POA: Diagnosis not present

## 2022-12-06 DIAGNOSIS — R296 Repeated falls: Secondary | ICD-10-CM | POA: Diagnosis not present

## 2022-12-06 DIAGNOSIS — R2681 Unsteadiness on feet: Secondary | ICD-10-CM | POA: Diagnosis not present

## 2022-12-06 DIAGNOSIS — M1990 Unspecified osteoarthritis, unspecified site: Secondary | ICD-10-CM | POA: Diagnosis not present

## 2022-12-07 DIAGNOSIS — M62562 Muscle wasting and atrophy, not elsewhere classified, left lower leg: Secondary | ICD-10-CM | POA: Diagnosis not present

## 2022-12-07 DIAGNOSIS — M62561 Muscle wasting and atrophy, not elsewhere classified, right lower leg: Secondary | ICD-10-CM | POA: Diagnosis not present

## 2022-12-07 DIAGNOSIS — R296 Repeated falls: Secondary | ICD-10-CM | POA: Diagnosis not present

## 2022-12-07 DIAGNOSIS — M62552 Muscle wasting and atrophy, not elsewhere classified, left thigh: Secondary | ICD-10-CM | POA: Diagnosis not present

## 2022-12-07 DIAGNOSIS — M62551 Muscle wasting and atrophy, not elsewhere classified, right thigh: Secondary | ICD-10-CM | POA: Diagnosis not present

## 2022-12-11 DIAGNOSIS — M62552 Muscle wasting and atrophy, not elsewhere classified, left thigh: Secondary | ICD-10-CM | POA: Diagnosis not present

## 2022-12-11 DIAGNOSIS — M62551 Muscle wasting and atrophy, not elsewhere classified, right thigh: Secondary | ICD-10-CM | POA: Diagnosis not present

## 2022-12-11 DIAGNOSIS — M62561 Muscle wasting and atrophy, not elsewhere classified, right lower leg: Secondary | ICD-10-CM | POA: Diagnosis not present

## 2022-12-11 DIAGNOSIS — R296 Repeated falls: Secondary | ICD-10-CM | POA: Diagnosis not present

## 2022-12-11 DIAGNOSIS — M1990 Unspecified osteoarthritis, unspecified site: Secondary | ICD-10-CM | POA: Diagnosis not present

## 2022-12-11 DIAGNOSIS — M62562 Muscle wasting and atrophy, not elsewhere classified, left lower leg: Secondary | ICD-10-CM | POA: Diagnosis not present

## 2022-12-11 DIAGNOSIS — R2681 Unsteadiness on feet: Secondary | ICD-10-CM | POA: Diagnosis not present

## 2022-12-12 DIAGNOSIS — R296 Repeated falls: Secondary | ICD-10-CM | POA: Diagnosis not present

## 2022-12-12 DIAGNOSIS — M1990 Unspecified osteoarthritis, unspecified site: Secondary | ICD-10-CM | POA: Diagnosis not present

## 2022-12-12 DIAGNOSIS — R2681 Unsteadiness on feet: Secondary | ICD-10-CM | POA: Diagnosis not present

## 2022-12-13 DIAGNOSIS — R2681 Unsteadiness on feet: Secondary | ICD-10-CM | POA: Diagnosis not present

## 2022-12-13 DIAGNOSIS — M1990 Unspecified osteoarthritis, unspecified site: Secondary | ICD-10-CM | POA: Diagnosis not present

## 2022-12-13 DIAGNOSIS — R296 Repeated falls: Secondary | ICD-10-CM | POA: Diagnosis not present

## 2022-12-15 NOTE — Progress Notes (Unsigned)
Carelink Summary Report / Loop Recorder 

## 2022-12-25 DIAGNOSIS — M62562 Muscle wasting and atrophy, not elsewhere classified, left lower leg: Secondary | ICD-10-CM | POA: Diagnosis not present

## 2022-12-25 DIAGNOSIS — M62552 Muscle wasting and atrophy, not elsewhere classified, left thigh: Secondary | ICD-10-CM | POA: Diagnosis not present

## 2022-12-25 DIAGNOSIS — M62551 Muscle wasting and atrophy, not elsewhere classified, right thigh: Secondary | ICD-10-CM | POA: Diagnosis not present

## 2022-12-25 DIAGNOSIS — M62561 Muscle wasting and atrophy, not elsewhere classified, right lower leg: Secondary | ICD-10-CM | POA: Diagnosis not present

## 2022-12-25 DIAGNOSIS — R296 Repeated falls: Secondary | ICD-10-CM | POA: Diagnosis not present

## 2022-12-26 DIAGNOSIS — M1990 Unspecified osteoarthritis, unspecified site: Secondary | ICD-10-CM | POA: Diagnosis not present

## 2022-12-26 DIAGNOSIS — R2681 Unsteadiness on feet: Secondary | ICD-10-CM | POA: Diagnosis not present

## 2022-12-26 DIAGNOSIS — R296 Repeated falls: Secondary | ICD-10-CM | POA: Diagnosis not present

## 2022-12-27 DIAGNOSIS — M62552 Muscle wasting and atrophy, not elsewhere classified, left thigh: Secondary | ICD-10-CM | POA: Diagnosis not present

## 2022-12-27 DIAGNOSIS — R296 Repeated falls: Secondary | ICD-10-CM | POA: Diagnosis not present

## 2022-12-27 DIAGNOSIS — M62562 Muscle wasting and atrophy, not elsewhere classified, left lower leg: Secondary | ICD-10-CM | POA: Diagnosis not present

## 2022-12-27 DIAGNOSIS — M62561 Muscle wasting and atrophy, not elsewhere classified, right lower leg: Secondary | ICD-10-CM | POA: Diagnosis not present

## 2022-12-27 DIAGNOSIS — M62551 Muscle wasting and atrophy, not elsewhere classified, right thigh: Secondary | ICD-10-CM | POA: Diagnosis not present

## 2022-12-28 DIAGNOSIS — M1990 Unspecified osteoarthritis, unspecified site: Secondary | ICD-10-CM | POA: Diagnosis not present

## 2022-12-28 DIAGNOSIS — R296 Repeated falls: Secondary | ICD-10-CM | POA: Diagnosis not present

## 2022-12-28 DIAGNOSIS — R2681 Unsteadiness on feet: Secondary | ICD-10-CM | POA: Diagnosis not present

## 2023-01-01 DIAGNOSIS — M62551 Muscle wasting and atrophy, not elsewhere classified, right thigh: Secondary | ICD-10-CM | POA: Diagnosis not present

## 2023-01-01 DIAGNOSIS — M62552 Muscle wasting and atrophy, not elsewhere classified, left thigh: Secondary | ICD-10-CM | POA: Diagnosis not present

## 2023-01-01 DIAGNOSIS — M62561 Muscle wasting and atrophy, not elsewhere classified, right lower leg: Secondary | ICD-10-CM | POA: Diagnosis not present

## 2023-01-01 DIAGNOSIS — R296 Repeated falls: Secondary | ICD-10-CM | POA: Diagnosis not present

## 2023-01-01 DIAGNOSIS — M62562 Muscle wasting and atrophy, not elsewhere classified, left lower leg: Secondary | ICD-10-CM | POA: Diagnosis not present

## 2023-01-01 LAB — CUP PACEART REMOTE DEVICE CHECK
Date Time Interrogation Session: 20241102230428
Implantable Pulse Generator Implant Date: 20221212

## 2023-01-02 DIAGNOSIS — R2681 Unsteadiness on feet: Secondary | ICD-10-CM | POA: Diagnosis not present

## 2023-01-02 DIAGNOSIS — R296 Repeated falls: Secondary | ICD-10-CM | POA: Diagnosis not present

## 2023-01-02 DIAGNOSIS — M1990 Unspecified osteoarthritis, unspecified site: Secondary | ICD-10-CM | POA: Diagnosis not present

## 2023-01-03 DIAGNOSIS — M62551 Muscle wasting and atrophy, not elsewhere classified, right thigh: Secondary | ICD-10-CM | POA: Diagnosis not present

## 2023-01-03 DIAGNOSIS — M62561 Muscle wasting and atrophy, not elsewhere classified, right lower leg: Secondary | ICD-10-CM | POA: Diagnosis not present

## 2023-01-03 DIAGNOSIS — R296 Repeated falls: Secondary | ICD-10-CM | POA: Diagnosis not present

## 2023-01-03 DIAGNOSIS — M62552 Muscle wasting and atrophy, not elsewhere classified, left thigh: Secondary | ICD-10-CM | POA: Diagnosis not present

## 2023-01-03 DIAGNOSIS — M62562 Muscle wasting and atrophy, not elsewhere classified, left lower leg: Secondary | ICD-10-CM | POA: Diagnosis not present

## 2023-01-04 DIAGNOSIS — R296 Repeated falls: Secondary | ICD-10-CM | POA: Diagnosis not present

## 2023-01-04 DIAGNOSIS — M1990 Unspecified osteoarthritis, unspecified site: Secondary | ICD-10-CM | POA: Diagnosis not present

## 2023-01-04 DIAGNOSIS — R2681 Unsteadiness on feet: Secondary | ICD-10-CM | POA: Diagnosis not present

## 2023-01-08 ENCOUNTER — Ambulatory Visit (INDEPENDENT_AMBULATORY_CARE_PROVIDER_SITE_OTHER): Payer: Medicare Other

## 2023-01-08 DIAGNOSIS — I480 Paroxysmal atrial fibrillation: Secondary | ICD-10-CM

## 2023-01-18 DIAGNOSIS — R2681 Unsteadiness on feet: Secondary | ICD-10-CM | POA: Diagnosis not present

## 2023-01-18 DIAGNOSIS — M1990 Unspecified osteoarthritis, unspecified site: Secondary | ICD-10-CM | POA: Diagnosis not present

## 2023-01-18 DIAGNOSIS — R296 Repeated falls: Secondary | ICD-10-CM | POA: Diagnosis not present

## 2023-01-31 NOTE — Progress Notes (Signed)
[facility] Summary Report / Loop Recorder 

## 2023-02-02 ENCOUNTER — Ambulatory Visit (INDEPENDENT_AMBULATORY_CARE_PROVIDER_SITE_OTHER): Payer: Medicare Other

## 2023-02-02 DIAGNOSIS — I480 Paroxysmal atrial fibrillation: Secondary | ICD-10-CM | POA: Diagnosis not present

## 2023-02-02 LAB — CUP PACEART REMOTE DEVICE CHECK
Date Time Interrogation Session: 20241205230607
Implantable Pulse Generator Implant Date: 20221212

## 2023-03-09 ENCOUNTER — Ambulatory Visit (INDEPENDENT_AMBULATORY_CARE_PROVIDER_SITE_OTHER): Payer: No Typology Code available for payment source

## 2023-03-09 DIAGNOSIS — I480 Paroxysmal atrial fibrillation: Secondary | ICD-10-CM

## 2023-03-09 LAB — CUP PACEART REMOTE DEVICE CHECK
Date Time Interrogation Session: 20250109230705
Implantable Pulse Generator Implant Date: 20221212

## 2023-03-12 DIAGNOSIS — R351 Nocturia: Secondary | ICD-10-CM | POA: Diagnosis not present

## 2023-03-12 DIAGNOSIS — N3946 Mixed incontinence: Secondary | ICD-10-CM | POA: Diagnosis not present

## 2023-03-13 ENCOUNTER — Telehealth: Payer: Self-pay | Admitting: Cardiovascular Disease

## 2023-03-13 MED ORDER — APIXABAN 2.5 MG PO TABS: 2.5000 mg | ORAL_TABLET | Freq: Two times a day (BID) | ORAL | Status: AC

## 2023-03-13 NOTE — Telephone Encounter (Signed)
 Pt c/o medication issue:  1. Name of Medication:   Take 650 mg by mouth every 8 (eight) hours as needed for pain.    apixaban  (ELIQUIS ) 2.5 MG TABS tablet    2. How are you currently taking this medication (dosage and times per day)? As written  3. Are you having a reaction (difficulty breathing--STAT)? No   4. What is your medication issue? Pt states medication is over $500 and she cannot afford it

## 2023-03-13 NOTE — Telephone Encounter (Signed)
Patient is calling to follow up

## 2023-03-13 NOTE — Telephone Encounter (Signed)
 Spoke with pt, she reports that she only has 1 elquis left for today. She reports the medical doctor has some samples for her and she wants to apply for patient assistance. Patient aware she will need to be denied by medicare LIS before applying for assistance. The number to the senior resources department. Samples placed at the front desk for patient pick up.

## 2023-03-21 ENCOUNTER — Telehealth: Payer: Self-pay

## 2023-03-21 NOTE — Telephone Encounter (Signed)
Alert received from [employer] for alert for average V rates during AT/AF > threshold. Presenting possible 2:1 AFL AF/AFL events with RVR, burden 0.2%, HR's 133-207.  Hx of PAF, Eliquis per EPIC Route to triage for RVR.  Attempted to contact patient/emergency contacts to assess pt symptoms. Unable to send mychart message d/t not set up.

## 2023-03-22 NOTE — Telephone Encounter (Signed)
Spoke to patients friend [name] on emergency contact list who advised patient has "a cold", not feeling well including unable to eat well or drink much. States patient has not taken her medications the past day or so d/t not feeling well. States she did get her ensure and patient drank that ok. Carelink checked today and patient is back in SR with controlled rates. See EGM below. Friend states she is going to call patient soon to check on her. If patient is not better states she will advise patient to be seen by a doctor. Friend states [employer] Valentino Saxon) is coming out to check out patient today and check on pt daily for next few days. Stressed medication compliance.  States she will update patient and call back if pt has further questions or concerns. ED precautions advised if patient has worsening symptoms.   Direct number given to [name] and asked if she could have patient call device clinic.

## 2023-03-23 ENCOUNTER — Emergency Department (HOSPITAL_COMMUNITY)
Admission: EM | Admit: 2023-03-23 | Discharge: 2023-03-26 | Disposition: A | Discharge status: Skilled Nursing Facility | Payer: No Typology Code available for payment source | Attending: Emergency Medicine | Admitting: Emergency Medicine

## 2023-03-23 ENCOUNTER — Encounter (HOSPITAL_COMMUNITY): Payer: Self-pay

## 2023-03-23 ENCOUNTER — Emergency Department (HOSPITAL_COMMUNITY): Payer: No Typology Code available for payment source

## 2023-03-23 ENCOUNTER — Other Ambulatory Visit: Payer: Self-pay

## 2023-03-23 DIAGNOSIS — R059 Cough, unspecified: Secondary | ICD-10-CM | POA: Diagnosis present

## 2023-03-23 DIAGNOSIS — M6281 Muscle weakness (generalized): Secondary | ICD-10-CM | POA: Diagnosis not present

## 2023-03-23 DIAGNOSIS — Z20822 Contact with and (suspected) exposure to covid-19: Secondary | ICD-10-CM | POA: Diagnosis not present

## 2023-03-23 DIAGNOSIS — R0602 Shortness of breath: Secondary | ICD-10-CM | POA: Insufficient documentation

## 2023-03-23 DIAGNOSIS — Z7901 Long term (current) use of anticoagulants: Secondary | ICD-10-CM | POA: Insufficient documentation

## 2023-03-23 DIAGNOSIS — R262 Difficulty in walking, not elsewhere classified: Secondary | ICD-10-CM | POA: Insufficient documentation

## 2023-03-23 DIAGNOSIS — I4891 Unspecified atrial fibrillation: Secondary | ICD-10-CM | POA: Diagnosis not present

## 2023-03-23 DIAGNOSIS — R197 Diarrhea, unspecified: Secondary | ICD-10-CM | POA: Diagnosis not present

## 2023-03-23 DIAGNOSIS — I10 Essential (primary) hypertension: Secondary | ICD-10-CM | POA: Diagnosis not present

## 2023-03-23 DIAGNOSIS — N179 Acute kidney failure, unspecified: Secondary | ICD-10-CM | POA: Insufficient documentation

## 2023-03-23 DIAGNOSIS — B338 Other specified viral diseases: Secondary | ICD-10-CM | POA: Insufficient documentation

## 2023-03-23 DIAGNOSIS — B974 Respiratory syncytial virus as the cause of diseases classified elsewhere: Secondary | ICD-10-CM | POA: Diagnosis not present

## 2023-03-23 DIAGNOSIS — R531 Weakness: Secondary | ICD-10-CM | POA: Diagnosis not present

## 2023-03-23 DIAGNOSIS — R918 Other nonspecific abnormal finding of lung field: Secondary | ICD-10-CM | POA: Diagnosis not present

## 2023-03-23 LAB — CBC WITH DIFFERENTIAL/PLATELET
Abs Immature Granulocytes: 0.04 10*3/uL (ref 0.00–0.07)
Basophils Absolute: 0 10*3/uL (ref 0.0–0.1)
Basophils Relative: 0 %
Eosinophils Absolute: 0 10*3/uL (ref 0.0–0.5)
Eosinophils Relative: 0 %
HCT: 41.8 % (ref 36.0–46.0)
Hemoglobin: 13.3 g/dL (ref 12.0–15.0)
Immature Granulocytes: 1 %
Lymphocytes Relative: 17 %
Lymphs Abs: 1.2 10*3/uL (ref 0.7–4.0)
MCH: 30.2 pg (ref 26.0–34.0)
MCHC: 31.8 g/dL (ref 30.0–36.0)
MCV: 95 fL (ref 80.0–100.0)
Monocytes Absolute: 0.5 10*3/uL (ref 0.1–1.0)
Monocytes Relative: 7 %
Neutro Abs: 5 10*3/uL (ref 1.7–7.7)
Neutrophils Relative %: 75 %
Platelets: 230 10*3/uL (ref 150–400)
RBC: 4.4 MIL/uL (ref 3.87–5.11)
RDW: 13.9 % (ref 11.5–15.5)
WBC: 6.8 10*3/uL (ref 4.0–10.5)
nRBC: 0 % (ref 0.0–0.2)

## 2023-03-23 LAB — BASIC METABOLIC PANEL
Anion gap: 14 (ref 5–15)
BUN: 65 mg/dL — ABNORMAL HIGH (ref 8–23)
CO2: 28 mmol/L (ref 22–32)
Calcium: 9.9 mg/dL (ref 8.9–10.3)
Chloride: 100 mmol/L (ref 98–111)
Creatinine, Ser: 1.56 mg/dL — ABNORMAL HIGH (ref 0.44–1.00)
GFR, Estimated: 31 mL/min — ABNORMAL LOW (ref >60)
Glucose, Bld: 134 mg/dL — ABNORMAL HIGH (ref 70–99)
Potassium: 4.2 mmol/L (ref 3.5–5.1)
Sodium: 142 mmol/L (ref 135–145)

## 2023-03-23 LAB — RESP PANEL BY RT-PCR (RSV, FLU A&B, COVID)  RVPGX2
Influenza A by PCR: NEGATIVE
Influenza B by PCR: NEGATIVE
Resp Syncytial Virus by PCR: POSITIVE — AB
SARS Coronavirus 2 by RT PCR: NEGATIVE

## 2023-03-23 MED ORDER — HYDROCHLOROTHIAZIDE 12.5 MG PO TABS
12.5000 mg | ORAL_TABLET | Freq: Every day | ORAL | Status: DC
  Administered 2023-03-24 – 2023-03-26 (×3): 12.5 mg via ORAL
  Filled 2023-03-23 (×3): qty 1

## 2023-03-23 MED ORDER — LOPERAMIDE HCL 2 MG PO CAPS
4.0000 mg | ORAL_CAPSULE | Freq: Two times a day (BID) | ORAL | Status: DC
  Administered 2023-03-24 – 2023-03-26 (×5): 4 mg via ORAL
  Filled 2023-03-23 (×5): qty 2

## 2023-03-23 MED ORDER — CARVEDILOL 3.125 MG PO TABS
6.2500 mg | ORAL_TABLET | Freq: Once | ORAL | Status: AC
  Administered 2023-03-23: 6.25 mg via ORAL
  Filled 2023-03-23: qty 2

## 2023-03-23 MED ORDER — CARVEDILOL 3.125 MG PO TABS
6.2500 mg | ORAL_TABLET | Freq: Two times a day (BID) | ORAL | Status: DC
  Administered 2023-03-24 – 2023-03-26 (×5): 6.25 mg via ORAL
  Filled 2023-03-23 (×5): qty 2

## 2023-03-23 MED ORDER — ACETAMINOPHEN 325 MG PO TABS
650.0000 mg | ORAL_TABLET | Freq: Three times a day (TID) | ORAL | Status: DC | PRN
  Administered 2023-03-26: 650 mg via ORAL
  Filled 2023-03-23: qty 2

## 2023-03-23 MED ORDER — HYDROCHLOROTHIAZIDE 12.5 MG PO TABS
12.5000 mg | ORAL_TABLET | Freq: Once | ORAL | Status: AC
  Administered 2023-03-23: 12.5 mg via ORAL
  Filled 2023-03-23: qty 1

## 2023-03-23 MED ORDER — BOOST HIGH PROTEIN PO LIQD: 1.0000 | ORAL | Status: DC

## 2023-03-23 MED ORDER — LORATADINE 10 MG PO TABS
10.0000 mg | ORAL_TABLET | Freq: Every day | ORAL | Status: DC
  Administered 2023-03-24 – 2023-03-26 (×4): 10 mg via ORAL
  Filled 2023-03-23 (×4): qty 1

## 2023-03-23 MED ORDER — APIXABAN 2.5 MG PO TABS
2.5000 mg | ORAL_TABLET | Freq: Two times a day (BID) | ORAL | Status: DC
  Administered 2023-03-24 – 2023-03-26 (×6): 2.5 mg via ORAL
  Filled 2023-03-23 (×6): qty 1

## 2023-03-23 MED ORDER — BENZONATATE 100 MG PO CAPS: 100.0000 mg | ORAL_CAPSULE | Freq: Three times a day (TID) | ORAL | 0 refills | Status: AC

## 2023-03-23 MED ORDER — LACTATED RINGERS IV BOLUS
1000.0000 mL | Freq: Once | INTRAVENOUS | Status: AC
  Administered 2023-03-23: 1000 mL via INTRAVENOUS

## 2023-03-23 NOTE — ED Triage Notes (Signed)
Pt bib ems c.o malaise since Monday, not eating. Denies fever/chills.  Rhonchi, RSV going around at facility.  Denies n/v +diarrhea

## 2023-03-23 NOTE — ED Notes (Signed)
Update given to pts son

## 2023-03-23 NOTE — Discharge Instructions (Signed)
You were seen in the emergency department for your cough and fatigue.  You did test positive for RSV which is a respiratory virus but had no signs of pneumonia.  You did appear slightly dehydrated on your labs and we gave you some fluids and you can follow-up with your primary doctor to have your kidney function rechecked.  I have given you a cough medicine that you can take as needed and you should make sure you are drinking plenty of fluids and staying well-hydrated.  You should return to the emergency department if you have worsening shortness of breath, develop fevers or any other new or concerning symptoms.

## 2023-03-23 NOTE — ED Provider Notes (Signed)
[place] EMERGENCY DEPARTMENT AT [facility] Provider Note   CSN: [id number] Arrival date & time: 03/23/23  1420     History  Chief Complaint  Patient presents with  . Fatigue  . Diarrhea    [name] is a 89 y.o. female.  Patient is a 89 year old female with a past medical history of A-fib on Eliquis and partial colectomy with chronic diarrhea presenting to the emergency department with cough and fatigue.  The patient states that she has been coughing for about the last week.  She states it is nonproductive and denies significant shortness of breath.  Denies any fever.  She states that she has had increasing fatigue and a decreased appetite.  She states that she has still been drinking liquids.  She denies any nausea or vomiting.  She states that she does have diarrhea every time she eats but states that this is not significantly changed from her baseline diarrhea.  She denies any dysuria or hematuria.  Per EMS multiple people at her facility have tested positive for RSV recently.  The history is provided by the patient.  Diarrhea      Home Medications Prior to Admission medications   Medication Sig Start Date End Date Taking? Authorizing Provider  benzonatate (TESSALON) 100 MG capsule Take 1 capsule (100 mg total) by mouth every 8 (eight) hours. 03/23/23  Yes Elayne Snare K, DO  acetaminophen (TYLENOL) 650 MG CR tablet Take 650 mg by mouth every 8 (eight) hours as needed for pain.    [provider]  apixaban (ELIQUIS) 2.5 MG TABS tablet Take 1 tablet (2.5 mg total) by mouth 2 (two) times daily. 03/13/23   Lewayne Bunting, MD  carvedilol (COREG) 6.25 MG tablet Take 6.25 mg by mouth 2 (two) times daily. 11/15/20   [provider]  clindamycin (CLEOCIN) 300 MG capsule Take 300 mg by mouth. For dental procedure only 05/31/22   [provider]  feeding supplement (BOOST HIGH PROTEIN) LIQD Take 1 Container by mouth daily.    [provider]  fluticasone (FLONASE) 50 MCG/ACT nasal spray Place 2 sprays into both nostrils daily. 12/31/20   Alwyn Ren, MD  hydrochlorothiazide (HYDRODIURIL) 12.5 MG tablet Take 12.5 mg by mouth daily.    [provider]  loperamide (IMODIUM) 2 MG capsule Take 1 capsule (2 mg total) by mouth 4 (four) times daily as needed for diarrhea or loose stools. Patient taking differently: Take 4 mg by mouth 2 (two) times daily before a meal. 12/21/20   Dione Booze, MD  loratadine (CLARITIN) 10 MG tablet Take 1 tablet (10 mg total) by mouth daily. 12/31/20   Alwyn Ren, MD  Multiple Vitamin (MULTIVITAMIN WITH MINERALS) TABS tablet Take 1 tablet by mouth daily. 12/25/18   Darlin Drop, DO  ondansetron (ZOFRAN) 4 MG tablet Take 1 tablet (4 mg total) by mouth every 6 (six) hours as needed for nausea. Patient not taking: Reported on 06/15/2022 04/19/22   Marguerita Merles Latif, DO      Allergies    Amoxicillin-pot clavulanate, Cephalexin, Ciprofloxacin, Hydromorphone, Lisinopril, Mirtazapine, Nitrofuran derivatives, Septra [bactrim], and Sulfamethoxazole-trimethoprim    Review of Systems   Review of Systems  Gastrointestinal:  Positive for diarrhea.    Physical Exam Updated Vital Signs BP (!) 163/93   Pulse 72   Temp 97.6 F (36.4 C) (Oral)   Resp 17   Ht 5\' 6"  (1.676 m)   Wt 45.4 kg   SpO2  100%   BMI 16.14 kg/m  Physical Exam Vitals and nursing note reviewed.  Constitutional:      General: She is not in acute distress.    Comments: Frail appearing  HENT:     Head: Normocephalic and atraumatic.     Nose: Nose normal.     Mouth/Throat:     Mouth: Mucous membranes are dry.     Pharynx: Oropharynx is clear.  Eyes:     Extraocular Movements: Extraocular movements intact.     Conjunctiva/sclera: Conjunctivae normal.  Cardiovascular:     Rate and Rhythm: Normal rate and regular rhythm.     Heart sounds: Normal heart sounds.  Pulmonary:     Effort: Pulmonary  effort is normal.     Breath sounds: Normal breath sounds.  Abdominal:     General: Abdomen is flat.     Palpations: Abdomen is soft.     Tenderness: There is no abdominal tenderness.  Musculoskeletal:        General: Normal range of motion.     Cervical back: Normal range of motion.     Right lower leg: No edema.     Left lower leg: No edema.  Skin:    General: Skin is warm and dry.  Neurological:     General: No focal deficit present.     Mental Status: She is alert and oriented to person, place, and time.     Motor: No weakness.  Psychiatric:        Mood and Affect: Mood normal.        Behavior: Behavior normal.     ED Results / Procedures / Treatments   Labs (all labs ordered are listed, but only abnormal results are displayed) Labs Reviewed  RESP PANEL BY RT-PCR (RSV, FLU A&B, COVID)  [id number] - Abnormal; Notable for the following components:      Result Value   Resp Syncytial Virus by PCR POSITIVE (*)    All other components within normal limits  BASIC METABOLIC PANEL - Abnormal; Notable for the following components:   Glucose, Bld 134 (*)    BUN 65 (*)    Creatinine, Ser 1.56 (*)    GFR, Estimated 31 (*)    All other components within normal limits  CBC WITH DIFFERENTIAL/PLATELET    EKG EKG Interpretation Date/Time:  Friday March 23 2023 14:38:21 EST Ventricular Rate:  71 PR Interval:  189 QRS Duration:  97 QT Interval:  420 QTC Calculation: 457 R Axis:   -59  Text Interpretation: Sinus rhythm Left anterior fascicular block Nonspecific T wave abnormality Artifact Confirmed by Cathren Laine ([id number]) on 03/23/2023 2:52:20 PM  Radiology DG Chest Portable 1 View Result Date: 03/23/2023 CLINICAL DATA:  short of breath EXAM: PORTABLE CHEST 1 VIEW COMPARISON:  None Available. FINDINGS: Normal cardiac silhouette Hyperinflated lungs. According device in the anterior LEFT chest wall. No acute osseous abnormality. Reverse LEFT shoulder arthroplasty no IMPRESSION: 1. No  acute cardiopulmonary process. 2. Hyperinflated lungs suggest COPD. Electronically Signed   By: Genevive Bi M.D.   On: 03/23/2023 16:03    Procedures Procedures    Medications Ordered in ED Medications  carvedilol (COREG) tablet 6.25 mg (has no administration in time range)  hydrochlorothiazide (HYDRODIURIL) tablet 12.5 mg (has no administration in time range)  lactated ringers bolus 1,000 mL (1,000 mLs Intravenous New Bag/Given 03/23/23 1617)    ED Course/ Medical Decision Making/ A&P Clinical Course as of 03/23/23 2118  Fri Mar 23, 2023  1557  Mild AKI, will be given IVF bolus [VK]  1614 CXR without acute disease. [VK]  [id number] Patient tested positive for RSV.  She is satting well on room air.  She is tolerating p.o.  She stable for discharge home with outpatient follow-up and was given strict return precautions. [VK]  2117 RN notified me that family concerned the patient is too weak to go home. TOC will be consulted. [VK]    Clinical Course User Index [VK] Rexford Maus, DO                                 Medical Decision Making This patient presents to the ED with chief complaint(s) of cough, fatigue with pertinent past medical history of a fib, chronic diarrhea which further complicates the presenting complaint. The complaint involves an extensive differential diagnosis and also carries with it a high risk of complications and morbidity.    The differential diagnosis includes viral syndrome, pneumonia, pneumothorax, pulmonary edema, pleural effusion, arrhythmia, anemia, dehydration, electrolyte abnormality, no focal neurologic deficits making CVA or TIA unlikely  Additional history obtained: Additional history obtained from EMS  Records reviewed outpatient cardiology records  ED Course and Reassessment: On patient's arrival she is hemodynamically stable, no acute distress and satting well on room air.  EKG showed this rhythm without acute ischemic changes.  Patient  has labs and chest x-ray ordered including viral swab and she will be closely reassessed.  Independent labs interpretation:  The following labs were independently interpreted: Mild increased creatinine from baseline, RSV positive  Independent visualization of imaging: - I independently visualized the following imaging with scope of interpretation limited to determining acute life threatening conditions related to emergency care: Chest x-ray, which revealed no acute disease  Consultation: - Consulted or discussed management/test interpretation w/ external professional: N/A  Consideration for admission or further workup: Patient has no emergent conditions requiring admission or further work-up at this time and is stable for discharge home with primary care follow-up  Social Determinants of health: N/A    Risk Prescription drug management.          Final Clinical Impression(s) / ED Diagnoses Final diagnoses:  AKI (acute kidney injury) (HCC)  RSV (respiratory syncytial virus infection)    Rx / DC Orders ED Discharge Orders          Ordered    benzonatate (TESSALON) 100 MG capsule  Every 8 hours        03/23/23 1743              Rexford Maus, DO 03/23/23 1743

## 2023-03-24 MED ORDER — MELATONIN 3 MG PO TABS
3.0000 mg | ORAL_TABLET | Freq: Every day | ORAL | Status: DC
  Administered 2023-03-24 – 2023-03-25 (×3): 3 mg via ORAL
  Filled 2023-03-24 (×3): qty 1

## 2023-03-24 NOTE — [place] FL2 (Signed)
[place] MEDICAID FL2 LEVEL OF CARE FORM     IDENTIFICATION  Patient Name: [name] Birthdate: [birth year] Sex: female Admission Date (Current Location): 03/23/2023  [facility] and [place] Number:  Producer, television/film/video and Address:  [facility] [place]. [facility], [address], [place], [place] [address]      Provider Number:    Attending Physician Name and Address:  System, Provider Not In  Relative Name and Phone Number:       Current Level of Care: Hospital Recommended Level of Care: Skilled Nursing Facility Prior Approval Number:    Date Approved/Denied:   PASRR Number: [id number]  Discharge Plan: SNF    Current Diagnoses: Patient Active Problem List   Diagnosis Date Noted   E. coli UTI 04/18/2022   Sacral decubitus ulcer, stage II (HCC) 04/17/2022   Acute renal failure superimposed on stage 3b chronic kidney disease (HCC) 04/17/2022   AKI (acute kidney injury) (HCC) 04/17/2022   Acute kidney injury superimposed on chronic kidney disease (HCC) 04/16/2022   Essential hypertension 04/16/2022   Secondary hypercoagulable state (HCC) 01/06/2021   Protein-calorie malnutrition, severe 12/23/2020   Diarrhea    Paroxysmal atrial fibrillation (HCC)    CHF NYHA class I (no symptoms from ordinary activities), acute, systolic (HCC)    UTI (urinary tract infection) 12/22/2020   Acute lower UTI (urinary tract infection) 12/21/2020   Hypokalemia 12/21/2020   Volume depletion 12/21/2020   Sepsis (HCC) 01/17/2019   Acute respiratory failure with hypoxia (HCC) 01/17/2019   Pneumonia due to COVID-19 virus 01/17/2019   Pulmonary nodule 01/17/2019   Thoracic aortic aneurysm (HCC) 01/17/2019   Fall 12/21/2018   Generalized weakness 12/20/2018   Pressure injury of skin 12/20/2018   Meningioma (HCC) 12/20/2018   Fall at home, initial encounter 04/19/2017   Bilateral hip dislocation (HCC) 04/19/2017   Uncontrolled pain 04/19/2017   Hypertension 04/19/2017    Leukocytosis 04/19/2017   Elevated troponin 04/19/2017   Osteoarthritis of hand 01/01/2016   Osteoarthritis of foot 01/01/2016   Unspecified osteoarthritis, unspecified site 01/01/2016   S/p reverse total shoulder arthroplasty 08/27/2014   Chronic diarrhea 12/22/2011    Orientation RESPIRATION BLADDER Height & Weight     Self, Time, Situation, Place  Normal Continent Weight: 100 lb (45.4 kg) Height:  5\' 6"  (167.6 cm)  BEHAVIORAL SYMPTOMS/MOOD NEUROLOGICAL BOWEL NUTRITION STATUS      Continent    AMBULATORY STATUS COMMUNICATION OF NEEDS Skin   Extensive Assist Verbally Normal                       Personal Care Assistance Level of Assistance  Bathing, Feeding, Dressing Bathing Assistance: Limited assistance Feeding assistance: Limited assistance Dressing Assistance: Limited assistance     Functional Limitations Info  Sight, Hearing, Speech Sight Info: Adequate Hearing Info: Adequate Speech Info: Adequate    SPECIAL CARE FACTORS FREQUENCY  PT (By licensed PT), OT (By licensed OT)                    Contractures Contractures Info: Not present    Additional Factors Info  Code Status               Current Medications (03/24/2023):  This is the current hospital active medication list Current Facility-Administered Medications  Medication Dose Route Frequency Provider Last Rate Last Admin   acetaminophen (TYLENOL) tablet 650 mg  650 mg Oral Q8H PRN Rexford Maus, DO  apixaban (ELIQUIS) tablet 2.5 mg  2.5 mg Oral BID Elayne Snare K, DO   2.5 mg at 03/24/23 0910   carvedilol (COREG) tablet 6.25 mg  6.25 mg Oral BID Elayne Snare K, DO   6.25 mg at 03/24/23 1610   feeding supplement (BOOST HIGH PROTEIN) liquid 237 mL  1 Container Oral Q24H Elayne Snare K, DO       hydrochlorothiazide (HYDRODIURIL) tablet 12.5 mg  12.5 mg Oral Daily Kingsley, Victoria K, DO   12.5 mg at 03/24/23 9604   lidocaine-EPINEPHrine (XYLOCAINE W/EPI) 2  %-1:100000 (with pres) injection 10 mL  10 mL Infiltration Once Croitoru, Mihai, MD       loperamide (IMODIUM) capsule 4 mg  4 mg Oral BID AC Kingsley, Victoria K, DO   4 mg at 03/24/23 0910   loratadine (CLARITIN) tablet 10 mg  10 mg Oral Daily Elayne Snare K, DO   10 mg at 03/24/23 5409   melatonin tablet 3 mg  3 mg Oral QHS Antony Madura, PA-C   3 mg at 03/24/23 0114   Current Outpatient Medications  Medication Sig Dispense Refill   apixaban (ELIQUIS) 2.5 MG TABS tablet Take 1 tablet (2.5 mg total) by mouth 2 (two) times daily.     benzonatate (TESSALON) 100 MG capsule Take 1 capsule (100 mg total) by mouth every 8 (eight) hours. 21 capsule 0   carvedilol (COREG) 6.25 MG tablet Take 6.25 mg by mouth 2 (two) times daily.     hydrochlorothiazide (HYDRODIURIL) 12.5 MG tablet Take 12.5 mg by mouth daily.     loperamide (IMODIUM) 2 MG capsule Take 1 capsule (2 mg total) by mouth 4 (four) times daily as needed for diarrhea or loose stools. (Patient taking differently: Take 4 mg by mouth 2 (two) times daily before a meal.) 40 capsule 0   Multiple Vitamin (MULTIVITAMIN WITH MINERALS) TABS tablet Take 1 tablet by mouth daily. 30 tablet 0   acetaminophen (TYLENOL) 650 MG CR tablet Take 650 mg by mouth every 8 (eight) hours as needed for pain.     feeding supplement (BOOST HIGH PROTEIN) LIQD Take 1 Container by mouth daily.     loratadine (CLARITIN) 10 MG tablet Take 1 tablet (10 mg total) by mouth daily. (Patient not taking: Reported on 03/24/2023) 30 tablet 1     Discharge Medications: Please see discharge summary for a list of discharge medications.  Relevant Imaging Results:  Relevant Lab Results:   Additional Information SS#[id number]  Deatra Robinson, [place]

## 2023-03-24 NOTE — ED Notes (Signed)
Toileting offered. Pt to let staff know when she needs to use the bathroom or when brief needs changed.

## 2023-03-24 NOTE — ED Notes (Signed)
Pt ambulated from stretcher to bed w/ 2 person assist. Pt very weak. Pt tolerated transfer well though. Brief changed, acclimated to zone and call bell plugged in and given to pt. Pt A&Ox4, incontinent x 2 per report, takes meds whole. Fall precautions implemented.

## 2023-03-24 NOTE — ED Notes (Signed)
Spoke to Gordy Savers patients son per his request.

## 2023-03-24 NOTE — Evaluation (Signed)
Physical Therapy Evaluation Patient Details Name: [name] MRN: [id number] DOB: 1931/02/23 Today's Date: 03/24/2023  History of Present Illness  Patient is 89 y.o. female presenting to the emergency department with cough and fatigue. Pt positive for RSV. PMH significant of A-fib on Eliquis and partial colectomy with chronic diarrhea.   Clinical Impression  [name] is 89 y.o. female admitted with above HPI and diagnosis. Patient is currently limited by functional impairments below (see PT problem list). Patient lives at [facility] IL[facility] and is mod ind with RW for transfers and gait at baseline. Currently pt requires min assist for bed mobility, transfers and gait with RW. Pt with flexed posture and bil knees flexed in stance. Patient will benefit from continued skilled PT interventions to address impairments and progress independence with mobility. Patient will benefit from continued inpatient follow up therapy, <3 hours/day. Acute PT will follow and progress as able.         If plan is discharge home, recommend the following: A little help with walking and/or transfers;A little help with bathing/dressing/bathroom;Assistance with cooking/housework;Direct supervision/assist for medications management;Assist for transportation;Help with stairs or ramp for entrance   Can travel by private vehicle   Yes    Equipment Recommendations None recommended by PT  Recommendations for Other Services       Functional Status Assessment Patient has had a recent decline in their functional status and demonstrates the ability to make significant improvements in function in a reasonable and predictable amount of time.     Precautions / Restrictions Precautions Precautions: Fall Restrictions Weight Bearing Restrictions Per Provider Order: No      Mobility  Bed Mobility Overal bed mobility: Needs Assistance Bed Mobility: Supine to Sit, Sit to Supine     Supine to sit: Min assist, HOB  elevated Sit to supine: Min assist, HOB elevated   General bed mobility comments: cues to initiate and bring LE's off EOB. min assit to raise trunk upright and bring LE's onto bed for return to supine.    Transfers Overall transfer level: Needs assistance Equipment used: Rolling walker (2 wheels) Transfers: Sit to/from Stand Sit to Stand: Min assist           General transfer comment: cues for safe hand placement, min assist to steady with rise to RW and for safety from edge of stretcher.    Ambulation/Gait Ambulation/Gait assistance: Min assist Gait Distance (Feet): 25 Feet Assistive device: Rolling walker (2 wheels) Gait Pattern/deviations: Step-through pattern, Decreased stride length, Knee flexed in stance - right, Knee flexed in stance - left, Trunk flexed, Narrow base of support Gait velocity: decr     General Gait Details: min assist to steady with cues for posture and proximity to RW throughout.  Stairs            Wheelchair Mobility     Tilt Bed    Modified Rankin (Stroke Patients Only)       Balance Overall balance assessment: Needs assistance Sitting-balance support: Feet supported Sitting balance-Leahy Scale: Fair     Standing balance support: Bilateral upper extremity supported, During functional activity, Reliant on assistive device for balance Standing balance-Leahy Scale: Poor                               Pertinent Vitals/Pain      Home Living Family/patient expects to be discharged to:: Private residence Living Arrangements: Alone ([facility])  Type of Home: Independent living facility (Apartment) Home Access: Level entry       Home Layout: One level Home Equipment: Agricultural consultant (2 wheels);Shower seat - built in;Grab bars - tub/shower;Grab bars - toilet Additional Comments: son lives in [place]    Prior Function Prior Level of Function : Independent/Modified Independent                      Extremity/Trunk Assessment   Upper Extremity Assessment Upper Extremity Assessment: Generalized weakness;RUE deficits/detail RUE Deficits / Details: ROM limited, pt reports chronic Rt shuolder OA    Lower Extremity Assessment Lower Extremity Assessment: Generalized weakness    Cervical / Trunk Assessment Cervical / Trunk Assessment: Kyphotic  Communication   Communication Communication:  (slightly HOH)  Cognition Arousal: Alert Behavior During Therapy: WFL for tasks assessed/performed Overall Cognitive Status: Within Functional Limits for tasks assessed                                 General Comments: decreased awareness of safety and deficits        General Comments      Exercises     Assessment/Plan    PT Assessment Patient needs continued PT services  PT Problem List Decreased range of motion;Decreased strength;Decreased balance;Decreased activity tolerance;Decreased mobility;Decreased knowledge of precautions;Decreased safety awareness;Decreased knowledge of use of DME;Decreased skin integrity       PT Treatment Interventions DME instruction;Gait training;Functional mobility training;Therapeutic activities;Therapeutic exercise;Balance training;Patient/family education;Wheelchair mobility training    PT Goals (Current goals can be found in the Care Plan section)  Acute Rehab PT Goals Patient Stated Goal: regain strength and independence with mobility PT Goal Formulation: With patient Time For Goal Achievement: 04/07/23 Potential to Achieve Goals: Good    Frequency Min 1X/week     Co-evaluation               AM-PAC PT "6 Clicks" Mobility  Outcome Measure Help needed turning from your back to your side while in a flat bed without using bedrails?: A Little Help needed moving from lying on your back to sitting on the side of a flat bed without using bedrails?: A Little Help needed moving to and from a bed to a chair (including a  wheelchair)?: A Little Help needed standing up from a chair using your arms (e.g., wheelchair or bedside chair)?: A Little Help needed to walk in hospital room?: A Little Help needed climbing 3-5 steps with a railing? : Total 6 Click Score: 16    End of Session Equipment Utilized During Treatment: Gait belt Activity Tolerance: Patient tolerated treatment well Patient left: in bed;with call bell/phone within reach Nurse Communication: Mobility status PT Visit Diagnosis: Muscle weakness (generalized) (M62.81);Difficulty in walking, not elsewhere classified (R26.2)    Time: 1610-9604 PT Time Calculation (min) (ACUTE ONLY): 28 min   Charges:   PT Evaluation $PT Eval Moderate Complexity: 1 Mod PT Treatments $Gait Training: 8-22 mins PT General Charges $$ ACUTE PT VISIT: 1 Visit         Wynn Maudlin, DPT Acute Rehabilitation Services Office [phone]  03/24/23 12:27 PM

## 2023-03-24 NOTE — TOC Progression Note (Signed)
Spoke to pt and pt's friend Nannette re PT recommendation for SNF. Pt from [facility] IL[facility] where she lives alone and is supported by her friend Nannette for supplies and appointments. Pt with a recent SNF stay at [facility] and is aware her insurance will mostly be in network with one star facilities. Pt and friend agreeable to SW starting SNF search but state they will refuse "low rated facilities."   Discussed private pay in-home caregivers and pt states [facility] has an aide program through a company called [employer]" but these services have not officially started yet. Pt is also open to receiving a list of agencies for in-home caregivers and is aware this is a private pay cost.   CM aware of potential for [facility] and aide service needs. Will begin SNF search and f/u with offers as available.   Dellie Burns, MSW, LCSW [phone] (coverage)

## 2023-03-24 NOTE — ED Provider Notes (Signed)
Emergency Medicine Observation Re-evaluation Note  [name] is a 89 y.o. female, seen on rounds today.  Pt initially presented to the ED for complaints of Fatigue and Diarrhea Currently, the patient is resting, she is eating breakfast. No concerns from nursing staff.  Patient has history of CHF, A-fib, hypertension and was diagnosed with the RSV.  She was brought to the ER because of increasing weakness.  Labs indicated slightly elevated creatinine.  She resides at a facility, RSV going around.  Physical Exam  BP (!) 156/94 (BP Location: Right Arm)   Pulse 69   Temp 97.6 F (36.4 C) (Oral)   Resp 18   Ht 5\' 6"  (1.676 m)   Wt 45.4 kg   SpO2 97%   BMI 16.14 kg/m  Physical Exam General: No acute distress Cardiac: Regular rate Lungs: No respiratory distress Psych: Currently calm  ED Course / MDM  EKG:EKG Interpretation Date/Time:  Friday March 23 2023 14:38:21 EST Ventricular Rate:  71 PR Interval:  189 QRS Duration:  97 QT Interval:  420 QTC Calculation: 457 R Axis:   -59  Text Interpretation: Sinus rhythm Left anterior fascicular block Nonspecific T wave abnormality Artifact Confirmed by Cathren Laine ([id number]) on 03/23/2023 2:52:20 PM  I have reviewed the labs performed to date as well as medications administered while in observation.  Recent changes in the last 24 hours include -no new changes, awaiting PT evaluation.  Plan  Current plan is for for PT to eval.  It is unclear to me why patient is in the ER, in the setting of her being already at a nursing home.  I understand there is increasing weakness, reduced p.o. intake but her labs are fine and she already has been medically cleared.  We will touch base with family.  12:46 PM Patient currently resides at independent living and family thinks she is not safe to return to that level of care.  PT pending.    Derwood Kaplan, MD 03/24/23 1246

## 2023-03-24 NOTE — ED Notes (Signed)
Pt belongings in bag on counter in room, red cell phone in hand

## 2023-03-24 NOTE — Progress Notes (Signed)
CSW received consult for patient for SNF placement. Patient has [employer] and there are limited SNF's that take this insurance.  CSW spoke with Marissa at [facility] who states patient is from the independent living side of the facility. Marissa states there is no preference on which home health agency patient can use.  CSW attempted to reach patient without success, no voicemail option available. CSW notified RN of information and requested RN inform patient to return call to CSW.  Edwin Dada, MSW, LCSW Transitions of Care  Clinical Social Worker II [phone]

## 2023-03-24 NOTE — ED Notes (Signed)
Per pharmacy, Boost High Protein is unavailable. Notified EDP and awaiting recommendations.

## 2023-03-24 NOTE — ED Notes (Addendum)
Pt finished her dinner. This RN removed tray and cleaned the patient up. New gown placed on pt and fresh linens provided and bed made at this time.

## 2023-03-24 NOTE — ED Notes (Signed)
Updated pt's son.

## 2023-03-24 NOTE — ED Notes (Signed)
Friend at bedside visiting.

## 2023-03-25 NOTE — ED Provider Notes (Signed)
Emergency Medicine Observation Re-evaluation Note  [name] is a 89 y.o. female, seen on rounds today.  Pt initially presented to the ED for complaints of Fatigue and Diarrhea Currently, the patient is resting comfortably, no complaints from the patient on the nursing side.    Patient has history of CHF, A-fib, hypertension and was diagnosed with the RSV.  She was brought to the ER because of increasing weakness.  Labs indicated slightly elevated creatinine.    Physical Exam  BP (!) 181/73 (BP Location: Left Arm)   Pulse 73   Temp 99.7 F (37.6 C) (Oral)   Resp 16   Ht 5\' 6"  (1.676 m)   Wt 45.4 kg   SpO2 99%   BMI 16.14 kg/m  Physical Exam General: No acute distress Cardiac: Regular rate Lungs: No respiratory distress Psych: Currently calm  ED Course / MDM  EKG:EKG Interpretation Date/Time:  Friday March 23 2023 14:38:21 EST Ventricular Rate:  71 PR Interval:  189 QRS Duration:  97 QT Interval:  420 QTC Calculation: 457 R Axis:   -59  Text Interpretation: Sinus rhythm Left anterior fascicular block Nonspecific T wave abnormality Artifact Confirmed by Cathren Laine ([id number]) on 03/23/2023 2:52:20 PM  I have reviewed the labs performed to date as well as medications administered while in observation.  Recent changes in the last 24 hours include -no acute changes.  Patient awaiting placement or home health assistance given her weakness due to RSV.  Plan  Current plan is for holding patient for placement.    Derwood Kaplan, MD 03/25/23 1140

## 2023-03-25 NOTE — ED Notes (Signed)
Pt had a small BM, a new brief was placed and warm blanket provided.

## 2023-03-25 NOTE — ED Notes (Signed)
Pt had a BM, Pt cleaned and pericare performed, new brief placed at this time.

## 2023-03-26 NOTE — ED Notes (Signed)
Patient ambulate with assistant to the bathroom, using a walker. Patient needed help sitting up on the side of bed. She is still weak.

## 2023-03-26 NOTE — Progress Notes (Addendum)
10am: CSW received call from [place] at [employer] for [employer] who states a caregiver can meet patient at [facility] at 12pm.  CSW notified MD and RN of information.  Patient will be discharged to [facility] via [facility] - RN to call. No number for report as patient lives in independent living.  9:45am: CSW received call from Nanette who states she spoke with patient and she desires to return to [facility] with help from [facility] [employer].  CSW returned call to [place] who states she provided main office with CSW contact information and a call should be received soon.  8:20am: CSW attempted to reach patient via phone - no voicemail option available.  CSW spoke with patient's friend and POA Nanette to present her with bed offer from [facility]. Nanette states based on patient's previous experience at [facility] prior to moving into [facility]. Nanette states patient prefers to discharge back to [facility] with services through the facility for ADL's. Nanette provided CSW with contact information for Cordelia Pen [phone] @ [employer] [facility] [employer].  CSW spoke with Cordelia Pen who states patient is already set up for services through the agency and services were supposed to start on Saturday 1/25. Cordelia Pen states she will call the main office to determine what time a service provider can be at the patient's apartment to assist her and will return call to CSW.  Edwin Dada, MSW, LCSW Transitions of Care  Clinical Social Worker II [phone]

## 2023-03-26 NOTE — ED Provider Notes (Signed)
Emergency Medicine Observation Re-evaluation Note  [name] is a 89 y.o. female, seen on rounds today.  Pt initially presented to the ED for complaints of Fatigue and Diarrhea Currently, the patient is taking her morning meds and has no complaints.  Physical Exam  BP (!) 146/81 (BP Location: Right Arm)   Pulse 70   Temp 97.7 F (36.5 C) (Oral)   Resp 16   Ht 5\' 6"  (1.676 m)   Wt 45.4 kg   SpO2 96%   BMI 16.14 kg/m  Physical Exam General: nad Cardiac: regular rate Lungs: no resp issues at this time.  Breath sounds clear Psych: awake alert and appropriate.  ED Course / MDM  EKG:EKG Interpretation Date/Time:  Friday March 23 2023 14:38:21 EST Ventricular Rate:  71 PR Interval:  189 QRS Duration:  97 QT Interval:  420 QTC Calculation: 457 R Axis:   -59  Text Interpretation: Sinus rhythm Left anterior fascicular block Nonspecific T wave abnormality Artifact Confirmed by Cathren Laine ([id number]) on 03/23/2023 2:52:20 PM  I have reviewed the labs performed to date as well as medications administered while in observation.  Recent changes in the last 24 hours include none.  Plan  Current plan is for pt returning to [facility] today and a caregiver will be there at 12.  Pt states she will use a walker at home and the caregiver is new for her.  She is ready to go home.    Gwyneth Sprout, MD 03/26/23 1023

## 2023-03-26 NOTE — ED Notes (Signed)
Patient received a bath and total bed change

## 2023-03-26 NOTE — ED Notes (Signed)
She was given breakfast

## 2023-04-09 DIAGNOSIS — L98421 Non-pressure chronic ulcer of back limited to breakdown of skin: Secondary | ICD-10-CM | POA: Diagnosis not present

## 2023-04-09 DIAGNOSIS — R5381 Other malaise: Secondary | ICD-10-CM | POA: Diagnosis not present

## 2023-04-09 DIAGNOSIS — Z23 Encounter for immunization: Secondary | ICD-10-CM | POA: Diagnosis not present

## 2023-04-13 ENCOUNTER — Ambulatory Visit (INDEPENDENT_AMBULATORY_CARE_PROVIDER_SITE_OTHER): Payer: Medicare Other

## 2023-04-13 DIAGNOSIS — I480 Paroxysmal atrial fibrillation: Secondary | ICD-10-CM | POA: Diagnosis not present

## 2023-04-13 DIAGNOSIS — Z85828 Personal history of other malignant neoplasm of skin: Secondary | ICD-10-CM | POA: Diagnosis not present

## 2023-04-13 DIAGNOSIS — Z87440 Personal history of urinary (tract) infections: Secondary | ICD-10-CM | POA: Diagnosis not present

## 2023-04-13 DIAGNOSIS — M810 Age-related osteoporosis without current pathological fracture: Secondary | ICD-10-CM | POA: Diagnosis not present

## 2023-04-13 DIAGNOSIS — G629 Polyneuropathy, unspecified: Secondary | ICD-10-CM | POA: Diagnosis not present

## 2023-04-13 DIAGNOSIS — Z7901 Long term (current) use of anticoagulants: Secondary | ICD-10-CM | POA: Diagnosis not present

## 2023-04-13 DIAGNOSIS — M069 Rheumatoid arthritis, unspecified: Secondary | ICD-10-CM | POA: Diagnosis not present

## 2023-04-13 DIAGNOSIS — Z87891 Personal history of nicotine dependence: Secondary | ICD-10-CM | POA: Diagnosis not present

## 2023-04-13 DIAGNOSIS — F32A Depression, unspecified: Secondary | ICD-10-CM | POA: Diagnosis not present

## 2023-04-13 DIAGNOSIS — Z556 Problems related to health literacy: Secondary | ICD-10-CM | POA: Diagnosis not present

## 2023-04-13 DIAGNOSIS — I10 Essential (primary) hypertension: Secondary | ICD-10-CM | POA: Diagnosis not present

## 2023-04-13 DIAGNOSIS — B974 Respiratory syncytial virus as the cause of diseases classified elsewhere: Secondary | ICD-10-CM | POA: Diagnosis not present

## 2023-04-13 DIAGNOSIS — M150 Primary generalized (osteo)arthritis: Secondary | ICD-10-CM | POA: Diagnosis not present

## 2023-04-13 DIAGNOSIS — N179 Acute kidney failure, unspecified: Secondary | ICD-10-CM | POA: Diagnosis not present

## 2023-04-13 LAB — CUP PACEART REMOTE DEVICE CHECK
Date Time Interrogation Session: 20250213230526
Implantable Pulse Generator Implant Date: 20221212

## 2023-04-13 NOTE — Progress Notes (Signed)
[facility] Summary Report / Loop Recorder

## 2023-05-03 DIAGNOSIS — I5020 Unspecified systolic (congestive) heart failure: Secondary | ICD-10-CM | POA: Diagnosis not present

## 2023-05-03 DIAGNOSIS — R54 Age-related physical debility: Secondary | ICD-10-CM | POA: Diagnosis not present

## 2023-05-03 DIAGNOSIS — N3946 Mixed incontinence: Secondary | ICD-10-CM | POA: Diagnosis not present

## 2023-05-03 DIAGNOSIS — N1832 Chronic kidney disease, stage 3b: Secondary | ICD-10-CM | POA: Diagnosis not present

## 2023-05-03 DIAGNOSIS — I129 Hypertensive chronic kidney disease with stage 1 through stage 4 chronic kidney disease, or unspecified chronic kidney disease: Secondary | ICD-10-CM | POA: Diagnosis not present

## 2023-05-03 DIAGNOSIS — K529 Noninfective gastroenteritis and colitis, unspecified: Secondary | ICD-10-CM | POA: Diagnosis not present

## 2023-05-03 DIAGNOSIS — E46 Unspecified protein-calorie malnutrition: Secondary | ICD-10-CM | POA: Diagnosis not present

## 2023-05-07 DIAGNOSIS — Z7901 Long term (current) use of anticoagulants: Secondary | ICD-10-CM | POA: Diagnosis not present

## 2023-05-07 DIAGNOSIS — G629 Polyneuropathy, unspecified: Secondary | ICD-10-CM | POA: Diagnosis not present

## 2023-05-07 DIAGNOSIS — Z556 Problems related to health literacy: Secondary | ICD-10-CM | POA: Diagnosis not present

## 2023-05-07 DIAGNOSIS — Z87891 Personal history of nicotine dependence: Secondary | ICD-10-CM | POA: Diagnosis not present

## 2023-05-07 DIAGNOSIS — N179 Acute kidney failure, unspecified: Secondary | ICD-10-CM | POA: Diagnosis not present

## 2023-05-07 DIAGNOSIS — I10 Essential (primary) hypertension: Secondary | ICD-10-CM | POA: Diagnosis not present

## 2023-05-07 DIAGNOSIS — Z85828 Personal history of other malignant neoplasm of skin: Secondary | ICD-10-CM | POA: Diagnosis not present

## 2023-05-07 DIAGNOSIS — B974 Respiratory syncytial virus as the cause of diseases classified elsewhere: Secondary | ICD-10-CM | POA: Diagnosis not present

## 2023-05-07 DIAGNOSIS — Z87440 Personal history of urinary (tract) infections: Secondary | ICD-10-CM | POA: Diagnosis not present

## 2023-05-07 DIAGNOSIS — M810 Age-related osteoporosis without current pathological fracture: Secondary | ICD-10-CM | POA: Diagnosis not present

## 2023-05-07 DIAGNOSIS — M150 Primary generalized (osteo)arthritis: Secondary | ICD-10-CM | POA: Diagnosis not present

## 2023-05-07 DIAGNOSIS — M069 Rheumatoid arthritis, unspecified: Secondary | ICD-10-CM | POA: Diagnosis not present

## 2023-05-07 DIAGNOSIS — F32A Depression, unspecified: Secondary | ICD-10-CM | POA: Diagnosis not present

## 2023-05-15 NOTE — Progress Notes (Signed)
 Carelink Summary Report / Loop Recorder

## 2023-05-18 ENCOUNTER — Ambulatory Visit (INDEPENDENT_AMBULATORY_CARE_PROVIDER_SITE_OTHER): Payer: Medicare Other

## 2023-05-18 DIAGNOSIS — I480 Paroxysmal atrial fibrillation: Secondary | ICD-10-CM

## 2023-05-18 LAB — CUP PACEART REMOTE DEVICE CHECK
Date Time Interrogation Session: 20250320230445
Implantable Pulse Generator Implant Date: 20221212

## 2023-06-18 DIAGNOSIS — I10 Essential (primary) hypertension: Secondary | ICD-10-CM | POA: Diagnosis not present

## 2023-06-18 DIAGNOSIS — M810 Age-related osteoporosis without current pathological fracture: Secondary | ICD-10-CM | POA: Diagnosis not present

## 2023-06-18 DIAGNOSIS — Z87440 Personal history of urinary (tract) infections: Secondary | ICD-10-CM | POA: Diagnosis not present

## 2023-06-18 DIAGNOSIS — M150 Primary generalized (osteo)arthritis: Secondary | ICD-10-CM | POA: Diagnosis not present

## 2023-06-18 DIAGNOSIS — Z87891 Personal history of nicotine dependence: Secondary | ICD-10-CM | POA: Diagnosis not present

## 2023-06-18 DIAGNOSIS — F32A Depression, unspecified: Secondary | ICD-10-CM | POA: Diagnosis not present

## 2023-06-18 DIAGNOSIS — G629 Polyneuropathy, unspecified: Secondary | ICD-10-CM | POA: Diagnosis not present

## 2023-06-18 DIAGNOSIS — Z556 Problems related to health literacy: Secondary | ICD-10-CM | POA: Diagnosis not present

## 2023-06-18 DIAGNOSIS — M069 Rheumatoid arthritis, unspecified: Secondary | ICD-10-CM | POA: Diagnosis not present

## 2023-06-18 DIAGNOSIS — Z85828 Personal history of other malignant neoplasm of skin: Secondary | ICD-10-CM | POA: Diagnosis not present

## 2023-06-18 DIAGNOSIS — Z7901 Long term (current) use of anticoagulants: Secondary | ICD-10-CM | POA: Diagnosis not present

## 2023-06-22 ENCOUNTER — Ambulatory Visit (INDEPENDENT_AMBULATORY_CARE_PROVIDER_SITE_OTHER): Payer: Medicare Other

## 2023-06-22 DIAGNOSIS — I480 Paroxysmal atrial fibrillation: Secondary | ICD-10-CM | POA: Diagnosis not present

## 2023-06-22 LAB — CUP PACEART REMOTE DEVICE CHECK
Date Time Interrogation Session: 20250424230638
Implantable Pulse Generator Implant Date: 20221212

## 2023-06-26 NOTE — Progress Notes (Signed)
 Carelink Summary Report / Loop Recorder

## 2023-07-03 DIAGNOSIS — Z87440 Personal history of urinary (tract) infections: Secondary | ICD-10-CM | POA: Diagnosis not present

## 2023-07-03 DIAGNOSIS — Z7901 Long term (current) use of anticoagulants: Secondary | ICD-10-CM | POA: Diagnosis not present

## 2023-07-03 DIAGNOSIS — M150 Primary generalized (osteo)arthritis: Secondary | ICD-10-CM | POA: Diagnosis not present

## 2023-07-03 DIAGNOSIS — Z87891 Personal history of nicotine dependence: Secondary | ICD-10-CM | POA: Diagnosis not present

## 2023-07-03 DIAGNOSIS — I10 Essential (primary) hypertension: Secondary | ICD-10-CM | POA: Diagnosis not present

## 2023-07-03 DIAGNOSIS — G629 Polyneuropathy, unspecified: Secondary | ICD-10-CM | POA: Diagnosis not present

## 2023-07-03 DIAGNOSIS — Z556 Problems related to health literacy: Secondary | ICD-10-CM | POA: Diagnosis not present

## 2023-07-03 DIAGNOSIS — F32A Depression, unspecified: Secondary | ICD-10-CM | POA: Diagnosis not present

## 2023-07-03 DIAGNOSIS — M810 Age-related osteoporosis without current pathological fracture: Secondary | ICD-10-CM | POA: Diagnosis not present

## 2023-07-03 DIAGNOSIS — Z85828 Personal history of other malignant neoplasm of skin: Secondary | ICD-10-CM | POA: Diagnosis not present

## 2023-07-03 DIAGNOSIS — M069 Rheumatoid arthritis, unspecified: Secondary | ICD-10-CM | POA: Diagnosis not present

## 2023-07-10 DIAGNOSIS — G629 Polyneuropathy, unspecified: Secondary | ICD-10-CM | POA: Diagnosis not present

## 2023-07-10 DIAGNOSIS — M810 Age-related osteoporosis without current pathological fracture: Secondary | ICD-10-CM | POA: Diagnosis not present

## 2023-07-10 DIAGNOSIS — F32A Depression, unspecified: Secondary | ICD-10-CM | POA: Diagnosis not present

## 2023-07-10 DIAGNOSIS — Z85828 Personal history of other malignant neoplasm of skin: Secondary | ICD-10-CM | POA: Diagnosis not present

## 2023-07-10 DIAGNOSIS — M069 Rheumatoid arthritis, unspecified: Secondary | ICD-10-CM | POA: Diagnosis not present

## 2023-07-10 DIAGNOSIS — M150 Primary generalized (osteo)arthritis: Secondary | ICD-10-CM | POA: Diagnosis not present

## 2023-07-10 DIAGNOSIS — I10 Essential (primary) hypertension: Secondary | ICD-10-CM | POA: Diagnosis not present

## 2023-07-10 DIAGNOSIS — Z87891 Personal history of nicotine dependence: Secondary | ICD-10-CM | POA: Diagnosis not present

## 2023-07-10 DIAGNOSIS — Z556 Problems related to health literacy: Secondary | ICD-10-CM | POA: Diagnosis not present

## 2023-07-10 DIAGNOSIS — Z7901 Long term (current) use of anticoagulants: Secondary | ICD-10-CM | POA: Diagnosis not present

## 2023-07-10 DIAGNOSIS — Z87440 Personal history of urinary (tract) infections: Secondary | ICD-10-CM | POA: Diagnosis not present

## 2023-07-27 ENCOUNTER — Ambulatory Visit (INDEPENDENT_AMBULATORY_CARE_PROVIDER_SITE_OTHER): Payer: Medicare Other

## 2023-07-27 DIAGNOSIS — I480 Paroxysmal atrial fibrillation: Secondary | ICD-10-CM | POA: Diagnosis not present

## 2023-07-27 LAB — CUP PACEART REMOTE DEVICE CHECK
Date Time Interrogation Session: 20250529231759
Implantable Pulse Generator Implant Date: 20221212

## 2023-07-30 ENCOUNTER — Ambulatory Visit: Payer: Self-pay | Admitting: Cardiovascular Disease

## 2023-07-30 NOTE — Progress Notes (Signed)
 Carelink Summary Report / Loop Recorder

## 2023-08-27 ENCOUNTER — Ambulatory Visit (INDEPENDENT_AMBULATORY_CARE_PROVIDER_SITE_OTHER): Payer: Self-pay

## 2023-08-27 ENCOUNTER — Ambulatory Visit: Payer: Self-pay | Admitting: Cardiovascular Disease

## 2023-08-27 DIAGNOSIS — I480 Paroxysmal atrial fibrillation: Secondary | ICD-10-CM | POA: Diagnosis not present

## 2023-08-27 LAB — CUP PACEART REMOTE DEVICE CHECK
Date Time Interrogation Session: 20250629232911
Implantable Pulse Generator Implant Date: 20221212

## 2023-09-11 NOTE — Progress Notes (Signed)
 Carelink Summary Report / Loop Recorder

## 2023-09-27 ENCOUNTER — Ambulatory Visit (INDEPENDENT_AMBULATORY_CARE_PROVIDER_SITE_OTHER): Payer: Self-pay

## 2023-09-27 DIAGNOSIS — I480 Paroxysmal atrial fibrillation: Secondary | ICD-10-CM

## 2023-09-27 LAB — CUP PACEART REMOTE DEVICE CHECK
Date Time Interrogation Session: 20250730231740
Implantable Pulse Generator Implant Date: 20221212

## 2023-09-27 NOTE — Progress Notes (Signed)
 Carelink Summary Report / Loop Recorder

## 2023-09-29 ENCOUNTER — Ambulatory Visit: Payer: Self-pay | Admitting: Cardiovascular Disease

## 2023-10-29 ENCOUNTER — Ambulatory Visit (INDEPENDENT_AMBULATORY_CARE_PROVIDER_SITE_OTHER): Payer: Self-pay

## 2023-10-29 DIAGNOSIS — I480 Paroxysmal atrial fibrillation: Secondary | ICD-10-CM

## 2023-10-31 LAB — CUP PACEART REMOTE DEVICE CHECK
Date Time Interrogation Session: 20250830230710
Implantable Pulse Generator Implant Date: 20221212

## 2023-11-02 ENCOUNTER — Ambulatory Visit: Payer: Self-pay | Admitting: Cardiovascular Disease

## 2023-11-06 NOTE — Progress Notes (Signed)
 Remote Loop Recorder Transmission

## 2023-11-08 DIAGNOSIS — R54 Age-related physical debility: Secondary | ICD-10-CM | POA: Diagnosis not present

## 2023-11-08 DIAGNOSIS — G629 Polyneuropathy, unspecified: Secondary | ICD-10-CM | POA: Diagnosis not present

## 2023-11-08 DIAGNOSIS — Z1331 Encounter for screening for depression: Secondary | ICD-10-CM | POA: Diagnosis not present

## 2023-11-08 DIAGNOSIS — I5020 Unspecified systolic (congestive) heart failure: Secondary | ICD-10-CM | POA: Diagnosis not present

## 2023-11-08 DIAGNOSIS — K529 Noninfective gastroenteritis and colitis, unspecified: Secondary | ICD-10-CM | POA: Diagnosis not present

## 2023-11-08 DIAGNOSIS — Z23 Encounter for immunization: Secondary | ICD-10-CM | POA: Diagnosis not present

## 2023-11-08 DIAGNOSIS — H539 Unspecified visual disturbance: Secondary | ICD-10-CM | POA: Diagnosis not present

## 2023-11-08 DIAGNOSIS — N1832 Chronic kidney disease, stage 3b: Secondary | ICD-10-CM | POA: Diagnosis not present

## 2023-11-08 DIAGNOSIS — Z0000 Encounter for general adult medical examination without abnormal findings: Secondary | ICD-10-CM | POA: Diagnosis not present

## 2023-11-08 DIAGNOSIS — I129 Hypertensive chronic kidney disease with stage 1 through stage 4 chronic kidney disease, or unspecified chronic kidney disease: Secondary | ICD-10-CM | POA: Diagnosis not present

## 2023-11-08 DIAGNOSIS — Z85828 Personal history of other malignant neoplasm of skin: Secondary | ICD-10-CM | POA: Diagnosis not present

## 2023-11-08 DIAGNOSIS — N3946 Mixed incontinence: Secondary | ICD-10-CM | POA: Diagnosis not present

## 2023-11-26 NOTE — Progress Notes (Signed)
 Remote Loop Recorder Transmission

## 2023-11-29 ENCOUNTER — Ambulatory Visit (INDEPENDENT_AMBULATORY_CARE_PROVIDER_SITE_OTHER): Payer: Self-pay

## 2023-11-29 DIAGNOSIS — I480 Paroxysmal atrial fibrillation: Secondary | ICD-10-CM

## 2023-11-29 LAB — CUP PACEART REMOTE DEVICE CHECK
Date Time Interrogation Session: 20250930232254
Implantable Pulse Generator Implant Date: 20221212

## 2023-11-29 NOTE — Progress Notes (Signed)
 Remote Loop Recorder Transmission

## 2023-11-30 ENCOUNTER — Ambulatory Visit: Payer: Self-pay | Admitting: Cardiovascular Disease

## 2023-12-31 ENCOUNTER — Ambulatory Visit

## 2023-12-31 DIAGNOSIS — I480 Paroxysmal atrial fibrillation: Secondary | ICD-10-CM | POA: Diagnosis not present

## 2024-01-02 LAB — CUP PACEART REMOTE DEVICE CHECK
Date Time Interrogation Session: 20251102231731
Implantable Pulse Generator Implant Date: 20221212

## 2024-01-03 ENCOUNTER — Ambulatory Visit: Payer: Self-pay | Admitting: Cardiovascular Disease

## 2024-01-03 NOTE — Progress Notes (Signed)
 Remote Loop Recorder Transmission

## 2024-01-31 ENCOUNTER — Ambulatory Visit: Payer: Self-pay | Admitting: Cardiovascular Disease

## 2024-01-31 ENCOUNTER — Ambulatory Visit: Attending: Cardiovascular Disease

## 2024-01-31 LAB — CUP PACEART REMOTE DEVICE CHECK
Date Time Interrogation Session: 20251203231851
Implantable Pulse Generator Implant Date: 20221212

## 2024-02-06 NOTE — Progress Notes (Signed)
 Remote Loop Recorder Transmission

## 2024-03-02 ENCOUNTER — Ambulatory Visit: Attending: Cardiovascular Disease

## 2024-03-02 DIAGNOSIS — I480 Paroxysmal atrial fibrillation: Secondary | ICD-10-CM

## 2024-03-03 LAB — CUP PACEART REMOTE DEVICE CHECK
Date Time Interrogation Session: 20260103230719
Implantable Pulse Generator Implant Date: 20221212

## 2024-03-04 ENCOUNTER — Ambulatory Visit: Payer: Self-pay | Admitting: Cardiovascular Disease

## 2024-03-06 NOTE — Progress Notes (Signed)
 Remote Loop Recorder Transmission

## 2024-03-25 ENCOUNTER — Emergency Department (HOSPITAL_COMMUNITY)

## 2024-03-25 ENCOUNTER — Emergency Department (HOSPITAL_COMMUNITY)
Admission: EM | Admit: 2024-03-25 | Discharge: 2024-03-25 | Disposition: A | Discharge status: Skilled Nursing Facility | Attending: Emergency Medicine | Admitting: Emergency Medicine

## 2024-03-25 DIAGNOSIS — Z7901 Long term (current) use of anticoagulants: Secondary | ICD-10-CM | POA: Insufficient documentation

## 2024-03-25 DIAGNOSIS — S0101XA Laceration without foreign body of scalp, initial encounter: Secondary | ICD-10-CM | POA: Diagnosis not present

## 2024-03-25 DIAGNOSIS — I10 Essential (primary) hypertension: Secondary | ICD-10-CM | POA: Insufficient documentation

## 2024-03-25 DIAGNOSIS — Y92129 Unspecified place in nursing home as the place of occurrence of the external cause: Secondary | ICD-10-CM | POA: Insufficient documentation

## 2024-03-25 DIAGNOSIS — W01198A Fall on same level from slipping, tripping and stumbling with subsequent striking against other object, initial encounter: Secondary | ICD-10-CM | POA: Insufficient documentation

## 2024-03-25 DIAGNOSIS — W19XXXA Unspecified fall, initial encounter: Secondary | ICD-10-CM

## 2024-03-25 DIAGNOSIS — J449 Chronic obstructive pulmonary disease, unspecified: Secondary | ICD-10-CM | POA: Insufficient documentation

## 2024-03-25 DIAGNOSIS — Z79899 Other long term (current) drug therapy: Secondary | ICD-10-CM | POA: Diagnosis not present

## 2024-03-25 DIAGNOSIS — S0990XA Unspecified injury of head, initial encounter: Secondary | ICD-10-CM | POA: Diagnosis present

## 2024-03-25 NOTE — Discharge Instructions (Signed)
 Your imaging studies did not show any major injuries.  A glue was placed over the damaged skin on the back of your scalp.  This should wash off over the next several days.  Take Tylenol  as needed for pain and soreness.  Return to the emergency department for any new or worsening symptoms of concern.

## 2024-03-25 NOTE — Progress Notes (Signed)
"  Pt in CT at this time     "

## 2024-03-25 NOTE — ED Triage Notes (Signed)
 Pt BIBA from assisted living. Fall while attempting to get dish out of microwave. Hit the back of head. Lac with hematoma. Pt on eliquis . Aox4, GCS 15. Pt in no pain at this time.

## 2024-03-25 NOTE — ED Notes (Signed)
 Labs drawn and at bedside. No lab orders placed by provider at this time

## 2024-03-25 NOTE — ED Notes (Signed)
 Called CT scan to bring pt over for Level 2 trauma scans. No answer

## 2024-03-25 NOTE — ED Provider Notes (Signed)
 " [name] Provider Note   CSN: [id number] Arrival date & time: 03/25/24  1833     Patient presents with: [name] is a 89 y.o. female.    Fall  Patient presents after a fall.  Medical history includes arthritis, anemia, COPD, depression, GERD, HTN, osteoporosis.  She is on Eliquis .  Earlier today, she was in her normal state of health.  At her independent living facility, she was retrieving something from the microwave.  She had a loss of balance causing her to fall backwards.  She did strike the back of her head.  She arrives via EMS.  Patient currently denies any areas of discomfort.     Prior to Admission medications  Medication Sig Start Date End Date Taking? Authorizing Provider  acetaminophen  (TYLENOL ) 650 MG CR tablet Take 650 mg by mouth every 8 (eight) hours as needed for pain.    [provider]  apixaban  (ELIQUIS ) 2.5 MG TABS tablet Take 1 tablet (2.5 mg total) by mouth 2 (two) times daily. 03/13/23   Pietro Redell RAMAN, MD  benzonatate  (TESSALON ) 100 MG capsule Take 1 capsule (100 mg total) by mouth every 8 (eight) hours. 03/23/23   Kingsley, Victoria K, DO  carvedilol  (COREG ) 6.25 MG tablet Take 6.25 mg by mouth 2 (two) times daily. 11/15/20   [provider]  feeding supplement (BOOST HIGH PROTEIN) LIQD Take 1 Container by mouth daily.    [provider]  hydrochlorothiazide  (HYDRODIURIL ) 12.5 MG tablet Take 12.5 mg by mouth daily.    [provider]  loperamide  (IMODIUM ) 2 MG capsule Take 1 capsule (2 mg total) by mouth 4 (four) times daily as needed for diarrhea or loose stools. Patient taking differently: Take 4 mg by mouth 2 (two) times daily before a meal. 12/21/20   Raford Lenis, MD  loratadine  (CLARITIN ) 10 MG tablet Take 1 tablet (10 mg total) by mouth daily. Patient not taking: Reported on 03/24/2023 12/31/20   Will Almarie MATSU, MD  Multiple Vitamin (MULTIVITAMIN WITH  MINERALS) TABS tablet Take 1 tablet by mouth daily. 12/25/18   Shona Terry SAILOR, DO    Allergies: Amoxicillin -pot clavulanate, Cephalexin, Ciprofloxacin, Hydromorphone, Lisinopril, Mirtazapine, Nitrofuran derivatives, Septra [bactrim], and Sulfamethoxazole-trimethoprim    Review of Systems  Skin:  Positive for wound.  All other systems reviewed and are negative.   Updated Vital Signs BP (!) 171/91   Pulse 61   Temp (!) 97.4 F (36.3 C) (Oral)   Resp 16   Ht 5' 5 (1.651 m)   Wt 47.2 kg   SpO2 99%   BMI 17.31 kg/m   Physical Exam Vitals and nursing note reviewed.  Constitutional:      General: She is not in acute distress.    Appearance: Normal appearance. She is well-developed. She is not ill-appearing, toxic-appearing or diaphoretic.  HENT:     Head: Normocephalic.     Comments: Hematoma on left occiput with small area of macerated skin overlying.  Currently hemostatic.    Right Ear: External ear normal.     Left Ear: External ear normal.     Nose: Nose normal.     Mouth/Throat:     Mouth: Mucous membranes are moist.  Eyes:     Extraocular Movements: Extraocular movements intact.     Conjunctiva/sclera: Conjunctivae normal.  Cardiovascular:     Rate and Rhythm: Normal rate and regular rhythm.  Pulmonary:     Effort: Pulmonary  effort is normal. No respiratory distress.  Chest:     Chest wall: No tenderness.  Abdominal:     General: There is no distension.     Palpations: Abdomen is soft.     Tenderness: There is no abdominal tenderness.  Musculoskeletal:        General: No swelling or deformity. Normal range of motion.     Cervical back: Normal range of motion and neck supple.  Skin:    General: Skin is warm and dry.     Coloration: Skin is not jaundiced or pale.  Neurological:     General: No focal deficit present.     Mental Status: She is alert and oriented to person, place, and time.  Psychiatric:        Mood and Affect: Mood normal.        Behavior:  Behavior normal.     (all labs ordered are listed, but only abnormal results are displayed) Labs Reviewed - No data to display  EKG: None  Radiology:     .Laceration Repair  Date/Time: 03/25/2024 9:13 PM  Performed by: Melvenia Motto, MD Authorized by: Melvenia Motto, MD   Consent:    Consent obtained:  Verbal   Consent given by:  Patient   Risks, benefits, and alternatives were discussed: yes     Risks discussed:  Pain and infection Universal protocol:    Procedure explained and questions answered to patient or proxy's satisfaction: yes     Imaging studies available: yes     Patient identity confirmed:  Verbally with patient Anesthesia:    Anesthesia method:  None Laceration details:    Location:  Scalp   Scalp location:  Occipital   Length (cm):  1   Depth (mm):  1 Pre-procedure details:    Preparation:  Imaging obtained to evaluate for foreign bodies Treatment:    Area cleansed with:  Saline   Amount of cleaning:  Standard Skin repair:    Repair method:  Tissue adhesive Repair type:    Repair type:  Simple Post-procedure details:    Dressing:  Open (no dressing)   Procedure completion:  Tolerated well, no immediate complications    Medications Ordered in the ED - No data to display                                  Medical Decision Making Amount and/or Complexity of Data Reviewed Radiology: ordered.   This patient presents to the ED for concern of fall, this involves an extensive number of treatment options, and is a complaint that carries with it a high risk of complications and morbidity.  The differential diagnosis includes acute injuries   Co morbidities / Chronic conditions that complicate the patient evaluation  arthritis, anemia, COPD, depression, GERD, HTN, osteoporosis   Additional history obtained:  Additional history obtained from EMR External records from outside source obtained and reviewed including EMS  Imaging Studies ordered:  I  ordered imaging studies including x-ray of chest and pelvis; CT of head and cervical spine I independently visualized and interpreted imaging which showed no acute findings I agree with the radiologist interpretation   Cardiac Monitoring: / EKG:  The patient was maintained on a cardiac monitor.  I personally viewed and interpreted the cardiac monitored which showed an underlying rhythm of: Sinus rhythm   Problem List / ED Course / Critical interventions / Medication management  Patient presenting  after mechanical fall.  During the fall, she did strike the back of her head.  This occurred at her independent living facility.  She arrives via EMS with no complaints on arrival.  EMS did note hematoma with small area of macerated skin to the left occipital scalp.  Wound is currently hemostatic.  Patient is well-appearing on exam.  She has no areas of deformities.  She has no chest or abdominal tenderness.  She has good range of motion in her extremities.  Imaging studies were ordered.  On reassessment, patient resting comfortably.  Imaging studies did not show any acute findings.  Patient underwent cleansing and Dermabond of area of macerated skin on scalp.  She is stable for discharge.  Social Determinants of Health:  Resides in independent living facility     Final diagnoses:  Fall, initial encounter    ED Discharge Orders     None          Melvenia Motto, MD 03/25/24 2115  "

## 2024-04-02 ENCOUNTER — Ambulatory Visit

## 2024-04-02 LAB — CUP PACEART REMOTE DEVICE CHECK
Date Time Interrogation Session: 20260203231622
Implantable Pulse Generator Implant Date: 20221212

## 2024-05-03 ENCOUNTER — Ambulatory Visit

## 2024-06-03 ENCOUNTER — Ambulatory Visit

## 2024-07-04 ENCOUNTER — Ambulatory Visit
